# Patient Record
Sex: Female | Born: 1940 | Race: White | Hispanic: No | Marital: Single | Smoking: Former smoker
Health system: Southern US, Community
[De-identification: ages and names within clinical notes are randomized; demographics above are authoritative.]

## PROBLEM LIST (undated history)

## (undated) DIAGNOSIS — I10 Essential (primary) hypertension: Secondary | ICD-10-CM

## (undated) DIAGNOSIS — Z8719 Personal history of other diseases of the digestive system: Secondary | ICD-10-CM

## (undated) DIAGNOSIS — J449 Chronic obstructive pulmonary disease, unspecified: Secondary | ICD-10-CM

## (undated) DIAGNOSIS — I509 Heart failure, unspecified: Secondary | ICD-10-CM

## (undated) DIAGNOSIS — Z8711 Personal history of peptic ulcer disease: Secondary | ICD-10-CM

## (undated) DIAGNOSIS — C801 Malignant (primary) neoplasm, unspecified: Secondary | ICD-10-CM

## (undated) DIAGNOSIS — J189 Pneumonia, unspecified organism: Secondary | ICD-10-CM

## (undated) DIAGNOSIS — I2510 Atherosclerotic heart disease of native coronary artery without angina pectoris: Secondary | ICD-10-CM

## (undated) HISTORY — DX: Personal history of peptic ulcer disease: Z87.11

## (undated) HISTORY — DX: Pneumonia, unspecified organism: J18.9

## (undated) HISTORY — PX: LUNG REMOVAL, PARTIAL: SHX233

## (undated) HISTORY — DX: Personal history of other diseases of the digestive system: Z87.19

## (undated) HISTORY — DX: Heart failure, unspecified: I50.9

---

## 2008-09-12 ENCOUNTER — Emergency Department: Payer: Self-pay | Admitting: Internal Medicine

## 2010-09-08 ENCOUNTER — Emergency Department: Payer: Self-pay | Admitting: Emergency Medicine

## 2014-05-28 ENCOUNTER — Observation Stay: Payer: Self-pay | Admitting: Internal Medicine

## 2014-05-28 LAB — URINALYSIS, COMPLETE
Bacteria: NONE SEEN
Bilirubin,UR: NEGATIVE
GLUCOSE, UR: NEGATIVE mg/dL (ref 0–75)
KETONE: NEGATIVE
LEUKOCYTE ESTERASE: NEGATIVE
NITRITE: NEGATIVE
PH: 6 (ref 4.5–8.0)
Protein: NEGATIVE
RBC,UR: 2 /HPF (ref 0–5)
Specific Gravity: 1.013 (ref 1.003–1.030)
Squamous Epithelial: 9
WBC UR: 4 /HPF (ref 0–5)

## 2014-05-28 LAB — COMPREHENSIVE METABOLIC PANEL
ALBUMIN: 4 g/dL (ref 3.4–5.0)
ALK PHOS: 101 U/L
AST: 14 U/L — AB (ref 15–37)
Anion Gap: 8 (ref 7–16)
BUN: 6 mg/dL — ABNORMAL LOW (ref 7–18)
Bilirubin,Total: 0.8 mg/dL (ref 0.2–1.0)
CALCIUM: 8.5 mg/dL (ref 8.5–10.1)
CHLORIDE: 104 mmol/L (ref 98–107)
CO2: 27 mmol/L (ref 21–32)
Creatinine: 0.82 mg/dL (ref 0.60–1.30)
EGFR (African American): >60
GLUCOSE: 101 mg/dL — AB (ref 65–99)
Osmolality: 275 (ref 275–301)
Potassium: 3.5 mmol/L (ref 3.5–5.1)
SGPT (ALT): 17 U/L
SODIUM: 139 mmol/L (ref 136–145)
Total Protein: 7.8 g/dL (ref 6.4–8.2)

## 2014-05-28 LAB — CBC WITH DIFFERENTIAL/PLATELET
BASOS ABS: 0.2 10*3/uL — AB (ref 0.0–0.1)
Basophil %: 1.5 %
EOS ABS: 0.2 10*3/uL (ref 0.0–0.7)
Eosinophil %: 2.4 %
HCT: 42.5 % (ref 35.0–47.0)
HGB: 14 g/dL (ref 12.0–16.0)
LYMPHS PCT: 19.8 %
Lymphocyte #: 2.1 10*3/uL (ref 1.0–3.6)
MCH: 32.6 pg (ref 26.0–34.0)
MCHC: 33 g/dL (ref 32.0–36.0)
MCV: 99 fL (ref 80–100)
Monocyte #: 0.8 x10 3/mm (ref 0.2–0.9)
Monocyte %: 7.9 %
NEUTROS ABS: 7.2 10*3/uL — AB (ref 1.4–6.5)
Neutrophil %: 68.4 %
Platelet: 273 10*3/uL (ref 150–440)
RBC: 4.3 10*6/uL (ref 3.80–5.20)
RDW: 13.3 % (ref 11.5–14.5)
WBC: 10.5 10*3/uL (ref 3.6–11.0)

## 2014-05-28 LAB — TROPONIN I

## 2014-05-29 LAB — BASIC METABOLIC PANEL
ANION GAP: 6 — AB (ref 7–16)
BUN: 13 mg/dL (ref 7–18)
CHLORIDE: 100 mmol/L (ref 98–107)
Calcium, Total: 8.5 mg/dL (ref 8.5–10.1)
Co2: 28 mmol/L (ref 21–32)
Creatinine: 0.85 mg/dL (ref 0.60–1.30)
EGFR (African American): >60
Glucose: 95 mg/dL (ref 65–99)
Osmolality: 268 (ref 275–301)
POTASSIUM: 4 mmol/L (ref 3.5–5.1)
Sodium: 134 mmol/L — ABNORMAL LOW (ref 136–145)

## 2014-05-29 LAB — CBC WITH DIFFERENTIAL/PLATELET
Basophil #: 0.1 10*3/uL (ref 0.0–0.1)
Basophil %: 1.2 %
EOS ABS: 0.5 10*3/uL (ref 0.0–0.7)
EOS PCT: 5.7 %
HCT: 38.6 % (ref 35.0–47.0)
HGB: 13.1 g/dL (ref 12.0–16.0)
Lymphocyte #: 2.4 10*3/uL (ref 1.0–3.6)
Lymphocyte %: 27 %
MCH: 33.4 pg (ref 26.0–34.0)
MCHC: 34 g/dL (ref 32.0–36.0)
MCV: 99 fL (ref 80–100)
MONO ABS: 0.7 x10 3/mm (ref 0.2–0.9)
Monocyte %: 7.5 %
Neutrophil #: 5.1 10*3/uL (ref 1.4–6.5)
Neutrophil %: 58.6 %
PLATELETS: 219 10*3/uL (ref 150–440)
RBC: 3.92 10*6/uL (ref 3.80–5.20)
RDW: 13.6 % (ref 11.5–14.5)
WBC: 8.7 10*3/uL (ref 3.6–11.0)

## 2014-07-23 DIAGNOSIS — R0602 Shortness of breath: Secondary | ICD-10-CM | POA: Insufficient documentation

## 2014-07-23 DIAGNOSIS — R600 Localized edema: Secondary | ICD-10-CM | POA: Insufficient documentation

## 2014-07-23 DIAGNOSIS — I447 Left bundle-branch block, unspecified: Secondary | ICD-10-CM | POA: Insufficient documentation

## 2014-08-13 DIAGNOSIS — I429 Cardiomyopathy, unspecified: Secondary | ICD-10-CM | POA: Insufficient documentation

## 2015-02-13 NOTE — Discharge Summary (Signed)
PATIENT NAME:  Felicia Sellers, Felicia Sellers MR#:  [id number] DATE OF BIRTH:  [birth year]  DATE OF ADMISSION:  05/28/2014 DATE OF DISCHARGE:  05/30/2014  ADMITTING PHYSICIAN:  Dr. Vianne Sellers.   DISCHARGING PHYSICIAN:  Felicia Sellers, M.D.   PRIMARY CARE PHYSICIAN: The patient has no primary care physician prior to admission, however, at the time of discharge she is being set up with [facility] and an appointment is set up with Felicia Sellers, nurse practitioner.    [place]: None.   DISCHARGE DIAGNOSES:  1.  Fall and left 6th rib fracture.  2.  Hypertension.  3.  Tobacco use disorder.   4.  History of lung cancer status post partial right lobectomy.  DISCHARGE HOME MEDICATIONS:  1.  Norco 5/325 mg 1 to 2 tablets every 6 hours as needed for moderate pain.  2.  Senokot 2 tablets once a day for constipation while taking pain medications.  3.  Tylenol 500 mg q. 6 hours p.r.n. for pain.  4.  Norvasc 10 mg p.o. daily.   DISCHARGE DIET: Low-sodium diet.   DISCHARGE ACTIVITY: As tolerated.   FOLLOWUP INSTRUCTIONS:  1.  PCP followup in 2-3 weeks as set up.  2.  Home health physical therapy, nurse and social worker.  3.  Advised to check blood pressure on each visit per home health nurse, call MD if systolic blood pressure greater than 170 consistently or less than 100 so medication adjustments can be made.   LABORATORY DATA PRIOR TO DISCHARGE: WBC 8.7, hemoglobin 13.1, hematocrit 38.6, platelet count 219,000, sodium 134, potassium 4.0, chloride 100, bicarbonate 28, BUN 13, creatinine 0.85, glucose 95, calcium of 8.5.   IMAGING STUDIES PRIOR TO DISCHARGE:  1.  Left hip x-ray showing no acute osseous abnormality identified, moderate bilateral hip osteoarthrosis noted.   2.  CT of the head without contrast showing no acute intracranial abnormality, chronic small vessel ischemic disease and slight atrophy noted.  3.  Left ankle x-ray showing no acute bony abnormality to  the left ankle.  4.  Chest x-ray showing acute fracture of posterolateral aspect of the left 6th rib.   BRIEF HOSPITAL COURSE: [name] is a 74 year old elderly Caucasian female with no significant past medical history other than remote history of lung cancer, status post partial lobectomy of the right lung in the past, comes in after a fall.  She hit her left chest wall and had severe pain.  1.  Fall and left 6th rib fracture. She was admitted mostly for pain control and worked well with physical therapy. Pain was able to be controlled with oral Tylenol and Norco with which she is being discharged. She tolerated the medication okay. She was constipated so was also started on laxatives along with her pain medications.  2.  Hypertension. No known history of blood pressure issues, however, her blood pressure persistently was very, very high in the hospital, most likely secondary to pain. She was started on low-dose Norvasc and the dose was increased up to 10 mg at the time of discharge. She is being discharged on 10 mg of Norvasc but home health will be set up so they can follow up on her blood pressure and call MD for medication adjustments. Primary Care Physician has been set up prior to discharge.   The patient's course has been otherwise uneventful in the hospital.   DISCHARGE CONDITION: Stable.   DISCHARGE DISPOSITION: Home with home health.   Time spent on discharge: 45 minutes.  ____________________________ Felicia Lighter, MD rk:lt D: 05/31/2014 11:29:48 ET T: 05/31/2014 11:40:35 ET JOB#: [id number]  cc: Felicia Lighter, MD, <Dictator> Felicia Sellers. Felicia Severin, FNP Felicia Lighter MD ELECTRONICALLY SIGNED 06/09/2014 14:15

## 2015-02-13 NOTE — H&P (Signed)
PATIENT NAME:  Felicia, Sellers MR#:  [id number] DATE OF BIRTH:  Mar 07, 1941  DATE OF ADMISSION:  05/28/2014  PRIMARY DOCTOR: None.   EMERGENCY ROOM PHYSICIAN: Dr. Corky Downs.   CHIEF COMPLAINT: Fall and left-sided pleuritic chest pain.   HISTORY OF PRESENT ILLNESS: A 74 year old female patient who lives by herself comes because of the fall and suffered left hip pain and also left-sided chest wall pain. The patient states that she felt like she got up too fast and then fell. The patient denies any loss of consciousness, denies any dizziness. The patient noted to have left-sided leg pain and also left-sided chest pain. The patient complains of chest wall pain on the left side and is noted to have a left rib fracture. She also says that she has pain in the left hip and unable to ambulate. Her labs are within normal limits except fracture of the left 6th rib. She will be admitted for observation status for pain management and possible physical therapy evaluation.   PAST MEDICAL HISTORY: No hypertension or diabetes. She has a history of lung cancer status post surgery of  the right lung.   ALLERGIES: No known allergies.   SOCIAL HISTORY: Smokes half pack per day. No alcohol. No drugs. The patient lives by herself and she says that her nephew used to live with her but he moved out recently with his girlfriend. The patient says that she does not use a walker or a cane and able to ambulate without any assistance and take care of her daily needs. She mentioned that she had fallen once, but she does not think she needs any help at home. This time she says she cannot go home and have this much pain, so she would rather stay overnight and have more pain control.   PAST SURGICAL HISTORY: Significant for lung cancer surgery and history of appendectomy.   FAMILY HISTORY: No hypertension or diabetes. Says that all of her family is deceased and she does not have children.    MEDICATIONS: None.   REVIEW OF  SYSTEMS: CONSTITUTIONAL: No fever. No fatigue.  EYES: She has glasses.  ENT: No tinnitus. No epistaxis. No difficulty swallowing.  CARDIOVASCULAR: The patient has no chest pain. No palpitations. No syncope.  PULMONARY: The patient denies any trouble breathing. Denies any history of COPD.   GASTROINTESTINAL: Denies nausea or vomiting. No abdominal pain.  GENITOURINARY: No dysuria.  ENDOCRINE: No polyuria. No polydipsia.  MUSCULOSKELETAL: Complains of pleuritic chest pain on the left side and left hip pain.  NEUROLOGIC: Denies any strokes or TIAs.  PSYCHIATRIC: No anxiety or insomnia.   PHYSICAL EXAMINATION: VITAL SIGNS: Temperature 97.6, heart rate is 74, blood pressure is 201/98 initially and then repeat 190/90, saturation 100% on room air.  GENERAL: Alert, awake, oriented, elderly female in slight distress because of the pain in the chest wall and also hip pain.  HEAD: Atraumatic and normocephalic.  EYES: PERRLA. EOM intact.  ENT: No tympanic membrane congestion. No turbinate hypertrophy. No oropharyngeal erythema.  NECK: Supple. No JVD. No carotid bruit.  CARDIOVASCULAR: S1, S2 regular. No murmurs. The patient's PMI is not displaced. Peripheral pulses are intact in carotid and dorsalis pedis.  LUNGS: Clear to auscultation. No wheeze. No rales. Not using accessory muscle respiration.  GASTROINTESTINAL: Abdomen is soft, nontender, nondistended. Bowel sounds present. The patient has no hernias.  GENITOURINARY: Deferred.  MUSCULOSKELETAL: Complains of pain with minimal movement of the left hip and left knee area and also has chest wall pain  on the left side, but the rest of the extremity movements are within normal limits. The patient has no cyanosis, no clubbing.  LYMPHATICS: No lymphadenopathy in cervical or axillary region.  NEUROLOGIC: Alert, awake, and oriented. Cranial nerves II through XII intact. Power 5/5 in upper and lower extremities. Sensory intact. DTRs 2+ bilaterally.   SKIN:  Warm and dry.  PSYCHIATRIC: Mood and affect are within normal limits.   LABORATORY DATA: CT head shows no acute abnormality, chronic small vessel ischemic changes. Chest x-ray shows acute fracture of the posterolateral aspect of the left 6th rib. , hemoglobin 14, hematocrit 42.5, platelets 273,000. Electrolytes: Sodium 139, potassium 3.5, chloride 104, bicarbonate 27, BUN 6, creatinine 0.82, glucose 101. LFTs within normal limits. Troponin less than 0.02.  4. CT head negative for any acute changes. She has atrophy.   ASSESSMENT AND PLAN:  1.  The patient is a 74 year old female with fall, suffered acute rib fractures. Admit to observation status for pain control and pulmonary toilet and get physical therapy.  2.  Hip pain on the left side. Get x-ray of the left hip to evaluate for any fracture, and physical therapy to follow the patient. Continue pain medications.   3.  Malignant hypertension, on hydralazine and also pain medicines and see how she does.   4. Tobacco abuse, counseled against smoking. The patient does not want to quit and consult for 3 minutes.  5.  Gastrointestinal prophylaxis and deep vein thrombosis prophylaxis.   TIME SPENT: About 55 minutes on this admission.    ____________________________ Epifanio Lesches, MD sk:at D: 05/28/2014 11:37:58 ET T: 05/28/2014 12:31:08 ET JOB#: [id number]  cc: Epifanio Lesches, MD, <Dictator> Epifanio Lesches MD ELECTRONICALLY SIGNED 06/30/2014 13:10

## 2015-03-10 ENCOUNTER — Other Ambulatory Visit: Payer: Self-pay | Admitting: Family Medicine

## 2015-03-10 DIAGNOSIS — Z13820 Encounter for screening for osteoporosis: Secondary | ICD-10-CM

## 2015-03-17 ENCOUNTER — Other Ambulatory Visit: Payer: Self-pay | Admitting: Family Medicine

## 2015-03-17 DIAGNOSIS — Z13820 Encounter for screening for osteoporosis: Secondary | ICD-10-CM

## 2015-03-23 ENCOUNTER — Other Ambulatory Visit: Payer: Self-pay | Admitting: Family Medicine

## 2015-03-23 DIAGNOSIS — Z1231 Encounter for screening mammogram for malignant neoplasm of breast: Secondary | ICD-10-CM

## 2015-04-01 ENCOUNTER — Other Ambulatory Visit: Payer: Self-pay | Admitting: Family Medicine

## 2015-04-01 ENCOUNTER — Ambulatory Visit
Admission: RE | Admit: 2015-04-01 | Discharge: 2015-04-01 | Disposition: A | Discharge status: Home / Self Care | Payer: Medicare Other | Source: Ambulatory Visit | Attending: Family Medicine | Admitting: Family Medicine

## 2015-04-01 DIAGNOSIS — Z13820 Encounter for screening for osteoporosis: Secondary | ICD-10-CM

## 2015-04-01 DIAGNOSIS — Z1231 Encounter for screening mammogram for malignant neoplasm of breast: Secondary | ICD-10-CM | POA: Insufficient documentation

## 2015-04-01 DIAGNOSIS — M810 Age-related osteoporosis without current pathological fracture: Secondary | ICD-10-CM | POA: Diagnosis not present

## 2015-04-01 HISTORY — DX: Malignant (primary) neoplasm, unspecified: C80.1

## 2015-04-02 ENCOUNTER — Other Ambulatory Visit: Payer: Self-pay | Admitting: Family Medicine

## 2015-04-02 DIAGNOSIS — R928 Other abnormal and inconclusive findings on diagnostic imaging of breast: Secondary | ICD-10-CM

## 2015-04-28 ENCOUNTER — Ambulatory Visit
Admission: RE | Admit: 2015-04-28 | Discharge: 2015-04-28 | Disposition: A | Discharge status: Home / Self Care | Payer: Medicare Other | Source: Ambulatory Visit | Attending: Family Medicine | Admitting: Family Medicine

## 2015-04-28 DIAGNOSIS — N63 Unspecified lump in breast: Secondary | ICD-10-CM | POA: Insufficient documentation

## 2015-04-28 DIAGNOSIS — R928 Other abnormal and inconclusive findings on diagnostic imaging of breast: Secondary | ICD-10-CM

## 2015-06-18 DIAGNOSIS — M545 Low back pain: Secondary | ICD-10-CM

## 2015-06-18 DIAGNOSIS — G8929 Other chronic pain: Secondary | ICD-10-CM | POA: Insufficient documentation

## 2015-11-04 DIAGNOSIS — E871 Hypo-osmolality and hyponatremia: Secondary | ICD-10-CM

## 2015-11-04 HISTORY — DX: Hypo-osmolality and hyponatremia: E87.1

## 2016-04-03 ENCOUNTER — Other Ambulatory Visit: Payer: Self-pay | Admitting: Family Medicine

## 2016-04-03 DIAGNOSIS — Z13820 Encounter for screening for osteoporosis: Secondary | ICD-10-CM

## 2016-11-30 ENCOUNTER — Other Ambulatory Visit: Payer: Self-pay | Admitting: Family Medicine

## 2016-11-30 DIAGNOSIS — R928 Other abnormal and inconclusive findings on diagnostic imaging of breast: Secondary | ICD-10-CM

## 2016-12-11 ENCOUNTER — Ambulatory Visit: Admission: RE | Admit: 2016-12-11 | Payer: Medicare Other | Source: Ambulatory Visit

## 2016-12-19 ENCOUNTER — Ambulatory Visit
Admission: RE | Admit: 2016-12-19 | Discharge: 2016-12-19 | Disposition: A | Discharge status: Home / Self Care | Payer: Medicare Other | Source: Ambulatory Visit | Attending: Family Medicine | Admitting: Family Medicine

## 2016-12-19 DIAGNOSIS — Z13820 Encounter for screening for osteoporosis: Secondary | ICD-10-CM | POA: Diagnosis present

## 2016-12-19 DIAGNOSIS — M818 Other osteoporosis without current pathological fracture: Secondary | ICD-10-CM | POA: Diagnosis not present

## 2016-12-21 DIAGNOSIS — M810 Age-related osteoporosis without current pathological fracture: Secondary | ICD-10-CM | POA: Insufficient documentation

## 2017-01-05 ENCOUNTER — Ambulatory Visit
Admission: RE | Admit: 2017-01-05 | Discharge: 2017-01-05 | Disposition: A | Discharge status: Home / Self Care | Payer: Medicare Other | Source: Ambulatory Visit | Attending: Family Medicine | Admitting: Family Medicine

## 2017-01-05 DIAGNOSIS — R928 Other abnormal and inconclusive findings on diagnostic imaging of breast: Secondary | ICD-10-CM

## 2017-01-05 DIAGNOSIS — N6312 Unspecified lump in the right breast, upper inner quadrant: Secondary | ICD-10-CM | POA: Diagnosis not present

## 2017-01-05 DIAGNOSIS — N6324 Unspecified lump in the left breast, lower inner quadrant: Secondary | ICD-10-CM | POA: Insufficient documentation

## 2017-06-04 ENCOUNTER — Other Ambulatory Visit: Payer: Self-pay | Admitting: Family Medicine

## 2017-06-04 DIAGNOSIS — R928 Other abnormal and inconclusive findings on diagnostic imaging of breast: Secondary | ICD-10-CM

## 2017-06-05 ENCOUNTER — Other Ambulatory Visit: Payer: Self-pay | Admitting: Family Medicine

## 2017-06-05 DIAGNOSIS — R928 Other abnormal and inconclusive findings on diagnostic imaging of breast: Secondary | ICD-10-CM

## 2017-07-10 ENCOUNTER — Ambulatory Visit
Admission: RE | Admit: 2017-07-10 | Discharge: 2017-07-10 | Disposition: A | Discharge status: Home / Self Care | Payer: Medicare Other | Source: Ambulatory Visit | Attending: Family Medicine | Admitting: Family Medicine

## 2017-07-10 DIAGNOSIS — R928 Other abnormal and inconclusive findings on diagnostic imaging of breast: Secondary | ICD-10-CM | POA: Diagnosis not present

## 2017-12-06 ENCOUNTER — Other Ambulatory Visit: Payer: Self-pay | Admitting: Family Medicine

## 2017-12-06 DIAGNOSIS — R928 Other abnormal and inconclusive findings on diagnostic imaging of breast: Secondary | ICD-10-CM

## 2018-01-07 ENCOUNTER — Ambulatory Visit
Admission: RE | Admit: 2018-01-07 | Discharge: 2018-01-07 | Disposition: A | Discharge status: Home / Self Care | Payer: Medicare Other | Source: Ambulatory Visit | Attending: Family Medicine | Admitting: Family Medicine

## 2018-01-07 DIAGNOSIS — R928 Other abnormal and inconclusive findings on diagnostic imaging of breast: Secondary | ICD-10-CM

## 2018-06-25 ENCOUNTER — Other Ambulatory Visit: Payer: Self-pay | Admitting: Family Medicine

## 2018-06-25 DIAGNOSIS — R1013 Epigastric pain: Secondary | ICD-10-CM

## 2018-06-25 DIAGNOSIS — R1010 Upper abdominal pain, unspecified: Secondary | ICD-10-CM

## 2018-06-25 DIAGNOSIS — R1011 Right upper quadrant pain: Secondary | ICD-10-CM

## 2018-07-03 ENCOUNTER — Ambulatory Visit: Payer: Medicare Other

## 2018-07-08 ENCOUNTER — Ambulatory Visit
Admission: RE | Admit: 2018-07-08 | Discharge: 2018-07-08 | Disposition: A | Discharge status: Home / Self Care | Payer: Medicare Other | Source: Ambulatory Visit | Attending: Family Medicine | Admitting: Family Medicine

## 2018-07-08 DIAGNOSIS — R1011 Right upper quadrant pain: Secondary | ICD-10-CM

## 2018-07-08 DIAGNOSIS — I714 Abdominal aortic aneurysm, without rupture: Secondary | ICD-10-CM | POA: Diagnosis not present

## 2018-07-08 DIAGNOSIS — R1013 Epigastric pain: Secondary | ICD-10-CM | POA: Diagnosis present

## 2018-07-08 DIAGNOSIS — R1010 Upper abdominal pain, unspecified: Secondary | ICD-10-CM

## 2018-07-17 ENCOUNTER — Other Ambulatory Visit: Payer: Self-pay

## 2018-07-17 ENCOUNTER — Other Ambulatory Visit: Payer: Self-pay | Admitting: Family Medicine

## 2018-07-17 DIAGNOSIS — I714 Abdominal aortic aneurysm, without rupture, unspecified: Secondary | ICD-10-CM

## 2018-07-24 ENCOUNTER — Other Ambulatory Visit
Admission: RE | Admit: 2018-07-24 | Discharge: 2018-07-24 | Disposition: A | Discharge status: Home / Self Care | Payer: Medicare Other | Source: Ambulatory Visit | Attending: Family Medicine | Admitting: Family Medicine

## 2018-07-24 ENCOUNTER — Ambulatory Visit
Admission: RE | Admit: 2018-07-24 | Discharge: 2018-07-24 | Disposition: A | Discharge status: Home / Self Care | Payer: Medicare Other | Source: Ambulatory Visit | Attending: Family Medicine | Admitting: Family Medicine

## 2018-07-24 DIAGNOSIS — K5790 Diverticulosis of intestine, part unspecified, without perforation or abscess without bleeding: Secondary | ICD-10-CM | POA: Diagnosis not present

## 2018-07-24 DIAGNOSIS — I714 Abdominal aortic aneurysm, without rupture, unspecified: Secondary | ICD-10-CM

## 2018-07-24 DIAGNOSIS — N83291 Other ovarian cyst, right side: Secondary | ICD-10-CM | POA: Diagnosis not present

## 2018-07-24 DIAGNOSIS — I708 Atherosclerosis of other arteries: Secondary | ICD-10-CM | POA: Insufficient documentation

## 2018-07-24 DIAGNOSIS — N83292 Other ovarian cyst, left side: Secondary | ICD-10-CM | POA: Diagnosis not present

## 2018-07-24 HISTORY — DX: Essential (primary) hypertension: I10

## 2018-07-24 LAB — BASIC METABOLIC PANEL
Anion gap: 7 (ref 5–15)
BUN: 15 mg/dL (ref 8–23)
CALCIUM: 9 mg/dL (ref 8.9–10.3)
CO2: 24 mmol/L (ref 22–32)
CREATININE: 0.98 mg/dL (ref 0.44–1.00)
Chloride: 101 mmol/L (ref 98–111)
GFR calc non Af Amer: 53 mL/min — ABNORMAL LOW (ref >60)
Glucose, Bld: 88 mg/dL (ref 70–99)
Potassium: 4 mmol/L (ref 3.5–5.1)
SODIUM: 132 mmol/L — AB (ref 135–145)

## 2018-07-24 MED ORDER — IOPAMIDOL (ISOVUE-370) INJECTION 76%
100.0000 mL | Freq: Once | INTRAVENOUS | Status: AC | PRN
  Administered 2018-07-24: 100 mL via INTRAVENOUS

## 2018-08-06 ENCOUNTER — Encounter (INDEPENDENT_AMBULATORY_CARE_PROVIDER_SITE_OTHER): Payer: Medicare Other | Admitting: Vascular Surgery

## 2018-08-06 NOTE — Progress Notes (Signed)
Gynecologic Oncology Consult Visit   Referring Provider: Zeb Comfort, MD [address] [place], [place] [address] [phone]  Chief Concern: Bilateral ovarian masses  Subjective:  Felicia Sellers is a 77 y.o. female who is seen in consultation from Dr. Pricilla Riffle for bilateral ovarian masses.  US Abdomen 07/08/2018 IMPRESSION: 1. Tiny stone versus tumefactive sludge within the gallbladder. Negative for acute cholecystitis or biliary dilatation 2. 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 Recommend CTA to more thoroughly evaluate the aorta.  CT scan 07/24/2018 Bilateral heterogeneous partially cystic and septated ovarian masses. Left ovarian complex mass measures 10.6 x 6.1 cm. Right ovarian mass measures 4.5 x 2.8 cm. No ascites or evidence of peritoneal disease by CT.   Atherosclerotic disease involving celiac, SMA, renals, IMA, and iliac vessels.   Diverticulosis without acute inflammatory process  Today she complains of weight gain and abdominal pressure. She continues to smoke and reports history of lung cancer but has not seen pulmonology in 'long time; can't even remember his name'. She is scheduled to see vascular, Dr. Lucky Cowboy, for thoracic aneurysm and other findings on ct scan. She previously drank alcohol daily but quit ~ 6 months ago. She has chronic shortness of breath and leg pain with walking. She has chronic cough. She has history of kyphosis but reports chronic back and joint pain.   Problem List: Patient Active Problem List   Diagnosis Date Noted  . Kyphosis 08/07/2018  . Hypertension 08/07/2018  . Tobacco use 08/07/2018  . Age-related osteoporosis without current pathological fracture 12/21/2016  . Chronic hyponatremia 11/04/2015  . Chronic low back pain 06/18/2015  . Cardiomyopathy ([place]) 08/13/2014  . Left bundle branch block (LBBB) 07/23/2014  . Pedal edema 07/23/2014  . SOB (shortness of breath) on exertion  07/23/2014    Past Medical History: Past Medical History:  Diagnosis Date  . Cancer ([place])    right lung removed - lung cancer  . History of stomach ulcers   . Hypertension     Past Surgical History: Past Surgical History:  Procedure Laterality Date  . LUNG REMOVAL, PARTIAL Left     Past Gynecologic History:  G1P4 Distant history of birth control pill use Denies history of STIs or abnormal pap smears  OB History:  OB History  No data available    Family History: Family History  Problem Relation Age of Onset  . Breast cancer Neg Hx     Social History: Social History   Socioeconomic History  . Marital status: Single    Spouse name: Not on file  . Number of children: Not on file  . Years of education: Not on file  . Highest education level: Not on file  Occupational History  . Not on file  Social Needs  . Financial resource strain: Not on file  . Food insecurity:    Worry: Not on file    Inability: Not on file  . Transportation needs:    Medical: Not on file    Non-medical: Not on file  Tobacco Use  . Smoking status: Current Every Day Smoker    Packs/day: 1.00    Years: 66.00    Pack years: 66.00  . Smokeless tobacco: Never Used  Substance and Sexual Activity  . Alcohol use: Not Currently    Comment: use to drink alot ( vodka) 6 months ago  . Drug use: Never  . Sexual activity: Not Currently  Lifestyle  . Physical activity:  Days per week: Not on file    Minutes per session: Not on file  . Stress: Not on file  Relationships  . Social connections:    Talks on phone: Not on file    Gets together: Not on file    Attends religious service: Not on file    Active member of club or organization: Not on file    Attends meetings of clubs or organizations: Not on file    Relationship status: Not on file  . Intimate partner violence:    Fear of current or ex partner: Not on file    Emotionally abused: Not on file    Physically abused: Not on file     Forced sexual activity: Not on file  Other Topics Concern  . Not on file  Social History Narrative  . Not on file    Allergies: No Known Allergies  Current Medications: Current Outpatient Medications  Medication Sig Dispense Refill  . Albuterol Sulfate 108 (90 Base) MCG/ACT AEPB Inhale 2 puffs into the lungs every 6 (six) hours as needed.    Marland Kitchen alendronate (FOSAMAX) 70 MG tablet Take 70 mg by mouth once a week. Take with a full glass of water on an empty stomach.    Marland Kitchen amLODipine (NORVASC) 10 MG tablet Take 10 mg by mouth daily.    Marland Kitchen atorvastatin (LIPITOR) 40 MG tablet Take 40 mg by mouth daily.    . calcium carbonate (OS-CAL - DOSED IN MG OF ELEMENTAL CALCIUM) 1250 (500 Ca) MG tablet Take 1 tablet by mouth.    . Cholecalciferol 1000 units capsule Take 1,000 Units by mouth daily.    Marland Kitchen docusate sodium (COLACE) 100 MG capsule Take 100 mg by mouth daily.    Marland Kitchen gabapentin (NEURONTIN) 300 MG capsule Take 300 mg by mouth 3 (three) times daily.    Marland Kitchen lisinopril (PRINIVIL,ZESTRIL) 5 MG tablet Take 5 mg by mouth daily.    . metoprolol succinate (TOPROL-XL) 25 MG 24 hr tablet Take 25 mg by mouth daily.    . pantoprazole (PROTONIX) 40 MG tablet Take 40 mg by mouth daily.     No current facility-administered medications for this visit.     Review of Systems General: negative for fevers, chills, fatigue, changes in sleep, or appetite Skin: negative for changes in color, texture, moles or lesions Eyes: negative for changes in vision, pain, diplopia HEENT: negative for change in hearing, pain, discharge, tinnitus, vertigo, voice changes, sore throat, neck masses Breasts: negative for breast lumps Pulmonary: negative for dyspnea, orthopnea. Hx of partial lung removal. Chronic cough & COPD Cardiac: negative for palpitations, syncope, pain, discomfort, pressure.  Gastrointestinal: negative for dysphagia, nausea, vomiting, jaundice, pain, constipation, diarrhea, hematemesis,  hematochezia Genitourinary/Sexual: negative for dysuria, discharge, hesitancy, nocturia, retention, stones, infections, STD's, incontinence Ob/Gyn: negative for irregular bleeding, pain Musculoskeletal: negative for pain, stiffness, swelling. Chronic limited ROM in neck and upper back.  Hematology: negative for easy bruising, bleeding Neurologic/Psych: negative for headaches, seizures, paralysis, weakness, tremor, change in gait, change in sensation, mood swings, depression, anxiety, change in memory   Objective:  Physical Examination:  BP 115/70   Pulse 75   Temp (!) 96.7 F (35.9 C) (Tympanic)   Resp 18   Ht 5\' 3"  (1.6 m)   Wt 136 lb 8 oz (61.9 kg)   BMI 24.18 kg/m    ECOG Performance Status: 2 - Symptomatic, <50% confined to bed  GENERAL: chronically ill appearing, no acute distress. Unaccompanied.  HEENT:  Sclerae  anicteric.  Oropharynx clear and moist. No ulcerations or evidence of oropharyngeal candidiasis. Neck is supple.  NODES:  No cervical, supraclavicular, or axillary lymphadenopathy palpated.  LUNGS:  Bilateral rhonchi, worse lung bases HEART:  Regular rate and rhythm. No murmur appreciated. ABDOMEN:  Soft, nontender.  Positive, normoactive bowel sounds. No organomegaly palpated. MSK:  No focal spinal tenderness to palpation. Full range of motion bilaterally in the upper extremities. Kyphosis.  EXTREMITIES:  No peripheral edema.   SKIN:  Clear with no obvious rashes or skin changes. No nail dyscrasia. NEURO:  Nonfocal. Well oriented.  Appropriate affect.  Pelvic: exam chaperoned by nurse;  Vulva: normal appearing vulva with no masses, tenderness or lesions; Vagina: normal vagina; Adnexa: right adnexal fullness and mass present left side, size firm and 5-6 cm along the base of the mass, unable to palpate the entire lesion; Uterus: uterus is normal size, shape, consistency and nontender; Cervix: no lesions; Rectal: confirmatory  Lab Review Labs on site today: CA-125 and  HE4 - pending   Radiologic Imaging: As per HPI    Assessment:  Felicia Sellers is a 78 y.o. female diagnosed with symptomatic bilateral adnexal masses concerning for malignancy (Partially cystic and septated ovarian masses. Left ovarian complex mass measures 10.6 x 6.1 cm. Right ovarian mass measures 4.5 x 2.8 cm). No ascites which is reassuring. She has significant co-morbid conditions and is not a good surgical candidate.   Medical co-morbidities complicating care: HTN, Tobacco use, Cardiomyopathy ([place]), s/p lung resection, diffuse atherosclerosis, and abdominal aortic aneurysm up to 4.6 cm.  Plan:   Problem List Items Addressed This Visit    None    Visit Diagnoses    Ovarian mass    -  Primary   Relevant Orders   CA 125   Human Epididymis Prot 4,Serial   CT GUIDED NEEDLE PLACEMENT      We discussed options for management and given that she is a poor surgical candidate plan for tumor marker assessment and CT guided biopsy. If malignant plan for chemotherapy. If not malignant will need to discuss surgical intervention and address her co-morbid conditions. She will follow up with her other care providers regarding her other health problems.   Suggested return to clinic in  1-2 weeks after we have results.   The patient's diagnosis, an outline of the further diagnostic and laboratory studies which will be required, the recommendation, and alternatives were discussed.  All questions were answered to the patient's satisfaction.  I personally had a face to face interaction and evaluated the patient jointly with the NP, Ms. Beckey Rutter.  I have reviewed her history and available records and have performed the key portions of the physical exam including lymph node survey, abdominal exam, pelvic exam with my findings confirming those documented above by the APP.  I have discussed the case with the APP and the patient.  I agree with the above documentation, assessment and plan which was fully  formulated by me.  Counseling was completed by me.   I personally saw the patient and performed a substantive portion of this encounter in conjunction with the listed APP as documented above. A total of 80 minutes were spent with the patient/family today; 50% was spent in education, counseling and coordination of care for symptomatic bilateral adnexal masses concerning for malignancy.   Kasean Denherder Gaetana Michaelis, MD      CC:  Zeb Comfort, MD [address] [place], [place] [address] [phone]

## 2018-08-07 ENCOUNTER — Inpatient Hospital Stay: Payer: Medicare Other | Attending: Obstetrics and Gynecology | Admitting: Obstetrics and Gynecology

## 2018-08-07 ENCOUNTER — Inpatient Hospital Stay: Payer: Medicare Other

## 2018-08-07 VITALS — BP 115/70 | HR 75 | Temp 96.7°F | Resp 18 | Ht 63.0 in | Wt 136.5 lb

## 2018-08-07 DIAGNOSIS — Z1273 Encounter for screening for malignant neoplasm of ovary: Secondary | ICD-10-CM | POA: Diagnosis not present

## 2018-08-07 DIAGNOSIS — D3912 Neoplasm of uncertain behavior of left ovary: Secondary | ICD-10-CM | POA: Diagnosis present

## 2018-08-07 DIAGNOSIS — F17210 Nicotine dependence, cigarettes, uncomplicated: Secondary | ICD-10-CM | POA: Insufficient documentation

## 2018-08-07 DIAGNOSIS — Z85118 Personal history of other malignant neoplasm of bronchus and lung: Secondary | ICD-10-CM | POA: Diagnosis not present

## 2018-08-07 DIAGNOSIS — D3911 Neoplasm of uncertain behavior of right ovary: Secondary | ICD-10-CM | POA: Insufficient documentation

## 2018-08-07 DIAGNOSIS — R971 Elevated cancer antigen 125 [CA 125]: Secondary | ICD-10-CM | POA: Insufficient documentation

## 2018-08-07 DIAGNOSIS — Z720 Tobacco use: Secondary | ICD-10-CM | POA: Insufficient documentation

## 2018-08-07 DIAGNOSIS — N838 Other noninflammatory disorders of ovary, fallopian tube and broad ligament: Secondary | ICD-10-CM

## 2018-08-07 DIAGNOSIS — I10 Essential (primary) hypertension: Secondary | ICD-10-CM | POA: Insufficient documentation

## 2018-08-07 DIAGNOSIS — C801 Malignant (primary) neoplasm, unspecified: Secondary | ICD-10-CM | POA: Insufficient documentation

## 2018-08-07 DIAGNOSIS — M40209 Unspecified kyphosis, site unspecified: Secondary | ICD-10-CM | POA: Insufficient documentation

## 2018-08-07 NOTE — Progress Notes (Signed)
Pt here as new patient. She has some abdominal pain and feels like she has gained wt esp. In her stomach, her wt. Has not changed in years and she is not eating differently but has gained wt. She was always around 119 lbs.

## 2018-08-07 NOTE — Progress Notes (Signed)
Invasive checklist sent to special scheduling. Denies blood thinners.

## 2018-08-08 LAB — CA 125: CANCER ANTIGEN (CA) 125: 100 U/mL — AB (ref 0.0–38.1)

## 2018-08-09 ENCOUNTER — Telehealth: Payer: Self-pay

## 2018-08-09 NOTE — Telephone Encounter (Signed)
Called and spoke with Felicia Sellers regarding request for CT guided biopsy. He recommends this case be presented to the MDT. They do not frequently biopsy ovaries. There is the risk of getting negative tissue and risk of bleeding. Discussed plan of care as documented and discussed during her Gyn Onc consult. He will discuss her case further with his IR team and I have placed her on the next MDT, 10/24. I have called and updated Felicia Sellers. Oncology Nurse Navigator Documentation  Navigator Location: [facility] (08/09/18 0800)   )Navigator Encounter Type: Telephone (08/09/18 0800) Telephone: Lahoma Crocker Call;Diagnostic Results;Patient Update;Clinic/MDC Follow-up (08/09/18 0800)                                                  Time Spent with Patient: 30 (08/09/18 0800)

## 2018-08-12 ENCOUNTER — Other Ambulatory Visit: Payer: Self-pay

## 2018-08-15 DIAGNOSIS — N838 Other noninflammatory disorders of ovary, fallopian tube and broad ligament: Secondary | ICD-10-CM

## 2018-08-15 NOTE — Progress Notes (Signed)
Felicia Sellers was further discussed today with IR at the MDT conference. They would like a PET if possible to better differentiate best area to biopsy. There is concern for spillage and sampling error. PET will assist with this. I have spoken with Dr. [name] and we will arrange for PET scan. Felicia Sellers has transportation issues and will need advance notice to arrange transportation. We will arrange PET for one day next week. I will also find out if IR will use any type of sedation for her biopsy as she does not have any transportation outside of using public transport. Oncology Nurse Navigator Documentation  Navigator Location: [facility] (08/15/18 1300)   )Navigator Encounter Type: Telephone;Other (08/15/18 1300) Telephone: Outgoing Call;Patient Update (08/15/18 1300)                                                  Time Spent with Patient: 30 (08/15/18 1300)

## 2018-08-16 ENCOUNTER — Encounter (INDEPENDENT_AMBULATORY_CARE_PROVIDER_SITE_OTHER): Payer: Self-pay | Admitting: Vascular Surgery

## 2018-08-16 ENCOUNTER — Ambulatory Visit (INDEPENDENT_AMBULATORY_CARE_PROVIDER_SITE_OTHER): Payer: Medicare Other | Admitting: Vascular Surgery

## 2018-08-16 ENCOUNTER — Telehealth: Payer: Self-pay

## 2018-08-16 VITALS — BP 128/71 | HR 66 | Resp 16 | Ht 63.0 in | Wt 136.0 lb

## 2018-08-16 DIAGNOSIS — I712 Thoracic aortic aneurysm, without rupture, unspecified: Secondary | ICD-10-CM

## 2018-08-16 DIAGNOSIS — F17210 Nicotine dependence, cigarettes, uncomplicated: Secondary | ICD-10-CM | POA: Diagnosis not present

## 2018-08-16 DIAGNOSIS — I10 Essential (primary) hypertension: Secondary | ICD-10-CM | POA: Diagnosis not present

## 2018-08-16 DIAGNOSIS — Z720 Tobacco use: Secondary | ICD-10-CM

## 2018-08-16 NOTE — Assessment & Plan Note (Signed)
I have independently reviewed the CT scan.  From a non-vascular standpoint, there are ovarian cystic structures that are worrisome for malignancy and apparently there were also some bladder issues.  From a vascular standpoint which is why she was referred here, the uppermost portion of the CT scan of the abdomen shows a 5 cm thoracic aortic aneurysm with significant amount of mural thrombus.  At the level of the visceral vessels, the aorta tapers down to a reasonably normal caliber of about 2.9 cm in diameter.  The upper extent of this thoracic aortic aneurysm was not well seen.  The abdominal aorta was not aneurysmal but it did have atherosclerotic changes.  She also had iliac and femoral atherosclerotic changes. We do not know the full extent of her thoracic aortic aneurysm, but the visualized portion would be a moderate aneurysm that would not need emergent repair.  I have ordered a CT scan of the chest and this will be performed and she will return to discuss the results and further treatment options.  She also has an ongoing evaluation for potential malignancy in her pelvis, and this can continue.  I discussed the importance of blood pressure control and smoking cessation.  We discussed the natural history and pathophysiology of aortic aneurysmal disease.  I will see her back following her scan.

## 2018-08-16 NOTE — Telephone Encounter (Signed)
Called and notified [name] of date/time/instructions for PET. Read back performed. Oncology Nurse Navigator Documentation  Navigator Location: [facility] (08/16/18 0900)   )Navigator Encounter Type: Telephone (08/16/18 0900) Telephone: Lahoma Crocker Call;Appt Confirmation/Clarification (08/16/18 0900)                                                  Time Spent with Patient: 15 (08/16/18 0900)

## 2018-08-16 NOTE — Progress Notes (Signed)
Patient ID: Geniva Lohnes, female   DOB: February 16, 1941, 77 y.o.   MRN: [id number]  Chief Complaint  Patient presents with  . New Patient (Initial Visit)    ref [facility] for AAA    HPI Tamirra Sienkiewicz is a 77 y.o. female.  I am asked to see the patient by Dr. Pricilla Riffle for evaluation of aortic aneurysm.  The patient reports having this CT scan for lower abdominal pain.  There were multiple findings on the CT scan and I have independently reviewed the CT scan.  From a non-vascular standpoint, there are ovarian cystic structures that are worrisome for malignancy and apparently there were also some bladder issues.  From a vascular standpoint which is why she was referred here, the uppermost portion of the CT scan of the abdomen shows a 5 cm thoracic aortic aneurysm with significant amount of mural thrombus.  At the level of the visceral vessels, the aorta tapers down to a reasonably normal caliber of about 2.9 cm in diameter.  The upper extent of this thoracic aortic aneurysm was not well seen.  The abdominal aorta was not aneurysmal but it did have atherosclerotic changes.  She also had iliac and femoral atherosclerotic changes.  She has ongoing tobacco use and knows she needs to quit.  She does not have a family history of aneurysm that she knows of.  She does have a history of hypertension and a lung resection for lung cancer many years ago   Past Medical History:  Diagnosis Date  . Cancer ([place])    right lung removed - lung cancer  . History of stomach ulcers   . Hypertension     Past Surgical History:  Procedure Laterality Date  . LUNG REMOVAL, PARTIAL Left     Family History  Problem Relation Age of Onset  . Breast cancer Neg Hx   No bleeding disorders, clotting disorders, or aneurysms  Social History Social History   Tobacco Use  . Smoking status: Current Every Day Smoker    Packs/day: 1.00    Years: 66.00    Pack years: 66.00  . Smokeless tobacco: Never Used  Substance Use Topics    . Alcohol use: Not Currently    Comment: use to drink alot ( vodka) 6 months ago  . Drug use: Never    No Known Allergies  Current Outpatient Medications  Medication Sig Dispense Refill  . Albuterol Sulfate 108 (90 Base) MCG/ACT AEPB Inhale 2 puffs into the lungs every 6 (six) hours as needed.    Marland Kitchen alendronate (FOSAMAX) 70 MG tablet Take 70 mg by mouth once a week. Take with a full glass of water on an empty stomach.    Marland Kitchen amLODipine (NORVASC) 10 MG tablet Take 10 mg by mouth daily.    Marland Kitchen atorvastatin (LIPITOR) 40 MG tablet Take 40 mg by mouth daily.    . calcium carbonate (OS-CAL - DOSED IN MG OF ELEMENTAL CALCIUM) 1250 (500 Ca) MG tablet Take 1 tablet by mouth.    . Cholecalciferol 1000 units capsule Take 1,000 Units by mouth daily.    Marland Kitchen docusate sodium (COLACE) 100 MG capsule Take 100 mg by mouth daily.    Marland Kitchen gabapentin (NEURONTIN) 300 MG capsule Take 300 mg by mouth 3 (three) times daily.    Marland Kitchen lisinopril (PRINIVIL,ZESTRIL) 5 MG tablet Take 5 mg by mouth daily.    . metoprolol succinate (TOPROL-XL) 25 MG 24 hr tablet Take 25 mg by mouth daily.    Marland Kitchen  pantoprazole (PROTONIX) 40 MG tablet Take 40 mg by mouth daily.     No current facility-administered medications for this visit.       REVIEW OF SYSTEMS (Negative unless checked)  Constitutional: [] Weight loss  [] Fever  [] Chills Cardiac: [] Chest pain   [] Chest pressure   [] Palpitations   [] Shortness of breath when laying flat   [] Shortness of breath at rest   [x] Shortness of breath with exertion. Vascular:  [] Pain in legs with walking   [] Pain in legs at rest   [] Pain in legs when laying flat   [] Claudication   [] Pain in feet when walking  [] Pain in feet at rest  [] Pain in feet when laying flat   [] History of DVT   [] Phlebitis   [] Swelling in legs   [] Varicose veins   [] Non-healing ulcers Pulmonary:   [] Uses home oxygen   [] Productive cough   [] Hemoptysis   [] Wheeze  [] COPD   [] Asthma Neurologic:  [] Dizziness  [] Blackouts   [] Seizures    [] History of stroke   [] History of TIA  [] Aphasia   [] Temporary blindness   [] Dysphagia   [] Weakness or numbness in arms   [] Weakness or numbness in legs Musculoskeletal:  [x] Arthritis   [] Joint swelling   [] Joint pain   [] Low back pain Hematologic:  [] Easy bruising  [] Easy bleeding   [] Hypercoagulable state   [] Anemic  [] Hepatitis Gastrointestinal:  [] Blood in stool   [] Vomiting blood  [] Gastroesophageal reflux/heartburn   [] Abdominal pain Genitourinary:  [] Chronic kidney disease   [] Difficult urination  [] Frequent urination  [] Burning with urination   [] Hematuria Skin:  [] Rashes   [] Ulcers   [] Wounds Psychological:  [] History of anxiety   []  History of major depression.    Physical Exam BP 128/71 (BP Location: Right Arm)   Pulse 66   Resp 16   Ht 5' 3"  (1.6 m)   Wt 136 lb (61.7 kg)   BMI 24.09 kg/m  Gen:  WD/WN, NAD Head: [place]/AT, No temporalis wasting.  Ear/Nose/Throat: Hearing grossly intact, nares w/o erythema or drainage, oropharynx w/o Erythema/Exudate Eyes: Conjunctiva clear, sclera non-icteric  Neck: trachea midline.  No bruit or JVD.  Pulmonary:  Good air movement, few rhonchi bilaterally Cardiac: RRR, no JVD Vascular:  Vessel Right Left  Radial Palpable Palpable                          PT Not Palpable Not Palpable  DP Trace Palpable 1+ Palpable   Gastrointestinal: soft, non-tender/non-distended.  No increased aortic impulse Musculoskeletal: M/S 5/5 throughout.  Extremities without ischemic changes.  No deformity or atrophy. Neurologic: Sensation grossly intact in extremities.  Symmetrical.  Speech is fluent. Motor exam as listed above. Psychiatric: Judgment intact, Mood & affect appropriate for pt's clinical situation. Dermatologic: No rashes or ulcers noted.  No cellulitis or open wounds.    Radiology Ct Angio Abd/pel W/ And/or W/o  Result Date: 07/24/2018 CLINICAL DATA:  Abdominal aortic aneurysm by ultrasound EXAM: CT ANGIOGRAPHY ABDOMEN AND PELVIS WITH  CONTRAST AND WITHOUT CONTRAST TECHNIQUE: Multidetector CT imaging of the abdomen and pelvis was performed using the standard protocol during bolus administration of intravenous contrast. Multiplanar reconstructed images and MIPs were obtained and reviewed to evaluate the vascular anatomy. CONTRAST:  113m ISOVUE-370 IOPAMIDOL (ISOVUE-370) INJECTION 76% COMPARISON:  07/08/2018 ultrasound FINDINGS: VASCULAR Aorta: The distal descending thoracic aorta above the diaphragmatic hiatus is aneurysmally dilated measuring 5 x 4.8 cm, image 7 series 4 with eccentric mural thrombus. Abdominal aorta is  heavily atherosclerotic, tortuous with scattered areas of ectasia. Suprarenal aorta measures 2.8 cm in diameter at the level of the celiac artery. Infrarenal aorta has a maximal diameter of 2.4 cm transverse, image 63 series 4. No acute vascular process including dissection or retroperitoneal hematoma. No evidence of rupture. Celiac: Atherosclerotic origin with ostial narrowing, estimated greater than 50%. Celiac branches are heavily calcified. Left gastric and hepatic vasculature remain patent. 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 SMA: Atherosclerotic origin with minor narrowing but without occlusion. SMA and its branches remains patent. Renals: Atherosclerotic origins without significant stenosis or occlusion. No accessory renal artery. IMA: Occluded off the distal aorta. There is some reconstitution of the peripheral IMA branches via SMA collateral pathways Inflow: Iliac inflow is patent but heavily calcified and tortuous. Common, internal and external iliac arteries demonstrate luminal irregularities without occlusion significant stenosis. Proximal Outflow: Common femoral and proximal profunda femoral artery remain patent. There is chronic occlusion of the proximal SFAs bilaterally. Veins: No significant veno-occlusive process. Review of  the MIP images confirms the above findings. NON-VASCULAR Lower chest: Basilar atelectasis versus scarring. No pericardial pleural effusion. Degenerative changes of the lower thoracic spine. Hepatobiliary: Scattered calcified granulomas. No other focal hepatic abnormality or biliary dilatation. Hepatic and portal veins are patent. Gallbladder biliary system unremarkable. Common bile duct nondilated. Pancreas: Unremarkable. No pancreatic ductal dilatation or surrounding inflammatory changes. Spleen: Splenic granulomata noted. Spleen is normal in size. No focal abnormality. Adrenals/Urinary Tract: Normal adrenal glands. Renal cortical scarring noted. Small subcentimeter cortical cysts. No hydronephrosis, hydroureter, or obstructing ureteral calculus. Urinary bladder unremarkable. Stomach/Bowel: Duodenal diverticulum incidentally noted. Negative for bowel obstruction, significant dilatation, ileus, or free air. Appendix not visualized. Scattered colonic diverticulosis. No acute inflammatory process. No fluid collection or abscess. Lymphatic: No adenopathy. Reproductive: Bilateral heterogeneous partially cystic and septated ovarian masses. Left ovarian complex mass measures 10.6 x 6.1 cm. Right ovarian mass measures 4.5 x 2.8 cm. 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 No free fluid, ascites, or evidence of peritoneal disease. Uterus is atrophic. Other: No abdominal wall hernia or abnormality. No abdominopelvic ascites. Musculoskeletal: Bones are osteopenic. Degenerative changes of the spine with associated scoliosis. 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 This likely accounts for the ultrasound finding. Consider nonemergent complete imaging of the chest with CTA. No significant abdominal aortic aneurysm. Extensive abdominal atherosclerosis without acute vascular process  as detailed above. 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 Recommend nonemergent GYN referral for further evaluation in this postmenopausal female. No ascites or evidence of peritoneal disease by CT. Diverticulosis without acute inflammatory process These results will be called to the ordering clinician or representative by the Radiologist Assistant, and communication documented in the PACS or zVision Dashboard. Electronically Signed   By: Jerilynn Mages.  Shick M.D.   On: 07/24/2018 15:53    Labs Recent Results (from the past 2160 hour(s))  Basic metabolic panel     Status: Abnormal   Collection Time: 07/24/18  9:57 AM  Result Value Ref Range   Sodium 132 (L) 135 - 145 mmol/L   Potassium 4.0 3.5 - 5.1 mmol/L   Chloride 101 98 - 111 mmol/L   CO2 24 22 - 32 mmol/L   Glucose, Bld 88 70 - 99  mg/dL   BUN 15 8 - 23 mg/dL   Creatinine, Ser 0.98 0.44 - 1.00 mg/dL   Calcium 9.0 8.9 - 10.3 mg/dL   GFR calc non Af Amer 53 (L) >60 mL/min   GFR calc Af Amer >60 >60 mL/min    Comment: (NOTE) The eGFR has been calculated using the CKD EPI equation. This calculation has not been validated in all clinical situations. eGFR's persistently <60 mL/min signify possible Chronic Kidney Disease.    Anion gap 7 5 - 15    Comment: Performed at [facility], [address], [place], [place] [address]  [place] 125     Status: Abnormal   Collection Time: 08/07/18  2:45 PM  Result Value Ref Range   Cancer Antigen (CA) 125 100.0 (H) 0.0 - 38.1 U/mL    Comment: (NOTE) Roche [employer] Electrochemiluminescence Immunoassay (ECLIA) Values obtained with different assay methods or kits cannot be used interchangeably.  Results cannot be interpreted as absolute evidence of the presence or absence of malignant disease. Performed At: [facility] [place], [place] [id number] Rush Farmer MD BJ:[id number]     Assessment/Plan:  Tobacco use We had a discussion for approximately 3 minutes regarding the absolute need for smoking cessation due to the deleterious nature of tobacco on the vascular system. We discussed the tobacco use would diminish patency of any intervention, and likely significantly worsen progressio of disease. We discussed multiple agents for quitting including replacement therapy or medications to reduce cravings such as Chantix. The patient voices their understanding of the importance of smoking cessation.  Hypertension blood pressure control important in reducing the progression of atherosclerotic disease and aneurysmal degeneration. On appropriate oral medications.   Thoracic aortic aneurysm without rupture ([place]) I have independently reviewed the CT scan.  From a non-vascular standpoint, there are ovarian cystic structures that are worrisome for malignancy and apparently there were also some bladder issues.  From a vascular standpoint which is why she was referred here, the uppermost portion of the CT scan of the abdomen shows a 5 cm thoracic aortic aneurysm with significant amount of mural thrombus.  At the level of the visceral vessels, the aorta tapers down to a reasonably normal caliber of about 2.9 cm in diameter.  The upper extent of this thoracic aortic aneurysm was not well seen.  The abdominal aorta was not aneurysmal but it did have atherosclerotic changes.  She also had iliac and femoral atherosclerotic changes. We do not know the full extent of her thoracic aortic aneurysm, but the visualized portion would be a moderate aneurysm that would not need emergent repair.  I have ordered a CT scan of the chest and this will be performed and she will return to discuss the results and further treatment options.  She also has an ongoing evaluation for potential malignancy in her pelvis, and this can continue.  I  discussed the importance of blood pressure control and smoking cessation.  We discussed the natural history and pathophysiology of aortic aneurysmal disease.  I will see her back following her scan.      Leotis Pain 08/16/2018, 2:03 PM   This note was created with Dragon medical transcription system.  Any errors from dictation are unintentional.

## 2018-08-16 NOTE — Assessment & Plan Note (Signed)
blood pressure control important in reducing the progression of atherosclerotic disease and aneurysmal degeneration. On appropriate oral medications.  

## 2018-08-16 NOTE — Patient Instructions (Signed)
Thoracic Aortic Aneurysm An aneurysm is a bulge in an artery. It happens when blood pushes up against a weakened or damaged artery wall. A thoracic aortic aneurysm is an aneurysm that occurs in the first part of the aorta, between the heart and the diaphragm. The aorta is the main artery of the body. It supplies blood from the heart to the rest of the body. Some aneurysms may not cause symptoms or problems. However, the major concern with a thoracic aortic aneurysm is that it can enlarge and burst (rupture), or blood can flow between the layers of the wall of the aorta through a tear (aorticdissection). Both of these conditions can cause bleeding inside the body and can be life-threatening if they are not diagnosed and treated right away. What are the causes? The exact cause of this condition is not known. What increases the risk? The following factors may make you more likely to develop this condition:  Being age 65 or older.  Having a hardening of the arteries caused by the buildup of fat and other substances in the lining of a blood vessel (arteriosclerosis).  Having inflammation of the walls of an artery (arteritis).  Having a genetic disease that weakens the body's connective tissue, such as Marfan syndrome.  Having an injury or trauma to the aorta.  Having an infection that is caused by bacteria, such as syphilis or staphylococcus, in the wall of the aorta (infectious aortitis).  Having high blood pressure (hypertension).  Being female.  Being white (Caucasian).  Having high cholesterol.  Having a family history of aneurysms.  Using tobacco.  Having chronic obstructive pulmonary disease (COPD).  What are the signs or symptoms? Symptoms of this condition vary depending on the size and rate of growth of the aneurysm. Most grow slowly and do not cause any symptoms. When symptoms do occur, they may include:  Pain in the chest, back, sides, or abdomen. The pain may vary in  intensity. A sudden onset of severe pain may indicate that the aneurysm has ruptured.  Hoarseness.  Cough.  Shortness of breath.  Swallowing problems.  Swelling in the face, arms, or legs.  Fever.  Unexplained weight loss.  How is this diagnosed? This condition may be diagnosed with:  An ultrasound.  X-rays.  A CT scan.  An MRI.  Tests to check the arteries for damage or blockages (angiogram).  Most unruptured thoracic aortic aneurysms cause no symptoms, so they are often found during exams for other conditions. How is this treated? Treatment for this condition depends on:  The size of the aneurysm.  How fast the aneurysm is growing.  Your age.  Risk factors for rupture.  Aneurysms that are smaller than 2.2 inches (5.5 cm) may be managed by using medicines to control blood pressure, manage pain, or fight infection. You may need regular monitoring to see if the aneurysm is getting bigger. Your health care provider may recommend that you have an ultrasound every year or every 6 months. How often you need to have an ultrasound depends on the size of the aneurysm, how fast it is growing, and whether you have a family history of aneurysms. Surgical repair may be needed if your aneurysm is larger than 2.2 inches or if it is growing quickly. Follow these instructions at home: Eating and drinking  Eat a healthy diet. Your health care provider may recommend that you: ? Lower your salt (sodium) intake. In some people, too much salt can raise blood pressure and increase   the risk of thoracic aortic aneurysm. ? Avoid foods that are high in saturated fat and cholesterol, such as red meat and dairy. ? Eat a diet that is low in sugar. ? Increase your fiber intake by including whole grains, vegetables, and fruits in your diet. Eating these foods may help to lower blood pressure.  Limit or avoid alcohol as recommended by your health care provider. Lifestyle  Follow instructions  from your health care provider about healthy lifestyle habits. Your health care provider may recommend that you: ? Do not use any products that contain nicotine or tobacco, such as cigarettes and e-cigarettes. If you need help quitting, ask your health care provider. ? Keep your blood pressure within normal limits. The target limit for most people is below 120/80. Check your blood pressure regularly. If it is high, ask your health care provider about ways that you can control it. ? Keep your blood sugar (glucose) level and cholesterol levels within normal limits. Target limits for most people are:  Blood glucose level: Less than 100 mg/dL.  Total cholesterol level: Less than 200 mg/dL. ? Maintain a healthy weight. Activity  Stay physically active and exercise regularly. Talk with your health care provider about how often you should exercise and ask which types of exercise are safe for you.  Avoid heavy lifting and activities that take a lot of effort (are strenuous). Ask your health care provider what activities are safe for you. General instructions  Keep all follow-up visits as told by your health care provider. This is important. ? Talk with your health care provider about regular screenings to see if the aneurysm is getting bigger.  Take over-the-counter and prescription medicines only as told by your health care provider. Contact a health care provider if:  You have discomfort in your upper back, neck, or abdomen.  You have trouble swallowing.  You have a cough or hoarseness.  You have a family history of aneurysms.  You have unexplained weight loss. Get help right away if:  You have sudden, severe pain in your upper back and abdomen. This pain may move into your chest and arms.  You have shortness of breath.  You have a fever. This information is not intended to replace advice given to you by your health care provider. Make sure you discuss any questions you have with your  health care provider. Document Released: 10/09/2005 Document Revised: 07/21/2016 Document Reviewed: 07/21/2016 Elsevier Interactive Patient Education  2018 [employer]vier Inc.  

## 2018-08-16 NOTE — Assessment & Plan Note (Signed)

## 2018-08-19 ENCOUNTER — Telehealth: Payer: Self-pay

## 2018-08-19 NOTE — Telephone Encounter (Signed)
Called and spoke with Ms. Kouns. She can arrange for transportation on 11/8 or 11/15 in the am hour for her biopsy. Invasive checklist faxed to scheduling. Oncology Nurse Navigator Documentation  Navigator Location: [facility] (08/19/18 1600)   )Navigator Encounter Type: Telephone (08/19/18 1600) Telephone: Lahoma Crocker Call;Appt Confirmation/Clarification (08/19/18 1600)                                                  Time Spent with Patient: 15 (08/19/18 1600)

## 2018-08-20 ENCOUNTER — Telehealth: Payer: Self-pay

## 2018-08-20 DIAGNOSIS — N838 Other noninflammatory disorders of ovary, fallopian tube and broad ligament: Secondary | ICD-10-CM

## 2018-08-20 NOTE — Telephone Encounter (Signed)
error 

## 2018-08-20 NOTE — Telephone Encounter (Signed)
Spoke with Felicia Sellers. She will come to cancer center and have HE4 drawn prior to her PET scan. Message sent to scheduling for lab encounter 10/31. Oncology Nurse Navigator Documentation  Navigator Location: [facility] (08/20/18 1200)   )Navigator Encounter Type: Telephone (08/20/18 1200) Telephone: Outgoing Call;Patient Update (08/20/18 1200)                                                  Time Spent with Patient: 15 (08/20/18 1200)

## 2018-08-22 ENCOUNTER — Ambulatory Visit
Admission: RE | Admit: 2018-08-22 | Discharge: 2018-08-22 | Disposition: A | Discharge status: Home / Self Care | Payer: Medicare Other | Source: Ambulatory Visit | Attending: Nurse Practitioner | Admitting: Nurse Practitioner

## 2018-08-22 ENCOUNTER — Other Ambulatory Visit: Payer: Self-pay

## 2018-08-22 ENCOUNTER — Inpatient Hospital Stay: Payer: Medicare Other

## 2018-08-22 DIAGNOSIS — D3912 Neoplasm of uncertain behavior of left ovary: Secondary | ICD-10-CM | POA: Diagnosis not present

## 2018-08-22 DIAGNOSIS — N838 Other noninflammatory disorders of ovary, fallopian tube and broad ligament: Secondary | ICD-10-CM

## 2018-08-22 DIAGNOSIS — R590 Localized enlarged lymph nodes: Secondary | ICD-10-CM | POA: Diagnosis not present

## 2018-08-22 LAB — GLUCOSE, CAPILLARY: Glucose-Capillary: 96 mg/dL (ref 70–99)

## 2018-08-22 MED ORDER — FLUDEOXYGLUCOSE F - 18 (FDG) INJECTION
7.0000 | Freq: Once | INTRAVENOUS | Status: AC | PRN
  Administered 2018-08-22: 7.22 via INTRAVENOUS

## 2018-08-23 ENCOUNTER — Telehealth: Payer: Self-pay

## 2018-08-23 NOTE — Telephone Encounter (Signed)
Dr. [name] has reviewed PET results. She still recommends biopsy. Appears most important area to biopsy is the right ovarian lesion. I have sent updated request to radiology/scheduling. Oncology Nurse Navigator Documentation  Navigator Location: [facility] (08/23/18 0900)   )Navigator Encounter Type: Letter/Fax/Email (08/23/18 0900)                                                    Time Spent with Patient: 15 (08/23/18 0900)

## 2018-08-24 LAB — HUMAN EPIDIDYMIS PROT 4,SERIAL: HE4: 228 pmol/L — ABNORMAL HIGH (ref 0.0–96.9)

## 2018-08-27 ENCOUNTER — Telehealth: Payer: Self-pay

## 2018-08-27 NOTE — Telephone Encounter (Signed)
Dr. Kathlene Cote has  looked at the PET scan.  Unfortunately, the right adnexal mass is quite high risk for [place] to biopsy with internal iliac artery branches and part of the rectosigmoid colon behind it. No anterior window to it. The left adnexal mass would be much easier and safe for [place] to sample percutaneously, but is not metabolic at all by PET. I do not feel comfortable with [place] attempting a right adnexal biopsy percutaneously.   Discussed with Dr. [name] and inquired if a transvaginal biopsy could be performed.   Per Dr. Kathlene Cote: Both located fairly lateral in pelvis and well above uterus. Plus, our options with TV US are limited to needle aspiration. We don't have a core device that works with TV needle guide.  We will bring her back to clinic to discuss laparoscopic biopsy. I have called and reviewed PET results as well as plan of care. Appointment made for 11/20 at 1430. Read back performed.  Oncology Nurse Navigator Documentation  Navigator Location: [facility] (08/27/18 1000)   )Navigator Encounter Type: Telephone;Diagnostic Results (08/27/18 1000) Telephone: Diagnostic Results (08/27/18 1000)                                                  Time Spent with Patient: 30 (08/27/18 1000)

## 2018-09-03 ENCOUNTER — Ambulatory Visit: Payer: Medicare Other

## 2018-09-11 ENCOUNTER — Inpatient Hospital Stay: Payer: Medicare Other | Attending: Obstetrics and Gynecology | Admitting: Obstetrics and Gynecology

## 2018-09-11 VITALS — BP 121/67 | HR 59 | Temp 97.2°F | Resp 20 | Wt 135.2 lb

## 2018-09-11 DIAGNOSIS — R971 Elevated cancer antigen 125 [CA 125]: Secondary | ICD-10-CM

## 2018-09-11 DIAGNOSIS — D271 Benign neoplasm of left ovary: Secondary | ICD-10-CM | POA: Diagnosis not present

## 2018-09-11 DIAGNOSIS — D270 Benign neoplasm of right ovary: Secondary | ICD-10-CM

## 2018-09-11 DIAGNOSIS — F17210 Nicotine dependence, cigarettes, uncomplicated: Secondary | ICD-10-CM | POA: Insufficient documentation

## 2018-09-11 DIAGNOSIS — N838 Other noninflammatory disorders of ovary, fallopian tube and broad ligament: Secondary | ICD-10-CM

## 2018-09-11 NOTE — Progress Notes (Signed)
Gynecologic Oncology Interval Visit   Referring Provider: Zeb Comfort, MD [address] [place], [place] [address] [phone]  Chief Concern: Bilateral ovarian masses  Subjective:  Felicia Sellers is a 77 y.o. female, initially seen in consultation from Dr. Dema Severin for bilateral ovarian masses, who returns to clinic today for follow-up.   HE4 08/22/18 228  CA 125 08/07/18 100  ROMA 72%   Radiology was unable to biopsy the lesion either by abdominal or vaginal approach and recommended PET scan.   08/22/18- PET 1.  Complex cystic left adnexal lesion is photopenic with multi septal septations and varying density contents.  Correlation with multi modality imaging and tumor markers is recommended for management. 2.  Right ovarian lesion measures 4.4 x 2.8 cm, solid-appearing with low metabolic activity with maximum SUV of 2.6.  Considered to be abnormally large for ovary in this age group and low-grade activity could represent low-grade tumor.  Multimodality imaging such as ultrasound and tumor markers recommended. 3.  No evidence of metastatic spread in the abdomen. 4.  Several borderline enlarged mediastinal lymph nodes are present and have a low-grade accentuated metabolic activity mildly above blood pool.  Not characteristic for metastatic ovarian cancer but they merit surveillance with suggested follow-up of chest CT in 3 months. 5.  Faint nodularity in both upper lobes with largest nodule measuring 5 mm.  Not hypermetabolic but the low sensitive PET/threshold and recommend follow-up with CT. 6.  Other findings of potential clinical significance: Aortic arteriosclerosis, emphysema, coronary arteriosclerosis, descending thoracic aortic aneurysm, mild cardiomegaly, old granulomatous disease, sigmoid colon diverticulosis, lumbar spondylosis, degenerative disc disease.    Today she reports feeling at baseline.  She strained her back couple of days ago but feels it is improving. She was  last seen by cardiology, Dr. Saralyn Pilar, on 9/19, for history of nonischemic cardiomyopathy.  2D echo on 01/01/2018 revealed LVEF of 50% with moderate mitral and tricuspid regurgitation. She has intermittent peripheral edema with right worse than left.  She has chronic exertional dyspnea due to underlying COPD and ongoing tobacco abuse.  She continues to smoke approximately half a pack per day and says while she is trying to cut back she is not interested in quitting. CT angios chest scheduled for 09/17/2018 to complete evaluation of aortic aneurysm.  She was seen by Dr. Lucky Cowboy, vascular surgery on 08/16/2018.     Gynecologic History:  Felicia Sellers is a pleasant female, initially seen in consultation from Dr. Dema Severin for bilateral ovarian masses, who returns to clinic today for follow-up.   US Abdomen 07/08/2018 1. Tiny stone versus tumefactive sludge within the gallbladder. Negative for acute cholecystitis or biliary dilatation 2. 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 Recommend CTA to more thoroughly evaluate the aorta.  CT scan 07/24/2018 1. Bilateral heterogeneous partially cystic and septated ovarian masses. Left ovarian complex mass measures 10.6 x 6.1 cm. Right ovarian mass measures 4.5 x 2.8 cm. No ascites or evidence of peritoneal disease by CT.  2. Atherosclerotic disease involving celiac, SMA, renals, IMA, and iliac vessels.  3. Diverticulosis without acute inflammatory process 4.  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   She was symptomatic with weight gain and abdominal pressure.  She has chronic shortness of breath and leg pain with walking.  Chronic cough.  History of kyphosis with chronic back and joint pain.  She reports distant history of lung  cancer but has not seen pulmonology in "long time, cannot remember his name".  CT finding of thoracic aneurysm  prompted referral to Dr. Rodman Comp of alcohol abuse but quit drinking approximately 01/2018.   Problem List: Patient Active Problem List   Diagnosis Date Noted  . Thoracic aortic aneurysm without rupture ([place]) 08/16/2018  . Kyphosis 08/07/2018  . Hypertension 08/07/2018  . Tobacco use 08/07/2018  . Age-related osteoporosis without current pathological fracture 12/21/2016  . Chronic hyponatremia 11/04/2015  . Chronic low back pain 06/18/2015  . Cardiomyopathy ([place]) 08/13/2014  . Left bundle branch block (LBBB) 07/23/2014  . Pedal edema 07/23/2014  . SOB (shortness of breath) on exertion 07/23/2014    Past Medical History: Past Medical History:  Diagnosis Date  . Cancer ([place])    right lung removed - lung cancer  . History of stomach ulcers   . Hypertension     Past Surgical History: Past Surgical History:  Procedure Laterality Date  . LUNG REMOVAL, PARTIAL Left     Past Gynecologic History:  G1P4 Distant history of birth control pill use Denies history of STIs or abnormal pap smears  OB History:  OB History  No data available    Family History: Family History  Problem Relation Age of Onset  . Breast cancer Neg Hx     Social History: Social History   Socioeconomic History  . Marital status: Single    Spouse name: Not on file  . Number of children: Not on file  . Years of education: Not on file  . Highest education level: Not on file  Occupational History  . Not on file  Social Needs  . Financial resource strain: Not on file  . Food insecurity:    Worry: Not on file    Inability: Not on file  . Transportation needs:    Medical: Not on file    Non-medical: Not on file  Tobacco Use  . Smoking status: Current Every Day Smoker    Packs/day: 1.00    Years: 66.00    Pack years: 66.00  . Smokeless tobacco: Never Used  Substance and Sexual Activity  . Alcohol use: Not Currently    Comment: use to drink alot ( vodka) 6 months ago  . Drug use: Never  .  Sexual activity: Not Currently  Lifestyle  . Physical activity:    Days per week: Not on file    Minutes per session: Not on file  . Stress: Not on file  Relationships  . Social connections:    Talks on phone: Not on file    Gets together: Not on file    Attends religious service: Not on file    Active member of club or organization: Not on file    Attends meetings of clubs or organizations: Not on file    Relationship status: Not on file  . Intimate partner violence:    Fear of current or ex partner: Not on file    Emotionally abused: Not on file    Physically abused: Not on file    Forced sexual activity: Not on file  Other Topics Concern  . Not on file  Social History Narrative  . Not on file    Allergies: No Known Allergies  Current Medications: Current Outpatient Medications  Medication Sig Dispense Refill  . Albuterol Sulfate 108 (90 Base) MCG/ACT AEPB Inhale 2 puffs into the lungs every 6 (six) hours as needed.    Marland Kitchen alendronate (FOSAMAX) 70 MG tablet Take  70 mg by mouth once a week. Take with a full glass of water on an empty stomach.    Marland Kitchen amLODipine (NORVASC) 10 MG tablet Take 10 mg by mouth daily.    Marland Kitchen atorvastatin (LIPITOR) 40 MG tablet Take 40 mg by mouth daily.    . calcium carbonate (OS-CAL - DOSED IN MG OF ELEMENTAL CALCIUM) 1250 (500 Ca) MG tablet Take 1 tablet by mouth.    . Cholecalciferol 1000 units capsule Take 1,000 Units by mouth daily.    Marland Kitchen docusate sodium (COLACE) 100 MG capsule Take 100 mg by mouth daily.    Marland Kitchen gabapentin (NEURONTIN) 300 MG capsule Take 300 mg by mouth 3 (three) times daily.    Marland Kitchen lisinopril (PRINIVIL,ZESTRIL) 5 MG tablet Take 5 mg by mouth daily.    . metoprolol succinate (TOPROL-XL) 25 MG 24 hr tablet Take 25 mg by mouth daily.    . pantoprazole (PROTONIX) 40 MG tablet Take 40 mg by mouth daily.     No current facility-administered medications for this visit.     Review of Systems General:  no complaints Skin: no  complaints Eyes: no complaints HEENT: no complaints Breasts: no complaints Pulmonary: Chronic dyspnea with exertion: Smoker Cardiac: Per HPI Gastrointestinal: Improved per HPI Genitourinary/Sexual: no complaints Ob/Gyn: no complaints Musculoskeletal: Back pain per HPI Hematology: no complaints Neurologic/Psych: no complaints   Objective:  Physical Examination:  BP 121/67 (BP Location: Left Arm, Patient Position: Sitting)   Pulse (!) 59   Temp (!) 97.2 F (36.2 C) (Tympanic)   Resp 20   Wt 135 lb 3 oz (61.3 kg)   SpO2 97%   BMI 23.95 kg/m    ECOG Performance Status: 2 - Symptomatic, <50% confined to bed  GENERAL: chronically ill appearing, no acute distress. Unaccompanied.  HEENT:  Sclerae anicteric. Poor dentition LUNGS:  Bilateral rhonchi, worse lung bases HEART:  Regular rate and rhythm. No murmur appreciated. ABDOMEN:  Soft, nontender.   MSK:  No focal spinal tenderness to palpation. Kyphosis.  EXTREMITIES:  No peripheral edema.   SKIN:  Clear with no obvious rashes or skin changes. NEURO:  Nonfocal. Well oriented.  Appropriate affect.  Pelvic: exam deferred today  Lab Review No labs on site today  Radiologic Imaging: As per HPI    Assessment:  Felicia Sellers is a 77 y.o. female diagnosed with symptomatic bilateral adnexal masses concerning for malignancy (Partially cystic and septated ovarian masses. Left ovarian complex mass measures 10.6 x 6.1 cm. Right ovarian mass measures 4.5 x 2.8 cm). No ascites which is reassuring. Elevated CA125, HE4 and ROMA score. CA125 my be elevated due to CHF. She has significant co-morbid conditions and poor surgical candidate.   Medical co-morbidities complicating care: HTN, Tobacco use, Cardiomyopathy ([place]), s/p lung resection, diffuse atherosclerosis, and abdominal aortic aneurysm up to 4.6 cm.  Plan:   Problem List Items Addressed This Visit    None    Visit Diagnoses    Ovarian mass    -  Primary   Relevant Orders   US  PELVIC COMPLETE WITH TRANSVAGINAL   Human Epididymis Prot 4,Serial   CA 125      We discussed options for management options including observation versus surgery. We reviewed risks of surgery.  These include infection, anesthesia, bleeding, transfusion, wound separation, vaginal cuff dehiscence, medical issues (blood clots, stroke, heart attack, fluid in the lungs, pneumonia, abnormal heart rhythm, death), possible exploratory surgery with larger incision, allergic reaction, injury to adjacent organs (bowel, bladder, blood  vessels, nerves, ureters, uterus). She is at higher risk for medical complications given her comorbid conditions and she is aware that she is a high-risk surgical candidate. Given lung resection and smoking she is also at risk for inability to extubate.   After discussion she has opted for observation.   Suggested return to clinic in  January for repeat US, CA125, and HE4.   The patient's diagnosis, an outline of the further diagnostic and laboratory studies which will be required, the recommendation, and alternatives were discussed.  All questions were answered to the patient's satisfaction.  I personally had a face to face interaction and evaluated the patient jointly with the NP, Ms. Beckey Rutter.  I have reviewed her history and available records and have reviewed the key portions of the physical exam  documented above by the APP.  I have discussed the case with the APP and the patient.  I agree with the above documentation, assessment and plan which was fully formulated by me.  Counseling was completed by me.   IA total of 25 minutes were spent with the patient/family today; >50% was spent in education, counseling and coordination of care for symptomatic bilateral adnexal masses concerning for malignancy.    Gaetana Michaelis, MD      CC:  Zeb Comfort, MD [address] [place], [place] [address] [phone]

## 2018-09-11 NOTE — Progress Notes (Signed)
Pt in for follow up reports some lower back pain at times.  Denies any signs of vaginal bleeding or discharge.

## 2018-09-16 ENCOUNTER — Other Ambulatory Visit (INDEPENDENT_AMBULATORY_CARE_PROVIDER_SITE_OTHER): Payer: Self-pay | Admitting: Nurse Practitioner

## 2018-09-16 DIAGNOSIS — I712 Thoracic aortic aneurysm, without rupture, unspecified: Secondary | ICD-10-CM

## 2018-09-17 ENCOUNTER — Other Ambulatory Visit
Admission: RE | Admit: 2018-09-17 | Discharge: 2018-09-17 | Disposition: A | Discharge status: Home / Self Care | Payer: Medicare Other | Source: Ambulatory Visit | Attending: Vascular Surgery | Admitting: Vascular Surgery

## 2018-09-17 ENCOUNTER — Ambulatory Visit
Admission: RE | Admit: 2018-09-17 | Discharge: 2018-09-17 | Disposition: A | Discharge status: Home / Self Care | Payer: Medicare Other | Source: Ambulatory Visit | Attending: Vascular Surgery | Admitting: Vascular Surgery

## 2018-09-17 ENCOUNTER — Telehealth (INDEPENDENT_AMBULATORY_CARE_PROVIDER_SITE_OTHER): Payer: Self-pay

## 2018-09-17 DIAGNOSIS — I712 Thoracic aortic aneurysm, without rupture, unspecified: Secondary | ICD-10-CM

## 2018-09-17 LAB — CREATININE, SERUM
Creatinine, Ser: 0.86 mg/dL (ref 0.44–1.00)
GFR calc non Af Amer: >60 mL/min (ref >60)

## 2018-09-17 MED ORDER — IOPAMIDOL (ISOVUE-370) INJECTION 76%
100.0000 mL | Freq: Once | INTRAVENOUS | Status: AC | PRN
  Administered 2018-09-17: 75 mL via INTRAVENOUS

## 2018-09-17 NOTE — Telephone Encounter (Signed)
Levada Dy from [facility] CT radiology called and stated that the wrong test was entered for this patient. She states that the patient needs to have a CT angio chest with/ or without in order for the techs to search the right thing.  Please change the order in the system, so that it's "no PE", per Levada Dy.  [phone]

## 2018-09-17 NOTE — Addendum Note (Signed)
Addended by: Algernon Huxley on: 09/17/2018 11:17 AM   Modules accepted: Orders

## 2018-11-06 ENCOUNTER — Ambulatory Visit
Admission: RE | Admit: 2018-11-06 | Discharge: 2018-11-06 | Disposition: A | Discharge status: Home / Self Care | Payer: Medicare Other | Source: Ambulatory Visit | Attending: Nurse Practitioner | Admitting: Nurse Practitioner

## 2018-11-06 ENCOUNTER — Inpatient Hospital Stay: Payer: Medicare Other | Attending: Obstetrics and Gynecology

## 2018-11-06 DIAGNOSIS — R971 Elevated cancer antigen 125 [CA 125]: Secondary | ICD-10-CM | POA: Insufficient documentation

## 2018-11-06 DIAGNOSIS — R978 Other abnormal tumor markers: Secondary | ICD-10-CM | POA: Insufficient documentation

## 2018-11-06 DIAGNOSIS — N838 Other noninflammatory disorders of ovary, fallopian tube and broad ligament: Secondary | ICD-10-CM | POA: Insufficient documentation

## 2018-11-07 ENCOUNTER — Telehealth: Payer: Self-pay | Admitting: Nurse Practitioner

## 2018-11-07 LAB — CA 125: CANCER ANTIGEN (CA) 125: 78.5 U/mL — AB (ref 0.0–38.1)

## 2018-11-07 NOTE — Telephone Encounter (Signed)
Called to review results of bloodwork. No answer. Message left with patient's next appointment date and time.

## 2018-11-08 LAB — HUMAN EPIDIDYMIS PROT 4,SERIAL: HE4: 200 pmol/L — ABNORMAL HIGH (ref 0.0–96.9)

## 2018-11-20 ENCOUNTER — Inpatient Hospital Stay: Payer: Medicare Other

## 2018-11-20 NOTE — Progress Notes (Deleted)
Gynecologic Oncology Interval Visit   Referring Provider: Zeb Comfort, MD [address] [place], [place] [address] [phone]  Chief Concern: Bilateral ovarian masses  Subjective:  Felicia Sellers is a 78 y.o. female, initially seen in consultation from Dr. Pricilla Riffle for bilateral ovarian masses, who returns to clinic today for follow-up. At her last visit we discussed options for management options including observation versus surgery and she opted for observation.    **She continues to smoke approximately half a pack per day and says while she is trying to cut back she is not interested in quitting. CT angios chest scheduled for 09/17/2018 to complete evaluation of aortic aneurysm.  She was seen by Dr. Lucky Cowboy, vascular surgery on 08/16/2018.   11/06/2018  CA125 = 78.5 HE4=200  ROMA 65%  Pelvic Ultrasound:  Large LEFT adnexal mass 11.2 x 5.7 x 6.5 cm, containing 2 distinct regions, with diffuse homogeneous internal hypoechogenicity throughout a large region and contiguous hypoechoic minimally complicated cystic component. No internal blood flow is seen within this lesion on color Doppler imaging. Single small echogenic focus question calcification at wall.  RIGHT adnexal mass seen on the prior PET-CT and CT exams could not be demonstrated on either transabdominal or endovaginal imaging, likely obscured by bowel.  Gynecologic History:  Felicia Sellers is a pleasant female, initially seen in consultation from Dr. Dema Severin for bilateral ovarian masses, who returns to clinic today for follow-up.   US Abdomen 07/08/2018 1. Tiny stone versus tumefactive sludge within the gallbladder. Negative for acute cholecystitis or biliary dilatation 2. 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 Recommend CTA to more thoroughly evaluate the aorta.  CT scan 07/24/2018 1. Bilateral heterogeneous partially cystic and septated ovarian masses. Left ovarian complex  mass measures 10.6 x 6.1 cm. Right ovarian mass measures 4.5 x 2.8 cm. No ascites or evidence of peritoneal disease by CT.  2. Atherosclerotic disease involving celiac, SMA, renals, IMA, and iliac vessels.  3. Diverticulosis without acute inflammatory process 4.  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   She was symptomatic with weight gain and abdominal pressure.  She has chronic shortness of breath and leg pain with walking.  Chronic cough.  History of kyphosis with chronic back and joint pain.  She reports distant history of lung cancer but has not seen pulmonology in "long time, cannot remember his name".  CT finding of thoracic aneurysm prompted referral to Dr. Rodman Comp of alcohol abuse but quit drinking approximately 01/2018.   08/22/18- PET 1.  Complex cystic left adnexal lesion is photopenic with multi septal septations and varying density contents.  Correlation with multi modality imaging and tumor markers is recommended for management. 2.  Right ovarian lesion measures 4.4 x 2.8 cm, solid-appearing with low metabolic activity with maximum SUV of 2.6.  Considered to be abnormally large for ovary in this age group and low-grade activity could represent low-grade tumor.  Multimodality imaging such as ultrasound and tumor markers recommended. 3.  No evidence of metastatic spread in the abdomen. 4.  Several borderline enlarged mediastinal lymph nodes are present and have a low-grade accentuated metabolic activity mildly above blood pool.  Not characteristic for metastatic ovarian cancer but they merit surveillance with suggested follow-up of chest CT in 3 months. 5.  Faint nodularity in both upper lobes with largest nodule measuring 5 mm.  Not hypermetabolic but the low sensitive PET/threshold and recommend  follow-up with CT. 6.  Other findings of potential clinical significance: Aortic arteriosclerosis,  emphysema, coronary arteriosclerosis, descending thoracic aortic aneurysm, mild cardiomegaly, old granulomatous disease, sigmoid colon diverticulosis, lumbar spondylosis, degenerative disc disease.  Tumor markers:  CA 125 on 08/07/18 100 HE4 on 08/22/18 228 ROMA 72%   Radiology was unable to biopsy the lesion either by abdominal or vaginal approach and recommended PET scan.   After discussion of surgery vs conservative observation she opted for surveillance.   Other medical issues:  She was seen by cardiology, Dr. Saralyn Pilar, on 9/19, for history of nonischemic cardiomyopathy.  2D echo on 01/01/2018 revealed LVEF of 50% with moderate mitral and tricuspid regurgitation. She has intermittent peripheral edema with right worse than left.  She has chronic exertional dyspnea due to underlying COPD and ongoing tobacco abuse.    Problem List: Patient Active Problem List   Diagnosis Date Noted  . Thoracic aortic aneurysm without rupture ([place]) 08/16/2018  . Kyphosis 08/07/2018  . Hypertension 08/07/2018  . Tobacco use 08/07/2018  . Age-related osteoporosis without current pathological fracture 12/21/2016  . Chronic hyponatremia 11/04/2015  . Chronic low back pain 06/18/2015  . Cardiomyopathy ([place]) 08/13/2014  . Left bundle branch block (LBBB) 07/23/2014  . Pedal edema 07/23/2014  . SOB (shortness of breath) on exertion 07/23/2014    Past Medical History: Past Medical History:  Diagnosis Date  . Cancer ([place])    right lung removed - lung cancer 1/3 lung removed   . History of stomach ulcers   . Hypertension     Past Surgical History: Past Surgical History:  Procedure Laterality Date  . LUNG REMOVAL, PARTIAL Left     Past Gynecologic History:  G1P4 Distant history of birth control pill use Denies history of STIs or abnormal pap smears  OB History:  OB History  No obstetric history on file.    Family History: Family History  Problem Relation Age of Onset  . Breast cancer  Neg Hx     Social History: Social History   Socioeconomic History  . Marital status: Single    Spouse name: Not on file  . Number of children: Not on file  . Years of education: Not on file  . Highest education level: Not on file  Occupational History  . Not on file  Social Needs  . Financial resource strain: Not on file  . Food insecurity:    Worry: Not on file    Inability: Not on file  . Transportation needs:    Medical: Not on file    Non-medical: Not on file  Tobacco Use  . Smoking status: Current Every Day Smoker    Packs/day: 1.00    Years: 66.00    Pack years: 66.00  . Smokeless tobacco: Never Used  Substance and Sexual Activity  . Alcohol use: Not Currently    Comment: use to drink alot ( vodka) 6 months ago  . Drug use: Never  . Sexual activity: Not Currently  Lifestyle  . Physical activity:    Days per week: Not on file    Minutes per session: Not on file  . Stress: Not on file  Relationships  . Social connections:    Talks on phone: Not on file    Gets together: Not on file    Attends religious service: Not on file    Active member of club or organization: Not on file    Attends meetings of clubs or organizations: Not on file  Relationship status: Not on file  . Intimate partner violence:    Fear of current or ex partner: Not on file    Emotionally abused: Not on file    Physically abused: Not on file    Forced sexual activity: Not on file  Other Topics Concern  . Not on file  Social History Narrative  . Not on file    Allergies: No Known Allergies  Current Medications: Current Outpatient Medications  Medication Sig Dispense Refill  . Albuterol Sulfate 108 (90 Base) MCG/ACT AEPB Inhale 2 puffs into the lungs every 6 (six) hours as needed.    Marland Kitchen alendronate (FOSAMAX) 70 MG tablet Take 70 mg by mouth once a week. Take with a full glass of water on an empty stomach.    Marland Kitchen amLODipine (NORVASC) 10 MG tablet Take 10 mg by mouth daily.    Marland Kitchen  atorvastatin (LIPITOR) 40 MG tablet Take 40 mg by mouth daily.    . calcium carbonate (OS-CAL - DOSED IN MG OF ELEMENTAL CALCIUM) 1250 (500 Ca) MG tablet Take 1 tablet by mouth.    . Cholecalciferol 1000 units capsule Take 1,000 Units by mouth daily.    Marland Kitchen docusate sodium (COLACE) 100 MG capsule Take 100 mg by mouth daily.    Marland Kitchen gabapentin (NEURONTIN) 300 MG capsule Take 300 mg by mouth 3 (three) times daily.    Marland Kitchen lisinopril (PRINIVIL,ZESTRIL) 5 MG tablet Take 5 mg by mouth daily.    . metoprolol succinate (TOPROL-XL) 25 MG 24 hr tablet Take 25 mg by mouth daily.    . pantoprazole (PROTONIX) 40 MG tablet Take 40 mg by mouth daily.     No current facility-administered medications for this visit.     Review of Systems General:  no complaints Skin: no complaints Eyes: no complaints HEENT: no complaints Breasts: no complaints Pulmonary: Chronic dyspnea with exertion: Smoker Cardiac: Per HPI Gastrointestinal: Improved per HPI Genitourinary/Sexual: no complaints Ob/Gyn: no complaints Musculoskeletal: Back pain per HPI Hematology: no complaints Neurologic/Psych: no complaints   Objective:  Physical Examination:  There were no vitals taken for this visit.   ECOG Performance Status: 2 - Symptomatic, <50% confined to bed  GENERAL: chronically ill appearing, no acute distress. Unaccompanied.  HEENT:  Sclerae anicteric. Poor dentition LUNGS:  Bilateral rhonchi, worse lung bases HEART:  Regular rate and rhythm. No murmur appreciated. ABDOMEN:  Soft, nontender.   MSK:  No focal spinal tenderness to palpation. Kyphosis.  EXTREMITIES:  No peripheral edema.   SKIN:  Clear with no obvious rashes or skin changes. NEURO:  Nonfocal. Well oriented.  Appropriate affect.  Pelvic: exam deferred today  Lab Review As per HPI  Radiologic Imaging: As per HPI     Assessment:  Felicia Sellers is a 78 y.o. female diagnosed with symptomatic bilateral adnexal masses concerning for malignancy (Partially  cystic and septated ovarian masses. Left ovarian complex mass measures 10.6 x 6.1 cm. Right ovarian mass measures 4.5 x 2.8 cm). No ascites which is reassuring. Elevated CA125, HE4 and ROMA score. CA125 my be elevated due to CHF. She has significant co-morbid conditions and poor surgical candidate.   Medical co-morbidities complicating care: HTN, Tobacco use, Cardiomyopathy ([place]), s/p lung resection, diffuse atherosclerosis, and abdominal aortic aneurysm up to 4.6 cm.  Plan:   Problem List Items Addressed This Visit    None      **We discussed options for management options including observation versus surgery. We reviewed risks of surgery.     After  discussion she has opted for observation.   Suggested return to clinic in  January for repeat US, CA125, and HE4.   The patient's diagnosis, an outline of the further diagnostic and laboratory studies which will be required, the recommendation, and alternatives were discussed.  All questions were answered to the patient's satisfaction.  I personally had a face to face interaction and evaluated the patient jointly with the NP, Ms. Beckey Rutter.  I have reviewed her history and available records and have reviewed the key portions of the physical exam  documented above by the APP.  I have discussed the case with the APP and the patient.  I agree with the above documentation, assessment and plan which was fully formulated by me.  Counseling was completed by me.   IA total of 25 minutes were spent with the patient/family today; >50% was spent in education, counseling and coordination of care for symptomatic bilateral adnexal masses concerning for malignancy.   [place] Gaetana Michaelis, MD      CC:  Zeb Comfort, MD [address] [place], [place] [address] [phone]

## 2018-11-21 ENCOUNTER — Telehealth: Payer: Self-pay

## 2018-11-21 NOTE — Telephone Encounter (Signed)
Felicia Sellers returned call. Dr. [name] has reviewed U/S and tumor markers. Felicia Sellers was unable to make her appointment 11/20/18 due to transportation issue. We reviewed U/S and tumor markers. She will follow up with Dr. [name] 12/18/18 at 1300. Read back performed.   IMPRESSION: Unremarkable uterus.  No normal appearing ovaries are visualized.  Previously seen RIGHT adnexal mass is not localized, likely obscured by bowel gas.  Large LEFT adnexal nonspecific complex cystic lesion 11.2 x 5.7 x 6.5 cm, a large portion of which demonstrates homogeneous hypoechogenicity throughout suggesting an endometrioma though there is an adjacent component of hypoechoic fluid; no internal blood flow within this LEFT adnexal mass by color Doppler imaging.  Characterization of these adnexal lesions by MR imaging with and without contrast recommended.  Results for [name], [name] (MRN [id number]) as of 11/21/2018 12:00  Ref. Range 11/06/2018 13:27  Cancer Antigen (CA) 125 Latest Ref Range: 0.0 - 38.1 U/mL 78.5 (H)  HE4 Latest Ref Range: 0.0 - 96.9 pmol/L 200.0 (H)

## 2018-11-21 NOTE — Telephone Encounter (Signed)
Voicemail left with Ms. Saban to review U/S/labs and to reschedule her appointment from 1/29 to see Dr. [name].

## 2018-12-18 ENCOUNTER — Inpatient Hospital Stay: Payer: Medicare Other | Attending: Obstetrics and Gynecology | Admitting: Obstetrics and Gynecology

## 2018-12-18 ENCOUNTER — Other Ambulatory Visit: Payer: Self-pay

## 2018-12-18 VITALS — BP 136/68 | HR 65 | Temp 95.6°F | Resp 18 | Wt 134.6 lb

## 2018-12-18 DIAGNOSIS — I509 Heart failure, unspecified: Secondary | ICD-10-CM | POA: Diagnosis not present

## 2018-12-18 DIAGNOSIS — R971 Elevated cancer antigen 125 [CA 125]: Secondary | ICD-10-CM | POA: Diagnosis not present

## 2018-12-18 DIAGNOSIS — I110 Hypertensive heart disease with heart failure: Secondary | ICD-10-CM | POA: Diagnosis not present

## 2018-12-18 DIAGNOSIS — N838 Other noninflammatory disorders of ovary, fallopian tube and broad ligament: Secondary | ICD-10-CM

## 2018-12-18 DIAGNOSIS — D3911 Neoplasm of uncertain behavior of right ovary: Secondary | ICD-10-CM | POA: Diagnosis present

## 2018-12-18 DIAGNOSIS — D3912 Neoplasm of uncertain behavior of left ovary: Secondary | ICD-10-CM | POA: Diagnosis not present

## 2018-12-18 DIAGNOSIS — F17210 Nicotine dependence, cigarettes, uncomplicated: Secondary | ICD-10-CM | POA: Insufficient documentation

## 2018-12-18 NOTE — Progress Notes (Signed)
Gynecologic Oncology Interval Visit   Referring Provider: Zeb Comfort, MD [address] [place], [place] [address] [phone]  Chief Concern: Bilateral ovarian masses  Subjective:  Felicia Sellers is a 78 y.o. female, initially seen in consultation from Dr. Pricilla Riffle for bilateral ovarian masses, who returns to clinic today for follow-up. At her last visit we discussed options for management options including observation versus surgery and she opted for observation.  She continues to smoke. She is asymptomatic from the mass and she does not have any concerning GI or constitutional symptoms.  CT angios chest scheduled for 09/17/2018 to complete evaluation of aortic aneurysm.  She was seen by Dr. Lucky Cowboy, vascular surgery on 08/16/2018. He felt that the visualized portion would be a moderate aneurysm that would not need emergent repair. 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 Radiology recommended semi-annual imaging followup by CTA or MRA.   11/06/2018  CA125 = 78.5 (decreased compared to 100 on 08/07/2018) HE4=200 (decreased compared to 100 on 08/22/2018)  ROMA 65%  Pelvic Ultrasound 11/06/2018. Large LEFT adnexal mass 11.2 x 5.7 x 6.5 cm, containing 2 distinct regions, with diffuse homogeneous internal hypoechogenicity throughout a large region and contiguous hypoechoic minimally complicated cystic component. No internal blood flow is seen within this lesion on color Doppler imaging. Single small echogenic focus question calcification at wall.  RIGHT adnexal mass seen on the prior PET-CT and CT exams could not be demonstrated on either transabdominal or endovaginal imaging, likely obscured by bowel.  IMPRESSION: Large LEFT adnexal nonspecific complex cystic lesion 11.2 x 5.7 x 6.5 cm, a large portion of which demonstrates homogeneous hypoechogenicity throughout suggesting an endometrioma though there is an adjacent  component of hypoechoic fluid; no internal blood flow within this LEFT adnexal mass by color Doppler imaging.    Gynecologic History:  Felicia Sellers is a pleasant female, initially seen in consultation from Dr. Dema Severin for bilateral ovarian masses, who returns to clinic today for follow-up.   US Abdomen 07/08/2018 1. Tiny stone versus tumefactive sludge within the gallbladder. Negative for acute cholecystitis or biliary dilatation 2. 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 Recommend CTA to more thoroughly evaluate the aorta.  CT scan 07/24/2018 1. Bilateral heterogeneous partially cystic and septated ovarian masses. Left ovarian complex mass measures 10.6 x 6.1 cm. Right ovarian mass measures 4.5 x 2.8 cm. No ascites or evidence of peritoneal disease by CT.  2. Atherosclerotic disease involving celiac, SMA, renals, IMA, and iliac vessels.  3. Diverticulosis without acute inflammatory process 4.  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   She was symptomatic with weight gain and abdominal pressure.  She has chronic shortness of breath and leg pain with walking.  Chronic cough.  History of kyphosis with chronic back and joint pain.  She reports distant history of lung cancer but has not seen pulmonology in "long time, cannot remember his name".  CT finding of thoracic aneurysm prompted referral to Dr. Rodman Comp of alcohol abuse but quit drinking approximately 01/2018.   08/22/18- PET 1.  Complex cystic left adnexal lesion is photopenic with multi septal septations and varying density contents.  Correlation with multi modality imaging and tumor markers is recommended for management. 2.  Right ovarian lesion measures 4.4 x 2.8 cm, solid-appearing with low metabolic activity with maximum SUV of 2.6.  Considered to be  abnormally large for ovary in this age group and  low-grade activity could represent low-grade tumor.  Multimodality imaging such as ultrasound and tumor markers recommended. 3.  No evidence of metastatic spread in the abdomen. 4.  Several borderline enlarged mediastinal lymph nodes are present and have a low-grade accentuated metabolic activity mildly above blood pool.  Not characteristic for metastatic ovarian cancer but they merit surveillance with suggested follow-up of chest CT in 3 months. 5.  Faint nodularity in both upper lobes with largest nodule measuring 5 mm.  Not hypermetabolic but the low sensitive PET/threshold and recommend follow-up with CT. 6.  Other findings of potential clinical significance: Aortic arteriosclerosis, emphysema, coronary arteriosclerosis, descending thoracic aortic aneurysm, mild cardiomegaly, old granulomatous disease, sigmoid colon diverticulosis, lumbar spondylosis, degenerative disc disease.  Tumor markers:  CA 125 on 08/07/18 100 HE4 on 08/22/18 228 ROMA 72%   Radiology was unable to biopsy the lesion either by abdominal or vaginal approach and recommended PET scan.   After discussion of surgery vs conservative observation she opted for surveillance.   Other medical issues:  She was seen by cardiology, Dr. Saralyn Pilar, on 9/19, for history of nonischemic cardiomyopathy.  2D echo on 01/01/2018 revealed LVEF of 50% with moderate mitral and tricuspid regurgitation. She has intermittent peripheral edema with right worse than left.  She has chronic exertional dyspnea due to underlying COPD and ongoing tobacco abuse.    Problem List: Patient Active Problem List   Diagnosis Date Noted  . Thoracic aortic aneurysm without rupture ([place]) 08/16/2018  . Kyphosis 08/07/2018  . Hypertension 08/07/2018  . Tobacco use 08/07/2018  . Age-related osteoporosis without current pathological fracture 12/21/2016  . Chronic hyponatremia 11/04/2015  . Chronic low back pain 06/18/2015  . Cardiomyopathy ([place]) 08/13/2014  .  Left bundle branch block (LBBB) 07/23/2014  . Pedal edema 07/23/2014  . SOB (shortness of breath) on exertion 07/23/2014    Past Medical History: Past Medical History:  Diagnosis Date  . 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 History of stomach ulcers   . Hypertension     Past Surgical History: Past Surgical History:  Procedure Laterality Date  . LUNG REMOVAL, PARTIAL Left     Past Gynecologic History:  G1P4 Distant history of birth control pill use Denies history of STIs or abnormal pap smears  OB History:  OB History  No obstetric history on file.    Family History: Family History  Problem Relation Age of Onset  . Breast cancer Neg Hx     Social History: Social History   Socioeconomic History  . Marital status: Single    Spouse name: Not on file  . Number of children: Not on file  . Years of education: Not on file  . Highest education level: Not on file  Occupational History  . Not on file  Social Needs  . Financial resource strain: Not on file  . Food insecurity:    Worry: Not on file    Inability: Not on file  . Transportation needs:    Medical: Not on file    Non-medical: Not on file  Tobacco Use  . Smoking status: Current Every Day Smoker    Packs/day: 1.00    Years: 66.00    Pack years: 66.00  . Smokeless tobacco: Never Used  Substance and Sexual Activity  . Alcohol use: Not Currently    Comment: use to drink alot ( vodka) 6 months ago  .  Drug use: Never  . Sexual activity: Not Currently  Lifestyle  . Physical activity:    Days per week: Not on file    Minutes per session: Not on file  . Stress: Not on file  Relationships  . Social connections:    Talks on phone: Not on file    Gets together: Not on file    Attends religious service: Not on file    Active member of club or organization: Not on file    Attends meetings of clubs or organizations: Not on file    Relationship status: Not on file  .  Intimate partner violence:    Fear of current or ex partner: Not on file    Emotionally abused: Not on file    Physically abused: Not on file    Forced sexual activity: Not on file  Other Topics Concern  . Not on file  Social History Narrative  . Not on file    Allergies: No Known Allergies  Current Medications: Current Outpatient Medications  Medication Sig Dispense Refill  . alendronate (FOSAMAX) 70 MG tablet Take 70 mg by mouth once a week. Take with a full glass of water on an empty stomach.    Marland Kitchen amLODipine (NORVASC) 10 MG tablet Take 10 mg by mouth daily.    Marland Kitchen atorvastatin (LIPITOR) 40 MG tablet Take 40 mg by mouth daily.    . calcium carbonate (OS-CAL - DOSED IN MG OF ELEMENTAL CALCIUM) 1250 (500 Ca) MG tablet Take 1 tablet by mouth.    . Cholecalciferol 1000 units capsule Take 1,000 Units by mouth daily.    Marland Kitchen docusate sodium (COLACE) 100 MG capsule Take 100 mg by mouth daily.    Marland Kitchen gabapentin (NEURONTIN) 300 MG capsule Take 300 mg by mouth 3 (three) times daily.    Marland Kitchen lisinopril (PRINIVIL,ZESTRIL) 5 MG tablet Take 5 mg by mouth daily.    . meloxicam (MOBIC) 7.5 MG tablet meloxicam 7.5 mg tablet  TK 1 T PO  D PRN    . metoprolol succinate (TOPROL-XL) 25 MG 24 hr tablet Take 25 mg by mouth daily.    . pantoprazole (PROTONIX) 40 MG tablet Take 40 mg by mouth daily.    . Albuterol Sulfate 108 (90 Base) MCG/ACT AEPB Inhale 2 puffs into the lungs every 6 (six) hours as needed.     No current facility-administered medications for this visit.     Review of Systems General:  no complaints Skin: no complaints Eyes: no complaints HEENT: no complaints Breasts: no complaints Pulmonary: Chronic dyspnea with exertion: Smoker. No complaints today Cardiac: Per HPI Gastrointestinal: negative or bloating, nausea, diarrhea or constipation.  Genitourinary/Sexual: no complaints Ob/Gyn: no complaints Musculoskeletal: Back pain per HPI Hematology: no complaints Neurologic/Psych: no  complaints   Objective:  Physical Examination:  BP 136/68 (BP Location: Left Arm, Patient Position: Sitting)   Pulse 65   Temp (!) 95.6 F (35.3 C) (Tympanic)   Resp 18   Wt 134 lb 9.6 oz (61.1 kg)   BMI 23.84 kg/m    ECOG Performance Status: 2 - Symptomatic, <50% confined to bed  GENERAL: chronically ill appearing, no acute distress. Unaccompanied.  HEENT:  Sclerae anicteric.  ABDOMEN:  Soft, nontender.  No ascites or hepatomegaly. No masses.  MSK:  No focal spinal tenderness to palpation. Kyphosis.  SKIN:  Clear with no obvious rashes or skin changes. NEURO:  Nonfocal. Well oriented.  Appropriate affect.   Pelvic: exam chaperoned by nurse;  Vulva: normal  appearing vulva with no masses, tenderness or lesions; Vagina: normal vagina; Cervix; No gross lesions. Uterus: WNLSS. No enlarged. BME: bilateral adnexa mases. On the right adnexal fullness and firm area adjacent to the uterus. On the left an at least 10 mass present left side, size firm and 5-6 cm along the base of the mass, unable to palpate the entire lesion. The left findings are unchanged from initial consult visit. RV: confirmatory.    Lab Review As per HPI  Radiologic Imaging: As per HPI     Assessment:  Felicia Sellers is a 78 y.o. female diagnosed with symptomatic bilateral adnexal masses concerning for malignancy (Partially cystic and septated ovarian masses. Left ovarian complex mass measures 10.6 x 6.1 cm. Right ovarian mass measures 4.5 x 2.8 cm). No ascites which is reassuring. Elevated CA125, HE4 and ROMA score. CA125 my be elevated due to CHF. Repeat tumor markers decreased and on imaging cystic masses are overall stable on the left and unable to visualize on the right. She remains asymptomatic.   She has significant co-morbid conditions and poor surgical candidate.   Medical co-morbidities complicating care: HTN, Tobacco use, Cardiomyopathy ([place]), s/p lung resection, diffuse atherosclerosis, and abdominal aortic  aneurysm up to 4.6 cm.  Plan:   Problem List Items Addressed This Visit    None    Visit Diagnoses    Ovarian mass    -  Primary   Relevant Orders   CA 125   Human Epididymis Prot 4,Serial      We discussed options for management options. If she did not have cardiac issues and lung resection as well as diffuse atherosclerosis we would typically offer surgery. But given her medical issues, decreased tumor markers, overall stability of radiologic findings, and not significant symptomatology I have recommended continuing with observation. She agrees with this plan.   Suggested return to clinic in 6 months for repeat US, CA125, and HE4.   A total of 20 minutes were spent with the patient; >50% was spent in education, counseling and coordination of care for symptomatic bilateral adnexal masses concerning for malignancy.   Shontia Gillooly Gaetana Michaelis, MD      CC:  Zeb Comfort, MD [address] [place], [place] [address] [phone]

## 2018-12-18 NOTE — Patient Instructions (Signed)
11/06/2018  CA125 = 78.5 (decreased compared to 100 on 08/07/2018) HE4=200 (decreased compared to 100 on 08/22/2018)  ROMA 65%

## 2018-12-18 NOTE — Progress Notes (Signed)
Here for follow up. " im doing alright " per pt.

## 2019-01-15 IMAGING — US US BREAST*L* LIMITED INC AXILLA
1 series · 13 of 20 positions shown · non-contrast
Comparison: Previous exam(s).

CLINICAL DATA: Delayed follow-up for bilateral probably benign
masses.

EXAM:
2D DIGITAL DIAGNOSTIC BILATERAL MAMMOGRAM WITH CAD AND ADJUNCT TOMO
BILATERAL BREAST ULTRASOUND

[Series 1: us breast*left* limited inc axilla · 0.03mm/px · 13 of 20 slices shown]
[im 1/20]
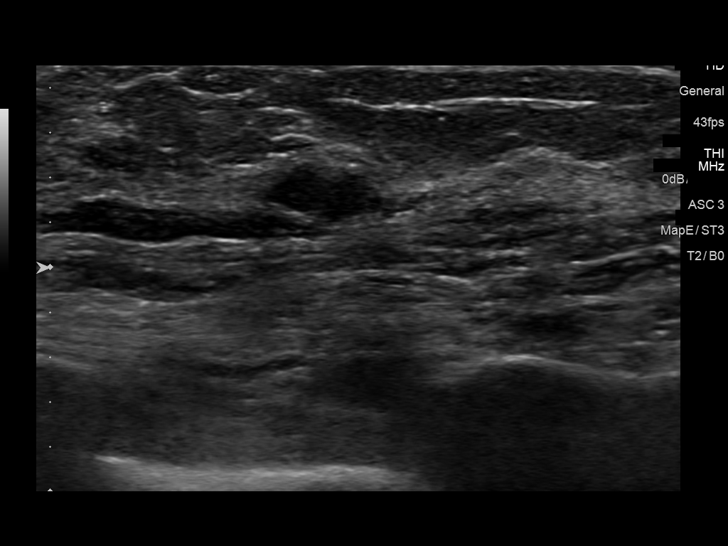
[im 3/20]
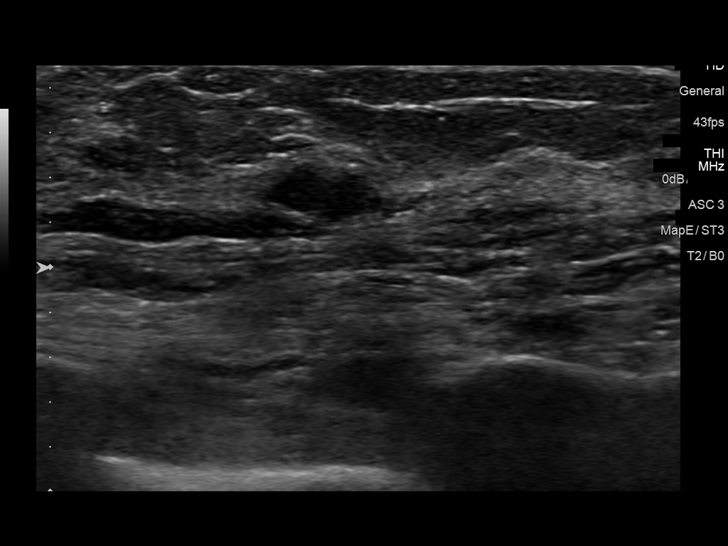
[im 4/20]
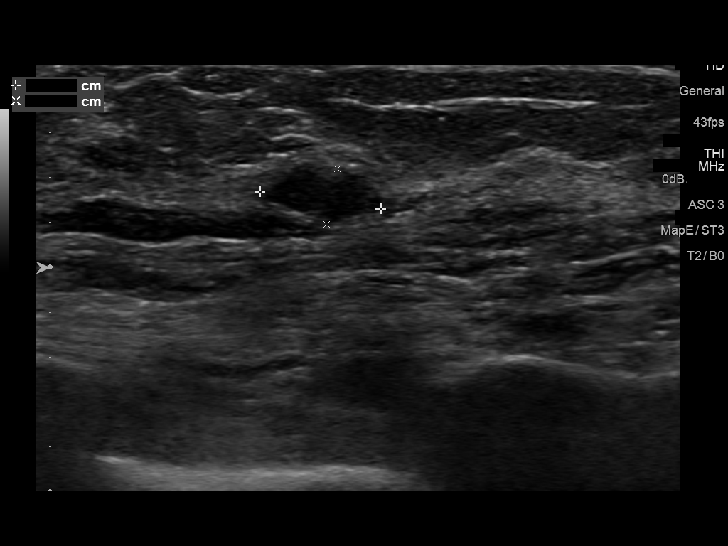
[im 6/20]
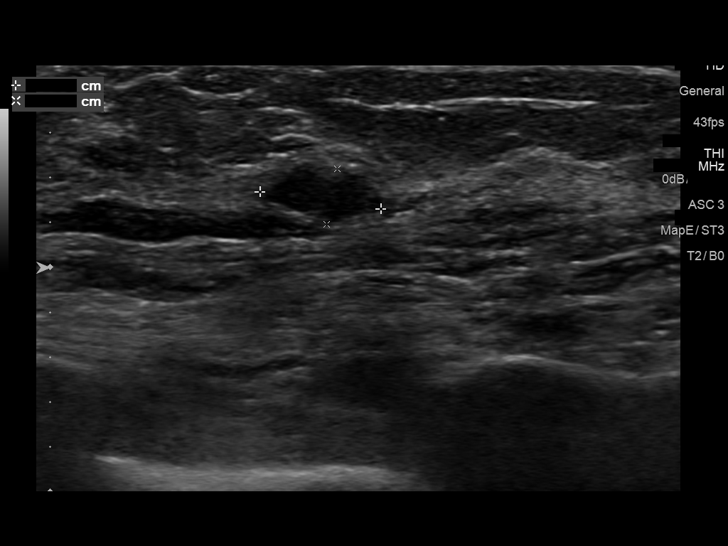
[im 7/20]
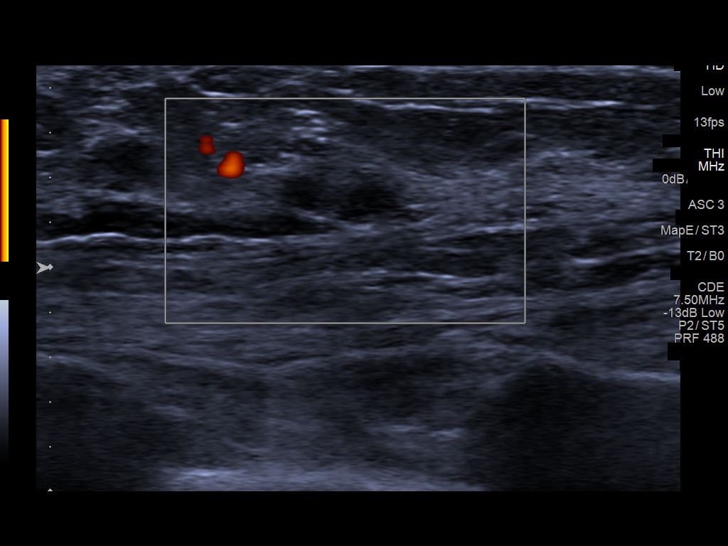
[im 9/20]
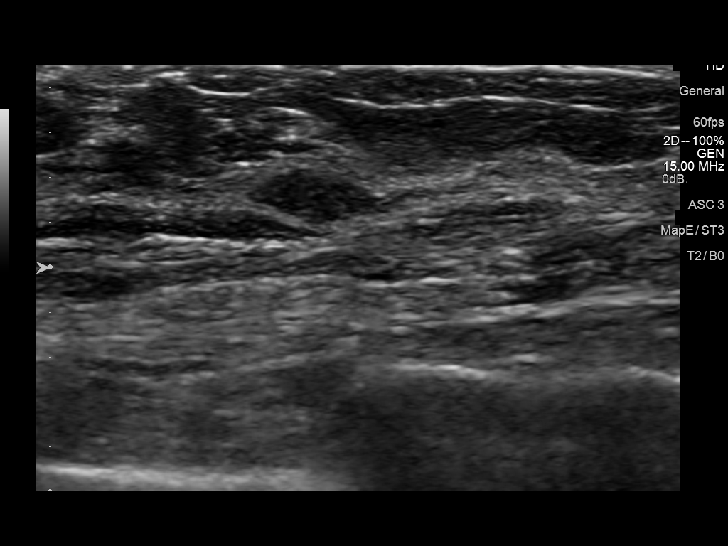
[im 11/20]
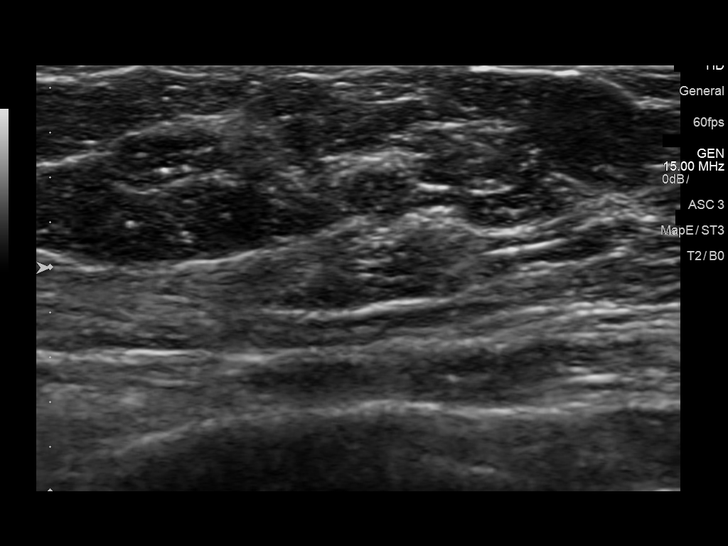
[im 12/20]
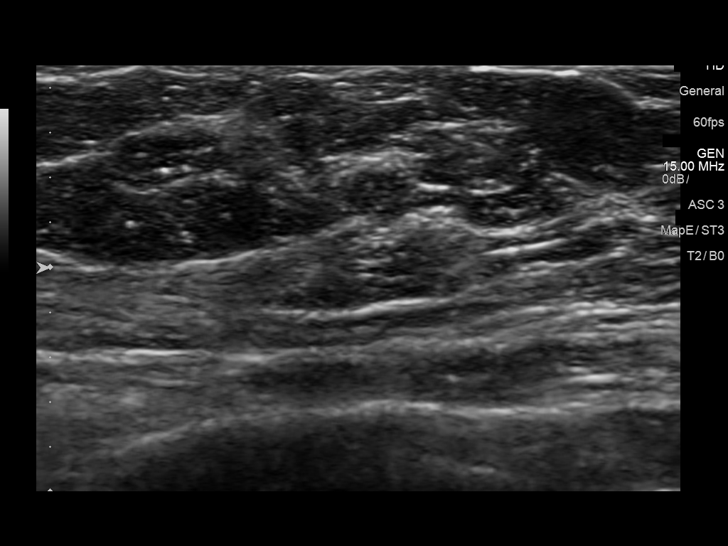
[im 14/20]
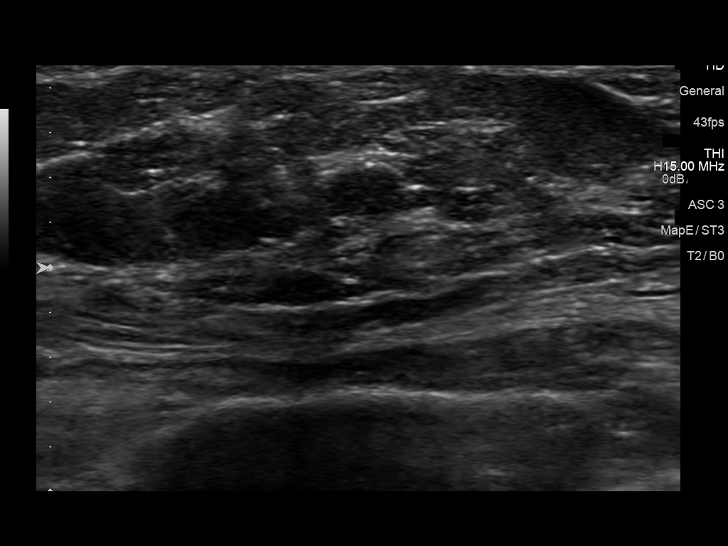
[im 15/20]
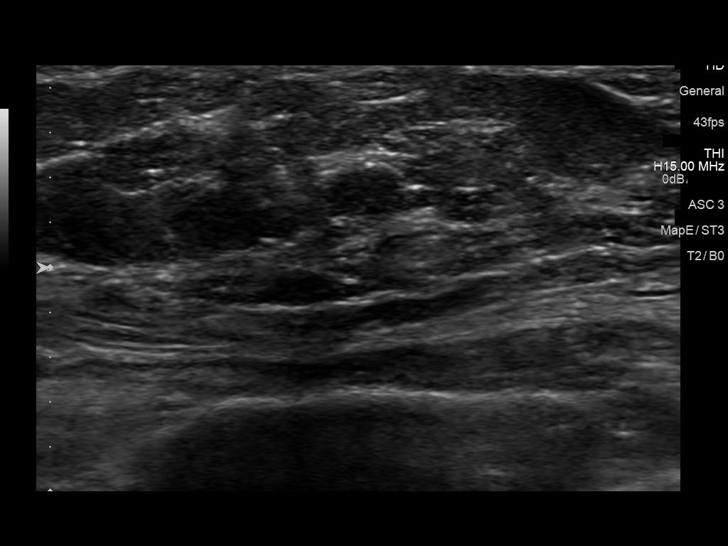
[im 17/20]
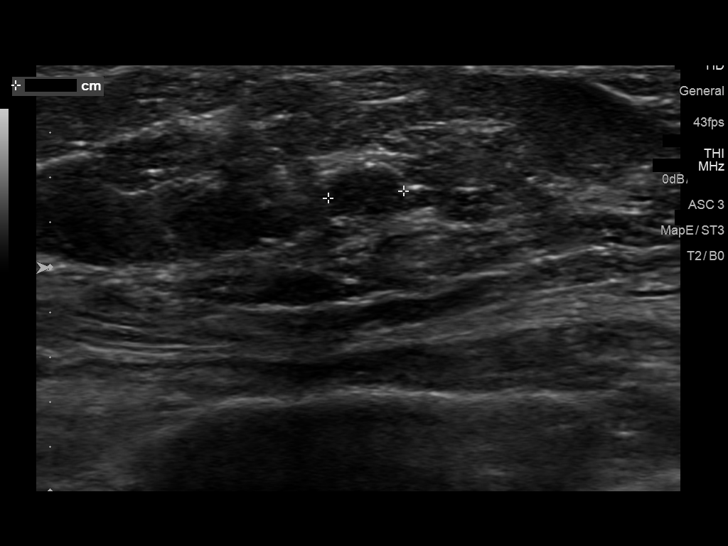
[im 18/20]
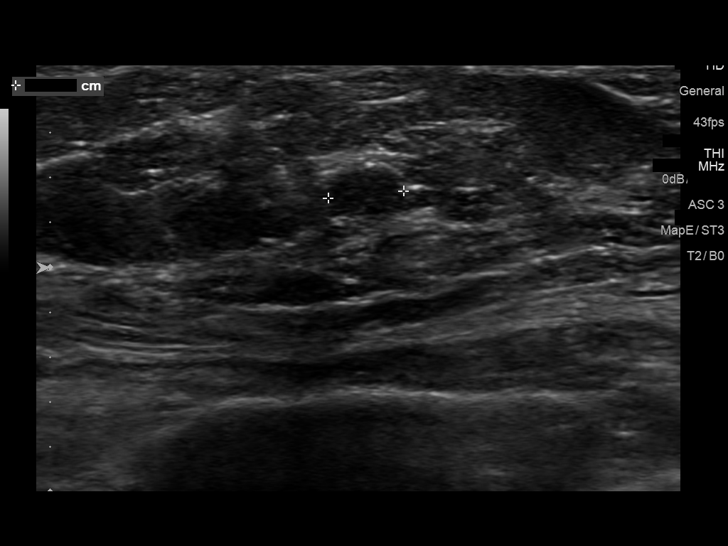
[im 20/20]
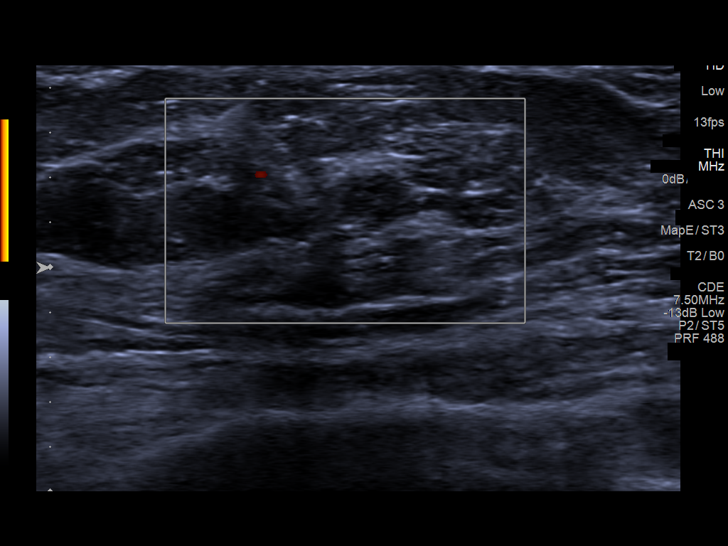

[13 of 20 positions shown; findings below may reference images not displayed]

ACR Breast Density Category c: The breast tissue is heterogeneously
dense, which may obscure small masses.
FINDINGS: The small masses in the lateral right and central to slightly
lateral left breast appear mammographically stable. No suspicious
calcifications, masses or areas of distortion are seen in the
bilateral breasts.

Mammographic images were processed with CAD.

Ultrasound targeted to the right breast at 9 o'clock, 2 cm from the
nipple demonstrates a superficial hypoechoic oval mass measuring 5 x
2 x 3 mm, previously 5 x 2 x 4 mm.

Adjacent to this mass at 9 o'clock, 4 cm from the nipple is an oval
hypoechoic mass measuring 6 x 2 x 3 mm. This mass may be a lymph
node, and I feel likely corresponds with the mass identified on the
initial baseline screening mammogram.

Ultrasound of the left breast at 4 o'clock, 2 cm from the nipple
demonstrates a stable hypoechoic oval mass measuring 5 x 3 x 3 mm,
previously measuring 4 x 2 x 3 mm.
IMPRESSION: 1. The probably benign masses in the right breast at 9 o'clock, 2 cm
from the nipple and in the left breast at 4 o'clock, 2 cm from the
nipple have been stable since Wednesday March, 2015. There is a mass identified on the ultrasound at 9 o'clock, 4 cm
from the nipple which could represent a small lymph node, and is
probably benign.

RECOMMENDATION:
1. Six-month follow-up ultrasound of the right breast is recommended
to document stability of the probably benign mass in the right
breast at 9 o'clock, 4 cm from the nipple.

2. If the above evaluation is stable, it should be followed by a
six-month bilateral diagnostic mammogram and bilateral ultrasound to
document 2 years of stability of the right breast mass at 9 o'clock,
2 cm from the nipple and the left breast mass at 4 o'clock, 2 cm
from the nipple. At that point, this would be 1 year of stability of
the right breast mass at 9 o'clock, 4 cm from the nipple.

I have discussed the findings and recommendations with the patient.
Results were also provided in writing at the conclusion of the
visit. If applicable, a reminder letter will be sent to the patient
regarding the next appointment.

BI-RADS CATEGORY  3: Probably benign.

## 2019-06-18 ENCOUNTER — Ambulatory Visit: Payer: Medicare Other

## 2019-06-18 ENCOUNTER — Other Ambulatory Visit: Payer: Medicare Other

## 2019-07-16 ENCOUNTER — Inpatient Hospital Stay (HOSPITAL_BASED_OUTPATIENT_CLINIC_OR_DEPARTMENT_OTHER): Payer: Medicare Other | Admitting: Obstetrics and Gynecology

## 2019-07-16 ENCOUNTER — Other Ambulatory Visit: Payer: Self-pay

## 2019-07-16 ENCOUNTER — Inpatient Hospital Stay: Payer: Medicare Other | Attending: Obstetrics and Gynecology

## 2019-07-16 VITALS — BP 138/60 | HR 58 | Temp 96.0°F | Resp 20 | Wt 126.9 lb

## 2019-07-16 DIAGNOSIS — N83201 Unspecified ovarian cyst, right side: Secondary | ICD-10-CM | POA: Diagnosis present

## 2019-07-16 DIAGNOSIS — R05 Cough: Secondary | ICD-10-CM | POA: Insufficient documentation

## 2019-07-16 DIAGNOSIS — R531 Weakness: Secondary | ICD-10-CM | POA: Diagnosis not present

## 2019-07-16 DIAGNOSIS — J449 Chronic obstructive pulmonary disease, unspecified: Secondary | ICD-10-CM | POA: Insufficient documentation

## 2019-07-16 DIAGNOSIS — G8929 Other chronic pain: Secondary | ICD-10-CM | POA: Diagnosis not present

## 2019-07-16 DIAGNOSIS — E871 Hypo-osmolality and hyponatremia: Secondary | ICD-10-CM | POA: Insufficient documentation

## 2019-07-16 DIAGNOSIS — I110 Hypertensive heart disease with heart failure: Secondary | ICD-10-CM | POA: Insufficient documentation

## 2019-07-16 DIAGNOSIS — M549 Dorsalgia, unspecified: Secondary | ICD-10-CM | POA: Diagnosis not present

## 2019-07-16 DIAGNOSIS — R971 Elevated cancer antigen 125 [CA 125]: Secondary | ICD-10-CM | POA: Insufficient documentation

## 2019-07-16 DIAGNOSIS — F1011 Alcohol abuse, in remission: Secondary | ICD-10-CM | POA: Insufficient documentation

## 2019-07-16 DIAGNOSIS — Z85118 Personal history of other malignant neoplasm of bronchus and lung: Secondary | ICD-10-CM | POA: Diagnosis not present

## 2019-07-16 DIAGNOSIS — F17210 Nicotine dependence, cigarettes, uncomplicated: Secondary | ICD-10-CM | POA: Insufficient documentation

## 2019-07-16 DIAGNOSIS — M810 Age-related osteoporosis without current pathological fracture: Secondary | ICD-10-CM | POA: Diagnosis not present

## 2019-07-16 DIAGNOSIS — I429 Cardiomyopathy, unspecified: Secondary | ICD-10-CM | POA: Diagnosis not present

## 2019-07-16 DIAGNOSIS — N838 Other noninflammatory disorders of ovary, fallopian tube and broad ligament: Secondary | ICD-10-CM

## 2019-07-16 DIAGNOSIS — N83202 Unspecified ovarian cyst, left side: Secondary | ICD-10-CM

## 2019-07-16 DIAGNOSIS — M40209 Unspecified kyphosis, site unspecified: Secondary | ICD-10-CM | POA: Diagnosis not present

## 2019-07-16 DIAGNOSIS — I447 Left bundle-branch block, unspecified: Secondary | ICD-10-CM | POA: Insufficient documentation

## 2019-07-16 DIAGNOSIS — R197 Diarrhea, unspecified: Secondary | ICD-10-CM | POA: Insufficient documentation

## 2019-07-16 NOTE — Progress Notes (Signed)
Pre-visit assessment notes:  Felicia Sellers c/o of recent bouts of diarrhea. She states that she she has stopped taking Protonix because it seemed to precipitate the diarrhea. She has also stopped her colace and added imodium. Otherwise, she has no concerns for today's visit.

## 2019-07-16 NOTE — Progress Notes (Signed)
Gynecologic Oncology Interval Visit   Referring Provider: Zeb Comfort, MD [address] [place],  [address] [phone]  Chief Concern: Bilateral ovarian masses  Subjective:  Felicia Sellers is a 78 y.o. female, initially seen in consultation from Dr. Pricilla Riffle for bilateral ovarian masses, who returns to clinic today for follow-up. At her last visit we discussed options for management options including observation versus surgery and she opted for observation.  She had her blood drawn this morning but did not have the ultrasound. She complains today of weakness, shortness of breath/cough, diarrhea due to her bladder medication, and chronic back pain. She is asymptomatic from the mass.   Gynecologic History:  Felicia Sellers is a pleasant female, initially seen in consultation from Dr. Dema Severin for bilateral ovarian masses, who returns to clinic today for follow-up.   US Abdomen 07/08/2018 1. Tiny stone versus tumefactive sludge within the gallbladder. Negative for acute cholecystitis or biliary dilatation 2. 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 Recommend CTA to more thoroughly evaluate the aorta.  CT scan 07/24/2018 1. Bilateral heterogeneous partially cystic and septated ovarian masses. Left ovarian complex mass measures 10.6 x 6.1 cm. Right ovarian mass measures 4.5 x 2.8 cm. No ascites or evidence of peritoneal disease by CT.  2. Atherosclerotic disease involving celiac, SMA, renals, IMA, and iliac vessels.  3. Diverticulosis without acute inflammatory process 4.  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   She was symptomatic with weight gain and abdominal pressure.  She has chronic shortness of breath and leg pain with walking.  Chronic cough.  History of kyphosis with chronic back and joint pain.  She reports distant history of lung cancer but has not  seen pulmonology in "long time, cannot remember his name".  CT finding of thoracic aneurysm prompted referral to Dr. Rodman Comp of alcohol abuse but quit drinking approximately 01/2018.   08/22/18- PET 1.  Complex cystic left adnexal lesion is photopenic with multi septal septations and varying density contents.  Correlation with multi modality imaging and tumor markers is recommended for management. 2.  Right ovarian lesion measures 4.4 x 2.8 cm, solid-appearing with low metabolic activity with maximum SUV of 2.6.  Considered to be abnormally large for ovary in this age group and low-grade activity could represent low-grade tumor.  Multimodality imaging such as ultrasound and tumor markers recommended. 3.  No evidence of metastatic spread in the abdomen. 4.  Several borderline enlarged mediastinal lymph nodes are present and have a low-grade accentuated metabolic activity mildly above blood pool.  Not characteristic for metastatic ovarian cancer but they merit surveillance with suggested follow-up of chest CT in 3 months. 5.  Faint nodularity in both upper lobes with largest nodule measuring 5 mm.  Not hypermetabolic but the low sensitive PET/threshold and recommend follow-up with CT. 6.  Other findings of potential clinical significance: Aortic arteriosclerosis, emphysema, coronary arteriosclerosis, descending thoracic aortic aneurysm, mild cardiomegaly, old granulomatous disease, sigmoid colon diverticulosis, lumbar spondylosis, degenerative disc disease.  Tumor markers:  CA 125 on 08/07/18 100 HE4 on 08/22/18 228 ROMA 72%   Radiology was unable to biopsy the lesion either by abdominal or vaginal approach and recommended PET scan.   After discussion of surgery vs conservative observation she opted for surveillance.  11/06/2018  CA125 = 78.5 (decreased compared to 100 on 08/07/2018) HE4=200 (decreased compared to 100 on 08/22/2018)  ROMA 65%  Pelvic  Ultrasound 11/06/2018. Large LEFT adnexal  mass 11.2 x 5.7 x 6.5 cm, containing 2 distinct regions, with diffuse homogeneous internal hypoechogenicity throughout a large region and contiguous hypoechoic minimally complicated cystic component. No internal blood flow is seen within this lesion on color Doppler imaging. Single small echogenic focus question calcification at wall.  RIGHT adnexal mass seen on the prior PET-CT and CT exams could not be demonstrated on either transabdominal or endovaginal imaging, likely obscured by bowel.  IMPRESSION: Large LEFT adnexal nonspecific complex cystic lesion 11.2 x 5.7 x 6.5 cm, a large portion of which demonstrates homogeneous hypoechogenicity throughout suggesting an endometrioma though there is an adjacent component of hypoechoic fluid; no internal blood flow within this LEFT adnexal mass by color Doppler imaging.    Other medical issues:  She was seen by cardiology, Dr. Saralyn Pilar, on 9/19, for history of nonischemic cardiomyopathy.  2D echo on 01/01/2018 revealed LVEF of 50% with moderate mitral and tricuspid regurgitation. She has intermittent peripheral edema with right worse than left.  She has chronic exertional dyspnea due to underlying COPD and ongoing tobacco abuse.    CT angios chest scheduled for 09/17/2018 to complete evaluation of aortic aneurysm.  She was seen by Dr. Lucky Cowboy, vascular surgery on 08/16/2018. He felt that the visualized portion would be a moderate aneurysm that would not need emergent repair. 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 Radiology recommended semi-annual imaging followup by CTA or MRA.   Problem List: Patient Active Problem List   Diagnosis Date Noted  . Thoracic aortic aneurysm without rupture ([place]) 08/16/2018  . Kyphosis 08/07/2018  . Hypertension 08/07/2018  . Tobacco use 08/07/2018  . Age-related osteoporosis without current pathological fracture 12/21/2016  . Chronic hyponatremia  11/04/2015  . Chronic low back pain 06/18/2015  . Cardiomyopathy ([place]) 08/13/2014  . Left bundle branch block (LBBB) 07/23/2014  . Pedal edema 07/23/2014  . SOB (shortness of breath) on exertion 07/23/2014    Past Medical History: Past Medical History:  Diagnosis Date  . 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 History of stomach ulcers   . Hypertension     Past Surgical History: Past Surgical History:  Procedure Laterality Date  . LUNG REMOVAL, PARTIAL Left     Past Gynecologic History:  G1P4 Distant history of birth control pill use Denies history of STIs or abnormal pap smears  OB History:  OB History  No obstetric history on file.    Family History: Family History  Problem Relation Age of Onset  . Breast cancer Neg Hx     Social History: Social History   Socioeconomic History  . Marital status: Single    Spouse name: Not on file  . Number of children: Not on file  . Years of education: Not on file  . Highest education level: Not on file  Occupational History  . Not on file  Social Needs  . Financial resource strain: Not on file  . Food insecurity    Worry: Not on file    Inability: Not on file  . Transportation needs    Medical: Not on file    Non-medical: Not on file  Tobacco Use  . Smoking status: Current Every Day Smoker    Packs/day: 1.00    Years: 66.00    Pack years: 66.00  . Smokeless tobacco: Never Used  Substance and Sexual Activity  . Alcohol  use: Not Currently    Comment: use to drink alot ( vodka) 6 months ago  . Drug use: Never  . Sexual activity: Not Currently  Lifestyle  . Physical activity    Days per week: Not on file    Minutes per session: Not on file  . Stress: Not on file  Relationships  . Social Herbalist on phone: Not on file    Gets together: Not on file    Attends religious service: Not on file    Active member of club or organization: Not on file    Attends meetings of  clubs or organizations: Not on file    Relationship status: Not on file  . Intimate partner violence    Fear of current or ex partner: Not on file    Emotionally abused: Not on file    Physically abused: Not on file    Forced sexual activity: Not on file  Other Topics Concern  . Not on file  Social History Narrative  . Not on file    Allergies: No Known Allergies  Review of Systems General:  weakness Skin: no complaints Eyes: no complaints HEENT: no complaints Breasts: no complaints Pulmonary: Chronic dyspnea with exertion: Smoker. Cough Cardiac: Per HPI Gastrointestinal: diarrhea due to her bladder medication; negative or bloating, nausea, or constipation.  Genitourinary/Sexual: no complaints Ob/Gyn: no complaints Musculoskeletal: Back pain per HPI Hematology: no complaints Neurologic/Psych: no complaints   Objective:  Physical Examination:  BP 138/60 (BP Location: Left Arm, Patient Position: Sitting)   Pulse (!) 58   Temp (!) 96 F (35.6 C)   Resp 20   Wt 126 lb 14.4 oz (57.6 kg)   BMI 22.48 kg/m    ECOG Performance Status: 2 - Symptomatic, <50% confined to bed  GENERAL: Patient is a well appearing female in no acute distress HEENT:  PERRL, atraumatic and normocephalic; anicteric NODES:  No inguinal lymphadenopathy palpated.  ABDOMEN:  Soft, nontender.  Positive, normoactive bowel sounds. No ascites or mass or hepatosplenomegaly EXTREMITIES:  No peripheral edema.   MSK: Kyphosis.  SKIN:  Clear with no obvious rashes or skin changes. No nail dyscrasia.  Pelvic: exam chaperoned by nurse EGBUS: normal appearing vulva with no masses, tenderness or lesions Cervix: no lesions, nontender, mobile Vagina: no lesions, no discharge or bleeding Uterus: normal size, nontender, mobile Adnexa: right adnexal fullness and firm area adjacent to the uterus. On the left, unable to determine size but very nodularity and firm. Uable to palpate the entire lesion. Rectovaginal:  confirmatory - the mass seems above the cul-de-sac   Lab Review As per HPI  Radiologic Imaging: As per HPI     Assessment:  Felicia Sellers is a 78 y.o. female diagnosed with symptomatic bilateral adnexal masses concerning for malignancy (Partially cystic and septated ovarian masses. Left ovarian complex mass measures 10.6 x 6.1 cm. Right ovarian mass measures 4.5 x 2.8 cm). No ascites which is reassuring. Elevated CA125, HE4 and ROMA score. CA125 my be elevated due to CHF. Repeat tumor markers decreased and on imaging cystic masses are overall stable on the left and unable to visualize on the right. She remains asymptomatic. Exam concerning for increased disease.  She has significant co-morbid conditions and poor surgical candidate.   Medical co-morbidities complicating care: HTN, Tobacco use, Cardiomyopathy ([place]), s/p lung resection, diffuse atherosclerosis, and abdominal aortic aneurysm up to 4.6 cm.  Plan:   Problem List Items Addressed This Visit    None  Visit Diagnoses    Ovarian mass    -  Primary   Relevant Orders   US PELVIC COMPLETE WITH TRANSVAGINAL      Follow up tumor markers, and repeat radiologic images. We willl follow up results. If stable then continue observation and suggested return to clinic in 6 months for repeat US, CA125, and HE4. If worsening mass then consider radiologic guided biopsy.   A total of 20 minutes were spent with the patient; >50% was spent in education, counseling and coordination of care for symptomatic bilateral adnexal masses concerning for malignancy.   [place] Gaetana Michaelis, MD      CC:  Zeb Comfort, MD [address] [place], [place] [address] [phone]

## 2019-07-17 LAB — CA 125: Cancer Antigen (CA) 125: 74.1 U/mL — ABNORMAL HIGH (ref 0.0–38.1)

## 2019-07-18 LAB — HUMAN EPIDIDYMIS PROT 4,SERIAL: HE4: 200 pmol/L — ABNORMAL HIGH (ref 0.0–96.9)

## 2019-07-21 ENCOUNTER — Other Ambulatory Visit: Payer: Self-pay

## 2019-07-21 ENCOUNTER — Ambulatory Visit
Admission: RE | Admit: 2019-07-21 | Discharge: 2019-07-21 | Disposition: A | Discharge status: Home / Self Care | Payer: Medicare Other | Source: Ambulatory Visit | Attending: Obstetrics and Gynecology | Admitting: Obstetrics and Gynecology

## 2019-07-21 DIAGNOSIS — N838 Other noninflammatory disorders of ovary, fallopian tube and broad ligament: Secondary | ICD-10-CM | POA: Diagnosis not present

## 2019-07-22 ENCOUNTER — Telehealth: Payer: Self-pay

## 2019-07-22 NOTE — Telephone Encounter (Signed)
Results of U/S, CA125, and HE4 sent to Dr. [name] for review.

## 2019-07-24 ENCOUNTER — Telehealth: Payer: Self-pay

## 2019-07-24 NOTE — Telephone Encounter (Signed)
Voicemail left with Ms. Felicia Sellers with U/S and lab results. We will arrange a one year follow up per Dr. [name]. Asked [name] to return call to discuss the above.

## 2019-08-01 ENCOUNTER — Telehealth: Payer: Self-pay

## 2019-08-01 DIAGNOSIS — N838 Other noninflammatory disorders of ovary, fallopian tube and broad ligament: Secondary | ICD-10-CM

## 2019-08-01 NOTE — Telephone Encounter (Signed)
Called and discussed results of U/S and tumor markers. Per Dr. [name] she can return in one year for repeat U/S and exam. If she prefers, we can also have her return in 6 months. Ms. Felicia Sellers prefers one year. Instructed that we can always see her sooner if she starts having any issues. Orders placed for U/S and scheduling notified.

## 2019-09-30 ENCOUNTER — Telehealth: Payer: Self-pay

## 2019-09-30 NOTE — Telephone Encounter (Signed)
Appointments for [place] and gyn onc follow up, September 2021, mailed to Ms. Ruddick.

## 2020-06-26 ENCOUNTER — Inpatient Hospital Stay: Payer: Medicare Other

## 2020-06-26 ENCOUNTER — Other Ambulatory Visit: Payer: Self-pay

## 2020-06-26 ENCOUNTER — Emergency Department: Payer: Medicare Other

## 2020-06-26 ENCOUNTER — Inpatient Hospital Stay
Admission: EM | Admit: 2020-06-26 | Discharge: 2020-06-28 | DRG: 871 | Disposition: A | Discharge status: Home Health | Payer: Medicare Other | Attending: Family Medicine | Admitting: Family Medicine

## 2020-06-26 DIAGNOSIS — E871 Hypo-osmolality and hyponatremia: Secondary | ICD-10-CM | POA: Diagnosis present

## 2020-06-26 DIAGNOSIS — J441 Chronic obstructive pulmonary disease with (acute) exacerbation: Secondary | ICD-10-CM | POA: Diagnosis present

## 2020-06-26 DIAGNOSIS — Z7983 Long term (current) use of bisphosphonates: Secondary | ICD-10-CM

## 2020-06-26 DIAGNOSIS — G629 Polyneuropathy, unspecified: Secondary | ICD-10-CM | POA: Diagnosis present

## 2020-06-26 DIAGNOSIS — I447 Left bundle-branch block, unspecified: Secondary | ICD-10-CM | POA: Diagnosis present

## 2020-06-26 DIAGNOSIS — Z85118 Personal history of other malignant neoplasm of bronchus and lung: Secondary | ICD-10-CM

## 2020-06-26 DIAGNOSIS — I509 Heart failure, unspecified: Secondary | ICD-10-CM

## 2020-06-26 DIAGNOSIS — I5032 Chronic diastolic (congestive) heart failure: Secondary | ICD-10-CM | POA: Diagnosis present

## 2020-06-26 DIAGNOSIS — I429 Cardiomyopathy, unspecified: Secondary | ICD-10-CM | POA: Diagnosis present

## 2020-06-26 DIAGNOSIS — K219 Gastro-esophageal reflux disease without esophagitis: Secondary | ICD-10-CM | POA: Diagnosis present

## 2020-06-26 DIAGNOSIS — I10 Essential (primary) hypertension: Secondary | ICD-10-CM | POA: Diagnosis present

## 2020-06-26 DIAGNOSIS — D72829 Elevated white blood cell count, unspecified: Secondary | ICD-10-CM

## 2020-06-26 DIAGNOSIS — Z79899 Other long term (current) drug therapy: Secondary | ICD-10-CM | POA: Diagnosis not present

## 2020-06-26 DIAGNOSIS — A419 Sepsis, unspecified organism: Secondary | ICD-10-CM | POA: Diagnosis not present

## 2020-06-26 DIAGNOSIS — J9601 Acute respiratory failure with hypoxia: Secondary | ICD-10-CM | POA: Diagnosis present

## 2020-06-26 DIAGNOSIS — Z902 Acquired absence of lung [part of]: Secondary | ICD-10-CM | POA: Diagnosis not present

## 2020-06-26 DIAGNOSIS — I2510 Atherosclerotic heart disease of native coronary artery without angina pectoris: Secondary | ICD-10-CM | POA: Diagnosis present

## 2020-06-26 DIAGNOSIS — Z23 Encounter for immunization: Secondary | ICD-10-CM

## 2020-06-26 DIAGNOSIS — Z20822 Contact with and (suspected) exposure to covid-19: Secondary | ICD-10-CM | POA: Diagnosis present

## 2020-06-26 DIAGNOSIS — Z8711 Personal history of peptic ulcer disease: Secondary | ICD-10-CM | POA: Diagnosis not present

## 2020-06-26 DIAGNOSIS — J440 Chronic obstructive pulmonary disease with acute lower respiratory infection: Secondary | ICD-10-CM | POA: Diagnosis present

## 2020-06-26 DIAGNOSIS — I110 Hypertensive heart disease with heart failure: Secondary | ICD-10-CM | POA: Diagnosis present

## 2020-06-26 DIAGNOSIS — Z791 Long term (current) use of non-steroidal anti-inflammatories (NSAID): Secondary | ICD-10-CM

## 2020-06-26 DIAGNOSIS — R0902 Hypoxemia: Secondary | ICD-10-CM

## 2020-06-26 DIAGNOSIS — F1010 Alcohol abuse, uncomplicated: Secondary | ICD-10-CM | POA: Diagnosis present

## 2020-06-26 DIAGNOSIS — Z720 Tobacco use: Secondary | ICD-10-CM | POA: Diagnosis present

## 2020-06-26 DIAGNOSIS — F17210 Nicotine dependence, cigarettes, uncomplicated: Secondary | ICD-10-CM | POA: Diagnosis present

## 2020-06-26 DIAGNOSIS — J189 Pneumonia, unspecified organism: Secondary | ICD-10-CM | POA: Diagnosis present

## 2020-06-26 HISTORY — DX: Atherosclerotic heart disease of native coronary artery without angina pectoris: I25.10

## 2020-06-26 HISTORY — DX: Chronic obstructive pulmonary disease, unspecified: J44.9

## 2020-06-26 LAB — LIPASE, BLOOD: Lipase: 20 U/L (ref 11–51)

## 2020-06-26 LAB — CBC
HCT: 38.7 % (ref 36.0–46.0)
Hemoglobin: 13.4 g/dL (ref 12.0–15.0)
MCH: 32.8 pg (ref 26.0–34.0)
MCHC: 34.6 g/dL (ref 30.0–36.0)
MCV: 94.6 fL (ref 80.0–100.0)
Platelets: 202 10*3/uL (ref 150–400)
RBC: 4.09 MIL/uL (ref 3.87–5.11)
RDW: 13.7 % (ref 11.5–15.5)
WBC: 17.6 10*3/uL — ABNORMAL HIGH (ref 4.0–10.5)
nRBC: 0 % (ref 0.0–0.2)

## 2020-06-26 LAB — COMPREHENSIVE METABOLIC PANEL
ALT: 9 U/L (ref 0–44)
AST: 17 U/L (ref 15–41)
Albumin: 3.8 g/dL (ref 3.5–5.0)
Alkaline Phosphatase: 72 U/L (ref 38–126)
Anion gap: 9 (ref 5–15)
BUN: 10 mg/dL (ref 8–23)
CO2: 26 mmol/L (ref 22–32)
Calcium: 9.3 mg/dL (ref 8.9–10.3)
Chloride: 97 mmol/L — ABNORMAL LOW (ref 98–111)
Creatinine, Ser: 0.95 mg/dL (ref 0.44–1.00)
GFR calc Af Amer: >60 mL/min (ref >60)
GFR calc non Af Amer: 56 mL/min — ABNORMAL LOW (ref >60)
Glucose, Bld: 97 mg/dL (ref 70–99)
Potassium: 3.9 mmol/L (ref 3.5–5.1)
Sodium: 132 mmol/L — ABNORMAL LOW (ref 135–145)
Total Bilirubin: 1.3 mg/dL — ABNORMAL HIGH (ref 0.3–1.2)
Total Protein: 6.9 g/dL (ref 6.5–8.1)

## 2020-06-26 LAB — TROPONIN I (HIGH SENSITIVITY)
Troponin I (High Sensitivity): 11 ng/L (ref <18)
Troponin I (High Sensitivity): 11 ng/L (ref <18)

## 2020-06-26 LAB — SARS CORONAVIRUS 2 BY RT PCR (HOSPITAL ORDER, PERFORMED IN ~~LOC~~ HOSPITAL LAB): SARS Coronavirus 2: NEGATIVE

## 2020-06-26 LAB — BRAIN NATRIURETIC PEPTIDE: B Natriuretic Peptide: 349.6 pg/mL — ABNORMAL HIGH (ref 0.0–100.0)

## 2020-06-26 MED ORDER — IOHEXOL 350 MG/ML SOLN
75.0000 mL | Freq: Once | INTRAVENOUS | Status: AC | PRN
  Administered 2020-06-26: 75 mL via INTRAVENOUS

## 2020-06-26 MED ORDER — FUROSEMIDE 10 MG/ML IJ SOLN
40.0000 mg | Freq: Once | INTRAMUSCULAR | Status: AC
  Administered 2020-06-26: 40 mg via INTRAVENOUS
  Filled 2020-06-26: qty 4

## 2020-06-26 NOTE — ED Provider Notes (Signed)
[facility] Emergency Department Provider Note    None    (approximate)  I have reviewed the triage vital signs and the nursing notes.   HISTORY  Chief Complaint Shortness of Breath    HPI Felicia Sellers is a 78 y.o. female close past medical history presents to the ER for worsening shortness of breath congestion and fatigue.  Patient does not wear home oxygen was found to be hypoxic on room air.  States that she wanted to wait it out through the weekend but her family encouraged her to come to the ER due to significant dyspnea particularly with any exertion.    Past Medical History:  Diagnosis Date  . Cancer ([place])    right lung removed - lung cancer 1/3 lung removed   . History of stomach ulcers   . Hypertension    Family History  Problem Relation Age of Onset  . Breast cancer Neg Hx    Past Surgical History:  Procedure Laterality Date  . LUNG REMOVAL, PARTIAL Left    Patient Active Problem List   Diagnosis Date Noted  . Thoracic aortic aneurysm without rupture ([place]) 08/16/2018  . Kyphosis 08/07/2018  . Hypertension 08/07/2018  . Tobacco use 08/07/2018  . Age-related osteoporosis without current pathological fracture 12/21/2016  . Chronic hyponatremia 11/04/2015  . Chronic low back pain 06/18/2015  . Cardiomyopathy ([place]) 08/13/2014  . Left bundle branch block (LBBB) 07/23/2014  . Pedal edema 07/23/2014  . SOB (shortness of breath) on exertion 07/23/2014      Prior to Admission medications   Medication Sig Start Date End Date Taking? Authorizing Provider  Albuterol Sulfate 108 (90 Base) MCG/ACT AEPB Inhale 2 puffs into the lungs every 6 (six) hours as needed.    [provider]  alendronate (FOSAMAX) 70 MG tablet Take 70 mg by mouth once a week. Take with a full glass of water on an empty stomach.    [provider]  amLODipine (NORVASC) 10 MG tablet Take 10 mg by mouth daily.    [provider]  atorvastatin  (LIPITOR) 40 MG tablet Take 40 mg by mouth daily.    [provider]  calcium carbonate (OS-CAL - DOSED IN MG OF ELEMENTAL CALCIUM) 1250 (500 Ca) MG tablet Take 1 tablet by mouth.    [provider]  Cholecalciferol 1000 units capsule Take 1,000 Units by mouth daily.    [provider]  docusate sodium (COLACE) 100 MG capsule Take 100 mg by mouth daily.    [provider]  gabapentin (NEURONTIN) 300 MG capsule Take 300 mg by mouth 3 (three) times daily.    [provider]  lisinopril (PRINIVIL,ZESTRIL) 5 MG tablet Take 5 mg by mouth daily.    [provider]  loperamide (IMODIUM) 2 MG capsule Take 4 mg by mouth as needed for diarrhea or loose stools.    [provider]  meloxicam (MOBIC) 7.5 MG tablet meloxicam 7.5 mg tablet  TK 1 T PO  D PRN    [provider]  metoprolol succinate (TOPROL-XL) 25 MG 24 hr tablet Take 25 mg by mouth daily.    [provider]  pantoprazole (PROTONIX) 40 MG tablet Take 40 mg by mouth daily.    [provider]    Allergies Patient has no known allergies.    Social History Social History   Tobacco Use  . Smoking status: Current Every Day Smoker    Packs/day: 1.00  Years: 66.00    Pack years: 66.00  . Smokeless tobacco: Never Used  Vaping Use  . Vaping Use: Never used  Substance Use Topics  . Alcohol use: Not Currently    Comment: use to drink alot ( vodka) 6 months ago  . Drug use: Never    Review of Systems Patient denies headaches, rhinorrhea, blurry vision, numbness, shortness of breath, chest pain, edema, cough, abdominal pain, nausea, vomiting, diarrhea, dysuria, fevers, rashes or hallucinations unless otherwise stated above in HPI. ____________________________________________   PHYSICAL EXAM:  VITAL SIGNS: Vitals:   06/26/20 2105 06/26/20 2130  BP: 100/67 (!) 105/58  Pulse: 64 60  Resp: (!) 21 (!) 23  Temp: 98.6 F (37 C)   SpO2: (!) 89% 99%     Constitutional: Alert and oriented.  Eyes: Conjunctivae are normal.  Head: Atraumatic. Nose: No congestion/rhinnorhea. Mouth/Throat: Mucous membranes are moist.   Neck: No stridor. Painless ROM.  Cardiovascular: Normal rate, regular rhythm. Grossly normal heart sounds.  Good peripheral circulation. Respiratory:mild tachypnea with inspiratory crackles throughout Gastrointestinal: Soft and nontender. No distention. No abdominal bruits. No CVA tenderness. Genitourinary:  Musculoskeletal: No lower extremity tenderness nor edema.  No joint effusions. Neurologic:  Normal speech and language. No gross focal neurologic deficits are appreciated. No facial droop Skin:  Skin is warm, dry and intact. No rash noted. Psychiatric: Mood and affect are normal. Speech and behavior are normal.  ____________________________________________   LABS (all labs ordered are listed, but only abnormal results are displayed)  Results for orders placed or performed during the hospital encounter of 06/26/20 (from the past 24 hour(s))  CBC     Status: Abnormal   Collection Time: 06/26/20  8:58 PM  Result Value Ref Range   WBC 17.6 (H) 4.0 - 10.5 K/uL   RBC 4.09 3.87 - 5.11 MIL/uL   Hemoglobin 13.4 12.0 - 15.0 g/dL   HCT 38.7 36 - 46 %   MCV 94.6 80.0 - 100.0 fL   MCH 32.8 26.0 - 34.0 pg   MCHC 34.6 30.0 - 36.0 g/dL   RDW 13.7 11.5 - 15.5 %   Platelets 202 150 - 400 K/uL   nRBC 0.0 0.0 - 0.2 %  Troponin I (High Sensitivity)     Status: None   Collection Time: 06/26/20  8:58 PM  Result Value Ref Range   Troponin I (High Sensitivity) 11 <18 ng/L  Comprehensive metabolic panel     Status: Abnormal   Collection Time: 06/26/20  8:58 PM  Result Value Ref Range   Sodium 132 (L) 135 - 145 mmol/L   Potassium 3.9 3.5 - 5.1 mmol/L   Chloride 97 (L) 98 - 111 mmol/L   CO2 26 22 - 32 mmol/L   Glucose, Bld 97 70 - 99 mg/dL   BUN 10 8 - 23 mg/dL   Creatinine, Ser 0.95 0.44 - 1.00 mg/dL   Calcium 9.3 8.9 - 10.3  mg/dL   Total Protein 6.9 6.5 - 8.1 g/dL   Albumin 3.8 3.5 - 5.0 g/dL   AST 17 15 - 41 U/L   ALT 9 0 - 44 U/L   Alkaline Phosphatase 72 38 - 126 U/L   Total Bilirubin 1.3 (H) 0.3 - 1.2 mg/dL   GFR calc non Af Amer 56 (L) >60 mL/min   GFR calc Af Amer >60 >60 mL/min   Anion gap 9 5 - 15  Lipase, blood     Status: None   Collection Time: 06/26/20  8:58  PM  Result Value Ref Range   Lipase 20 11 - 51 U/L  SARS Coronavirus 2 by RT PCR (hospital order, performed in [facility] hospital lab) Nasopharyngeal Nasopharyngeal Swab     Status: None   Collection Time: 06/26/20  9:37 PM   Specimen: Nasopharyngeal Swab  Result Value Ref Range   SARS Coronavirus 2 NEGATIVE NEGATIVE   ____________________________________________  EKG My review and personal interpretation at Time: 21:04     Indication: sob  Rate: 60  Rhythm: sinus Axis: normal Other: lbbb, no stemi, abnormal ekg ____________________________________________  RADIOLOGY  I personally reviewed all radiographic images ordered to evaluate for the above acute complaints and reviewed radiology reports and findings.  These findings were personally discussed with the patient.  Please see medical record for radiology report.  ____________________________________________   PROCEDURES  Procedure(s) performed:  .Critical Care Performed by: Merlyn Lot, MD Authorized by: Merlyn Lot, MD   Critical care provider statement:    Critical care time (minutes):  33   Critical care time was exclusive of:  Separately billable procedures and treating other patients   Critical care was necessary to treat or prevent imminent or life-threatening deterioration of the following conditions:  Respiratory failure   Critical care was time spent personally by me on the following activities:  Development of treatment plan with patient or surrogate, discussions with consultants, evaluation of patient's response to treatment, examination of  patient, obtaining history from patient or surrogate, ordering and performing treatments and interventions, ordering and review of laboratory studies, ordering and review of radiographic studies, pulse oximetry, re-evaluation of patient's condition and review of old charts      Critical Care performed: yes ____________________________________________   INITIAL IMPRESSION / [place] / ED COURSE  Pertinent labs & imaging results that were available during my care of the patient were reviewed by me and considered in my medical decision making (see chart for details).   DDX: covid 10, Asthma, copd, CHF, pna, ptx, malignancy, Pe, anemia   Felicia Sellers is a 79 y.o. who presents to the ED with symptoms as described above.  Patient is found to be hypoxic with acute respiratory failure improved with supplemental oxygen.  Her exam respiratory features are consistent with congestive heart failure.  Covid is negative.  Does have leukocytosis.  Have a lower suspicion for pneumonia based on imaging and work-up.  I will give dose of Lasix.  Will discuss with hospitalist for admission.     The patient was evaluated in Emergency Department today for the symptoms described in the history of present illness. He/she was evaluated in the context of the global COVID-19 pandemic, which necessitated consideration that the patient might be at risk for infection with the SARS-CoV-2 virus that causes COVID-19. Institutional protocols and algorithms that pertain to the evaluation of patients at risk for COVID-19 are in a state of rapid change based on information released by regulatory bodies including the CDC and federal and state organizations. These policies and algorithms were followed during the patient's care in the ED.  As part of my medical decision making, I reviewed the following data within the [facility] notes reviewed and incorporated, Labs reviewed, notes from prior ED  visits and [place] Controlled Substance Database   ____________________________________________   FINAL CLINICAL IMPRESSION(S) / ED DIAGNOSES  Final diagnoses:  Acute respiratory failure with hypoxia ([place])      NEW MEDICATIONS STARTED DURING THIS VISIT:  New Prescriptions   No  medications on file     Note:  This document was prepared using Dragon voice recognition software and may include unintentional dictation errors.    Merlyn Lot, MD 06/26/20 2300

## 2020-06-26 NOTE — ED Notes (Signed)
Admitting provider at bedside.

## 2020-06-26 NOTE — ED Notes (Signed)
Patient transported to CT 

## 2020-06-26 NOTE — H&P (Signed)
History and Physical    Felicia Sellers GHW:[id number] DOB: 01/22/1941 DOA: 06/26/2020  PCP: Ricardo Jericho, NP   Patient coming from: Home  Chief Complaint: SOB and generalized weakness and fatigue  HPI: Felicia Sellers is a 79 y.o. female with medical history significant for COPD, hypertension, CAD, history of lung cancer with portion of right lung removed surgically.  Felicia Sellers reports that this afternoon she generalized chills and felt tired and weak.  She developed shortness of breath which was worsened by exertion.  She states that when she woke up this morning she felt fine but later in the afternoon started to feel rundown and had increased fatigue and shortness of breath with walking across her room.  She took a nap and when she woke up from the nap she had chills and states that she has felt cold.  Shortness of breath worsened and her family encouraged her to come to the hospital for evaluation.  She does not use oxygen at home.  She reports that she did use her albuterol inhaler but it did not relieve her shortness of breath.  When she arrived to the emergency room she was found to be hypoxic and was placed on oxygen by nasal cannula.  She reports she has not had any chest pain, palpitations, abdominal pain, nausea, vomiting, diarrhea, urinary frequency or dysuria, syncope.  She reports that she had the Covid vaccine in May and June.  She has had no known sick contacts and has not been exposed anyone with known Covid infection. She continues to smoke 1/2 to 1 pack of cigarettes a day.  She denies alcohol or illicit drug use.  ED Course:   Felicia Sellers was found to be hypoxic when she arrived was placed on oxygen by nasal cannula which resolved the hypoxia.  She was found to have an elevated white blood cell count of 17,000.  Chest x-ray did not reveal any infiltrate or consolidation.  Urinalysis been ordered and is pending.  At this time there is no source of the elevated white blood cell  count.  Patient is otherwise hemodynamically stable.  CT angiography has been ordered to make sure there is no pulmonary embolism causing the hypoxia and to make sure there is no underlying pneumonia that was not visible on chest x-ray.  Review of Systems:  General: Reports generalized weakness.  Has had chills.  Denies fever, weight loss, night sweats.  Denies dizziness.  Denies change in appetite HENT: Denies head trauma, headache, denies change in hearing, tinnitus.  Denies nasal congestion or bleeding.  Denies sore throat, sores in mouth.  Denies difficulty swallowing Eyes: Denies blurry vision, pain in eye, drainage.  Denies discoloration of eyes. Neck: Denies pain.  Denies swelling.  Denies pain with movement. Cardiovascular: Denies chest pain, palpitations.  Denies edema.  Denies orthopnea Respiratory: Reports shortness of breath with chronic dry cough.  Denies wheezing.  Denies sputum production Gastrointestinal: Denies abdominal pain, swelling.  Denies nausea, vomiting, diarrhea.  Denies melena.  Denies hematemesis. Musculoskeletal: Denies limitation of movement.  Denies deformity or swelling.  Denies pain.  Denies arthralgias or myalgias. Genitourinary: Denies pelvic pain.  Denies urinary frequency or hesitancy.  Denies dysuria.  Skin: Denies rash.  Denies petechiae, purpura, ecchymosis. Neurological: Denies headache.  Denies syncope.  Denies seizure activity.  Denies paresthesia.  Denies slurred speech, drooping face.  Denies visual change. Psychiatric: Denies depression, anxiety.  Denies suicidal thoughts or ideation.  Denies hallucinations.  Past Medical History:  Diagnosis  Date  . Cancer [facility])    right lung removed - lung cancer 1/3 lung removed   . COPD (chronic obstructive pulmonary disease) ([place])   . Coronary artery disease   . History of stomach ulcers   . Hypertension     Past Surgical History:  Procedure Laterality Date  . LUNG REMOVAL, PARTIAL Left     Social  History  reports that she has been smoking. She has a 66.00 pack-year smoking history. She has never used smokeless tobacco. She reports previous alcohol use. She reports that she does not use drugs.  No Known Allergies  Family History  Problem Relation Age of Onset  . Breast cancer Neg Hx      Prior to Admission medications   Medication Sig Start Date End Date Taking? Authorizing Provider  Albuterol Sulfate 108 (90 Base) MCG/ACT AEPB Inhale 2 puffs into the lungs every 6 (six) hours as needed.    [provider]  alendronate (FOSAMAX) 70 MG tablet Take 70 mg by mouth once a week. Take with a full glass of water on an empty stomach.    [provider]  amLODipine (NORVASC) 10 MG tablet Take 10 mg by mouth daily.    [provider]  atorvastatin (LIPITOR) 40 MG tablet Take 40 mg by mouth daily.    [provider]  calcium carbonate (OS-CAL - DOSED IN MG OF ELEMENTAL CALCIUM) 1250 (500 Ca) MG tablet Take 1 tablet by mouth.    [provider]  Cholecalciferol 1000 units capsule Take 1,000 Units by mouth daily.    [provider]  docusate sodium (COLACE) 100 MG capsule Take 100 mg by mouth daily.    [provider]  gabapentin (NEURONTIN) 300 MG capsule Take 300 mg by mouth 3 (three) times daily.    [provider]  lansoprazole (PREVACID) 30 MG capsule Take 30 mg by mouth daily. 05/22/20   [provider]  lisinopril (PRINIVIL,ZESTRIL) 5 MG tablet Take 5 mg by mouth daily.    [provider]  loperamide (IMODIUM) 2 MG capsule Take 4 mg by mouth as needed for diarrhea or loose stools.    [provider]  meloxicam (MOBIC) 7.5 MG tablet meloxicam 7.5 mg tablet  TK 1 T PO  D PRN    [provider]  metoprolol succinate (TOPROL-XL) 25 MG 24 hr tablet Take 25 mg by mouth daily.    [provider]  pantoprazole (PROTONIX) 40 MG tablet Take 40 mg by mouth daily.    [provider]    Physical Exam: Vitals:   06/26/20 2130 06/26/20 2200 06/26/20 2230 06/26/20 2300  BP: (!) 105/58 105/62  (!) 135/57  Pulse: 60 66 (!) 59 66  Resp: (!) 23 (!) 22 (!) 25 (!) 22  Temp:      SpO2: 99% 99% 100% 98%  Weight:      Height:        Constitutional: NAD, calm, comfortable Vitals:   06/26/20 2130 06/26/20 2200 06/26/20 2230 06/26/20 2300  BP: (!) 105/58 105/62  (!) 135/57  Pulse: 60 66 (!) 59 66  Resp: (!) 23 (!) 22 (!) 25 (!) 22  Temp:      SpO2: 99% 99% 100% 98%  Weight:      Height:       General: WDWN, thin elderly female. Alert and oriented x3.  Eyes: EOMI, PERRL, lids and conjunctivae normal.  Sclera nonicteric HENT:  [place]/AT, external ears normal.  Nares patent without epistasis.  Mucous membranes are moist. Posterior pharynx clear of any exudate or lesions  Neck: Soft, normal range of motion, supple, no masses, no thyromegaly.  Trachea midline Respiratory: Breath sounds diminished but equal.  Diffuse scattered rales.  Mild expiratory wheezing.  No rhonchi.  no crackles. Normal respiratory effort. No accessory muscle use.  Cardiovascular: Regular rate and rhythm, no murmurs / rubs / gallops. No extremity edema. 1+ pedal pulses. No carotid bruits.  Abdomen: Soft, no tenderness, nondistended, no rebound or guarding.  No masses palpated. No hepatosplenomegaly. Bowel sounds normoactive Musculoskeletal: FROM.  Has clubbing of digits.  No cyanosis. No joint deformity upper and lower extremities. Normal muscle tone.  Skin: Warm, dry, intact no rashes, lesions, ulcers. No induration Neurologic: CN 2-12 grossly intact.  Normal speech.  Sensation intact, patella DTR +1 bilaterally. Strength 4/5 in all extremities.   Psychiatric: Normal judgment and insight.  Normal mood.    Labs on Admission: I have personally reviewed following labs and imaging studies  CBC: Recent Labs  Lab 06/26/20 2058  WBC 17.6*  HGB 13.4  HCT 38.7  MCV 94.6  PLT 202    Basic  Metabolic Panel: Recent Labs  Lab 06/26/20 2058  NA 132*  K 3.9  CL 97*  CO2 26  GLUCOSE 97  BUN 10  CREATININE 0.95  CALCIUM 9.3    GFR: Estimated Creatinine Clearance: 38.4 mL/min (by C-G formula based on SCr of 0.95 mg/dL).  Liver Function Tests: Recent Labs  Lab 06/26/20 2058  AST 17  ALT 9  ALKPHOS 72  BILITOT 1.3*  PROT 6.9  ALBUMIN 3.8    Urine analysis:    Component Value Date/Time   COLORURINE Yellow 05/28/2014 0855   APPEARANCEUR Clear 05/28/2014 0855   LABSPEC 1.013 05/28/2014 0855   PHURINE 6.0 05/28/2014 0855   GLUCOSEU Negative 05/28/2014 0855   HGBUR 1+ 05/28/2014 0855   BILIRUBINUR Negative 05/28/2014 0855   KETONESUR Negative 05/28/2014 0855   PROTEINUR Negative 05/28/2014 0855   NITRITE Negative 05/28/2014 0855   LEUKOCYTESUR Negative 05/28/2014 0855    Radiological Exams on Admission: DG Chest Portable 1 View  Result Date: 06/26/2020 CLINICAL DATA:  Shortness of breath EXAM: PORTABLE CHEST 1 VIEW COMPARISON:  None. FINDINGS: The heart size and mediastinal contours are mildly enlarged. Aortic knob calcifications are seen. There is prominence of the central pulmonary vasculature. No large airspace consolidation is seen. No acute osseous abnormality. IMPRESSION: Cardiomegaly and pulmonary vascular congestion. Electronically Signed   By: Prudencio Pair M.D.   On: 06/26/2020 21:41    EKG: Independently reviewed.  EKG shows normal sinus rhythm with left bundle branch block.  QTc 451.  No acute ST elevation or depression.  Assessment/Plan Principal Problem:   Acute CHF (congestive heart failure) ([place]) Felicia Sellers will be admitted to cardiac unit.  Check serial troponin levels make sure ischemia not etiology of acute CHF. Obtain echocardiogram the morning to evaluate wall motion, valvular function and EF. Diurese with Lasix 40 mg twice daily.  Monitor I&O's.  Monitor daily weight. Continue be with metoprolol, lisinopril, aspirin.  Continue statin  with Lipitor.  Active Problems:   Cardiomyopathy [facility]) Patient with chronic cardiomyopathy.  Echocardiogram ordered for morning    Hypertension Continue current therapy with metoprolol, lisinopril and amlodipine.  Monitor blood pressure.     Hypoxia Supplement oxygen as needed to keep O2 sat 92-96%. Albuterol as needed. Incentive spirometer every 2 hours while awake. CT angiography has been ordered  to further evaluate for possible PE versus pneumonia not visualized on chest x-ray.  If PE is identified will change to therapeutic anticoagulation.  If pneumonia identified will start antibiotic therapy     Chronic hyponatremia Patient with chronic mild hyponatremia.  Monitor electrolytes and recheck electrolytes renal function in morning    Tobacco use Nicotine patch provided    Leukocytosis Patient with leukocytosis.  Chest x-ray did not reveal acute infiltrate or consolidation.     DVT prophylaxis: Padua score elevated.  Lovenox for DVT prophylaxis Code Status:   Full code Family Communication:  Diagnosis and plan discussed with patient.  Patient verbalized understanding agrees with plan.  No family at bedside.  Further recommendations to follow as clinically indicated Disposition Plan:   Patient is from:  Home  Anticipated DC to:  Home  Anticipated DC date:  Anticipate greater than 2 midnight stay in the hospital to treat acute medical condition  Anticipated DC barriers: No barriers to discharge identified at this time   Admission status:  Inpatient  Severity of Illness: The appropriate patient status for this patient is INPATIENT. Inpatient status is judged to be reasonable and necessary in order to provide the required intensity of service to ensure the patient's safety. The patient's presenting symptoms, physical exam findings, and initial radiographic and laboratory data in the context of their chronic comorbidities is felt to place them at high risk for further clinical  deterioration. Furthermore, it is not anticipated that the patient will be medically stable for discharge from the hospital within 2 midnights of admission. The following factors support the patient status of inpatient.    * I certify that at the point of admission it is my clinical judgment that the patient will require inpatient hospital care spanning beyond 2 midnights from the point of admission due to high intensity of service, high risk for further deterioration and high frequency of surveillance required.Felicia Aline Anes Rigel MD [facility]  How to contact the [facility] Attending or Consulting provider [place] or covering provider during after hours [place], for this patient?   1. Check the care team in [facility] and look for a) attending/consulting [facility] provider listed and b) the [facility] team listed 2. Log into [email or web address] and use [place]'s universal password to access. If you do not have the password, please contact the hospital operator. 3. Locate the [facility] [place] provider you are looking for under [facility] and page to a number that you can be directly reached. 4. If you still have difficulty reaching the provider, please page the [facility] (Director on Call) for the Hospitalists listed on amion for assistance.  06/26/2020, 11:33 PM

## 2020-06-26 NOTE — ED Triage Notes (Signed)
Patient coming [facility] for SOB, diarrhea, and emesis. Patient has prior hx of diarrhea for several months; given medication which resolved the issue - medication recently discontinued.

## 2020-06-26 NOTE — ED Notes (Signed)
ED Provider at bedside. 

## 2020-06-27 DIAGNOSIS — J189 Pneumonia, unspecified organism: Secondary | ICD-10-CM | POA: Diagnosis present

## 2020-06-27 LAB — URINALYSIS, COMPLETE (UACMP) WITH MICROSCOPIC
Bacteria, UA: NONE SEEN
Bilirubin Urine: NEGATIVE
Glucose, UA: NEGATIVE mg/dL
Hgb urine dipstick: NEGATIVE
Ketones, ur: NEGATIVE mg/dL
Leukocytes,Ua: NEGATIVE
Nitrite: NEGATIVE
Protein, ur: NEGATIVE mg/dL
Specific Gravity, Urine: 1.009 (ref 1.005–1.030)
Squamous Epithelial / HPF: NONE SEEN (ref 0–5)
pH: 6 (ref 5.0–8.0)

## 2020-06-27 LAB — BASIC METABOLIC PANEL
Anion gap: 10 (ref 5–15)
BUN: 11 mg/dL (ref 8–23)
CO2: 26 mmol/L (ref 22–32)
Calcium: 9.2 mg/dL (ref 8.9–10.3)
Chloride: 95 mmol/L — ABNORMAL LOW (ref 98–111)
Creatinine, Ser: 1.1 mg/dL — ABNORMAL HIGH (ref 0.44–1.00)
GFR calc Af Amer: 55 mL/min — ABNORMAL LOW (ref >60)
GFR calc non Af Amer: 47 mL/min — ABNORMAL LOW (ref >60)
Glucose, Bld: 95 mg/dL (ref 70–99)
Potassium: 4 mmol/L (ref 3.5–5.1)
Sodium: 131 mmol/L — ABNORMAL LOW (ref 135–145)

## 2020-06-27 LAB — CBC
HCT: 37.3 % (ref 36.0–46.0)
Hemoglobin: 13.4 g/dL (ref 12.0–15.0)
MCH: 33.2 pg (ref 26.0–34.0)
MCHC: 35.9 g/dL (ref 30.0–36.0)
MCV: 92.3 fL (ref 80.0–100.0)
Platelets: 191 10*3/uL (ref 150–400)
RBC: 4.04 MIL/uL (ref 3.87–5.11)
RDW: 13.8 % (ref 11.5–15.5)
WBC: 18.4 10*3/uL — ABNORMAL HIGH (ref 4.0–10.5)
nRBC: 0 % (ref 0.0–0.2)

## 2020-06-27 LAB — TROPONIN I (HIGH SENSITIVITY)
Troponin I (High Sensitivity): 11 ng/L (ref <18)
Troponin I (High Sensitivity): 13 ng/L (ref <18)

## 2020-06-27 LAB — PROCALCITONIN: Procalcitonin: 0.34 ng/mL

## 2020-06-27 MED ORDER — AMLODIPINE BESYLATE 10 MG PO TABS
10.0000 mg | ORAL_TABLET | Freq: Every day | ORAL | Status: DC
  Administered 2020-06-28: 10 mg via ORAL
  Filled 2020-06-27: qty 1

## 2020-06-27 MED ORDER — ONDANSETRON HCL 4 MG/2ML IJ SOLN: 4.0000 mg | Freq: Four times a day (QID) | INTRAMUSCULAR | Status: DC | PRN

## 2020-06-27 MED ORDER — CALCIUM CARBONATE 1250 (500 CA) MG PO TABS
1.0000 | ORAL_TABLET | Freq: Every day | ORAL | Status: DC
  Administered 2020-06-27: 500 mg via ORAL
  Filled 2020-06-27 (×3): qty 1

## 2020-06-27 MED ORDER — ACETAMINOPHEN 325 MG PO TABS: 650.0000 mg | ORAL_TABLET | ORAL | Status: DC | PRN

## 2020-06-27 MED ORDER — THIAMINE HCL 100 MG/ML IJ SOLN: 100.0000 mg | Freq: Every day | INTRAMUSCULAR | Status: DC

## 2020-06-27 MED ORDER — SODIUM CHLORIDE 0.9 % IV SOLN
250.0000 mL | INTRAVENOUS | Status: DC | PRN
  Administered 2020-06-28: 250 mL via INTRAVENOUS

## 2020-06-27 MED ORDER — LISINOPRIL 5 MG PO TABS: 5.0000 mg | ORAL_TABLET | Freq: Every day | ORAL | Status: DC

## 2020-06-27 MED ORDER — LORAZEPAM 2 MG/ML IJ SOLN: 1.0000 mg | INTRAMUSCULAR | Status: DC | PRN

## 2020-06-27 MED ORDER — PNEUMOCOCCAL VAC POLYVALENT 25 MCG/0.5ML IJ INJ
0.5000 mL | INJECTION | INTRAMUSCULAR | Status: AC
  Administered 2020-06-28: 0.5 mL via INTRAMUSCULAR
  Filled 2020-06-27: qty 0.5

## 2020-06-27 MED ORDER — SODIUM CHLORIDE 0.9% FLUSH: 3.0000 mL | INTRAVENOUS | Status: DC | PRN

## 2020-06-27 MED ORDER — ASPIRIN EC 81 MG PO TBEC
81.0000 mg | DELAYED_RELEASE_TABLET | Freq: Every day | ORAL | Status: DC
  Administered 2020-06-27 – 2020-06-28 (×2): 81 mg via ORAL
  Filled 2020-06-27 (×3): qty 1

## 2020-06-27 MED ORDER — FUROSEMIDE 10 MG/ML IJ SOLN: 40.0000 mg | Freq: Two times a day (BID) | INTRAMUSCULAR | Status: DC

## 2020-06-27 MED ORDER — ADULT MULTIVITAMIN W/MINERALS CH
1.0000 | ORAL_TABLET | Freq: Every day | ORAL | Status: DC
  Administered 2020-06-28: 1 via ORAL
  Filled 2020-06-27: qty 1

## 2020-06-27 MED ORDER — SODIUM CHLORIDE 0.9% FLUSH
3.0000 mL | Freq: Two times a day (BID) | INTRAVENOUS | Status: DC
  Administered 2020-06-27 (×2): 3 mL via INTRAVENOUS

## 2020-06-27 MED ORDER — SODIUM CHLORIDE 0.9 % IV SOLN
500.0000 mg | INTRAVENOUS | Status: DC
  Administered 2020-06-27 – 2020-06-28 (×2): 500 mg via INTRAVENOUS
  Filled 2020-06-27 (×3): qty 500

## 2020-06-27 MED ORDER — THIAMINE HCL 100 MG PO TABS
100.0000 mg | ORAL_TABLET | Freq: Every day | ORAL | Status: DC
  Administered 2020-06-28: 100 mg via ORAL
  Filled 2020-06-27: qty 1

## 2020-06-27 MED ORDER — ADULT MULTIVITAMIN W/MINERALS CH: 1.0000 | ORAL_TABLET | Freq: Every day | ORAL | Status: DC

## 2020-06-27 MED ORDER — FOLIC ACID 1 MG PO TABS
1.0000 mg | ORAL_TABLET | Freq: Every day | ORAL | Status: DC
  Administered 2020-06-28: 1 mg via ORAL
  Filled 2020-06-27: qty 1

## 2020-06-27 MED ORDER — ATORVASTATIN CALCIUM 20 MG PO TABS
40.0000 mg | ORAL_TABLET | Freq: Every day | ORAL | Status: DC
  Administered 2020-06-27 – 2020-06-28 (×2): 40 mg via ORAL
  Filled 2020-06-27 (×2): qty 2

## 2020-06-27 MED ORDER — GABAPENTIN 300 MG PO CAPS
300.0000 mg | ORAL_CAPSULE | Freq: Three times a day (TID) | ORAL | Status: DC
  Administered 2020-06-27 – 2020-06-28 (×3): 300 mg via ORAL
  Filled 2020-06-27 (×3): qty 1

## 2020-06-27 MED ORDER — FOLIC ACID 1 MG PO TABS: 1.0000 mg | ORAL_TABLET | Freq: Every day | ORAL | Status: DC

## 2020-06-27 MED ORDER — SODIUM CHLORIDE 0.9 % IV SOLN
1.0000 g | INTRAVENOUS | Status: DC
  Administered 2020-06-27 – 2020-06-28 (×2): 1 g via INTRAVENOUS
  Filled 2020-06-27: qty 10
  Filled 2020-06-27: qty 1
  Filled 2020-06-27: qty 10

## 2020-06-27 MED ORDER — THIAMINE HCL 100 MG PO TABS: 100.0000 mg | ORAL_TABLET | Freq: Every day | ORAL | Status: DC

## 2020-06-27 MED ORDER — ENOXAPARIN SODIUM 40 MG/0.4ML ~~LOC~~ SOLN
40.0000 mg | SUBCUTANEOUS | Status: DC
  Administered 2020-06-27: 40 mg via SUBCUTANEOUS
  Filled 2020-06-27: qty 0.4

## 2020-06-27 MED ORDER — NICOTINE 14 MG/24HR TD PT24
14.0000 mg | MEDICATED_PATCH | Freq: Every day | TRANSDERMAL | Status: DC
  Administered 2020-06-27 – 2020-06-28 (×2): 14 mg via TRANSDERMAL
  Filled 2020-06-27 (×2): qty 1

## 2020-06-27 MED ORDER — METOPROLOL SUCCINATE ER 25 MG PO TB24
25.0000 mg | ORAL_TABLET | Freq: Every day | ORAL | Status: DC
  Filled 2020-06-27: qty 1

## 2020-06-27 MED ORDER — LORAZEPAM 1 MG PO TABS: 1.0000 mg | ORAL_TABLET | ORAL | Status: DC | PRN

## 2020-06-27 MED ORDER — ALBUTEROL SULFATE (2.5 MG/3ML) 0.083% IN NEBU: 3.0000 mL | INHALATION_SOLUTION | RESPIRATORY_TRACT | Status: DC | PRN

## 2020-06-27 NOTE — ED Notes (Signed)
Patient called out believing she had episode of urinary and fecal incontinence. RN and NT to bedside. Patient checked, patient clean and dry. No urine or stool output at this time.

## 2020-06-27 NOTE — ED Notes (Signed)
Placed pt back on 2LNC d/t SPO2 89%. Pt resting, NAD. SPO2 recovered to 97% on 2LNC once placed.

## 2020-06-27 NOTE — ED Notes (Signed)
Reheated pt's lunch per pt request.

## 2020-06-27 NOTE — ED Notes (Signed)
Dr. Danford at bedside at this time.  °

## 2020-06-27 NOTE — Progress Notes (Signed)
Physical Therapy Evaluation Patient Details Name: Felicia Sellers MRN: [id number] DOB: 09/02/1941 Today's Date: 06/27/2020   History of Present Illness  Per MD note:Felicia Sellers is a 79 y.o. female with medical history significant for COPD, hypertension, CAD, history of lung cancer with portion of right lung removed surgically.  Felicia Sellers reports that this afternoon she generalized chills and felt tired and weak.  She developed shortness of breath which was worsened by exertion.  She states that when she woke up this morning she felt fine but later in the afternoon started to feel rundown and had increased fatigue and shortness of breath with walking across her room.  She took a nap and when she woke up from the nap she had chills and states that she has felt cold.  Shortness of breath worsened and her family encouraged her to come to the hospital for evaluation.  She does not use oxygen at home.   Clinical Impression  Patient agrees to PT evaluation. She lives alone and was ambulating without AD prior to hospitalization. She is not sure if she still has her RW. She is independent with bed mobility and MI for sit to stand transfers with RW. She has normal sitting balance and fair standing balance. She is able to stand without AD but needs RW for ambulation. She was able to take 4 steps fwd and 4 steps bwd, limited due to purewick and IV lines. She has 3/5 strength BLE hip flex and knee extension. She will continue to benefit from skilled PT to improve mobility and strength.     Follow Up Recommendations Home health PT    Equipment Recommendations  Rolling walker with 5" wheels    Recommendations for Other Services       Precautions / Restrictions Precautions Precautions: None Restrictions Weight Bearing Restrictions: No      Mobility  Bed Mobility Overal bed mobility: Modified Independent                Transfers Overall transfer level: Modified independent Equipment used: Rolling  walker (2 wheeled)             General transfer comment: VC for safety  Ambulation/Gait Ambulation/Gait assistance: Modified independent (Device/Increase time) Gait Distance (Feet):  (4 feet fwd/bwd due to many IV/purewick etc.) Assistive device: Rolling walker (2 wheeled) Gait Pattern/deviations: Step-to pattern        Stairs            Wheelchair Mobility    Modified Rankin (Stroke Patients Only)       Balance Overall balance assessment: Needs assistance Sitting-balance support: No upper extremity supported;Feet unsupported Sitting balance-Leahy Scale: Normal     Standing balance support: No upper extremity supported Standing balance-Leahy Scale: Fair Standing balance comment:  (needs BUE support on RW for safety)                             Pertinent Vitals/Pain Pain Assessment: No/denies pain    Home Living Family/patient expects to be discharged to:: Unsure                      Prior Function Level of Independence: Independent with assistive device(s)               Hand Dominance   Dominant Hand: Right    Extremity/Trunk Assessment   Upper Extremity Assessment Upper Extremity Assessment: Overall WFL for tasks assessed    Lower  Extremity Assessment Lower Extremity Assessment: Overall WFL for tasks assessed       Communication   Communication: No difficulties  Cognition Arousal/Alertness: Awake/alert Behavior During Therapy: WFL for tasks assessed/performed Overall Cognitive Status: Within Functional Limits for tasks assessed                                        General Comments      Exercises     Assessment/Plan    PT Assessment Patient needs continued PT services  PT Problem List Decreased strength;Decreased balance;Cardiopulmonary status limiting activity       PT Treatment Interventions Gait training;Therapeutic activities;Therapeutic exercise;Balance training    PT Goals  (Current goals can be found in the Care Plan section)  Acute Rehab PT Goals Patient Stated Goal: to walk PT Goal Formulation: Patient unable to participate in goal setting Time For Goal Achievement: 07/11/20 Potential to Achieve Goals: Good    Frequency Min 2X/week   Barriers to discharge Decreased caregiver support      Co-evaluation               AM-PAC PT "6 Clicks" Mobility  Outcome Measure Help needed turning from your back to your side while in a flat bed without using bedrails?: None Help needed moving from lying on your back to sitting on the side of a flat bed without using bedrails?: None Help needed moving to and from a bed to a chair (including a wheelchair)?: None Help needed standing up from a chair using your arms (e.g., wheelchair or bedside chair)?: A Little Help needed to walk in hospital room?: A Little Help needed climbing 3-5 steps with a railing? : A Lot 6 Click Score: 20    End of Session Equipment Utilized During Treatment: Gait belt;Oxygen Activity Tolerance: Patient tolerated treatment well Patient left: in bed Nurse Communication: Mobility status PT Visit Diagnosis: Unsteadiness on feet (R26.81);Difficulty in walking, not elsewhere classified (R26.2)    Time: 0900-0920 PT Time Calculation (min) (ACUTE ONLY): 20 min   Charges:   PT Evaluation $PT Eval Low Complexity: 1 Low PT Treatments $Therapeutic Activity: 8-22 mins          Arelia Sneddon S, PT DPT 06/27/2020, 10:22 AM

## 2020-06-27 NOTE — Care Management (Signed)
[facility] Care Manager received consult for care management.

## 2020-06-27 NOTE — ED Notes (Signed)
Pt is resting in bed at this time. NAD.

## 2020-06-27 NOTE — ED Notes (Signed)
MD at bedside. 

## 2020-06-27 NOTE — ED Notes (Signed)
Cleaned pt up from bowel movement. Changed pad, diaper, and purewick. Pt oxygen turned off prior to changing and pt remained SPO2 96-98% on RA with NAD.

## 2020-06-27 NOTE — Progress Notes (Signed)
[facility] PROGRESS NOTE    Felicia Sellers  MWU:[id number] DOB: January 11, 1941 DOA: 06/26/2020 PCP: Ricardo Jericho, NP      Brief Narrative:  Felicia Sellers is a 79 y.o. F with COPD, HTN, CAD, and lung CA s/p lopectomy who presented with chills and weakness as well as DOE for 1-2 days.  In the ER, she was tachypneic to 25 bpm and SpO2 89%.   CTA showed no PE but left base opacity.  Started on antibiotics and admitted to hospitalist service.          Assessment & Plan:  Sepsis and acute hypoxic respiratory failure due to left lower lobe pneumonia. Presented with tachypnea to 25, leukocytosis, SPO2 less than 90%, in the setting of pneumonia. -Continue ceftriaxone and azithromycin -Flutter valve and incentive spirometry  COPD No wheezing to suggest bronchospasm  Hypertension Coronary artery disease -Continue amlodipine -Continue aspirin, metoprolol, statin -Hold lisinopril  Chronic diastolic CHF Appears euvolemic, acute CHF ruled out. -Hold Lasix for now  History of lung cancer Tobacco use -Smoking cessation recommended, modalities discussed -Continue nicotine patch  Polyneuropathy -Continue gabapentin  GERD -Hold PPI  Alcohol use disorder Drinks 3 beers per day.  Prior history of a pint daily. -Thiamine and folate -CIWA with on demand lorazepam    Disposition: Status is: Inpatient  Remains inpatient appropriate because:Inpatient level of care appropriate due to severity of illness   Dispo: The patient is from: Home              Anticipated d/c is to: Home              Anticipated d/c date is: 1 day              Patient currently is not medically stable to d/c.    Patient was admitted with a COPD flare pneumonia.  She does not use oxygen at rest, but at present is severely dyspneic with exertion and still requires supplemental oxygen to keep her oxygen level greater than 90%.          MDM: The below labs and imaging  reports were reviewed and summarized above.  Medication management as above.  This is a severe illness with threat to life or bodily function and severe exacerbation of her chronic illness.  DVT prophylaxis: enoxaparin (LOVENOX) injection 40 mg Start: 06/27/20 1000  Code Status: Full code Family Communication: Son by phone    Consultants:     Procedures:   Echocardiogram pending  Antimicrobials:   Ceftriaxone and azithromycin 9/4   Culture data:              Subjective: Patient still very dyspneic, coughing only.  She is short of breath, weak with exertion.  Objective: Vitals:   06/27/20 1200 06/27/20 1343 06/27/20 1400 06/27/20 1430  BP: (!) 115/55 (!) 106/57 104/85 (!) 96/47  Pulse: 63 63 65 68  Resp: (!) 24 (!) 23 (!) 30 (!) 24  Temp:      SpO2: 100% 100% 99% 93%  Weight:      Height:        Intake/Output Summary (Last 24 hours) at 06/27/2020 1724 Last data filed at 06/27/2020 1559 Gross per 24 hour  Intake --  Output 275 ml  Net -275 ml   Filed Weights   06/26/20 2106  Weight: 57.6 kg    Examination: General appearance:  adult female, alert and in no acute distress.   HEENT: Anicteric, conjunctiva pink, lids and lashes  normal. No nasal deformity, discharge, epistaxis.  Lips moist.   Skin: Warm and dry.  No jaundice.  No suspicious rashes or lesions. Cardiac: RRR, nl S1-S2, no murmurs appreciated.  Capillary refill is brisk.  JVP normal.  No LE edema.  Radial pulses 2+ and symmetric. Respiratory: Tachypneic with minimal exertion, wheezing throughout, rales at the bases. Abdomen: Abdomen soft.  No TTP or guarding. No ascites, distension, hepatosplenomegaly.   MSK: No deformities or effusions. Neuro: Awake and alert.  EOMI, moves all extremities. Speech fluent.    Psych: Sensorium intact and responding to questions, attention normal. Affect normal.  Judgment and insight appear normal.    Data Reviewed: I have personally reviewed following labs and  imaging studies:  CBC: Recent Labs  Lab 06/26/20 2058 06/27/20 0032  WBC 17.6* 18.4*  HGB 13.4 13.4  HCT 38.7 37.3  MCV 94.6 92.3  PLT 202 676   Basic Metabolic Panel: Recent Labs  Lab 06/26/20 2058 06/27/20 0052  NA 132* 131*  K 3.9 4.0  CL 97* 95*  CO2 26 26  GLUCOSE 97 95  BUN 10 11  CREATININE 0.95 1.10*  CALCIUM 9.3 9.2   GFR: Estimated Creatinine Clearance: 33.2 mL/min (A) (by C-G formula based on SCr of 1.1 mg/dL (H)). Liver Function Tests: Recent Labs  Lab 06/26/20 2058  AST 17  ALT 9  ALKPHOS 72  BILITOT 1.3*  PROT 6.9  ALBUMIN 3.8   Recent Labs  Lab 06/26/20 2058  LIPASE 20   No results for input(s): AMMONIA in the last 168 hours. Coagulation Profile: No results for input(s): INR, PROTIME in the last 168 hours. Cardiac Enzymes: No results for input(s): CKTOTAL, CKMB, CKMBINDEX, TROPONINI in the last 168 hours. BNP (last 3 results) No results for input(s): PROBNP in the last 8760 hours. HbA1C: No results for input(s): HGBA1C in the last 72 hours. CBG: No results for input(s): GLUCAP in the last 168 hours. Lipid Profile: No results for input(s): CHOL, HDL, LDLCALC, TRIG, CHOLHDL, LDLDIRECT in the last 72 hours. Thyroid Function Tests: No results for input(s): TSH, T4TOTAL, FREET4, T3FREE, THYROIDAB in the last 72 hours. Anemia Panel: No results for input(s): VITAMINB12, FOLATE, FERRITIN, TIBC, IRON, RETICCTPCT in the last 72 hours. Urine analysis:    Component Value Date/Time   COLORURINE STRAW (A) 06/26/2020 2109   APPEARANCEUR CLEAR (A) 06/26/2020 2109   APPEARANCEUR Clear 05/28/2014 0855   LABSPEC 1.009 06/26/2020 2109   LABSPEC 1.013 05/28/2014 0855   PHURINE 6.0 06/26/2020 2109   GLUCOSEU NEGATIVE 06/26/2020 2109   GLUCOSEU Negative 05/28/2014 0855   HGBUR NEGATIVE 06/26/2020 2109   BILIRUBINUR NEGATIVE 06/26/2020 2109   BILIRUBINUR Negative 05/28/2014 0855   KETONESUR NEGATIVE 06/26/2020 2109   PROTEINUR NEGATIVE 06/26/2020  2109   NITRITE NEGATIVE 06/26/2020 2109   LEUKOCYTESUR NEGATIVE 06/26/2020 2109   LEUKOCYTESUR Negative 05/28/2014 0855   Sepsis Labs: @LABRCNTIP (procalcitonin:4,lacticacidven:4)  ) Recent Results (from the past 240 hour(s))  SARS Coronavirus 2 by RT PCR (hospital order, performed in [place] hospital lab) Nasopharyngeal Nasopharyngeal Swab     Status: None   Collection Time: 06/26/20  9:37 PM   Specimen: Nasopharyngeal Swab  Result Value Ref Range Status   SARS Coronavirus 2 NEGATIVE NEGATIVE Final    Comment: (NOTE) SARS-CoV-2 target nucleic acids are NOT DETECTED.  The SARS-CoV-2 RNA is generally detectable in upper and lower respiratory specimens during the acute phase of infection. The lowest concentration of SARS-CoV-2 viral copies this assay can detect is 250  copies / mL. A negative result does not preclude SARS-CoV-2 infection and should not be used as the sole basis for treatment or other patient management decisions.  A negative result may occur with improper specimen collection / handling, submission of specimen other than nasopharyngeal swab, presence of viral mutation(s) within the areas targeted by this assay, and inadequate number of viral copies (<250 copies / mL). A negative result must be combined with clinical observations, patient history, and epidemiological information.  Fact Sheet for Patients:   StrictlyIdeas.no  Fact Sheet for Healthcare Providers: BankingDealers.co.za  This test is not yet approved or  cleared by the [place] FDA and has been authorized for detection and/or diagnosis of SARS-CoV-2 by FDA under an Emergency Use Authorization (EUA).  This EUA will remain in effect (meaning this test can be used) for the duration of the COVID-19 declaration under Section 564(b)(1) of the Act, 21 U.S.C. section 360bbb-3(b)(1), unless the authorization is terminated or revoked sooner.  Performed at  [facility], [address] [place], [place] [address]          Radiology Studies: CT Angio Chest PE W and/or Wo Contrast  Result Date: 06/27/2020 CLINICAL DATA:  Hypoxia. EXAM: CT ANGIOGRAPHY CHEST WITH CONTRAST TECHNIQUE: Multidetector CT imaging of the chest was performed using the standard protocol during bolus administration of intravenous contrast. Multiplanar CT image reconstructions and MIPs were obtained to evaluate the vascular anatomy. CONTRAST:  38mL OMNIPAQUE IOHEXOL 350 MG/ML SOLN COMPARISON:  09/17/2018 FINDINGS: Cardiovascular: Cardiomegaly. Diffuse coronary artery calcifications. No filling defects in the pulmonary arteries to suggest pulmonary emboli. Heavily calcified aorta. 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 This previously measured 4.8 cm. Mediastinum/Nodes: No mediastinal, hilar, or axillary adenopathy. Lungs/Pleura: Emphysema. Airspace disease within the right lower lobe concerning for pneumonia. No effusions. Upper Abdomen: Imaging into the upper abdomen demonstrates no acute findings. Musculoskeletal: Chest wall soft tissues are unremarkable. No acute bony abnormality. Review of the MIP images confirms the above findings. IMPRESSION: No evidence of pulmonary embolus. 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 Patchy airspace disease within the right lower lobe concerning for pneumonia. Recommend follow-up after treatment to ensure resolution. Cardiomegaly, coronary artery disease. Aortic Atherosclerosis (ICD10-I70.0) and Emphysema (ICD10-J43.9). Electronically Signed   By: Rolm Baptise M.D.   On: 06/27/2020 00:11   DG Chest Portable 1 View  Result Date: 06/26/2020 CLINICAL DATA:  Shortness of breath EXAM: PORTABLE CHEST 1 VIEW COMPARISON:  None. FINDINGS: The heart size and mediastinal contours are mildly enlarged. Aortic knob calcifications are seen. There is prominence of the central pulmonary vasculature. No  large airspace consolidation is seen. No acute osseous abnormality. IMPRESSION: Cardiomegaly and pulmonary vascular congestion. Electronically Signed   By: Prudencio Pair M.D.   On: 06/26/2020 21:41        Scheduled Meds: . amLODipine  10 mg Oral Daily  . aspirin EC  81 mg Oral Daily  . atorvastatin  40 mg Oral Daily  . calcium carbonate  1 tablet Oral Q breakfast  . enoxaparin (LOVENOX) injection  40 mg Subcutaneous Q24H  . gabapentin  300 mg Oral TID  . metoprolol succinate  25 mg Oral Daily  . nicotine  14 mg Transdermal Daily  . sodium chloride flush  3 mL Intravenous Q12H   Continuous Infusions: . sodium chloride    . azithromycin Stopped (06/27/20 0310)  . cefTRIAXone (ROCEPHIN)  IV Stopped (06/27/20 0121)     LOS:  1 day    Time spent: 25 minutes    Edwin Dada, MD [facility] 06/27/2020, 5:24 PM     Please page though [place] or Epic secure chat:  For [employer], Adult nurse

## 2020-06-27 NOTE — ED Notes (Signed)
Pt provided with meal tray at this time 

## 2020-06-28 ENCOUNTER — Inpatient Hospital Stay
Admit: 2020-06-28 | Discharge: 2020-06-28 | Disposition: A | Discharge status: Home / Self Care | Payer: Medicare Other | Attending: Family Medicine | Admitting: Family Medicine

## 2020-06-28 LAB — BASIC METABOLIC PANEL
Anion gap: 9 (ref 5–15)
BUN: 14 mg/dL (ref 8–23)
CO2: 26 mmol/L (ref 22–32)
Calcium: 8.2 mg/dL — ABNORMAL LOW (ref 8.9–10.3)
Chloride: 94 mmol/L — ABNORMAL LOW (ref 98–111)
Creatinine, Ser: 0.72 mg/dL (ref 0.44–1.00)
GFR calc Af Amer: >60 mL/min (ref >60)
GFR calc non Af Amer: >60 mL/min (ref >60)
Glucose, Bld: 103 mg/dL — ABNORMAL HIGH (ref 70–99)
Potassium: 3.1 mmol/L — ABNORMAL LOW (ref 3.5–5.1)
Sodium: 129 mmol/L — ABNORMAL LOW (ref 135–145)

## 2020-06-28 LAB — PROCALCITONIN: Procalcitonin: 0.29 ng/mL

## 2020-06-28 MED ORDER — PREDNISONE 20 MG PO TABS: 40.0000 mg | ORAL_TABLET | Freq: Every day | ORAL | 0 refills | Status: DC

## 2020-06-28 MED ORDER — CEFDINIR 300 MG PO CAPS: 300.0000 mg | ORAL_CAPSULE | Freq: Two times a day (BID) | ORAL | 0 refills | Status: DC

## 2020-06-28 MED ORDER — ALBUTEROL SULFATE 108 (90 BASE) MCG/ACT IN AEPB: 2.0000 | INHALATION_SPRAY | Freq: Four times a day (QID) | RESPIRATORY_TRACT | 11 refills | Status: AC | PRN

## 2020-06-28 MED ORDER — POTASSIUM CHLORIDE CRYS ER 20 MEQ PO TBCR
40.0000 meq | EXTENDED_RELEASE_TABLET | Freq: Once | ORAL | Status: AC
  Administered 2020-06-28: 40 meq via ORAL
  Filled 2020-06-28: qty 2

## 2020-06-28 MED ORDER — AZITHROMYCIN 250 MG PO TABS: 250.0000 mg | ORAL_TABLET | Freq: Every day | ORAL | 0 refills | Status: AC

## 2020-06-28 NOTE — Progress Notes (Signed)
SATURATION QUALIFICATIONS: (This note is used to comply with regulatory documentation for home oxygen)  Patient Saturations on Room Air at Rest = 93  Patient Saturations on Room Air while Ambulating = 88 %  Patient Saturations on 2L Liters of oxygen while Ambulating = 92 %  Please briefly explain why patient needs home oxygen:

## 2020-06-28 NOTE — Plan of Care (Signed)
Discharge order received. Patient mental status is at baseline. Vital signs stable . No signs of acute distress. Discharge instructions given. Patient verbalized understanding. No other issues noted at this time. Pt discharged with portable O2. Nephew aware and instructed on Oxygen care.

## 2020-06-28 NOTE — Discharge Summary (Signed)
Physician Discharge Summary  Felicia Sellers CHY:[id number] DOB: Mar 23, 1941 DOA: 06/26/2020  PCP: Ricardo Jericho, NP  Admit date: 06/26/2020 Discharge date: 06/28/2020  Admitted From: Home  Disposition:  Home with [facility]   Recommendations for Outpatient Follow-up:  1. Follow up with PCP Eulogio Bear in 1-2 weeks 2. Felicia Sellers: Please wean from O2, confirm smoking cessation, refer to Pulm if not established and if patient and you agree     Home Health: PT and OT due to ongoing SOB with exertion  Equipment/Devices: Home O2  Discharge Condition: Fair  CODE STATUS: FULL Diet recommendation: Regular  Brief/Interim Summary: Felicia Sellers is a 79 y.o. F with COPD, HTN, CAD, and lung CA s/p lopectomy who presented with chills and weakness as well as DOE for 1-2 days.  In the ER, she was tachypneic to 25 bpm and SpO2 89%.   CTA showed no PE but left base opacity.  Started on antibiotics and admitted to hospitalist service.     PRINCIPAL HOSPITAL DIAGNOSIS: Sepsis and acute hypoxic respiratory failure due to left lower lobe pneumonia    Discharge Diagnoses:   Sepsis and acute hypoxic respiratory failure due to left lower lobe pneumonia. Presented with tachypnea to 25, leukocytosis, SPO2 less than 90%, in the setting of pneumonia.  Started on empiric antibiotics, fever curve normalized < 100 F, heart rate < 100bpm, RR < 24, mentating well, symptoms improving.   Stable for discharge.    Discharged to complete 7 days total antibiotics.   COPD Severe wheezing, patient started on steroids, discharged with 5 days prednisone burst.  Patient desaturated to 88% on room air with ambulation, discharged with O2.  Perhaps will be able to wean with PCP, may need lifelong O2 due to advanced COPD.    Hypertension Coronary artery disease  Chronic diastolic CHF Acute CHF ruled out.  History of lung cancer Tobacco use Smoking cessation recommended, modalities discussed  Alcohol  use disorder Drinks 3 beers per day.  Prior history of a pint daily.            Discharge Instructions  Discharge Instructions    Diet - low sodium heart healthy   Complete by: As directed    Discharge instructions   Complete by: As directed    From Dr. Loleta Books: You were admitted for COPD flare and pneumonia.   For the pneumonia: Take the rest of the course of antibiotics, cefdinir and azithromycin Take azithromycin 250 mg once daily in the morning for 3 more days Take cefdinir 300 mg twice daily starting tonight Monday   For the COPD flare: Finish the course of steroids, take prednisone 40 mg (2 tabs) once daily in the morning for 3 more days Use your normal home inhalers  For the next 7 days, take albuterol inhaler 3 times per day, then after a week, you can reduce to as needed use  Use the oxygen all the time until you see your primary care doctor. The purpose of the  oxygen is to keep your oxygen level at 88% or above Use a pulse oximeter to measure your oxygen level.  If the home health agency doesn't bring you one of these, you can find them at any drug store.   Go see your primary care doctor in 1 week   Increase activity slowly   Complete by: As directed      Allergies as of 06/28/2020   No Known Allergies     Medication List    STOP  taking these medications   lansoprazole 30 MG capsule Commonly known as: PREVACID   pantoprazole 40 MG tablet Commonly known as: PROTONIX     TAKE these medications   Albuterol Sulfate 108 (90 Base) MCG/ACT Aepb Commonly known as: PROAIR RESPICLICK Inhale 2 puffs into the lungs every 6 (six) hours as needed.   alendronate 70 MG tablet Commonly known as: FOSAMAX Take 70 mg by mouth once a week. Take with a full glass of water on an empty stomach.   amLODipine 10 MG tablet Commonly known as: NORVASC Take 10 mg by mouth daily.   atorvastatin 40 MG tablet Commonly known as: LIPITOR Take 40 mg by mouth daily.    azithromycin 250 MG tablet Commonly known as: Zithromax Z-Pak Take 1 tablet (250 mg total) by mouth daily for 3 days. Take 2 tablets (500 mg) on  Day 1,  followed by 1 tablet (250 mg) once daily on Days 2 through 5.   calcium carbonate 1250 (500 Ca) MG tablet Commonly known as: OS-CAL - dosed in mg of elemental calcium Take 1 tablet by mouth 2 (two) times daily with a meal.   cefdinir 300 MG capsule Commonly known as: OMNICEF Take 1 capsule (300 mg total) by mouth 2 (two) times daily.   Cholecalciferol 25 MCG (1000 UT) capsule Take 1,000 Units by mouth daily.   docusate sodium 100 MG capsule Commonly known as: COLACE Take 100 mg by mouth daily.   gabapentin 300 MG capsule Commonly known as: NEURONTIN Take 300 mg by mouth 3 (three) times daily.   lisinopril 10 MG tablet Commonly known as: ZESTRIL Take 10 mg by mouth daily.   loperamide 2 MG capsule Commonly known as: IMODIUM Take 4 mg by mouth as needed for diarrhea or loose stools.   meloxicam 7.5 MG tablet Commonly known as: MOBIC meloxicam 7.5 mg tablet  TK 1 T PO  D PRN   metoprolol succinate 25 MG 24 hr tablet Commonly known as: TOPROL-XL Take 25 mg by mouth daily.   predniSONE 20 MG tablet Commonly known as: DELTASONE Take 2 tablets (40 mg total) by mouth daily with breakfast.   RABEprazole 20 MG tablet Commonly known as: ACIPHEX Take 20 mg by mouth daily.            Durable Medical Equipment  (From admission, onward)         Start     Ordered   06/28/20 3149  DME Oxygen  (Discharge Planning)  Once       Question Answer Comment  Length of Need Lifetime   Mode or (Route) Nasal cannula   Liters per Minute 2   Frequency Continuous (stationary and portable oxygen unit needed)   Oxygen conserving device Yes   Oxygen delivery system Gas      06/28/20 0936          Follow-up Information    [facility] Follow up on 07/09/2020.   Specialty:  Cardiology Why: at 9:30am. Enter through the [place] entrance Contact information: [address] [place] [phone]       Ricardo Jericho, NP. Schedule an appointment as soon as possible for a visit in 1 week(s).   Specialty: Family Medicine Contact information: [place] [address] [phone]              No Known Allergies   Procedures/Studies: CT Angio Chest PE W and/or Wo  Contrast  Result Date: 06/27/2020 CLINICAL DATA:  Hypoxia. EXAM: CT ANGIOGRAPHY CHEST WITH CONTRAST TECHNIQUE: Multidetector CT imaging of the chest was performed using the standard protocol during bolus administration of intravenous contrast. Multiplanar CT image reconstructions and MIPs were obtained to evaluate the vascular anatomy. CONTRAST:  67mL OMNIPAQUE IOHEXOL 350 MG/ML SOLN COMPARISON:  09/17/2018 FINDINGS: Cardiovascular: Cardiomegaly. Diffuse coronary artery calcifications. No filling defects in the pulmonary arteries to suggest pulmonary emboli. Heavily calcified aorta. 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 This previously measured 4.8 cm. Mediastinum/Nodes: No mediastinal, hilar, or axillary adenopathy. Lungs/Pleura: Emphysema. Airspace disease within the right lower lobe concerning for pneumonia. No effusions. Upper Abdomen: Imaging into the upper abdomen demonstrates no acute findings. Musculoskeletal: Chest wall soft tissues are unremarkable. No acute bony abnormality. Review of the MIP images confirms the above findings. IMPRESSION: No evidence of pulmonary embolus. 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 Patchy airspace disease within the right lower lobe concerning for pneumonia. Recommend follow-up after treatment to ensure resolution. Cardiomegaly, coronary artery disease. Aortic Atherosclerosis (ICD10-I70.0) and Emphysema (ICD10-J43.9). Electronically Signed   By:  Rolm Baptise M.D.   On: 06/27/2020 00:11   DG Chest Portable 1 View  Result Date: 06/26/2020 CLINICAL DATA:  Shortness of breath EXAM: PORTABLE CHEST 1 VIEW COMPARISON:  None. FINDINGS: The heart size and mediastinal contours are mildly enlarged. Aortic knob calcifications are seen. There is prominence of the central pulmonary vasculature. No large airspace consolidation is seen. No acute osseous abnormality. IMPRESSION: Cardiomegaly and pulmonary vascular congestion. Electronically Signed   By: Prudencio Pair M.D.   On: 06/26/2020 21:41       Subjective: Feeling better.  Appetite good, slightly dyspneic with exertion, but close to baseline.  No fever, confusion, chest pain, vomiting, diarrhea.  Discharge Exam: Vitals:   06/27/20 2140 06/28/20 0422  BP: 122/61 112/61  Pulse: 77 79  Resp: 20 16  Temp: 98.1 F (36.7 C) 99.3 F (37.4 C)  SpO2: 99% 96%   Vitals:   06/27/20 1430 06/27/20 2140 06/28/20 0422 06/28/20 0640  BP: (!) 96/47 122/61 112/61   Pulse: 68 77 79   Resp: (!) 24 20 16    Temp:  98.1 F (36.7 C) 99.3 F (37.4 C)   TempSrc:  Oral Oral   SpO2: 93% 99% 96%   Weight:  46.3 kg  49.7 kg  Height:  5\' 3"  (1.6 m)      General: Pt is alert, awake, not in acute distress Cardiovascular: RRR, nl S1-S2, no murmurs appreciated.   No LE edema.   Respiratory: Normal respiratory rate and rhythm.  Wheezing bilaterally. Abdominal: Abdomen soft and non-tender.  No distension or HSM.   Neuro/Psych: Strength symmetric in upper and lower extremities.  Judgment and insight appear normal.   The results of significant diagnostics from this hospitalization (including imaging, microbiology, ancillary and laboratory) are listed below for reference.     Microbiology: Recent Results (from the past 240 hour(s))  SARS Coronavirus 2 by RT PCR (hospital order, performed in [facility] hospital lab) Nasopharyngeal Nasopharyngeal Swab     Status: None   Collection Time: 06/26/20  9:37 PM    Specimen: Nasopharyngeal Swab  Result Value Ref Range Status   SARS Coronavirus 2 NEGATIVE NEGATIVE Final    Comment: (NOTE) SARS-CoV-2 target nucleic acids are NOT DETECTED.  The SARS-CoV-2 RNA is generally detectable in upper and lower respiratory specimens during the acute phase of infection. The  lowest concentration of SARS-CoV-2 viral copies this assay can detect is 250 copies / mL. A negative result does not preclude SARS-CoV-2 infection and should not be used as the sole basis for treatment or other patient management decisions.  A negative result may occur with improper specimen collection / handling, submission of specimen other than nasopharyngeal swab, presence of viral mutation(s) within the areas targeted by this assay, and inadequate number of viral copies (<250 copies / mL). A negative result must be combined with clinical observations, patient history, and epidemiological information.  Fact Sheet for Patients:   StrictlyIdeas.no  Fact Sheet for Healthcare Providers: BankingDealers.co.za  This test is not yet approved or  cleared by the [place] FDA and has been authorized for detection and/or diagnosis of SARS-CoV-2 by FDA under an Emergency Use Authorization (EUA).  This EUA will remain in effect (meaning this test can be used) for the duration of the COVID-19 declaration under Section 564(b)(1) of the Act, 21 U.S.C. section 360bbb-3(b)(1), unless the authorization is terminated or revoked sooner.  Performed at [facility], [address] [place], [place] [address]      Labs: BNP (last 3 results) Recent Labs    06/26/20 2058  BNP 062.3*   Basic Metabolic Panel: Recent Labs  Lab 06/26/20 2058 06/27/20 0052 06/28/20 0456  NA 132* 131* 129*  K 3.9 4.0 3.1*  CL 97* 95* 94*  CO2 26 26 26   GLUCOSE 97 95 103*  BUN 10 11 14   CREATININE 0.95 1.10* 0.72  CALCIUM 9.3 9.2 8.2*   Liver  Function Tests: Recent Labs  Lab 06/26/20 2058  AST 17  ALT 9  ALKPHOS 72  BILITOT 1.3*  PROT 6.9  ALBUMIN 3.8   Recent Labs  Lab 06/26/20 2058  LIPASE 20   No results for input(s): AMMONIA in the last 168 hours. CBC: Recent Labs  Lab 06/26/20 2058 06/27/20 0032  WBC 17.6* 18.4*  HGB 13.4 13.4  HCT 38.7 37.3  MCV 94.6 92.3  PLT 202 191   Cardiac Enzymes: No results for input(s): CKTOTAL, CKMB, CKMBINDEX, TROPONINI in the last 168 hours. BNP: Invalid input(s): POCBNP CBG: No results for input(s): GLUCAP in the last 168 hours. D-Dimer No results for input(s): DDIMER in the last 72 hours. Hgb A1c No results for input(s): HGBA1C in the last 72 hours. Lipid Profile No results for input(s): CHOL, HDL, LDLCALC, TRIG, CHOLHDL, LDLDIRECT in the last 72 hours. Thyroid function studies No results for input(s): TSH, T4TOTAL, T3FREE, THYROIDAB in the last 72 hours.  Invalid input(s): FREET3 Anemia work up No results for input(s): VITAMINB12, FOLATE, FERRITIN, TIBC, IRON, RETICCTPCT in the last 72 hours. Urinalysis    Component Value Date/Time   COLORURINE STRAW (A) 06/26/2020 2109   APPEARANCEUR CLEAR (A) 06/26/2020 2109   APPEARANCEUR Clear 05/28/2014 0855   LABSPEC 1.009 06/26/2020 2109   LABSPEC 1.013 05/28/2014 0855   PHURINE 6.0 06/26/2020 2109   GLUCOSEU NEGATIVE 06/26/2020 2109   GLUCOSEU Negative 05/28/2014 0855   HGBUR NEGATIVE 06/26/2020 2109   BILIRUBINUR NEGATIVE 06/26/2020 2109   BILIRUBINUR Negative 05/28/2014 0855   KETONESUR NEGATIVE 06/26/2020 2109   PROTEINUR NEGATIVE 06/26/2020 2109   NITRITE NEGATIVE 06/26/2020 2109   LEUKOCYTESUR NEGATIVE 06/26/2020 2109   LEUKOCYTESUR Negative 05/28/2014 0855   Sepsis Labs Invalid input(s): PROCALCITONIN,  WBC,  LACTICIDVEN Microbiology Recent Results (from the past 240 hour(s))  SARS Coronavirus 2 by RT PCR (hospital order, performed in [facility] hospital lab) Nasopharyngeal Nasopharyngeal  Swab      Status: None   Collection Time: 06/26/20  9:37 PM   Specimen: Nasopharyngeal Swab  Result Value Ref Range Status   SARS Coronavirus 2 NEGATIVE NEGATIVE Final    Comment: (NOTE) SARS-CoV-2 target nucleic acids are NOT DETECTED.  The SARS-CoV-2 RNA is generally detectable in upper and lower respiratory specimens during the acute phase of infection. The lowest concentration of SARS-CoV-2 viral copies this assay can detect is 250 copies / mL. A negative result does not preclude SARS-CoV-2 infection and should not be used as the sole basis for treatment or other patient management decisions.  A negative result may occur with improper specimen collection / handling, submission of specimen other than nasopharyngeal swab, presence of viral mutation(s) within the areas targeted by this assay, and inadequate number of viral copies (<250 copies / mL). A negative result must be combined with clinical observations, patient history, and epidemiological information.  Fact Sheet for Patients:   StrictlyIdeas.no  Fact Sheet for Healthcare Providers: BankingDealers.co.za  This test is not yet approved or  cleared by the [place] FDA and has been authorized for detection and/or diagnosis of SARS-CoV-2 by FDA under an Emergency Use Authorization (EUA).  This EUA will remain in effect (meaning this test can be used) for the duration of the COVID-19 declaration under Section 564(b)(1) of the Act, 21 U.S.C. section 360bbb-3(b)(1), unless the authorization is terminated or revoked sooner.  Performed at [facility], [address] [place], [place] [address]      Time coordinating discharge: 35 minutes The [place] controlled substances registry was reviewed for this patient       SIGNED:   Edwin Dada, MD  [facility] 06/28/2020, 10:49 AM

## 2020-06-28 NOTE — TOC Initial Note (Signed)
Transition of Care [facility]) - Initial/Assessment Note    Patient Details  Name: Felicia Sellers MRN: [id number] Date of Birth: 1941-04-07  [facility] of [name]) CM/SW Contact:    Candie Chroman, LCSW Phone Number: 06/28/2020, 9:57 AM  Clinical Narrative: CSW met with patient. No supports at bedside. CSW introduced role and explained that PT recommendations would be discussed. Patient declining home health at this time. She will notify her PCP if she changes her mind. Patient is pretty sure she has a walker at home as well. Patient was not on oxygen prior to admission. Nurse and NT will complete walking saturations test to determine if patient qualifies for home oxygen. No further concerns. CSW encouraged patient to contact CSW as needed. CSW will continue to follow patient for support and facilitate return home today. Patient stated her nephew will take her home.                 Expected Discharge Plan: Home/Self Care Barriers to Discharge: No Barriers Identified   Patient Goals and CMS Choice     Choice offered to / list presented to : NA  Expected Discharge Plan and Services Expected Discharge Plan: Home/Self Care       Living arrangements for the past 2 months: Single Family Home Expected Discharge Date: 06/28/20                                    Prior Living Arrangements/Services Living arrangements for the past 2 months: Single Family Home Lives with:: Relatives (Nephew) Patient language and need for interpreter reviewed:: Yes Do you feel safe going back to the place where you live?: Yes      Need for Family Participation in Patient Care: Yes (Comment) Care giver support system in place?: Yes (comment) Current home services: DME Criminal Activity/Legal Involvement Pertinent to Current Situation/Hospitalization: No - Comment as needed  Activities of Daily Living Home Assistive Devices/Equipment: Dentures (specify type) ADL Screening (condition at time of  admission) Patient's cognitive ability adequate to safely complete daily activities?: Yes Is the patient deaf or have difficulty hearing?: No Does the patient have difficulty seeing, even when wearing glasses/contacts?: No Does the patient have difficulty concentrating, remembering, or making decisions?: No Patient able to express need for assistance with ADLs?: Yes Does the patient have difficulty dressing or bathing?: No Independently performs ADLs?: Yes (appropriate for developmental age) Does the patient have difficulty walking or climbing stairs?: No Weakness of Legs: Both Weakness of Arms/Hands: None  Permission Sought/Granted                  Emotional Assessment Appearance:: Appears stated age Attitude/Demeanor/Rapport: Engaged Affect (typically observed): Appropriate Orientation: : Oriented to Self, Oriented to Place, Oriented to  Time, Oriented to Situation Alcohol / Substance Use: Not Applicable Psych Involvement: No (comment)  Admission diagnosis:  Community acquired pneumonia [J18.9] Acute CHF (congestive heart failure) ([place]) [I50.9] Acute respiratory failure with hypoxia ([place]) [J96.01] Patient Active Problem List   Diagnosis Date Noted  . Community acquired pneumonia 06/27/2020  . Acute CHF (congestive heart failure) ([place]) 06/26/2020  . Leukocytosis 06/26/2020  . Hypoxia 06/26/2020  . Thoracic aortic aneurysm without rupture ([place]) 08/16/2018  . Kyphosis 08/07/2018  . Hypertension 08/07/2018  . Tobacco use 08/07/2018  . Age-related osteoporosis without current pathological fracture 12/21/2016  . Chronic hyponatremia 11/04/2015  . Chronic low back pain 06/18/2015  . Cardiomyopathy ([place])  08/13/2014  . Left bundle branch block (LBBB) 07/23/2014  . Pedal edema 07/23/2014  . SOB (shortness of breath) on exertion 07/23/2014   PCP:  Ricardo Jericho, NP Pharmacy:   [facility] [place], [place] [facility] [address] AT [place] [facility] [place] [address] Phone: [phone] Fax: [phone]     Social Determinants of Health (SDOH) Interventions    Readmission Risk Interventions No flowsheet data found.

## 2020-06-28 NOTE — Progress Notes (Signed)
*  PRELIMINARY RESULTS* Echocardiogram 2D Echocardiogram has been performed.  Felicia Sellers 06/28/2020, 2:30 PM

## 2020-06-28 NOTE — TOC Transition Note (Addendum)
Transition of Care [facility]) - CM/SW Discharge Note   Patient Details  Name: Felicia Sellers MRN: [id number] Date of Birth: 19-Aug-1941  Transition of Care [facility]) CM/SW Contact:  Candie Chroman, LCSW Phone Number: 06/28/2020, 10:10 AM   Clinical Narrative:  CSW called [employer] representative and ordered home oxygen to be delivered to the room before she leaves. CSW consulted for substance abuse resources. Per chart, patient does not use alcohol or drugs. Patient has orders to discharge home today. No further concerns. CSW signing off.   Final next level of care: Home/Self Care Barriers to Discharge: No Barriers Identified   Patient Goals and CMS Choice     Choice offered to / list presented to : NA  Discharge Placement                Patient to be transferred to facility by: Nephew will take her home.   Patient and family notified of of transfer: 06/28/20  Discharge Plan and Services                DME Arranged: Oxygen DME Agency: [employer] Date DME Agency Contacted: 06/28/20   Representative spoke with at DME Agency: Vikki Ports            Social Determinants of Health ([place]) Interventions     Readmission Risk Interventions No flowsheet data found.

## 2020-06-29 LAB — ECHOCARDIOGRAM COMPLETE
AR max vel: 1.65 cm2
AV Area VTI: 2.1 cm2
AV Area mean vel: 1.76 cm2
AV Mean grad: 6 mmHg
AV Peak grad: 11.3 mmHg
Ao pk vel: 1.68 m/s
Area-P 1/2: 3.37 cm2
Height: 63 in
S' Lateral: 1.88 cm
Weight: 1753.1 oz

## 2020-07-07 ENCOUNTER — Telehealth: Payer: Self-pay | Admitting: Family

## 2020-07-07 NOTE — Progress Notes (Signed)
Patient ID: Felicia Sellers, female    DOB: 09/21/1941, 79 y.o.   MRN: [id number]  HPI  Felicia Sellers is a 79 y/o female with a history of lung cancer, CAD, HTN, COPD, recent tobacco use, current alcohol use and chronic heart failure.  Echo report from 06/28/20 reviewed and showed an EF of 70-75% along with moderate LVH, moderately elevated PA pressure, moderate MR and mild/moderate LAE.   Admitted 06/26/20 due to shortness of breath and fatigue. CTA showed left lower lobe pneumonia. Given antibiotics. Also given prednisone due to COPD exacerbation. Could not be weaned off oxygen. Discharged after 2 days.   She presents today for her initial visit with a chief complaint of minimal fatigue upon moderate exertion. She describes this as chronic in nature having been present for several years. She has associated back pain along with this. She denies any difficulty sleeping, dizziness, abdominal distention, palpitations, pedal edema, chest pain, shortness of breath, cough or change in appetite.  Does not have any scales at home.   Past Medical History:  Diagnosis Date  . Cancer ([place])    right lung removed - lung cancer 1/3 lung removed   . CHF (congestive heart failure) ([place])   . COPD (chronic obstructive pulmonary disease) ([place])   . Coronary artery disease   . History of stomach ulcers   . Hypertension    Past Surgical History:  Procedure Laterality Date  . LUNG REMOVAL, PARTIAL Left    Family History  Problem Relation Age of Onset  . Breast cancer Neg Hx    Social History   Tobacco Use  . Smoking status: Current Every Day Smoker    Packs/day: 1.00    Years: 66.00    Pack years: 66.00  . Smokeless tobacco: Never Used  Substance Use Topics  . Alcohol use: Not Currently    Comment: use to drink alot ( vodka) 6 months ago   No Known Allergies Prior to Admission medications   Medication Sig Start Date End Date Taking? Authorizing Provider  Albuterol Sulfate (PROAIR RESPICLICK) 272 (90  Base) MCG/ACT AEPB Inhale 2 puffs into the lungs every 6 (six) hours as needed. 06/28/20  Yes Danford, Suann Larry, MD  alendronate (FOSAMAX) 70 MG tablet Take 70 mg by mouth once a week. Take with a full glass of water on an empty stomach.   Yes [provider]  amLODipine (NORVASC) 10 MG tablet Take 10 mg by mouth daily.   Yes [provider]  atorvastatin (LIPITOR) 40 MG tablet Take 40 mg by mouth daily.   Yes [provider]  calcium carbonate (OS-CAL - DOSED IN MG OF ELEMENTAL CALCIUM) 1250 (500 Ca) MG tablet Take 1 tablet by mouth 2 (two) times daily with a meal.    Yes [provider]  Cholecalciferol 1000 units capsule Take 1,000 Units by mouth daily.   Yes [provider]  docusate sodium (COLACE) 100 MG capsule Take 100 mg by mouth daily.   Yes [provider]  gabapentin (NEURONTIN) 300 MG capsule Take 300 mg by mouth 3 (three) times daily.   Yes [provider]  lisinopril (ZESTRIL) 10 MG tablet Take 10 mg by mouth daily.    Yes [provider]  loperamide (IMODIUM) 2 MG capsule Take 4 mg by mouth as needed for diarrhea or loose stools.   Yes [provider]  meloxicam (MOBIC) 7.5 MG tablet meloxicam 7.5 mg tablet  TK 1 T PO  D PRN  Yes [provider]  metoprolol succinate (TOPROL-XL) 25 MG 24 hr tablet Take 25 mg by mouth daily.   Yes [provider]  RABEprazole (ACIPHEX) 20 MG tablet Take 20 mg by mouth daily. 09/16/19 09/15/20 Yes [provider]    Review of Systems  Constitutional: Positive for fatigue (minimal). Negative for appetite change.  HENT: Negative for congestion, postnasal drip and sore throat.   Eyes: Negative.   Respiratory: Negative for cough and shortness of breath.   Cardiovascular: Negative for chest pain, palpitations and leg swelling.  Gastrointestinal: Negative for abdominal distention and abdominal pain.  Endocrine: Negative.   Genitourinary:  Negative.   Musculoskeletal: Positive for back pain. Negative for neck pain.  Skin: Negative.   Allergic/Immunologic: Negative.   Neurological: Negative for dizziness and light-headedness.  Hematological: Negative for adenopathy. Does not bruise/bleed easily.  Psychiatric/Behavioral: Negative for dysphoric mood and sleep disturbance (sleeping on 2 pillows). The patient is not nervous/anxious.    Vitals:   07/09/20 1021  BP: 138/68  Pulse: (!) 50  Resp: 18  SpO2: 100%  Weight: 104 lb (47.2 kg)  Height: 5\' 3"  (1.6 m)   Wt Readings from Last 3 Encounters:  07/09/20 104 lb (47.2 kg)  06/28/20 109 lb 9.1 oz (49.7 kg)  07/16/19 126 lb 14.4 oz (57.6 kg)   Lab Results  Component Value Date   CREATININE 0.72 06/28/2020   CREATININE 1.10 (H) 06/27/2020   CREATININE 0.95 06/26/2020    Physical Exam Vitals and nursing note reviewed.  Constitutional:      Appearance: Normal appearance.  HENT:     Head: Normocephalic and atraumatic.  Cardiovascular:     Rate and Rhythm: Regular rhythm. Bradycardia present.  Pulmonary:     Effort: Pulmonary effort is normal. No respiratory distress.     Breath sounds: No wheezing or rales.  Abdominal:     Palpations: Abdomen is soft.     Tenderness: There is no abdominal tenderness.  Musculoskeletal:        General: No tenderness.     Cervical back: Normal range of motion and neck supple.     Right lower leg: No edema.     Left lower leg: No edema.  Skin:    General: Skin is warm and dry.  Neurological:     General: No focal deficit present.     Mental Status: She is alert and oriented to person, place, and time.  Psychiatric:        Mood and Affect: Mood normal.        Behavior: Behavior normal.        Thought Content: Thought content normal.    Assessment & Plan:  1: Chronic heart failure with preserved ejection fraction along with structural changes (LVH)- - NYHA class II - euvolemic today - scales given; instructed to weigh daily  and call for an overnight weight gain of >2 pounds or a weekly weight gain of >5 pounds - says that she enjoys eating salt and that her nephew cooks for her sometimes and overly salts things; reviewed the importance of not adding any salt to anything and trying to follow a low sodium diet - consider changing her lisinopril to entresto but she's not sure exactly of what she's taking - BNP 06/26/20 was 349.6 - reports receiving both covid vaccines  2: HTN- - BP looks good today & she hasn't taken her medications yet today - saw PCP Vevelyn Royals) 03/09/20 - BMP 06/28/20 reviewed and showed sodium  129, potassium 3.1, creatinine 0.72 and GFR >60  3: COPD/ tobacco use- - wearing oxygen at 2L around the clock - quit smoking 06/26/20  4: Alcohol use- - 1-2 beers daily; has been drinking "all my life"; previously drank vodka daily a few years ago   Patient did not bring her medications nor a list. Each medication was verbally reviewed with the patient and she was encouraged to bring the bottles to every visit to confirm accuracy of list.  Return in 2 months or sooner for any questions/problems before then.

## 2020-07-09 ENCOUNTER — Other Ambulatory Visit: Payer: Self-pay

## 2020-07-09 ENCOUNTER — Encounter: Payer: Self-pay | Admitting: Family

## 2020-07-09 ENCOUNTER — Ambulatory Visit: Payer: Medicare Other | Attending: Family | Admitting: Family

## 2020-07-09 VITALS — BP 138/68 | HR 50 | Resp 18 | Ht 63.0 in | Wt 104.0 lb

## 2020-07-09 DIAGNOSIS — I110 Hypertensive heart disease with heart failure: Secondary | ICD-10-CM | POA: Diagnosis present

## 2020-07-09 DIAGNOSIS — I10 Essential (primary) hypertension: Secondary | ICD-10-CM

## 2020-07-09 DIAGNOSIS — I5032 Chronic diastolic (congestive) heart failure: Secondary | ICD-10-CM

## 2020-07-09 DIAGNOSIS — I2510 Atherosclerotic heart disease of native coronary artery without angina pectoris: Secondary | ICD-10-CM | POA: Diagnosis not present

## 2020-07-09 DIAGNOSIS — I509 Heart failure, unspecified: Secondary | ICD-10-CM | POA: Insufficient documentation

## 2020-07-09 DIAGNOSIS — Z7289 Other problems related to lifestyle: Secondary | ICD-10-CM | POA: Diagnosis not present

## 2020-07-09 DIAGNOSIS — Z85118 Personal history of other malignant neoplasm of bronchus and lung: Secondary | ICD-10-CM | POA: Diagnosis not present

## 2020-07-09 DIAGNOSIS — J449 Chronic obstructive pulmonary disease, unspecified: Secondary | ICD-10-CM | POA: Diagnosis not present

## 2020-07-09 DIAGNOSIS — Z789 Other specified health status: Secondary | ICD-10-CM

## 2020-07-09 DIAGNOSIS — Z79899 Other long term (current) drug therapy: Secondary | ICD-10-CM | POA: Insufficient documentation

## 2020-07-09 DIAGNOSIS — F1090 Alcohol use, unspecified, uncomplicated: Secondary | ICD-10-CM

## 2020-07-09 DIAGNOSIS — Z791 Long term (current) use of non-steroidal anti-inflammatories (NSAID): Secondary | ICD-10-CM | POA: Insufficient documentation

## 2020-07-09 DIAGNOSIS — Z87891 Personal history of nicotine dependence: Secondary | ICD-10-CM | POA: Insufficient documentation

## 2020-07-09 NOTE — Patient Instructions (Signed)
Begin weighing daily and call for an overnight weight gain of > 2 pounds or a weekly weight gain of >5 pounds. 

## 2020-07-19 DIAGNOSIS — Z85118 Personal history of other malignant neoplasm of bronchus and lung: Secondary | ICD-10-CM

## 2020-07-19 DIAGNOSIS — J449 Chronic obstructive pulmonary disease, unspecified: Secondary | ICD-10-CM | POA: Diagnosis present

## 2020-07-19 DIAGNOSIS — I2510 Atherosclerotic heart disease of native coronary artery without angina pectoris: Secondary | ICD-10-CM | POA: Diagnosis present

## 2020-07-20 ENCOUNTER — Ambulatory Visit
Admission: RE | Admit: 2020-07-20 | Discharge: 2020-07-20 | Disposition: A | Discharge status: Home / Self Care | Payer: Medicare Other | Source: Ambulatory Visit | Attending: Obstetrics and Gynecology | Admitting: Obstetrics and Gynecology

## 2020-07-20 DIAGNOSIS — N838 Other noninflammatory disorders of ovary, fallopian tube and broad ligament: Secondary | ICD-10-CM | POA: Diagnosis not present

## 2020-07-21 ENCOUNTER — Encounter: Payer: Self-pay | Admitting: Obstetrics and Gynecology

## 2020-07-21 ENCOUNTER — Other Ambulatory Visit: Payer: Self-pay

## 2020-07-21 ENCOUNTER — Inpatient Hospital Stay: Payer: Medicare Other | Attending: Obstetrics and Gynecology | Admitting: Obstetrics and Gynecology

## 2020-07-21 VITALS — BP 119/64 | HR 47 | Temp 97.5°F | Resp 16 | Ht 63.0 in | Wt 104.4 lb

## 2020-07-21 DIAGNOSIS — G8929 Other chronic pain: Secondary | ICD-10-CM | POA: Diagnosis not present

## 2020-07-21 DIAGNOSIS — N838 Other noninflammatory disorders of ovary, fallopian tube and broad ligament: Secondary | ICD-10-CM

## 2020-07-21 DIAGNOSIS — I509 Heart failure, unspecified: Secondary | ICD-10-CM | POA: Insufficient documentation

## 2020-07-21 DIAGNOSIS — I429 Cardiomyopathy, unspecified: Secondary | ICD-10-CM | POA: Insufficient documentation

## 2020-07-21 DIAGNOSIS — I447 Left bundle-branch block, unspecified: Secondary | ICD-10-CM | POA: Insufficient documentation

## 2020-07-21 DIAGNOSIS — I712 Thoracic aortic aneurysm, without rupture: Secondary | ICD-10-CM | POA: Insufficient documentation

## 2020-07-21 DIAGNOSIS — F17210 Nicotine dependence, cigarettes, uncomplicated: Secondary | ICD-10-CM | POA: Diagnosis not present

## 2020-07-21 DIAGNOSIS — E871 Hypo-osmolality and hyponatremia: Secondary | ICD-10-CM | POA: Diagnosis not present

## 2020-07-21 DIAGNOSIS — I110 Hypertensive heart disease with heart failure: Secondary | ICD-10-CM | POA: Insufficient documentation

## 2020-07-21 DIAGNOSIS — J449 Chronic obstructive pulmonary disease, unspecified: Secondary | ICD-10-CM | POA: Diagnosis not present

## 2020-07-21 DIAGNOSIS — I2510 Atherosclerotic heart disease of native coronary artery without angina pectoris: Secondary | ICD-10-CM | POA: Diagnosis not present

## 2020-07-21 DIAGNOSIS — M40209 Unspecified kyphosis, site unspecified: Secondary | ICD-10-CM | POA: Insufficient documentation

## 2020-07-21 DIAGNOSIS — N839 Noninflammatory disorder of ovary, fallopian tube and broad ligament, unspecified: Secondary | ICD-10-CM

## 2020-07-21 DIAGNOSIS — R971 Elevated cancer antigen 125 [CA 125]: Secondary | ICD-10-CM | POA: Insufficient documentation

## 2020-07-21 DIAGNOSIS — M545 Low back pain: Secondary | ICD-10-CM | POA: Insufficient documentation

## 2020-07-21 NOTE — Progress Notes (Signed)
Pt states her concern is her diarrhea , she has some days 2-3 days in a row and other times it can be 2-3 weeks for diarrhea. She has lost wt since [facility] with [facility]. She went home with oxygen for 2 weeks then stopped. Gave her ensure samples for her to try to see if that can help with wt. No gyn concerns and she is here to get u/s results. She has sopped drinking and smoking since she got out of hospital.

## 2020-07-21 NOTE — Progress Notes (Signed)
Gynecologic Oncology Interval Visit   Referring Provider: Zeb Comfort, MD [address] [place], [place] [address] [phone]  Chief Concern: Bilateral ovarian masses being managed expectantly  Subjective:  Felicia Sellers is a 79 y.o. female, initially seen in consultation from Dr. Pricilla Riffle for bilateral ovarian masses, who returns to clinic today for follow-up.  CA 125  07/16/19 74.1 11/06/18 78.5 08/07/18 100  HE4 07/16/19 200 11/06/18 200 08/22/18 228  07/20/20- US Pelvic Complete Unremarkable uterus and endometrial complex. Complicated bilateral cystic adnexal masses within the pelvis measuring up to 11.1 cm left (previously 10.4 cm) and 4.5 cm right (previously 3.3 cm) suspicious for bilateral ovarian neoplasms. Slightly increased in size.   She returns to clinic for continued observation. No pain or new complaints.   Gynecologic History:  Felicia Sellers is a pleasant female, initially seen in consultation from Dr. Dema Severin for bilateral ovarian masses, who returns to clinic today for follow-up.   US Abdomen 07/08/2018 1. Tiny stone versus tumefactive sludge within the gallbladder. Negative for acute cholecystitis or biliary dilatation 2. 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 Recommend CTA to more thoroughly evaluate the aorta.  CT scan 07/24/2018 1. Bilateral heterogeneous partially cystic and septated ovarian masses. Left ovarian complex mass measures 10.6 x 6.1 cm. Right ovarian mass measures 4.5 x 2.8 cm. No ascites or evidence of peritoneal disease by CT.  2. Atherosclerotic disease involving celiac, SMA, renals, IMA, and iliac vessels.  3. Diverticulosis without acute inflammatory process 4.  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   She was symptomatic with weight gain and abdominal pressure.  She has chronic shortness of breath and leg pain  with walking.  Chronic cough.  History of kyphosis with chronic back and joint pain.  She reports distant history of lung cancer but has not seen pulmonology in "long time, cannot remember his name".  CT finding of thoracic aneurysm prompted referral to Dr. Rodman Comp of alcohol abuse but quit drinking approximately 01/2018.   08/22/18- PET 1.  Complex cystic left adnexal lesion is photopenic with multi septal septations and varying density contents.  Correlation with multi modality imaging and tumor markers is recommended for management. 2.  Right ovarian lesion measures 4.4 x 2.8 cm, solid-appearing with low metabolic activity with maximum SUV of 2.6.  Considered to be abnormally large for ovary in this age group and low-grade activity could represent low-grade tumor.  Multimodality imaging such as ultrasound and tumor markers recommended. 3.  No evidence of metastatic spread in the abdomen. 4.  Several borderline enlarged mediastinal lymph nodes are present and have a low-grade accentuated metabolic activity mildly above blood pool.  Not characteristic for metastatic ovarian cancer but they merit surveillance with suggested follow-up of chest CT in 3 months. 5.  Faint nodularity in both upper lobes with largest nodule measuring 5 mm.  Not hypermetabolic but the low sensitive PET/threshold and recommend follow-up with CT. 6.  Other findings of potential clinical significance: Aortic arteriosclerosis, emphysema, coronary arteriosclerosis, descending thoracic aortic aneurysm, mild cardiomegaly, old granulomatous disease, sigmoid colon diverticulosis, lumbar spondylosis, degenerative disc disease.  Tumor markers:  CA 125 on 08/07/18 100 HE4 on 08/22/18 228 ROMA 72%   Radiology was unable to biopsy the lesion either by abdominal or vaginal approach and recommended PET scan.   After discussion of surgery vs conservative observation she opted for surveillance.  11/06/2018  CA125 = 78.5 (decreased  compared to 100 on 08/07/2018) HE4=200 (decreased compared to 100 on 08/22/2018)  ROMA 65%  Pelvic Ultrasound 11/06/2018. Large LEFT adnexal mass 11.2 x 5.7 x 6.5 cm, containing 2 distinct regions, with diffuse homogeneous internal hypoechogenicity throughout a large region and contiguous hypoechoic minimally complicated cystic component. No internal blood flow is seen within this lesion on color Doppler imaging. Single small echogenic focus question calcification at wall.  RIGHT adnexal mass seen on the prior PET-CT and CT exams could not be demonstrated on either transabdominal or endovaginal imaging, likely obscured by bowel.  IMPRESSION: Large LEFT adnexal nonspecific complex cystic lesion 11.2 x 5.7 x 6.5 cm, a large portion of which demonstrates homogeneous hypoechogenicity throughout suggesting an endometrioma though there is an adjacent component of hypoechoic fluid; no internal blood flow within this LEFT adnexal mass by color Doppler imaging.    Other medical issues:  She was seen by cardiology, Dr. Saralyn Pilar, on 9/19, for history of nonischemic cardiomyopathy.  2D echo on 01/01/2018 revealed LVEF of 50% with moderate mitral and tricuspid regurgitation. She has intermittent peripheral edema with right worse than left.  She has chronic exertional dyspnea due to underlying COPD and ongoing tobacco abuse.    CT angios chest scheduled for 09/17/2018 to complete evaluation of aortic aneurysm.  She was seen by Dr. Lucky Cowboy, vascular surgery on 08/16/2018. He felt that the visualized portion would be a moderate aneurysm that would not need emergent repair. 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 Radiology recommended semi-annual imaging followup by CTA or MRA.   Problem List: Patient Active Problem List   Diagnosis Date Noted  . Community acquired pneumonia 06/27/2020  . Acute CHF (congestive heart failure) ([place]) 06/26/2020  .  Leukocytosis 06/26/2020  . Hypoxia 06/26/2020  . Thoracic aortic aneurysm without rupture ([place]) 08/16/2018  . Kyphosis 08/07/2018  . Hypertension 08/07/2018  . Tobacco use 08/07/2018  . Age-related osteoporosis without current pathological fracture 12/21/2016  . Chronic hyponatremia 11/04/2015  . Chronic low back pain 06/18/2015  . Cardiomyopathy ([place]) 08/13/2014  . Left bundle branch block (LBBB) 07/23/2014  . Pedal edema 07/23/2014  . SOB (shortness of breath) on exertion 07/23/2014    Past Medical History: Past Medical History:  Diagnosis Date  . Cancer ([place])    right lung removed - lung cancer 1/3 lung removed   . CHF (congestive heart failure) ([place])   . COPD (chronic obstructive pulmonary disease) ([place])   . Coronary artery disease   . History of stomach ulcers   . Hypertension     Past Surgical History: Past Surgical History:  Procedure Laterality Date  . LUNG REMOVAL, PARTIAL Left     Past Gynecologic History:  G1P4 Distant history of birth control pill use Denies history of STIs or abnormal pap smears  OB History:  OB History  No obstetric history on file.    Family History: Family History  Problem Relation Age of Onset  . Breast cancer Neg Hx     Social History: Social History   Socioeconomic History  . Marital status: Single    Spouse name: Not on file  . Number of children: Not on file  . Years of education: Not on file  . Highest education level: Not on file  Occupational History  . Not on file  Tobacco Use  . Smoking status: Current Every Day Smoker    Packs/day: 1.00    Years: 66.00  Pack years: 66.00  . Smokeless tobacco: Never Used  Vaping Use  . Vaping Use: Never used  Substance and Sexual Activity  . Alcohol use: Not Currently    Comment: use to drink alot ( vodka) 6 months ago  . Drug use: Never  . Sexual activity: Not Currently  Other Topics Concern  . Not on file  Social History Narrative  . Not on file   Social  Determinants of Health   Financial Resource Strain:   . Difficulty of Paying Living Expenses: Not on file  Food Insecurity:   . Worried About Charity fundraiser in the Last Year: Not on file  . Ran Out of Food in the Last Year: Not on file  Transportation Needs:   . Lack of Transportation (Medical): Not on file  . Lack of Transportation (Non-Medical): Not on file  Physical Activity:   . Days of Exercise per Week: Not on file  . Minutes of Exercise per Session: Not on file  Stress:   . Feeling of Stress : Not on file  Social Connections:   . Frequency of Communication with Friends and Family: Not on file  . Frequency of Social Gatherings with Friends and Family: Not on file  . Attends Religious Services: Not on file  . Active Member of Clubs or Organizations: Not on file  . Attends Archivist Meetings: Not on file  . Marital Status: Not on file  Intimate Partner Violence:   . Fear of Current or Ex-Partner: Not on file  . Emotionally Abused: Not on file  . Physically Abused: Not on file  . Sexually Abused: Not on file    Allergies: No Known Allergies  Review of Systems General:  no complaints Skin: no complaints Eyes: no complaints HEENT: no complaints Breasts: no complaints Pulmonary: no complaints Cardiac: no complaints Gastrointestinal: no complaints Genitourinary/Sexual: no complaints Ob/Gyn: no complaints Musculoskeletal: no complaints Hematology: no complaints Neurologic/Psych: no complaints   Objective:  Physical Examination:  There were no vitals taken for this visit.   ECOG Performance Status: 2 - Symptomatic, <50% confined to bed  GENERAL: elderly, thin built.  HEENT:  PERRL, neck supple with midline trachea.  NODES:  No cervical, supraclavicular, axillary, or inguinal lymphadenopathy palpated.  LUNGS:  Clear to auscultation bilaterally.  No wheezes or rhonchi. HEART: Bradycardia.  ABDOMEN:  Soft, nontender.  Positive, normoactive bowel  sounds.  EXTREMITIES:  No peripheral edema.   SKIN:  Clear with no obvious rashes or skin changes.  NEURO:  Nonfocal. Well oriented.  Appropriate affect.  Pelvic: exam chaperoned by nurse EGBUS: normal appearing vulva with no masses, tenderness or lesions Cervix: no lesions, nontender, mobile Vagina: no lesions, no discharge or bleeding Uterus: normal size, nontender, mobile Adnexa: right adnexal fullness and firm area adjacent to the uterus. On the left, unable to determine size but very nodularity and firm. Uable to palpate the entire lesion. Rectovaginal: confirmatory - the mass seems above the cul-de-sac   Lab Review As per HPI  Radiologic Imaging: As per HPI     Assessment:  Felicia Sellers is a 79 y.o. female diagnosed with asymptomatic bilateral adnexal masses 9/19 concerning for malignancy (Partially cystic and septated ovarian masses. Left ovarian complex mass measures 10.6 x 6.1 cm. Right ovarian mass measures 4.5 x 2.8 cm). No ascites which is reassuring. Elevated CA125, HE4 and ROMA score. CA125 my be elevated due to CHF. Repeat tumor markers decreased and on imaging cystic masses are overall stable  with slight increase on recent US. She remains asymptomatic. She has significant co-morbid conditions and poor surgical candidate.   Medical co-morbidities complicating care: HTN, Tobacco use, Cardiomyopathy ([place]), s/p lung resection, diffuse atherosclerosis, and abdominal aortic aneurysm up to 4.6 cm.  Plan:   Problem List Items Addressed This Visit    None    Visit Diagnoses    Ovarian mass    -  Primary      Suggested return to clinic in 12 months for repeat US, CA125, and HE4. If worsening mass then consider radiologic guided biopsy.   Beckey Rutter, DNP, AGNP-C [place] at [facility] [phone] (clinic)  I personally interviewed and examined the patient. Agreed with the above/below plan of care. I have directly contributed to assessment and plan of care of  this patient and educated and discussed with patient and family.  Mellody Drown, MD  CC:  Zeb Comfort, MD [address] [place], [place] [address] [phone]

## 2020-09-08 ENCOUNTER — Encounter: Payer: Self-pay | Admitting: Family

## 2020-09-08 ENCOUNTER — Ambulatory Visit: Payer: Medicare Other | Attending: Family | Admitting: Family

## 2020-09-08 ENCOUNTER — Other Ambulatory Visit: Payer: Self-pay

## 2020-09-08 VITALS — BP 132/63 | HR 58 | Resp 16 | Ht 63.0 in | Wt 106.0 lb

## 2020-09-08 DIAGNOSIS — Z85118 Personal history of other malignant neoplasm of bronchus and lung: Secondary | ICD-10-CM | POA: Insufficient documentation

## 2020-09-08 DIAGNOSIS — I110 Hypertensive heart disease with heart failure: Secondary | ICD-10-CM | POA: Insufficient documentation

## 2020-09-08 DIAGNOSIS — I5032 Chronic diastolic (congestive) heart failure: Secondary | ICD-10-CM | POA: Diagnosis not present

## 2020-09-08 DIAGNOSIS — Z791 Long term (current) use of non-steroidal anti-inflammatories (NSAID): Secondary | ICD-10-CM | POA: Insufficient documentation

## 2020-09-08 DIAGNOSIS — Z79899 Other long term (current) drug therapy: Secondary | ICD-10-CM | POA: Insufficient documentation

## 2020-09-08 DIAGNOSIS — G479 Sleep disorder, unspecified: Secondary | ICD-10-CM | POA: Diagnosis not present

## 2020-09-08 DIAGNOSIS — Z886 Allergy status to analgesic agent status: Secondary | ICD-10-CM | POA: Insufficient documentation

## 2020-09-08 DIAGNOSIS — I2510 Atherosclerotic heart disease of native coronary artery without angina pectoris: Secondary | ICD-10-CM | POA: Insufficient documentation

## 2020-09-08 DIAGNOSIS — J449 Chronic obstructive pulmonary disease, unspecified: Secondary | ICD-10-CM | POA: Diagnosis not present

## 2020-09-08 DIAGNOSIS — Z87891 Personal history of nicotine dependence: Secondary | ICD-10-CM | POA: Insufficient documentation

## 2020-09-08 DIAGNOSIS — F1020 Alcohol dependence, uncomplicated: Secondary | ICD-10-CM | POA: Insufficient documentation

## 2020-09-08 DIAGNOSIS — Z7289 Other problems related to lifestyle: Secondary | ICD-10-CM

## 2020-09-08 DIAGNOSIS — I10 Essential (primary) hypertension: Secondary | ICD-10-CM

## 2020-09-08 DIAGNOSIS — Z789 Other specified health status: Secondary | ICD-10-CM

## 2020-09-08 NOTE — Progress Notes (Signed)
Patient ID: Felicia Sellers, female    DOB: 06/12/1941, 79 y.o.   MRN: [id number]  Congestive Heart Failure Associated symptoms include fatigue (minimal). Pertinent negatives include no abdominal pain, chest pain, palpitations or shortness of breath.    Felicia Sellers is a 79 y/o female with a history of lung cancer, CAD, HTN, COPD, recent tobacco use, current alcohol use and chronic heart failure.  Echo report from 06/28/20 reviewed and showed an EF of 70-75% along with moderate LVH, moderately elevated PA pressure, moderate MR and mild/moderate LAE.   Admitted 06/26/20 due to shortness of breath and fatigue. CTA showed left lower lobe pneumonia. Given antibiotics. Also given prednisone due to COPD exacerbation. Could not be weaned off oxygen. Discharged after 2 days.   She presents today for her follow up visit with a chief complaint of sleep disturbance. Patient reports she has had no increase in her shortness of breath and that she is still wearing 2L [place] daily. Patient states she has stopped drinking alcohol since September and has not been smoking. Patient reports since she stopped drinking alcohol she is having trouble sleeping and often gets up multiple times throughout the night. Patient denies increased SOB, chest pain, dizziness, headaches, N/V/D.   Past Medical History:  Diagnosis Date  . Cancer ([place])    right lung removed - lung cancer 1/3 lung removed   . CHF (congestive heart failure) ([place])   . COPD (chronic obstructive pulmonary disease) ([place])   . Coronary artery disease   . History of stomach ulcers   . Hypertension   . Pneumonia    Past Surgical History:  Procedure Laterality Date  . LUNG REMOVAL, PARTIAL Left    Family History  Problem Relation Age of Onset  . Breast cancer Neg Hx    Social History   Tobacco Use  . Smoking status: Former Smoker    Packs/day: 1.00    Years: 66.00    Pack years: 66.00    Quit date: 06/26/2020    Years since quitting: 0.2  . Smokeless  tobacco: Never Used  Substance Use Topics  . Alcohol use: Not Currently    Comment: use to drink alot ( vodka) 6 months ago   Allergies  Allergen Reactions  . Aspirin Other (See Comments)    High risk of bleeding   Prior to Admission medications   Medication Sig Start Date End Date Taking? Authorizing Provider  Albuterol Sulfate (PROAIR RESPICLICK) 631 (90 Base) MCG/ACT AEPB Inhale 2 puffs into the lungs every 6 (six) hours as needed. 06/28/20  Yes Danford, Suann Larry, MD  alendronate (FOSAMAX) 70 MG tablet Take 70 mg by mouth once a week. Take with a full glass of water on an empty stomach.   Yes [provider]  amLODipine (NORVASC) 10 MG tablet Take 10 mg by mouth daily.   Yes [provider]  atorvastatin (LIPITOR) 40 MG tablet Take 40 mg by mouth daily.   Yes [provider]  calcium carbonate (OS-CAL - DOSED IN MG OF ELEMENTAL CALCIUM) 1250 (500 Ca) MG tablet Take 1 tablet by mouth 2 (two) times daily with a meal.    Yes [provider]  Cholecalciferol 1000 units capsule Take 1,000 Units by mouth daily.   Yes [provider]  docusate sodium (COLACE) 100 MG capsule Take 100 mg by mouth daily.   Yes [provider]  gabapentin (NEURONTIN) 300 MG capsule Take 300 mg by mouth 3 (three) times daily.  Yes [provider]  lisinopril (ZESTRIL) 10 MG tablet Take 10 mg by mouth daily.    Yes [provider]  loperamide (IMODIUM) 2 MG capsule Take 4 mg by mouth as needed for diarrhea or loose stools.   Yes [provider]  meloxicam (MOBIC) 7.5 MG tablet meloxicam 7.5 mg tablet  TK 1 T PO  D PRN   Yes [provider]  metoprolol succinate (TOPROL-XL) 25 MG 24 hr tablet Take 25 mg by mouth daily.   Yes [provider]  RABEprazole (ACIPHEX) 20 MG tablet Take 20 mg by mouth daily. 09/16/19 09/15/20 Yes [provider]    Review of Systems  Constitutional: Positive for fatigue  (minimal). Negative for appetite change.  HENT: Negative for congestion, postnasal drip and sore throat.   Eyes: Negative.   Respiratory: Negative for cough and shortness of breath.   Cardiovascular: Negative for chest pain, palpitations and leg swelling.  Gastrointestinal: Negative for abdominal distention and abdominal pain.  Endocrine: Negative.   Genitourinary: Negative.   Musculoskeletal: Positive for back pain. Negative for neck pain.  Skin: Negative.   Allergic/Immunologic: Negative.   Neurological: Negative for dizziness and light-headedness.  Hematological: Negative for adenopathy. Does not bruise/bleed easily.  Psychiatric/Behavioral: Negative for dysphoric mood and sleep disturbance (sleeping on 2 pillows). The patient is not nervous/anxious.    Vitals:   09/08/20 1342  BP: 132/63  Pulse: (!) 58  Resp: 16  SpO2: 97%  Weight: 106 lb (48.1 kg)  Height: 5\' 3"  (1.6 m)   Wt Readings from Last 3 Encounters:  09/08/20 106 lb (48.1 kg)  07/21/20 104 lb 6.4 oz (47.4 kg)  07/09/20 104 lb (47.2 kg)   Lab Results  Component Value Date   CREATININE 0.72 06/28/2020   CREATININE 1.10 (H) 06/27/2020   CREATININE 0.95 06/26/2020    Physical Exam Vitals and nursing note reviewed.  Constitutional:      Appearance: Normal appearance.  HENT:     Head: Normocephalic and atraumatic.  Cardiovascular:     Rate and Rhythm: Regular rhythm. Bradycardia present.  Pulmonary:     Effort: Pulmonary effort is normal. No respiratory distress.     Breath sounds: No wheezing or rales.  Abdominal:     Palpations: Abdomen is soft.     Tenderness: There is no abdominal tenderness.  Musculoskeletal:        General: No tenderness.     Cervical back: Normal range of motion and neck supple.     Right lower leg: No edema.     Left lower leg: No edema.  Skin:    General: Skin is warm and dry.  Neurological:     General: No focal deficit present.     Mental Status: She is alert and oriented  to person, place, and time.  Psychiatric:        Mood and Affect: Mood normal.        Behavior: Behavior normal.        Thought Content: Thought content normal.    Assessment & Plan:  1: Chronic heart failure with preserved ejection fraction along with structural changes (LVH)- - NYHA class II - euvolemic today - Patient reports she is trying to remember to weigh daily - Says she has cut back on her salt but still adds salt to some things. -Reviewed dash diet with patient and she says she is going her best -Patient reports her nephew often cooks and he is currently living with  her.  - BNP 06/26/20 was 349.6 - reports receiving both covid vaccines  2: HTN- - BP looks good today - saw PCP Vevelyn Royals) 07/19/20 - BMP 06/28/20 reviewed and showed sodium 129, potassium 3.1, creatinine 0.72 and GFR >60  3: COPD/ tobacco use- - wearing oxygen at 2L around the clock - quit smoking 06/26/20  4: Alcohol use- - Patient reports she has not had a beer since September -Reports that since she stopped drinking alcohol, she is having trouble sleeping and is having trouble staying asleep.   Patient did not bring her medications nor a list. Each medication was verbally reviewed with the patient and she was encouraged to bring the bottles to every visit to confirm accuracy of list.  Return here as needed.

## 2021-04-22 IMAGING — US US PELVIS COMPLETE WITH TRANSVAGINAL
1 series · 13 of 25 positions shown · non-contrast
Comparison: 07/21/2019

CLINICAL DATA: Thickened endometrial complex, follow-up, ovarian
mass, postmenopausal



[Series 1: us pelvis complete with transvaginal · 0.21mm/px · 105 acquisitions, 13 frames shown]
[im 1/105]
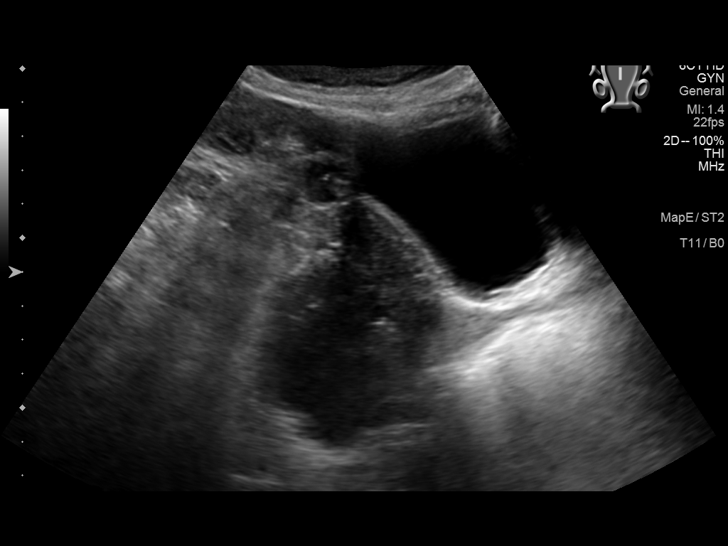
[im 9/105]
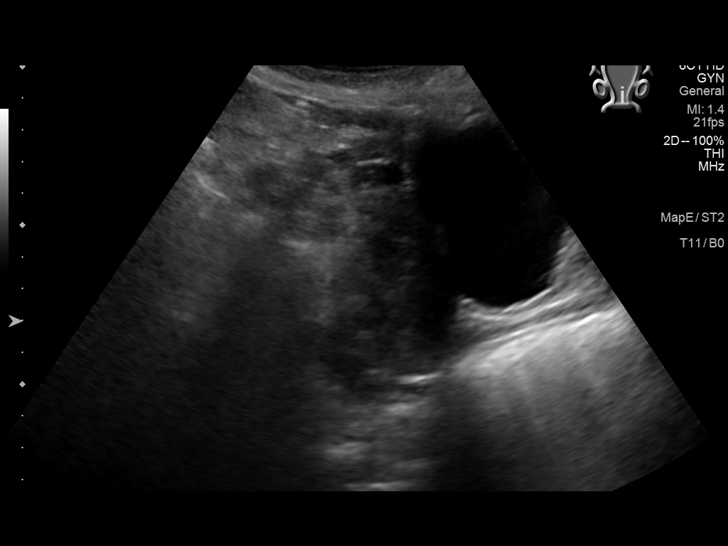
[im 18/105]
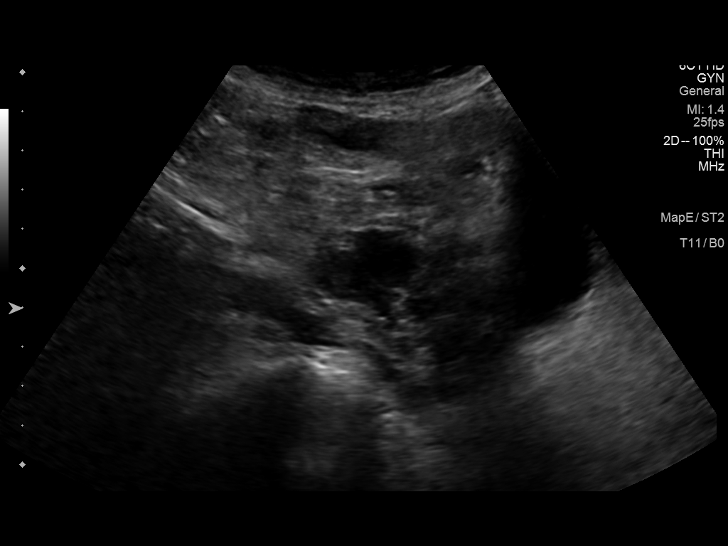
[im 27/105]
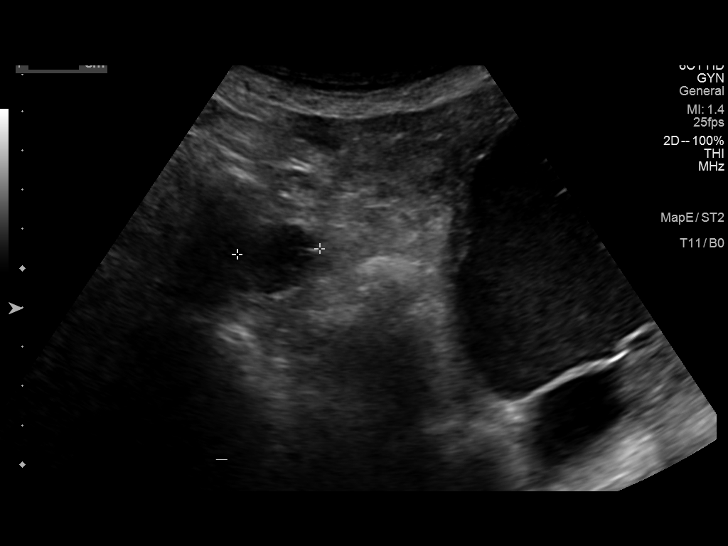
[im 35/105]
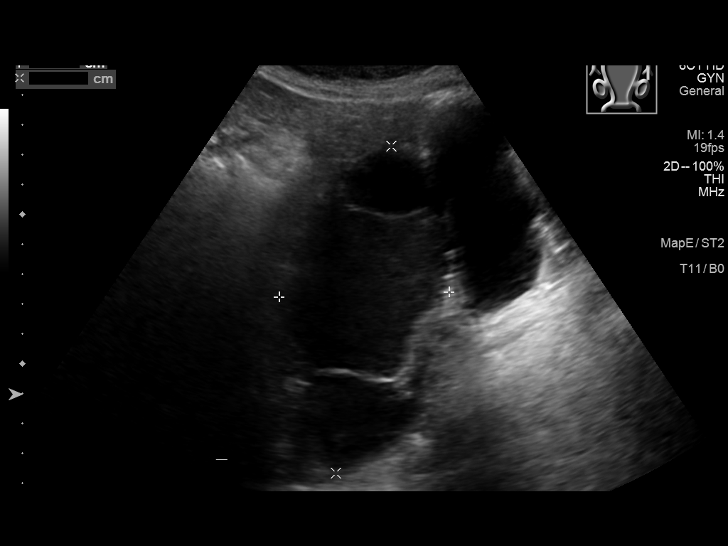
[im 44/105]
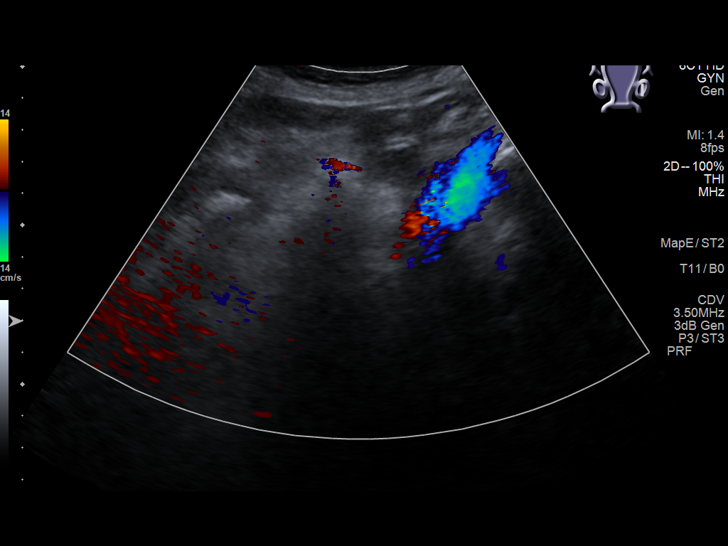
[im 53/105]
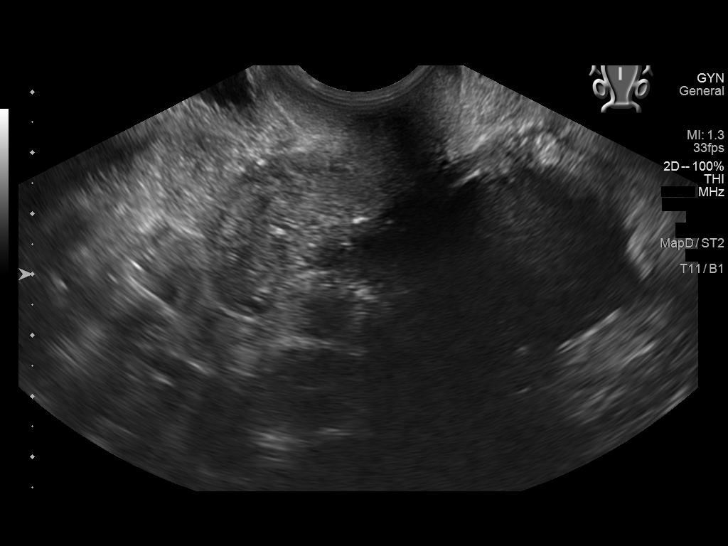
[im 61/105]
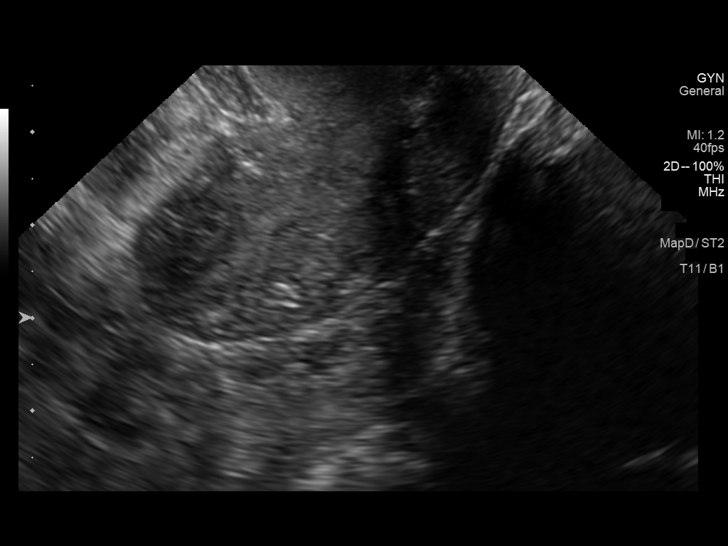
[im 70/105]
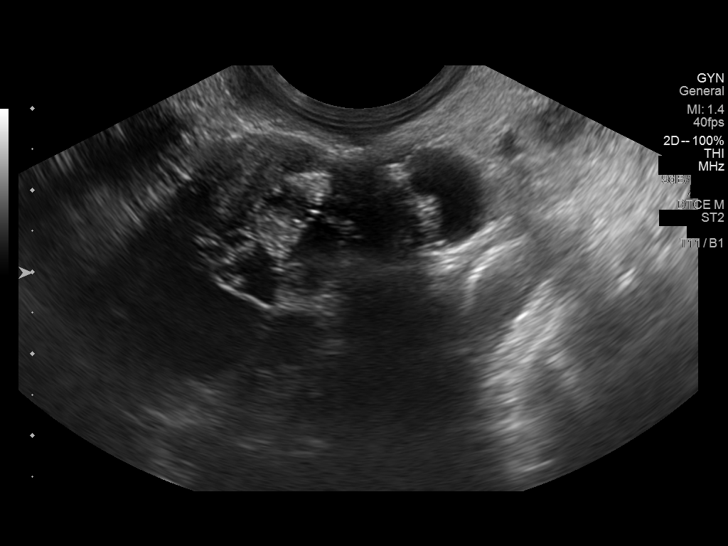
[im 79/105]
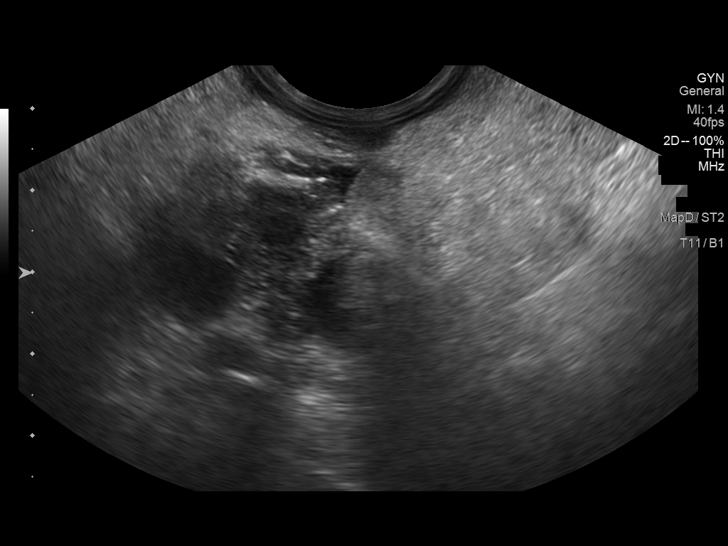
[im 87/105]
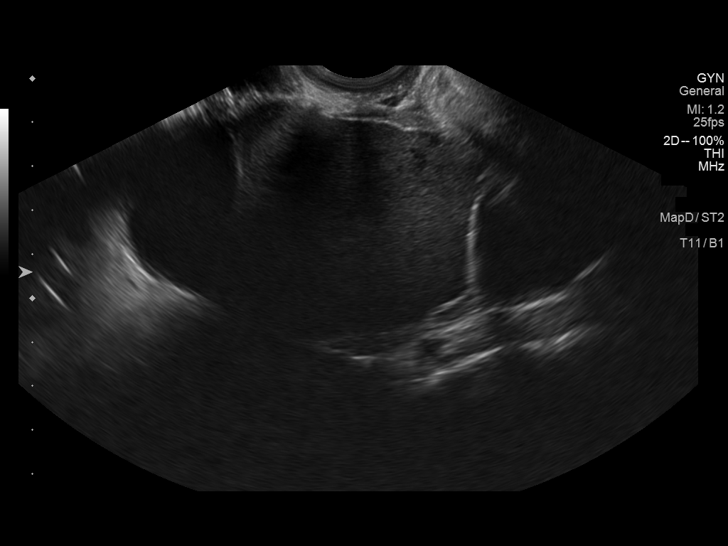
[im 96/105]
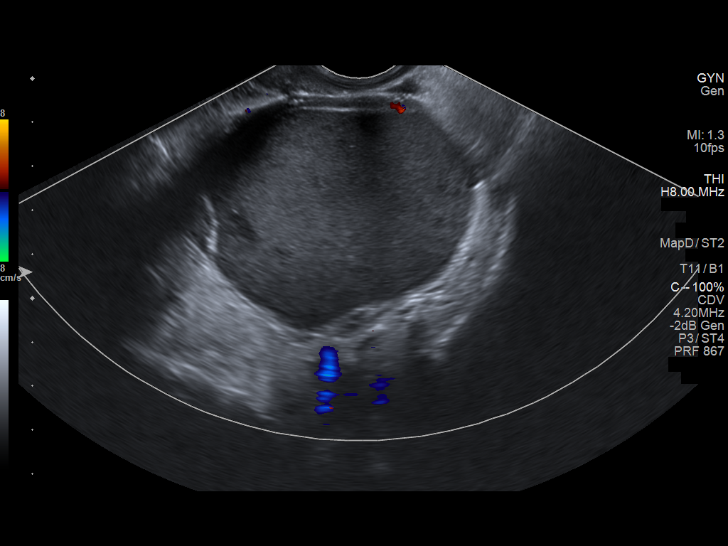
[im 105/105]
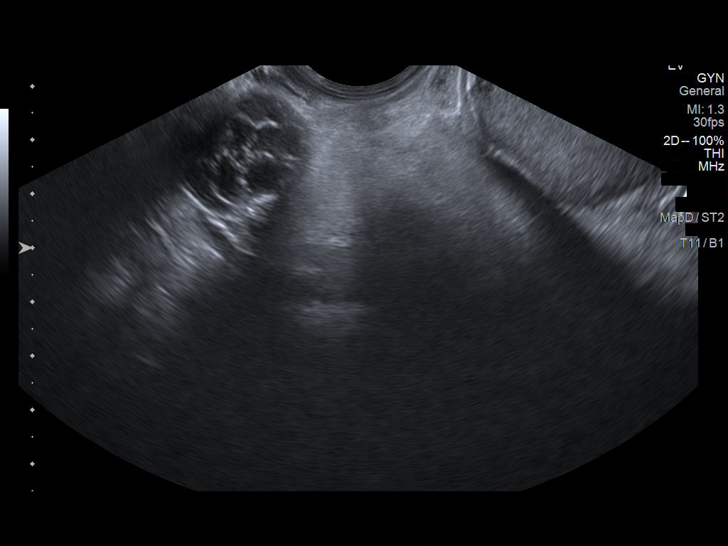

[13 of 25 positions shown; findings below may reference images not displayed]

FINDINGS: Uterus

Measurements: 5.6 x 2.5 x 3.9 cm = volume: 28 mL. Anteverted. Normal
morphology without mass

Endometrium

Thickness: 4 mm.  No endometrial fluid or focal abnormality

Right ovary

No normal appearing RIGHT ovary is visualized, see below

Left ovary

No normal appearing LEFT ovary is visualized, see below

Other findings

No free pelvic fluid. Complex cystic mass identified in LEFT adnexa
11.1 x 5.7 x 5.8 cm, multi-cystic appearance with several
septations, largest loculation containing diffuse low level internal
echogenicity. On the RIGHT, a multiloculated complex cystic RIGHT
adnexal mass is seen 4.5 x 2.9 x 3.0 cm, containing heterogeneous
echogenicity and multiple small complicated irregular loculation
which contain variable internal echogenicity. Findings are
suspicious for BILATERAL cystic ovarian neoplasms.
IMPRESSION: Unremarkable uterus and endometrial complex.

Complicated BILATERAL cystic adnexal masses within the pelvis
measuring up to 11.1 cm LEFT and 4.5 cm RIGHT, suspicious for
BILATERAL ovarian neoplasms.

These have slightly increased in sizes since the previous exam when
they measured 10.4 cm and 3.3 cm in greatest dimensions.

These results will be called to the ordering clinician or
representative by the Radiologist Assistant, and communication
documented in the PACS or [REDACTED].

## 2021-07-08 ENCOUNTER — Encounter: Payer: Self-pay | Admitting: *Deleted

## 2021-07-08 ENCOUNTER — Inpatient Hospital Stay
Admission: EM | Admit: 2021-07-08 | Discharge: 2021-07-13 | DRG: 193 | Disposition: A | Discharge status: Skilled Nursing Facility | Payer: Medicare Other | Attending: Internal Medicine | Admitting: Internal Medicine

## 2021-07-08 ENCOUNTER — Other Ambulatory Visit: Payer: Self-pay

## 2021-07-08 ENCOUNTER — Emergency Department: Payer: Medicare Other

## 2021-07-08 DIAGNOSIS — Z85118 Personal history of other malignant neoplasm of bronchus and lung: Secondary | ICD-10-CM

## 2021-07-08 DIAGNOSIS — A419 Sepsis, unspecified organism: Secondary | ICD-10-CM

## 2021-07-08 DIAGNOSIS — I10 Essential (primary) hypertension: Secondary | ICD-10-CM | POA: Diagnosis present

## 2021-07-08 DIAGNOSIS — I428 Other cardiomyopathies: Secondary | ICD-10-CM | POA: Diagnosis present

## 2021-07-08 DIAGNOSIS — E876 Hypokalemia: Secondary | ICD-10-CM | POA: Diagnosis present

## 2021-07-08 DIAGNOSIS — Z902 Acquired absence of lung [part of]: Secondary | ICD-10-CM

## 2021-07-08 DIAGNOSIS — N183 Chronic kidney disease, stage 3 unspecified: Secondary | ICD-10-CM | POA: Diagnosis present

## 2021-07-08 DIAGNOSIS — J9601 Acute respiratory failure with hypoxia: Secondary | ICD-10-CM | POA: Diagnosis present

## 2021-07-08 DIAGNOSIS — F0390 Unspecified dementia without behavioral disturbance: Secondary | ICD-10-CM | POA: Diagnosis present

## 2021-07-08 DIAGNOSIS — G9341 Metabolic encephalopathy: Secondary | ICD-10-CM | POA: Diagnosis present

## 2021-07-08 DIAGNOSIS — I130 Hypertensive heart and chronic kidney disease with heart failure and stage 1 through stage 4 chronic kidney disease, or unspecified chronic kidney disease: Secondary | ICD-10-CM | POA: Diagnosis present

## 2021-07-08 DIAGNOSIS — E785 Hyperlipidemia, unspecified: Secondary | ICD-10-CM | POA: Diagnosis present

## 2021-07-08 DIAGNOSIS — I4892 Unspecified atrial flutter: Secondary | ICD-10-CM | POA: Diagnosis present

## 2021-07-08 DIAGNOSIS — C7801 Secondary malignant neoplasm of right lung: Secondary | ICD-10-CM | POA: Diagnosis present

## 2021-07-08 DIAGNOSIS — I2510 Atherosclerotic heart disease of native coronary artery without angina pectoris: Secondary | ICD-10-CM | POA: Diagnosis present

## 2021-07-08 DIAGNOSIS — J439 Emphysema, unspecified: Secondary | ICD-10-CM | POA: Diagnosis present

## 2021-07-08 DIAGNOSIS — J189 Pneumonia, unspecified organism: Secondary | ICD-10-CM | POA: Diagnosis not present

## 2021-07-08 DIAGNOSIS — J9621 Acute and chronic respiratory failure with hypoxia: Secondary | ICD-10-CM

## 2021-07-08 DIAGNOSIS — I480 Paroxysmal atrial fibrillation: Secondary | ICD-10-CM | POA: Diagnosis present

## 2021-07-08 DIAGNOSIS — F17210 Nicotine dependence, cigarettes, uncomplicated: Secondary | ICD-10-CM | POA: Diagnosis present

## 2021-07-08 DIAGNOSIS — Z79899 Other long term (current) drug therapy: Secondary | ICD-10-CM

## 2021-07-08 DIAGNOSIS — Z7983 Long term (current) use of bisphosphonates: Secondary | ICD-10-CM

## 2021-07-08 DIAGNOSIS — J449 Chronic obstructive pulmonary disease, unspecified: Secondary | ICD-10-CM | POA: Diagnosis present

## 2021-07-08 DIAGNOSIS — I5032 Chronic diastolic (congestive) heart failure: Secondary | ICD-10-CM | POA: Diagnosis present

## 2021-07-08 DIAGNOSIS — E871 Hypo-osmolality and hyponatremia: Secondary | ICD-10-CM | POA: Diagnosis present

## 2021-07-08 DIAGNOSIS — Z20822 Contact with and (suspected) exposure to covid-19: Secondary | ICD-10-CM | POA: Diagnosis present

## 2021-07-08 LAB — CBC WITH DIFFERENTIAL/PLATELET
Abs Immature Granulocytes: 0.09 10*3/uL — ABNORMAL HIGH (ref 0.00–0.07)
Basophils Absolute: 0.1 10*3/uL (ref 0.0–0.1)
Basophils Relative: 1 %
Eosinophils Absolute: 0 10*3/uL (ref 0.0–0.5)
Eosinophils Relative: 0 %
HCT: 39.5 % (ref 36.0–46.0)
Hemoglobin: 13.4 g/dL (ref 12.0–15.0)
Immature Granulocytes: 1 %
Lymphocytes Relative: 15 %
Lymphs Abs: 2.3 10*3/uL (ref 0.7–4.0)
MCH: 31.4 pg (ref 26.0–34.0)
MCHC: 33.9 g/dL (ref 30.0–36.0)
MCV: 92.5 fL (ref 80.0–100.0)
Monocytes Absolute: 1 10*3/uL (ref 0.1–1.0)
Monocytes Relative: 7 %
Neutro Abs: 12.3 10*3/uL — ABNORMAL HIGH (ref 1.7–7.7)
Neutrophils Relative %: 76 %
Platelets: 270 10*3/uL (ref 150–400)
RBC: 4.27 MIL/uL (ref 3.87–5.11)
RDW: 13.2 % (ref 11.5–15.5)
WBC: 15.9 10*3/uL — ABNORMAL HIGH (ref 4.0–10.5)
nRBC: 0 % (ref 0.0–0.2)

## 2021-07-08 LAB — BRAIN NATRIURETIC PEPTIDE: B Natriuretic Peptide: 288.7 pg/mL — ABNORMAL HIGH (ref 0.0–100.0)

## 2021-07-08 LAB — APTT: aPTT: 36 seconds (ref 24–36)

## 2021-07-08 LAB — RESP PANEL BY RT-PCR (FLU A&B, COVID) ARPGX2
Influenza A by PCR: NEGATIVE
Influenza B by PCR: NEGATIVE
SARS Coronavirus 2 by RT PCR: NEGATIVE

## 2021-07-08 LAB — PROTIME-INR
INR: 1.1 (ref 0.8–1.2)
Prothrombin Time: 14 seconds (ref 11.4–15.2)

## 2021-07-08 LAB — TROPONIN I (HIGH SENSITIVITY): Troponin I (High Sensitivity): 14 ng/L (ref <18)

## 2021-07-08 LAB — LACTIC ACID, PLASMA: Lactic Acid, Venous: 1.4 mmol/L (ref 0.5–1.9)

## 2021-07-08 MED ORDER — METHYLPREDNISOLONE SODIUM SUCC 125 MG IJ SOLR
125.0000 mg | Freq: Once | INTRAMUSCULAR | Status: AC
  Administered 2021-07-08: 125 mg via INTRAVENOUS
  Filled 2021-07-08: qty 2

## 2021-07-08 MED ORDER — LACTATED RINGERS IV SOLN: INTRAVENOUS | Status: DC

## 2021-07-08 MED ORDER — SODIUM CHLORIDE 0.9 % IV SOLN
2.0000 g | INTRAVENOUS | Status: DC
  Administered 2021-07-08 – 2021-07-10 (×3): 2 g via INTRAVENOUS
  Filled 2021-07-08 (×4): qty 20

## 2021-07-08 MED ORDER — SODIUM CHLORIDE 0.9 % IV SOLN
500.0000 mg | INTRAVENOUS | Status: DC
  Administered 2021-07-08: 500 mg via INTRAVENOUS
  Filled 2021-07-08: qty 500

## 2021-07-08 MED ORDER — LACTATED RINGERS IV BOLUS (SEPSIS)
1000.0000 mL | Freq: Once | INTRAVENOUS | Status: AC
  Administered 2021-07-08: 1000 mL via INTRAVENOUS

## 2021-07-08 MED ORDER — IPRATROPIUM-ALBUTEROL 0.5-2.5 (3) MG/3ML IN SOLN
3.0000 mL | Freq: Once | RESPIRATORY_TRACT | Status: AC
  Administered 2021-07-08: 3 mL via RESPIRATORY_TRACT
  Filled 2021-07-08: qty 3

## 2021-07-08 NOTE — ED Notes (Signed)
Patient was 100% on 6L of O2 via [place]. Patient was put on room air per Dr. Tamala Julian. Patient is now 94% on room air.

## 2021-07-08 NOTE — ED Notes (Signed)
Report given to Jenn RN

## 2021-07-08 NOTE — Sepsis Progress Note (Signed)
Monitoring for code sepsis protocol. 

## 2021-07-08 NOTE — ED Triage Notes (Signed)
Per EMS report, patient c/o feeling short of breath today. Patient was 87% on room and rose to 92 % after Duoneb treatment and on 6L O2.

## 2021-07-08 NOTE — ED Provider Notes (Signed)
[facility] Emergency Department Provider Note ____________________________________________   Event Date/Time   First MD Initiated Contact with Patient 07/08/21 2153     (approximate)  I have reviewed the triage vital signs and the nursing notes.  HISTORY  Chief Complaint Shortness of Breath   HPI Felicia Sellers is a 80 y.o. femalewho presents to the ED for evaluation of SOB.   Chart review indicates hx HTN, HLD, COPD, CHF, CKD3.  Lung cancer s/p lobectomy, right upper lobe?  Continues to smoke.  Does not wear oxygen at home.  Patient presents to the ED for evaluation of 24 hours of myalgias, shortness of breath, cough and generalized weakness.  She reports diffuse myalgias without any focal site of pain.  Denies chest pain.  Reports back spasming and requesting to take one of her home muscle relaxers.  Denies any falls, injury, syncopal episodes.  EMS was called to her residence, called by her nephew, and she was found to be hypoxic at 87% on room air.  Provided a DuoNeb and transferred to the ED on nasal cannula.   Past Medical History:  Diagnosis Date   Cancer [facility])    right lung removed - lung cancer 1/3 lung removed    CHF (congestive heart failure) ([facility])    COPD (chronic obstructive pulmonary disease) ([place])    Coronary artery disease    History of stomach ulcers    Hypertension    Pneumonia     Patient Active Problem List   Diagnosis Date Noted   Community acquired pneumonia 06/27/2020   Acute CHF (congestive heart failure) ([place]) 06/26/2020   Leukocytosis 06/26/2020   Hypoxia 06/26/2020   Thoracic aortic aneurysm without rupture ([place]) 08/16/2018   Kyphosis 08/07/2018   Hypertension 08/07/2018   Tobacco use 08/07/2018   Age-related osteoporosis without current pathological fracture 12/21/2016   Chronic hyponatremia 11/04/2015   Chronic low back pain 06/18/2015   Cardiomyopathy ([place]) 08/13/2014   Left bundle branch block (LBBB) 07/23/2014    Pedal edema 07/23/2014   SOB (shortness of breath) on exertion 07/23/2014    Past Surgical History:  Procedure Laterality Date   LUNG REMOVAL, PARTIAL Left     Prior to Admission medications   Medication Sig Start Date End Date Taking? Authorizing Provider  Albuterol Sulfate (PROAIR RESPICLICK) 123XX123 (90 Base) MCG/ACT AEPB Inhale 2 puffs into the lungs every 6 (six) hours as needed. 06/28/20   Danford, Suann Larry, MD  alendronate (FOSAMAX) 70 MG tablet Take 70 mg by mouth once a week. Take with a full glass of water on an empty stomach.    [provider]  amLODipine (NORVASC) 10 MG tablet Take 10 mg by mouth daily.    [provider]  atorvastatin (LIPITOR) 40 MG tablet Take 40 mg by mouth daily.    [provider]  calcium carbonate (OS-CAL - DOSED IN MG OF ELEMENTAL CALCIUM) 1250 (500 Ca) MG tablet Take 1 tablet by mouth 2 (two) times daily with a meal.     [provider]  Cholecalciferol 1000 units capsule Take 1,000 Units by mouth daily.    [provider]  docusate sodium (COLACE) 100 MG capsule Take 100 mg by mouth daily.     [provider]  gabapentin (NEURONTIN) 300 MG capsule Take 300 mg by mouth 3 (three) times daily.    [provider]  lisinopril (ZESTRIL) 10 MG tablet Take 10 mg by mouth daily.     [provider]  loperamide (IMODIUM) 2 MG capsule Take 4 mg by mouth as needed for diarrhea or loose stools.    [provider]  meloxicam (MOBIC) 7.5 MG tablet meloxicam 7.5 mg tablet  TK 1 T PO  D PRN    [provider]  metoprolol succinate (TOPROL-XL) 25 MG 24 hr tablet Take 25 mg by mouth daily.    [provider]  RABEprazole (ACIPHEX) 20 MG tablet Take 20 mg by mouth daily.  09/16/19 09/15/20  [provider]    Allergies Aspirin  Family History  Problem Relation Age of Onset   Breast cancer Neg Hx     Social History Social History   Tobacco Use    Smoking status: Every Day    Packs/day: 1.00    Years: 66.00    Pack years: 66.00    Types: Cigarettes    Last attempt to quit: 06/26/2020    Years since quitting: 1.0   Smokeless tobacco: Never  Vaping Use   Vaping Use: Never used  Substance Use Topics   Alcohol use: Not Currently    Comment: use to drink alot ( vodka) 6 months ago   Drug use: Never    Review of Systems  Constitutional: No fever/chills Eyes: No visual changes. ENT: No sore throat. Cardiovascular: Denies chest pain. Respiratory: Shortness of breath and cough Gastrointestinal: No abdominal pain.  No nausea, no vomiting.  No diarrhea.  No constipation. Genitourinary: Negative for dysuria. Musculoskeletal: Positive for atraumatic myalgias and shoulder pain Skin: Negative for rash. Neurological: Negative for headaches, focal weakness or numbness.  ____________________________________________   PHYSICAL EXAM:  VITAL SIGNS: Vitals:   07/08/21 2157  BP: (!) 114/58  Pulse: 72  Resp: (!) 22  Temp: 98.6 F (37 C)  SpO2: 95%     Constitutional: Alert and oriented.  Thin, pink puffer morphology.  Hard of hearing.  Frequently with a wet-sounding cough. Eyes: Conjunctivae are normal. PERRL. EOMI. Head: Atraumatic. Nose: No congestion/rhinnorhea. Mouth/Throat: Mucous membranes are moist.  Oropharynx non-erythematous. Neck: No stridor. No cervical spine tenderness to palpation. Cardiovascular: Normal rate, regular rhythm. Grossly normal heart sounds.  Good peripheral circulation. Respiratory: Tachypneic to the mid 20s without distress.  Rhonchorous breath sounds throughout the right side, and to a lesser degree the left base.  Mild wheezing throughout, and only slight decrease in air movement throughout. Gastrointestinal: Soft , nondistended, nontender to palpation. No CVA tenderness. Musculoskeletal: No lower extremity tenderness nor edema.  No joint effusions. No signs of acute trauma. Neurologic:  Normal  speech and language. No gross focal neurologic deficits are appreciated.  Skin:  Skin is warm, dry and intact. No rash noted. Psychiatric: Mood and affect are normal. Speech and behavior are normal.  ____________________________________________   LABS (all labs ordered are listed, but only abnormal results are displayed)  Labs Reviewed  CBC WITH DIFFERENTIAL/PLATELET - Abnormal; Notable for the following components:      Result Value   WBC 15.9 (*)    Neutro Abs 12.3 (*)    Abs Immature Granulocytes 0.09 (*)    All other components within normal limits  BRAIN NATRIURETIC PEPTIDE - Abnormal; Notable for the following components:   B Natriuretic Peptide 288.7 (*)    All other components within normal limits  RESP PANEL BY RT-PCR (FLU A&B, COVID) ARPGX2  CULTURE, BLOOD (SINGLE)  URINE CULTURE  URINALYSIS, COMPLETE (UACMP) WITH MICROSCOPIC  LACTIC ACID, PLASMA  LACTIC ACID, PLASMA  PROTIME-INR  APTT  COMPREHENSIVE METABOLIC PANEL  TROPONIN I (HIGH SENSITIVITY)   ____________________________________________  12 Lead EKG  Sinus rhythm with a rate of 72 bpm.  Normal axis.  Wandering baseline and poor quality EKG.  Left bundle.  No clear evidence of STEMI by Sgarbossa criteria. ____________________________________________  RADIOLOGY  ED MD interpretation: Right lung white out.  Surgical clips that appear to be to the right upper lobe.  Trachea midline.  Left lung fields with patchy opacities, primarily to the LUL.  Blebs are noted.  Official radiology report(s): DG Chest Portable 1 View  Result Date: 07/08/2021 CLINICAL DATA:  feeling short of breath today Best films possible due to pt condition. CHf/COPd exacerbation.e val pulm congestion/infiltrate EXAM: PORTABLE CHEST 1 VIEW COMPARISON:  Chest x-ray 06/26/2020 FINDINGS: The heart and mediastinal contours are grossly unchanged. Aortic calcification. Right lung pulmonary staples noted. Almost complete opacification of the right  hemithorax. Increased interstitial markings bilaterally. Likely some component of right pleural effusion. No left pleural effusion. No pneumothorax. No acute osseous abnormality. IMPRESSION: Almost complete opacification of the right hemithorax as well as increased interstitial markings bilaterally. Likely some component of right pleural effusion. Findings suggestive of infection/inflammation. Underlying malignancy not excluded. Recommend CT chest with intravenous contrast for further evaluation. Electronically Signed   By: Iven Finn M.D.   On: 07/08/2021 22:48    ____________________________________________   PROCEDURES and INTERVENTIONS  Procedure(s) performed (including Critical Care):  .1-3 Lead EKG Interpretation Performed by: Vladimir Crofts, MD Authorized by: Vladimir Crofts, MD     Interpretation: normal     ECG rate:  70   ECG rate assessment: normal     Rhythm: sinus rhythm     Ectopy: none     Conduction: normal   .Critical Care Performed by: Vladimir Crofts, MD Authorized by: Vladimir Crofts, MD   Critical care provider statement:    Critical care time (minutes):  45   Critical care was necessary to treat or prevent imminent or life-threatening deterioration of the following conditions:  Sepsis   Critical care was time spent personally by me on the following activities:  Discussions with consultants, evaluation of patient's response to treatment, examination of patient, ordering and performing treatments and interventions, ordering and review of laboratory studies, ordering and review of radiographic studies, pulse oximetry, re-evaluation of patient's condition, obtaining history from patient or surrogate and review of old charts  Medications  lactated ringers infusion (has no administration in time range)  lactated ringers bolus 1,000 mL (has no administration in time range)  cefTRIAXone (ROCEPHIN) 2 g in sodium chloride 0.9 % 100 mL IVPB (has no administration in time range)   azithromycin (ZITHROMAX) 500 mg in sodium chloride 0.9 % 250 mL IVPB (has no administration in time range)  methylPREDNISolone sodium succinate (SOLU-MEDROL) 125 mg/2 mL injection 125 mg (125 mg Intravenous Given 07/08/21 2232)  ipratropium-albuterol (DUONEB) 0.5-2.5 (3) MG/3ML nebulizer solution 3 mL (3 mLs Nebulization Given 07/08/21 2236)    ____________________________________________   MDM / ED COURSE   80 year old woman presents from home with complaints of just 1 day of shortness of breath, found to have evidence of sepsis and complete right lung white out, requiring antibiotics and medical admission.  She was hypoxic with EMS, though not hypoxic on room air with [place].  She is tachypneic though, but hemodynamically stable.  Blood work returning the time of signout, with evidence of leukocytosis.  Slight elevation of BNP.  X-ray with complete opacification of the right hemithorax.  Trachea is midline.  No evidence of  ACS.  Sepsis protocols initiated.  Patient signed out to oncoming provider to follow-up on remainder of blood work with expected medical admission.  Clinical Course as of 07/08/21 2321  Fri Jul 08, 2021  2235 Updated nurse on plan of care after seeing patient's CXR. [DS]    Clinical Course User Index [DS] Vladimir Crofts, MD    ____________________________________________   FINAL CLINICAL IMPRESSION(S) / ED DIAGNOSES  Final diagnoses:  Sepsis with acute hypoxic respiratory failure without septic shock, due to unspecified organism [facility])     ED Discharge Orders     None        Rudi Knippenberg   Note:  This document was prepared using Dragon voice recognition software and may include unintentional dictation errors.    Vladimir Crofts, MD 07/08/21 2322

## 2021-07-09 ENCOUNTER — Inpatient Hospital Stay
Admit: 2021-07-09 | Discharge: 2021-07-09 | Disposition: A | Discharge status: Home / Self Care | Payer: Medicare Other | Attending: Internal Medicine | Admitting: Internal Medicine

## 2021-07-09 ENCOUNTER — Emergency Department: Payer: Medicare Other

## 2021-07-09 DIAGNOSIS — C7801 Secondary malignant neoplasm of right lung: Secondary | ICD-10-CM | POA: Diagnosis present

## 2021-07-09 DIAGNOSIS — I130 Hypertensive heart and chronic kidney disease with heart failure and stage 1 through stage 4 chronic kidney disease, or unspecified chronic kidney disease: Secondary | ICD-10-CM | POA: Diagnosis present

## 2021-07-09 DIAGNOSIS — I480 Paroxysmal atrial fibrillation: Secondary | ICD-10-CM | POA: Diagnosis present

## 2021-07-09 DIAGNOSIS — J439 Emphysema, unspecified: Secondary | ICD-10-CM | POA: Diagnosis present

## 2021-07-09 DIAGNOSIS — E871 Hypo-osmolality and hyponatremia: Secondary | ICD-10-CM | POA: Diagnosis present

## 2021-07-09 DIAGNOSIS — F17210 Nicotine dependence, cigarettes, uncomplicated: Secondary | ICD-10-CM | POA: Diagnosis present

## 2021-07-09 DIAGNOSIS — F0390 Unspecified dementia without behavioral disturbance: Secondary | ICD-10-CM | POA: Diagnosis present

## 2021-07-09 DIAGNOSIS — Z902 Acquired absence of lung [part of]: Secondary | ICD-10-CM | POA: Diagnosis not present

## 2021-07-09 DIAGNOSIS — I4892 Unspecified atrial flutter: Secondary | ICD-10-CM | POA: Diagnosis present

## 2021-07-09 DIAGNOSIS — J189 Pneumonia, unspecified organism: Secondary | ICD-10-CM | POA: Diagnosis present

## 2021-07-09 DIAGNOSIS — J9601 Acute respiratory failure with hypoxia: Secondary | ICD-10-CM | POA: Diagnosis present

## 2021-07-09 DIAGNOSIS — G9341 Metabolic encephalopathy: Secondary | ICD-10-CM | POA: Diagnosis present

## 2021-07-09 DIAGNOSIS — Z79899 Other long term (current) drug therapy: Secondary | ICD-10-CM | POA: Diagnosis not present

## 2021-07-09 DIAGNOSIS — Z20822 Contact with and (suspected) exposure to covid-19: Secondary | ICD-10-CM | POA: Diagnosis present

## 2021-07-09 DIAGNOSIS — I2510 Atherosclerotic heart disease of native coronary artery without angina pectoris: Secondary | ICD-10-CM | POA: Diagnosis present

## 2021-07-09 DIAGNOSIS — I5032 Chronic diastolic (congestive) heart failure: Secondary | ICD-10-CM | POA: Diagnosis present

## 2021-07-09 DIAGNOSIS — E876 Hypokalemia: Secondary | ICD-10-CM | POA: Diagnosis present

## 2021-07-09 DIAGNOSIS — N183 Chronic kidney disease, stage 3 unspecified: Secondary | ICD-10-CM | POA: Diagnosis present

## 2021-07-09 DIAGNOSIS — I428 Other cardiomyopathies: Secondary | ICD-10-CM | POA: Diagnosis present

## 2021-07-09 DIAGNOSIS — J9621 Acute and chronic respiratory failure with hypoxia: Secondary | ICD-10-CM

## 2021-07-09 DIAGNOSIS — Z7983 Long term (current) use of bisphosphonates: Secondary | ICD-10-CM | POA: Diagnosis not present

## 2021-07-09 DIAGNOSIS — Z85118 Personal history of other malignant neoplasm of bronchus and lung: Secondary | ICD-10-CM | POA: Diagnosis not present

## 2021-07-09 DIAGNOSIS — E785 Hyperlipidemia, unspecified: Secondary | ICD-10-CM | POA: Diagnosis present

## 2021-07-09 LAB — URINALYSIS, COMPLETE (UACMP) WITH MICROSCOPIC
Bilirubin Urine: NEGATIVE
Glucose, UA: NEGATIVE mg/dL
Hgb urine dipstick: NEGATIVE
Ketones, ur: NEGATIVE mg/dL
Leukocytes,Ua: NEGATIVE
Nitrite: NEGATIVE
Protein, ur: NEGATIVE mg/dL
Specific Gravity, Urine: 1.01 (ref 1.005–1.030)
pH: 5.5 (ref 5.0–8.0)

## 2021-07-09 LAB — PROCALCITONIN: Procalcitonin: <0.1 ng/mL

## 2021-07-09 LAB — CBC
HCT: 39.2 % (ref 36.0–46.0)
Hemoglobin: 14.1 g/dL (ref 12.0–15.0)
MCH: 33.3 pg (ref 26.0–34.0)
MCHC: 36 g/dL (ref 30.0–36.0)
MCV: 92.7 fL (ref 80.0–100.0)
Platelets: 267 10*3/uL (ref 150–400)
RBC: 4.23 MIL/uL (ref 3.87–5.11)
RDW: 13.2 % (ref 11.5–15.5)
WBC: 13.5 10*3/uL — ABNORMAL HIGH (ref 4.0–10.5)
nRBC: 0 % (ref 0.0–0.2)

## 2021-07-09 LAB — COMPREHENSIVE METABOLIC PANEL
ALT: 10 U/L (ref 0–44)
AST: 14 U/L — ABNORMAL LOW (ref 15–41)
Albumin: 3.7 g/dL (ref 3.5–5.0)
Alkaline Phosphatase: 118 U/L (ref 38–126)
Anion gap: 13 (ref 5–15)
BUN: 12 mg/dL (ref 8–23)
CO2: 24 mmol/L (ref 22–32)
Calcium: 8.6 mg/dL — ABNORMAL LOW (ref 8.9–10.3)
Chloride: 92 mmol/L — ABNORMAL LOW (ref 98–111)
Creatinine, Ser: 0.75 mg/dL (ref 0.44–1.00)
GFR, Estimated: >60 mL/min (ref >60)
Glucose, Bld: 100 mg/dL — ABNORMAL HIGH (ref 70–99)
Potassium: 3.7 mmol/L (ref 3.5–5.1)
Sodium: 129 mmol/L — ABNORMAL LOW (ref 135–145)
Total Bilirubin: 1.3 mg/dL — ABNORMAL HIGH (ref 0.3–1.2)
Total Protein: 7.1 g/dL (ref 6.5–8.1)

## 2021-07-09 LAB — ECHOCARDIOGRAM COMPLETE
AR max vel: 1.93 cm2
AV Peak grad: 7.1 mmHg
Ao pk vel: 1.33 m/s
Area-P 1/2: 6.83 cm2
Height: 61 in
S' Lateral: 2.9 cm
Weight: 1856 oz

## 2021-07-09 LAB — LACTIC ACID, PLASMA: Lactic Acid, Venous: 1.3 mmol/L (ref 0.5–1.9)

## 2021-07-09 LAB — TROPONIN I (HIGH SENSITIVITY): Troponin I (High Sensitivity): 8 ng/L (ref <18)

## 2021-07-09 MED ORDER — ONDANSETRON HCL 4 MG/2ML IJ SOLN: 4.0000 mg | Freq: Four times a day (QID) | INTRAMUSCULAR | Status: DC | PRN

## 2021-07-09 MED ORDER — SODIUM CHLORIDE 0.9 % IV SOLN
500.0000 mg | INTRAVENOUS | Status: AC
  Administered 2021-07-10 (×2): 500 mg via INTRAVENOUS
  Filled 2021-07-09 (×2): qty 500

## 2021-07-09 MED ORDER — ATORVASTATIN CALCIUM 20 MG PO TABS
40.0000 mg | ORAL_TABLET | Freq: Every day | ORAL | Status: DC
  Administered 2021-07-09 – 2021-07-13 (×5): 40 mg via ORAL
  Filled 2021-07-09 (×5): qty 2

## 2021-07-09 MED ORDER — METOPROLOL TARTRATE 25 MG PO TABS
25.0000 mg | ORAL_TABLET | Freq: Two times a day (BID) | ORAL | Status: DC
  Administered 2021-07-10: 25 mg via ORAL
  Filled 2021-07-09: qty 1

## 2021-07-09 MED ORDER — FUROSEMIDE 10 MG/ML IJ SOLN
40.0000 mg | Freq: Once | INTRAMUSCULAR | Status: AC
  Administered 2021-07-09: 40 mg via INTRAVENOUS
  Filled 2021-07-09: qty 4

## 2021-07-09 MED ORDER — METOPROLOL SUCCINATE ER 50 MG PO TB24
25.0000 mg | ORAL_TABLET | Freq: Every day | ORAL | Status: DC
  Administered 2021-07-09: 25 mg via ORAL
  Filled 2021-07-09: qty 1

## 2021-07-09 MED ORDER — SODIUM CHLORIDE 0.9 % IV SOLN: 2.0000 g | INTRAVENOUS | Status: DC

## 2021-07-09 MED ORDER — DOCUSATE SODIUM 100 MG PO CAPS
100.0000 mg | ORAL_CAPSULE | Freq: Every day | ORAL | Status: DC
  Administered 2021-07-09 – 2021-07-11 (×3): 100 mg via ORAL
  Filled 2021-07-09 (×4): qty 1

## 2021-07-09 MED ORDER — ONDANSETRON HCL 4 MG PO TABS: 4.0000 mg | ORAL_TABLET | Freq: Four times a day (QID) | ORAL | Status: DC | PRN

## 2021-07-09 MED ORDER — ACETAMINOPHEN 325 MG PO TABS: 650.0000 mg | ORAL_TABLET | Freq: Four times a day (QID) | ORAL | Status: DC | PRN

## 2021-07-09 MED ORDER — AZITHROMYCIN 500 MG IV SOLR: 500.0000 mg | INTRAVENOUS | Status: DC

## 2021-07-09 MED ORDER — ENOXAPARIN SODIUM 40 MG/0.4ML IJ SOSY
40.0000 mg | PREFILLED_SYRINGE | INTRAMUSCULAR | Status: DC
  Administered 2021-07-09 – 2021-07-13 (×5): 40 mg via SUBCUTANEOUS
  Filled 2021-07-09 (×5): qty 0.4

## 2021-07-09 MED ORDER — ACETAMINOPHEN 650 MG RE SUPP: 650.0000 mg | Freq: Four times a day (QID) | RECTAL | Status: DC | PRN

## 2021-07-09 MED ORDER — IPRATROPIUM-ALBUTEROL 0.5-2.5 (3) MG/3ML IN SOLN: 3.0000 mL | RESPIRATORY_TRACT | Status: DC | PRN

## 2021-07-09 MED ORDER — FUROSEMIDE 20 MG PO TABS
20.0000 mg | ORAL_TABLET | Freq: Every day | ORAL | Status: DC
  Administered 2021-07-09 – 2021-07-11 (×3): 20 mg via ORAL
  Filled 2021-07-09 (×3): qty 1

## 2021-07-09 MED ORDER — FUROSEMIDE 10 MG/ML IJ SOLN
20.0000 mg | Freq: Two times a day (BID) | INTRAMUSCULAR | Status: DC
  Administered 2021-07-09: 20 mg via INTRAVENOUS
  Filled 2021-07-09: qty 4

## 2021-07-09 MED ORDER — PREDNISONE 20 MG PO TABS
40.0000 mg | ORAL_TABLET | Freq: Every day | ORAL | Status: DC
  Administered 2021-07-09 – 2021-07-11 (×3): 40 mg via ORAL
  Filled 2021-07-09 (×3): qty 2

## 2021-07-09 MED ORDER — METOPROLOL TARTRATE 25 MG PO TABS
25.0000 mg | ORAL_TABLET | Freq: Once | ORAL | Status: AC
  Administered 2021-07-09: 25 mg via ORAL
  Filled 2021-07-09: qty 1

## 2021-07-09 NOTE — Progress Notes (Signed)
PROGRESS NOTE    Felicia Sellers  [id number] DOB: 13-Feb-1941 DOA: 07/08/2021 PCP: [facility]  Outpatient Specialists: oncology, cardiology    Brief Narrative:  From admission hpi: Felicia Sellers is a 80 y.o. female with medical history significant for Right lung cancer status post lobectomy, HTN, COPD, CHF, CAD, chronic hyponatremia who presents to the ED with shortness of breath, cough, muscle aches and generalized weakness.  She denied fever or chills.  She was brought in by EMS who recorded O2 sat of 87 on room air and she was placed on O2 via nasal cannula and treated with DuoNeb in route.  History taken from ER provider as patient was lethargic by the time of my assessment and unable to contribute to history   Assessment & Plan:   Principal Problem:   Acute respiratory failure with hypoxia ([place]) Active Problems:   Chronic hyponatremia   Hypertension   Community acquired pneumonia   Coronary artery disease involving native coronary artery of native heart without angina pectoris   COPD, moderate ([place])   Personal history of lung cancer   CAP (community acquired pneumonia)    Acute respiratory failure with hypoxia ([place]) O2 sat was 87% on room air with EMS with no prior history of home O2 use. Suspect related to postobstructive pneumonia with some element of heart failure exacerbation.  - Continue O2 to keep sats over 90 and wean as tolerated - Treat etiology   Postobstructive pneumonia Personal history of right lung cancer s/p lobectomy History copd Patient with cough and shortness of breath and myalgias and leukocytosis of 16,00. CT chest without contrast collapse and consolidation of the right lung with suspicion of obstructing lesion involving the right bronchus intermedius.  This is likely to represent primary lung cancer.  Mediastinal lymphadenopathy likely metastatic, underlying emphysematous and chronic bronchitic changes. - Continue Rocephin and  azithromycin for now, check procalcitonin - add prednisone given hx copd - Mucolytic's - Supplemental O2 - pulmonology consult for consideration of bronchoscopy   Chronic diastolic heart failure Bnp moderately elevated to 200s. Received lasix in ed. May be slightly fluid overloaded - cont metop - add lasix 20 - f/u TTE -Daily weights with intake and output monitoring.     Acute metabolic encephalopathy Dementia Underlying dementia per nephew current mental status not far off from baseline, hypoxia and acute illness likely contributing - treat underlying problem(s) - hold gabapentin   Chronic hyponatremia Likely 2/2 pulmonary process. Sodium 129.  Baseline 129-132 - Continue to monitor    Hypertension Here bp low normal - hold home amlod and lisin   History NICM - Continue atorvastatin and metoprolol - Patient denied chest pain and EKG nonacute.  Troponin 14/8       COPD, moderate ([facility]) - DuoNebs as needed   DVT prophylaxis: lovenox Code Status: full Family Communication: friend (listed as nephew but not a blood relation; she lives with him) updated telephonically 9/17. He says she has no immediate family  Level of care: Med-Surg Status is: Inpatient  Remains inpatient appropriate because:Inpatient level of care appropriate due to severity of illness  Dispo: The patient is from: Home              Anticipated d/c is to: tbd. Pt/ot consulted              Patient currently is not medically stable to d/c.   Difficult to place patient No        Consultants:  pulmonology  Procedures:  none  Antimicrobials:  Azithromycin/ceftriaxone 9/16>    Subjective: This morning complains of continued cough. No chest pain. No n/v/d.  Objective: Vitals:   07/09/21 0510 07/09/21 0530 07/09/21 0600 07/09/21 0630  BP:   129/67   Pulse: 69 68 71 70  Resp: '19 17 17 18  '$ Temp:      TempSrc:      SpO2: 98% 100% 100% 100%  Weight:      Height:       No intake or  output data in the 24 hours ending 07/09/21 0755 Filed Weights   07/08/21 2159  Weight: 52.6 kg    Examination:  General exam: Appears calm and comfortable. Chronically ill appearing Respiratory system: diffuse scattered rhonchi, decreased breath sounds in bases Cardiovascular system: S1 & S2 heard, RRR. No JVD, murmurs, rubs, gallops or clicks. No pedal edema. Gastrointestinal system: Abdomen is nondistended, soft and nontender. No organomegaly or masses felt. Normal bowel sounds heard. Central nervous system: Alert, confused. Moving all 4 extremities Extremities: Symmetric 5 x 5 power. Skin: No rashes, lesions or ulcers Psychiatry: calm, confused    Data Reviewed: I have personally reviewed following labs and imaging studies  CBC: Recent Labs  Lab 07/08/21 2206  WBC 15.9*  NEUTROABS 12.3*  HGB 13.4  HCT 39.5  MCV 92.5  PLT [id number]   Basic Metabolic Panel: Recent Labs  Lab 07/09/21 0017  NA 129*  K 3.7  CL 92*  CO2 24  GLUCOSE 100*  BUN 12  CREATININE 0.75  CALCIUM 8.6*   GFR: Estimated Creatinine Clearance: 40.9 mL/min (by C-G formula based on SCr of 0.75 mg/dL). Liver Function Tests: Recent Labs  Lab 07/09/21 0017  AST 14*  ALT 10  ALKPHOS 118  BILITOT 1.3*  PROT 7.1  ALBUMIN 3.7   No results for input(s): LIPASE, AMYLASE in the last 168 hours. No results for input(s): AMMONIA in the last 168 hours. Coagulation Profile: Recent Labs  Lab 07/08/21 2302  INR 1.1   Cardiac Enzymes: No results for input(s): CKTOTAL, CKMB, CKMBINDEX, TROPONINI in the last 168 hours. BNP (last 3 results) No results for input(s): PROBNP in the last 8760 hours. HbA1C: No results for input(s): HGBA1C in the last 72 hours. CBG: No results for input(s): GLUCAP in the last 168 hours. Lipid Profile: No results for input(s): CHOL, HDL, LDLCALC, TRIG, CHOLHDL, LDLDIRECT in the last 72 hours. Thyroid Function Tests: No results for input(s): TSH, T4TOTAL, FREET4, T3FREE,  THYROIDAB in the last 72 hours. Anemia Panel: No results for input(s): VITAMINB12, FOLATE, FERRITIN, TIBC, IRON, RETICCTPCT in the last 72 hours. Urine analysis:    Component Value Date/Time   COLORURINE STRAW (A) 06/26/2020 2109   APPEARANCEUR CLEAR (A) 06/26/2020 2109   APPEARANCEUR Clear 05/28/2014 0855   LABSPEC 1.009 06/26/2020 2109   LABSPEC 1.013 05/28/2014 0855   PHURINE 6.0 06/26/2020 2109   GLUCOSEU NEGATIVE 06/26/2020 2109   GLUCOSEU Negative 05/28/2014 0855   HGBUR NEGATIVE 06/26/2020 2109   BILIRUBINUR NEGATIVE 06/26/2020 2109   BILIRUBINUR Negative 05/28/2014 0855   KETONESUR NEGATIVE 06/26/2020 2109   PROTEINUR NEGATIVE 06/26/2020 2109   NITRITE NEGATIVE 06/26/2020 2109   LEUKOCYTESUR NEGATIVE 06/26/2020 2109   LEUKOCYTESUR Negative 05/28/2014 0855   Sepsis Labs: '@LABRCNTIP'$ (procalcitonin:4,lacticidven:4)  ) Recent Results (from the past 240 hour(s))  Resp Panel by RT-PCR (Flu A&B, Covid) Nasopharyngeal Swab     Status: None   Collection Time: 07/08/21 10:27 PM   Specimen: Nasopharyngeal Swab; Nasopharyngeal(NP) swabs in vial transport  medium  Result Value Ref Range Status   SARS Coronavirus 2 by RT PCR NEGATIVE NEGATIVE Final    Comment: (NOTE) SARS-CoV-2 target nucleic acids are NOT DETECTED.  The SARS-CoV-2 RNA is generally detectable in upper respiratory specimens during the acute phase of infection. The lowest concentration of SARS-CoV-2 viral copies this assay can detect is 138 copies/mL. A negative result does not preclude SARS-Cov-2 infection and should not be used as the sole basis for treatment or other patient management decisions. A negative result may occur with  improper specimen collection/handling, submission of specimen other than nasopharyngeal swab, presence of viral mutation(s) within the areas targeted by this assay, and inadequate number of viral copies(<138 copies/mL). A negative result must be combined with clinical observations,  patient history, and epidemiological information. The expected result is Negative.  Fact Sheet for Patients:  EntrepreneurPulse.com.au  Fact Sheet for Healthcare Providers:  IncredibleEmployment.be  This test is no t yet approved or cleared by the [place] FDA and  has been authorized for detection and/or diagnosis of SARS-CoV-2 by FDA under an Emergency Use Authorization (EUA). This EUA will remain  in effect (meaning this test can be used) for the duration of the COVID-19 declaration under Section 564(b)(1) of the Act, 21 U.S.C.section 360bbb-3(b)(1), unless the authorization is terminated  or revoked sooner.       Influenza A by PCR NEGATIVE NEGATIVE Final   Influenza B by PCR NEGATIVE NEGATIVE Final    Comment: (NOTE) The Xpert Xpress SARS-CoV-2/FLU/RSV plus assay is intended as an aid in the diagnosis of influenza from Nasopharyngeal swab specimens and should not be used as a sole basis for treatment. Nasal washings and aspirates are unacceptable for Xpert Xpress SARS-CoV-2/FLU/RSV testing.  Fact Sheet for Patients: EntrepreneurPulse.com.au  Fact Sheet for Healthcare Providers: IncredibleEmployment.be  This test is not yet approved or cleared by the [place] FDA and has been authorized for detection and/or diagnosis of SARS-CoV-2 by FDA under an Emergency Use Authorization (EUA). This EUA will remain in effect (meaning this test can be used) for the duration of the COVID-19 declaration under Section 564(b)(1) of the Act, 21 U.S.C. section 360bbb-3(b)(1), unless the authorization is terminated or revoked.  Performed at [facility], [address] [place], [place] [address]   Blood culture (routine single)     Status: None (Preliminary result)   Collection Time: 07/08/21 11:04 PM   Specimen: BLOOD  Result Value Ref Range Status   Specimen Description BLOOD LEFT ANTECUBITAL   Final   Special Requests   Final    BOTTLES DRAWN AEROBIC AND ANAEROBIC Blood Culture adequate volume   Culture   Final    NO GROWTH < 12 HOURS Performed at [facility], [address], [place], [place] [address]    Report Status PENDING  Incomplete         Radiology Studies: CT CHEST WO CONTRAST  Result Date: 07/09/2021 CLINICAL DATA:  Abnormal x-ray with pleural effusion. Shortness of breath today. EXAM: CT CHEST WITHOUT CONTRAST TECHNIQUE: Multidetector CT imaging of the chest was performed following the standard protocol without IV contrast. COMPARISON:  06/26/2020.  Chest radiograph 07/08/2021 FINDINGS: Cardiovascular: Mild cardiac enlargement. No pericardial effusion. Calcification in the mitral valve annulus, coronary arteries, and aorta. 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 Aneurysm of the lower descending thoracic aorta with maximal AP diameter of about 5.5 cm. Appearance is similar to previous study. Mediastinum/Nodes: Thyroid gland is unremarkable. Moderately  prominent mediastinal lymph nodes with pretracheal nodes measuring up to about 1.8 cm short axis dimension. Esophagus is decompressed. Lungs/Pleura: Small right pleural effusion. Significant volume loss in the right lung with heterogeneous consolidation and air bronchograms throughout the right lung but most prominent in the right middle and right lower lungs. Minimal residual aeration in the right upper lung. There is a cut off sign to the right bronchus intermedius which may indicate obstructing mass or endobronchial process. The left lung demonstrates diffuse emphysematous changes with peripheral fibrosis and multiple pulmonary nodules which have developed since the prior study. Overall, changes are likely to represent a right hilar or endobronchial mass causing postobstructive change on the right and metastatic disease throughout both lungs. Upper Abdomen:  Prominent vascular calcifications. No acute process identified in the visualized upper abdomen. Musculoskeletal: Degenerative changes in the spine. No destructive bone lesions. IMPRESSION: 1. Collapse and consolidation of the right lung with suspicion of obstructing lesion involving the right bronchus intermedius. This is likely to represent primary lung cancer. 2. Diffuse pulmonary nodule seen best in the aerated left lung, likely metastatic disease. 3. Mediastinal lymphadenopathy is likely metastatic. 4. Underlying emphysematous and chronic bronchitic changes in the lungs with slight peripheral fibrosis. 5. 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 6. Diffuse aortic atherosclerosis. Electronically Signed   By: Lucienne Capers M.D.   On: 07/09/2021 01:23   DG Chest Portable 1 View  Result Date: 07/08/2021 CLINICAL DATA:  feeling short of breath today Best films possible due to pt condition. CHf/COPd exacerbation.e val pulm congestion/infiltrate EXAM: PORTABLE CHEST 1 VIEW COMPARISON:  Chest x-ray 06/26/2020 FINDINGS: The heart and mediastinal contours are grossly unchanged. Aortic calcification. Right lung pulmonary staples noted. Almost complete opacification of the right hemithorax. Increased interstitial markings bilaterally. Likely some component of right pleural effusion. No left pleural effusion. No pneumothorax. No acute osseous abnormality. IMPRESSION: Almost complete opacification of the right hemithorax as well as increased interstitial markings bilaterally. Likely some component of right pleural effusion. Findings suggestive of infection/inflammation. Underlying malignancy not excluded. Recommend CT chest with intravenous contrast for further evaluation. Electronically Signed   By: Iven Finn M.D.   On: 07/08/2021 22:48        Scheduled Meds:  enoxaparin (LOVENOX) injection  40 mg Subcutaneous Q24H   furosemide  20 mg Intravenous BID   Continuous Infusions:   azithromycin Stopped (07/09/21 0139)   cefTRIAXone (ROCEPHIN)  IV Stopped (07/08/21 2341)   lactated ringers Stopped (07/09/21 0010)     LOS: 0 days    Time spent: 37 min    Desma Maxim, MD [facility]   If 7PM-7AM, please contact night-coverage [email or web address] Password [facility] 07/09/2021, 7:55 AM

## 2021-07-09 NOTE — Progress Notes (Signed)
*  PRELIMINARY RESULTS* Echocardiogram 2D Echocardiogram has been performed.  Felicia Sellers 07/09/2021, 12:46 PM

## 2021-07-09 NOTE — ED Notes (Signed)
Pure wick placed, pt states she needs to urinate, pt encouraged to urinate, suction at 32mhg forpurewick.

## 2021-07-09 NOTE — ED Notes (Signed)
Lab notified of need for venipuncture assist.  

## 2021-07-09 NOTE — ED Notes (Signed)
Pt repositioned in bed to improve oxygenation. Pt arousable to voice, denies pain currently, call bell at left side.

## 2021-07-09 NOTE — Progress Notes (Signed)
CODE SEPSIS - PHARMACY COMMUNICATION  **Broad Spectrum Antibiotics should be administered within 1 hour of Sepsis diagnosis**  Time Code Sepsis Called/Page Received: 9/16 @ 2234  Antibiotics Ordered: Azithromycin, Ceftriaxone   Time of 1st antibiotic administration: Ceftriaxone 2 gm IV X 1 on 9/16 @ 2335   Additional action taken by pharmacy: messaged RN @ ~ 2220 to remind to give abx   If necessary, Name of Provider/Nurse Contacted:  Gildardo Pounds D ,PharmD Clinical Pharmacist  07/09/2021  12:11 AM

## 2021-07-09 NOTE — ED Notes (Signed)
Report from [place], rn.

## 2021-07-09 NOTE — ED Notes (Signed)
RN to bedside to introduce self. Pt sleeping.

## 2021-07-09 NOTE — Consult Note (Signed)
Pulmonary Medicine          Date: 07/09/2021,   MRN# [id number] Felicia Sellers 01/03/1941     AdmissionWeight: 52.6 kg                 CurrentWeight: 52.6 kg      CHIEF COMPLAINT:   Request for bronch and respiratory failure.    HISTORY OF PRESENT ILLNESS   This is an 80 yr old lady came in in respiratory failure ( hypoxia). She is known to have had a diagnosis of lung cancer, status post upper lobectomy in 2004. On w/u she is noted to have on chest ct:   1. Collapse and consolidation of the right lung with suspicion of obstructing lesion involving the right bronchus intermedius. This is likely to represent primary lung cancer. 2. Diffuse pulmonary nodule seen best in the aerated left lung, likely metastatic disease. 3. Mediastinal lymphadenopathy is likely metastatic. 4. Underlying emphysematous and chronic bronchitic changes in the lungs with slight peripheral fibrosis. 5. 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 6. Diffuse aortic atherosclerosis.  IMPRESSION: chest angio earlier No evidence of pulmonary embolus.   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   Patchy airspace disease within the right lower lobe concerning for pneumonia. Recommend follow-up after treatment to ensure resolution.   Cardiomegaly, coronary artery disease.   Aortic Atherosclerosis (ICD10-I70.0) and Emphysema (ICD10-J43.9).    Electronically Signed   By: Rolm Baptise M.D.   On: 06/27/2020 00:11  COMPARISON:  PET scan of August 22, 2018. CT scan of July 24, 2018.   FINDINGS: Cardiovascular: Severe atherosclerosis of thoracic aorta is noted with tortuosity of the proximal descending thoracic aorta. Proximal descending thoracic aorta measures 3.8 cm in diameter. Distal portion of transverse aortic arch measures 3.9 cm. 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 Severe stenosis is noted at the origin of  the left common carotid artery. No pericardial effusion is noted. Coronary artery calcifications are noted.   Mediastinum/Nodes: Small right thyroid nodules are noted. Stable mediastinal adenopathy is noted as described on prior PET scan. Esophagus is unremarkable.   Lungs/Pleura: No pneumothorax or pleural effusion is noted. Mild emphysematous disease is noted in both upper lobes. Mild scarring is seen throughout both lungs. No consolidative process is noted. Stable 4 mm nodule is noted in right lower lobe best seen on image number 28 of series 5.   Upper Abdomen: No acute abnormality.   Musculoskeletal: No chest wall abnormality. No acute or significant osseous findings.   Review of the MIP images confirms the above findings.   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 Recommend semi-annual imaging followup by CTA or MRA and referral to cardiothoracic surgery if not already obtained. This recommendation follows 2010 ACCF/AHA/AATS/ACR/ASA/SCA/SCAI/SIR/STS/SVM Guidelines for the Diagnosis and Management of Patients With Thoracic Aortic Disease. Circulation. 2010; [id number].   Severe stenosis noted at origin of right common carotid artery.   Stable mediastinal adenopathy is noted compared to prior PET scan.   Coronary artery calcifications are noted.   Stable 4 mm nodule is noted in right lower lobe compared to prior PET scan.   Aortic Atherosclerosis (ICD10-I70.0) and Emphysema (ICD10-J43.9).     Electronically Signed   By: Marijo Conception, M.D.   On: 09/17/2018 17:14    IMPRESSION: chest 2013 1. 10 x 12 mm right upper lobe nodule once again noted, with surrounding  parenchymal spiculation. This is  highly suspicious for primary lung  neoplasm.  2. Interval development of 4 x 5 mm nodule within the superior segment of  the right lower lobe.  3. Nonspecific pretracheal lymph nodes, maximally 12 x 18 mm. Malignancy  not excludable. PET scanning may  be useful.  BUNDLE: <BUNDLE_DOC>  Read By: Charmaine Downs M.D.  Transcribed By: [facility]  Electronically Signed By: Charmaine Downs M.D. Specimen Collected: -- Last Resulted: --  Date: 10/04/12    PAST MEDICAL HISTORY   Past Medical History:  Diagnosis Date   Cancer ([place])    right lung removed - lung cancer 1/3 lung removed    CHF (congestive heart failure) (H[facility])    COPD (chronic obstructive pulmonary disease) (H[facility])    Coronary artery disease    History of stomach ulcers    Hypertension    Pneumonia      SURGICAL HISTORY   Past Surgical History:  Procedure Laterality Date   LUNG REMOVAL, PARTIAL Left      FAMILY HISTORY   Family History  Problem Relation Age of Onset   Breast cancer Neg Hx      SOCIAL HISTORY   Social History   Tobacco Use   Smoking status: Every Day    Packs/day: 1.00    Years: 66.00    Pack years: 66.00    Types: Cigarettes    Last attempt to quit: 06/26/2020    Years since quitting: 1.0   Smokeless tobacco: Never  Vaping Use   Vaping Use: Never used  Substance Use Topics   Alcohol use: Not Currently    Comment: use to drink alot ( vodka) 6 months ago   Drug use: Never     MEDICATIONS    Home Medication:  Current Outpatient Rx   Order #: [id number] Class: Normal   Order #: [id number] Class: Historical Med   Order #: [id number] Class: Historical Med   Order #: [id number] Class: Historical Med   Order #: [id number] Class: Historical Med   Order #: [id number] Class: Historical Med   Order #: [id number] Class: Historical Med   Order #: [id number] Class: Historical Med   Order #: [id number] Class: Historical Med   Order #: [id number] Class: Historical Med   Order #: [id number] Class: Historical Med   Order #: [id number] Class: Historical Med   Order #: [id number] Class: Historical Med    Current Medication:  Current Facility-Administered Medications:    acetaminophen (TYLENOL) tablet 650 mg, 650 mg, Oral, Q6H PRN **OR** acetaminophen (TYLENOL) suppository  650 mg, 650 mg, Rectal, Q6H PRN, Athena Masse, MD   atorvastatin (LIPITOR) tablet 40 mg, 40 mg, Oral, Daily, Wouk, Ailene Rud, MD, 40 mg at 07/09/21 1051   azithromycin (ZITHROMAX) 500 mg in sodium chloride 0.9 % 250 mL IVPB, 500 mg, Intravenous, Q24H, Wouk, Ailene Rud, MD   cefTRIAXone (ROCEPHIN) 2 g in sodium chloride 0.9 % 100 mL IVPB, 2 g, Intravenous, Q24H, Vladimir Crofts, MD, Stopped at 07/08/21 2341   docusate sodium (COLACE) capsule 100 mg, 100 mg, Oral, Daily, Wouk, Ailene Rud, MD, 100 mg at 07/09/21 1051   enoxaparin (LOVENOX) injection 40 mg, 40 mg, Subcutaneous, Q24H, Judd Gaudier V, MD, 40 mg at 07/09/21 [id number]   furosemide (LASIX) tablet 20 mg, 20 mg, Oral, Daily, Wouk, Ailene Rud, MD, 20 mg at 07/09/21 1051   ipratropium-albuterol (DUONEB) 0.5-2.5 (3) MG/3ML nebulizer solution 3 mL, 3 mL, Nebulization, Q4H PRN, Athena Masse, MD   metoprolol succinate (TOPROL-XL) 24 hr tablet 25 mg, 25 mg, Oral, Daily, Wouk, Ailene Rud, MD, 25 mg at  07/09/21 1051   ondansetron (ZOFRAN) tablet 4 mg, 4 mg, Oral, Q6H PRN **OR** ondansetron (ZOFRAN) injection 4 mg, 4 mg, Intravenous, Q6H PRN, Athena Masse, MD   predniSONE (DELTASONE) tablet 40 mg, 40 mg, Oral, Q breakfast, Wouk, Ailene Rud, MD, 40 mg at 07/09/21 1051  Current Outpatient Medications:    Albuterol Sulfate (PROAIR RESPICLICK) [id number] (90 Base) MCG/ACT AEPB, Inhale 2 puffs into the lungs every 6 (six) hours as needed., Disp: 1 each, Rfl: 11   alendronate (FOSAMAX) 70 MG tablet, Take 70 mg by mouth once a week. Take with a full glass of water on an empty stomach., Disp: , Rfl:    amLODipine (NORVASC) 10 MG tablet, Take 10 mg by mouth daily., Disp: , Rfl:    atorvastatin (LIPITOR) 40 MG tablet, Take 40 mg by mouth daily., Disp: , Rfl:    calcium carbonate (OS-CAL - DOSED IN MG OF ELEMENTAL CALCIUM) 1250 (500 Ca) MG tablet, Take 1 tablet by mouth 2 (two) times daily with a meal. , Disp: , Rfl:    Cholecalciferol 1000 units capsule,  Take 1,000 Units by mouth daily., Disp: , Rfl:    docusate sodium (COLACE) 100 MG capsule, Take 100 mg by mouth daily. , Disp: , Rfl:    gabapentin (NEURONTIN) 300 MG capsule, Take 300 mg by mouth 3 (three) times daily., Disp: , Rfl:    lisinopril (ZESTRIL) 10 MG tablet, Take 10 mg by mouth daily. , Disp: , Rfl:    loperamide (IMODIUM) 2 MG capsule, Take 4 mg by mouth as needed for diarrhea or loose stools., Disp: , Rfl:    meloxicam (MOBIC) 7.5 MG tablet, meloxicam 7.5 mg tablet  TK 1 T PO  D PRN, Disp: , Rfl:    metoprolol succinate (TOPROL-XL) 25 MG 24 hr tablet, Take 25 mg by mouth daily., Disp: , Rfl:    RABEprazole (ACIPHEX) 20 MG tablet, Take 20 mg by mouth daily. , Disp: , Rfl:     ALLERGIES   Aspirin     REVIEW OF SYSTEMS    Review of Systems:  Gen:  Denies  fever, sweats, chills weigh loss  HEENT: Denies blurred vision, double vision, ear pain, eye pain, hearing loss, nose bleeds, sore throat Cardiac:  No dizziness, chest pain or heaviness, chest tightness,edema Resp:   Denies cough or sputum porduction, shortness of breath,wheezing, hemoptysis,  Gi: Denies swallowing difficulty, stomach pain, nausea or vomiting, diarrhea, constipation, bowel incontinence Gu:  Denies bladder incontinence, burning urine Ext:   Denies Joint pain, stiffness or swelling Skin: Denies  skin rash, easy bruising or bleeding or hives Endoc:  Denies polyuria, polydipsia , polyphagia or weight change Psych:   Denies depression, insomnia or hallucinations   Other:  All other systems negative   VS: BP 105/76   Pulse 77   Temp 98.6 F (37 C) (Oral)   Resp (!) 22   Ht '5\' 1"'$  (1.549 m)   Wt 52.6 kg   SpO2 99%   BMI 21.92 kg/m      PHYSICAL EXAM    GENERAL:NAD, no fevers, chills, no weakness no fatigue HEAD: Normocephalic, atraumatic.  EYES: Pupils equal, round, reactive to light. Extraocular muscles intact. No scleral icterus.  MOUTH: Moist mucosal membrane. Dentition intact. No  abscess noted.  EAR, NOSE, THROAT: Clear without exudates. No external lesions.  NECK: Supple. No thyromegaly. No nodules. No JVD.  PULMONARY: Diffuse coarse rhonchi right sided +wheezes CARDIOVASCULAR: S1 and S2. Regular rate and rhythm. No  murmurs, rubs, or gallops. No edema. Pedal pulses 2+ bilaterally.  GASTROINTESTINAL: Soft, nontender, nondistended. No masses. Positive bowel sounds. No hepatosplenomegaly.  MUSCULOSKELETAL: No swelling, clubbing, or edema. Range of motion full in all extremities.  NEUROLOGIC: Cranial nerves II through XII are intact. No gross focal neurological deficits. Sensation intact. Reflexes intact.  SKIN: No ulceration, lesions, rashes, or cyanosis. Skin warm and dry. Turgor intact.  PSYCHIATRIC: Mood, affect within normal limits. The patient is awake, alert and oriented x 3. Insight, judgment intact.       IMAGING    CT CHEST WO CONTRAST  Result Date: 07/09/2021 CLINICAL DATA:  Abnormal x-ray with pleural effusion. Shortness of breath today. EXAM: CT CHEST WITHOUT CONTRAST TECHNIQUE: Multidetector CT imaging of the chest was performed following the standard protocol without IV contrast. COMPARISON:  06/26/2020.  Chest radiograph 07/08/2021 FINDINGS: Cardiovascular: Mild cardiac enlargement. No pericardial effusion. Calcification in the mitral valve annulus, coronary arteries, and aorta. 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 Aneurysm of the lower descending thoracic aorta with maximal AP diameter of about 5.5 cm. Appearance is similar to previous study. Mediastinum/Nodes: Thyroid gland is unremarkable. Moderately prominent mediastinal lymph nodes with pretracheal nodes measuring up to about 1.8 cm short axis dimension. Esophagus is decompressed. Lungs/Pleura: Small right pleural effusion. Significant volume loss in the right lung with heterogeneous consolidation and air bronchograms throughout the right lung  but most prominent in the right middle and right lower lungs. Minimal residual aeration in the right upper lung. There is a cut off sign to the right bronchus intermedius which may indicate obstructing mass or endobronchial process. The left lung demonstrates diffuse emphysematous changes with peripheral fibrosis and multiple pulmonary nodules which have developed since the prior study. Overall, changes are likely to represent a right hilar or endobronchial mass causing postobstructive change on the right and metastatic disease throughout both lungs. Upper Abdomen: Prominent vascular calcifications. No acute process identified in the visualized upper abdomen. Musculoskeletal: Degenerative changes in the spine. No destructive bone lesions. IMPRESSION: 1. Collapse and consolidation of the right lung with suspicion of obstructing lesion involving the right bronchus intermedius. This is likely to represent primary lung cancer. 2. Diffuse pulmonary nodule seen best in the aerated left lung, likely metastatic disease. 3. Mediastinal lymphadenopathy is likely metastatic. 4. Underlying emphysematous and chronic bronchitic changes in the lungs with slight peripheral fibrosis. 5. 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 6. Diffuse aortic atherosclerosis. Electronically Signed   By: Lucienne Capers M.D.   On: 07/09/2021 01:23   DG Chest Portable 1 View  Result Date: 07/08/2021 CLINICAL DATA:  feeling short of breath today Best films possible due to pt condition. CHf/COPd exacerbation.e val pulm congestion/infiltrate EXAM: PORTABLE CHEST 1 VIEW COMPARISON:  Chest x-ray 06/26/2020 FINDINGS: The heart and mediastinal contours are grossly unchanged. Aortic calcification. Right lung pulmonary staples noted. Almost complete opacification of the right hemithorax. Increased interstitial markings bilaterally. Likely some component of right pleural effusion. No left pleural effusion. No pneumothorax. No  acute osseous abnormality. IMPRESSION: Almost complete opacification of the right hemithorax as well as increased interstitial markings bilaterally. Likely some component of right pleural effusion. Findings suggestive of infection/inflammation. Underlying malignancy not excluded. Recommend CT chest with intravenous contrast for further evaluation. Electronically Signed   By: Iven Finn M.D.   On: 07/08/2021 22:48   ECHOCARDIOGRAM COMPLETE  Result Date: 07/09/2021    ECHOCARDIOGRAM REPORT   Patient Name:   Felicia  Sellers Date of Exam: 07/09/2021 Medical Rec #:  [id number]    Height:       61.0 in Accession #:    [id number]   Weight:       116.0 lb Date of Birth:  29-Oct-1940    BSA:          1.498 m Patient Age:    30 years     BP:           114/58 mmHg Patient Gender: F            HR:           116 bpm. Exam Location:  [facility] Procedure: 2D Echo, Cardiac Doppler and Color Doppler Indications:     CHF-Acute Diastolic [id number]  History:         Patient has prior history of Echocardiogram examinations. CHF,                  CAD; Risk Factors:Hypertension.  Sonographer:     Alyse Low Roar Referring Phys:  [id number] Athena Masse Diagnosing Phys: Isaias Cowman MD IMPRESSIONS  1. Left ventricular ejection fraction, by estimation, is 55 to 60%. The left ventricle has normal function. The left ventricle has no regional wall motion abnormalities. Left ventricular diastolic parameters are indeterminate.  2. Right ventricular systolic function is normal. The right ventricular size is normal.  3. The mitral valve is normal in structure. Mild mitral valve regurgitation. No evidence of mitral stenosis.  4. Tricuspid valve regurgitation is moderate.  5. The aortic valve is normal in structure. Aortic valve regurgitation is not visualized. No aortic stenosis is present.  6. The inferior vena cava is normal in size with greater than 50% respiratory variability, suggesting right atrial pressure of 3 mmHg. FINDINGS  Left Ventricle:  Left ventricular ejection fraction, by estimation, is 55 to 60%. The left ventricle has normal function. The left ventricle has no regional wall motion abnormalities. The left ventricular internal cavity size was normal in size. There is  no left ventricular hypertrophy. Left ventricular diastolic parameters are indeterminate. Right Ventricle: The right ventricular size is normal. No increase in right ventricular wall thickness. Right ventricular systolic function is normal. Left Atrium: Left atrial size was normal in size. Right Atrium: Right atrial size was normal in size. Pericardium: There is no evidence of pericardial effusion. Mitral Valve: The mitral valve is normal in structure. Mild mitral valve regurgitation. No evidence of mitral valve stenosis. Tricuspid Valve: The tricuspid valve is normal in structure. Tricuspid valve regurgitation is moderate . No evidence of tricuspid stenosis. Aortic Valve: The aortic valve is normal in structure. Aortic valve regurgitation is not visualized. No aortic stenosis is present. Aortic valve peak gradient measures 7.1 mmHg. Pulmonic Valve: The pulmonic valve was normal in structure. Pulmonic valve regurgitation is not visualized. No evidence of pulmonic stenosis. Aorta: The aortic root is normal in size and structure. Venous: The inferior vena cava is normal in size with greater than 50% respiratory variability, suggesting right atrial pressure of 3 mmHg. IAS/Shunts: No atrial level shunt detected by color flow Doppler.  LEFT VENTRICLE PLAX 2D LVIDd:         4.10 cm  Diastology LVIDs:         2.90 cm  LV e' medial:    9.03 cm/s LV PW:         1.10 cm  LV E/e' medial:  17.7 LV IVS:        1.20 cm  LV e' lateral:   6.31 cm/s LVOT diam:     1.70 cm  LV E/e' lateral: 25.4 LVOT Area:     2.27 cm  RIGHT VENTRICLE RV Basal diam:  2.40 cm RV Mid diam:    2.10 cm RV S prime:     12.20 cm/s TAPSE (M-mode): 1.5 cm LEFT ATRIUM             Index       RIGHT ATRIUM           Index LA  diam:        3.70 cm 2.47 cm/m  RA Area:     14.30 cm LA Vol (A2C):   41.2 ml 27.50 ml/m RA Volume:   32.30 ml  21.56 ml/m LA Vol (A4C):   31.9 ml 21.29 ml/m LA Biplane Vol: 36.7 ml 24.49 ml/m  AORTIC VALVE                PULMONIC VALVE AV Area (Vmax): 1.93 cm    PV Vmax:        1.22 m/s AV Vmax:        133.00 cm/s PV Peak grad:   6.0 mmHg AV Peak Grad:   7.1 mmHg    RVOT Peak grad: 2 mmHg LVOT Vmax:      113.00 cm/s  AORTA Ao Root diam: 2.70 cm MITRAL VALVE                TRICUSPID VALVE MV Area (PHT): 6.83 cm     TR Peak grad:   35.0 mmHg MV Decel Time: 111 msec     TR Vmax:        296.00 cm/s MV E velocity: 160.00 cm/s                             SHUNTS                             Systemic Diam: 1.70 cm Isaias Cowman MD Electronically signed by Isaias Cowman MD Signature Date/Time: 07/09/2021/1:24:30 PM    Final       ASSESSMENT/PLAN   This is a pleasant 80 yr old lady here in respiratory failure, known copd, acute on chronic diastolic heart failure, echo suggestive of secondary pulmonary hypertension and afib. Cardiology has seen, recommendations noted.   Unfortunately looks like she has a mallgnant obstructive right bronchus intermedius lesion with distal collapse. Bronchoscopy is warranted. Will dis cuss with bronchuscopy team as we tune her up for the procedure as you doing.      Thank you for allowing me to participate in the care of this patient.   Patient/Family are satisfied with care plan and all questions have been answered.  This document was prepared using Dragon voice recognition software and may include unintentional dictation errors.     Wallene Huh, M.D.  Division of [facility]

## 2021-07-09 NOTE — Evaluation (Signed)
Occupational Therapy Evaluation Patient Details Name: Lexius Bex MRN: [id number] DOB: 22-Jan-1941 Today's Date: 07/09/2021   History of Present Illness Pt is an 80 y/o F with PMH: COPD, CHF and h/o lung cancer with 1/3 lobe removed in 2004. Pt presented d/t SOB and cough. Chest CT negative for PE. MD assessment of CT includes: Collapse and consolidation of the right lung with suspicion of  obstructing lesion involving the right bronchus intermedius. This is  likely to represent primary lung cancer.   Clinical Impression   Pt seen for OT evaluation this date in setting of acute hospitalization d/t cough and SOB. Pt reports being INDEP at baseline with fxl mobility and BADLs. States she uses public transportation and states that her nephew gets groceries and cooks. Pt lives in [facility] with nephew, but he works during the day. Pt presents this date with decreased fxl activity tolerance and weakness. Pt requires CGA with ADL transfers and hand held assist at least unilaterally. Pt requires SETUP for seated UB ADLs and MIN A for seated LB ADLs. Of note: notified RN that pt's HR upto 149 with SPS transfers and notified that her O2 dropped to 87% even while on nasal cannula with transfers. Will continue to follow acutely. Anticipate pt could benefit from [facility] f/u to ensure safety with ADLs in the natural environment.      Recommendations for follow up therapy are one component of a multi-disciplinary discharge planning process, led by the attending physician.  Recommendations may be updated based on patient status, additional functional criteria and insurance authorization.   Follow Up Recommendations  Home health OT    Equipment Recommendations  3 in 1 bedside commode;Tub/shower seat;Other (comment) (2ww)    Recommendations for Other Services       Precautions / Restrictions Precautions Precautions: Fall Restrictions Weight Bearing Restrictions: No      Mobility Bed Mobility Overal bed  mobility: Needs Assistance Bed Mobility: Supine to Sit;Sit to Supine     Supine to sit: Min guard;HOB elevated Sit to supine: Supervision   General bed mobility comments: increased time    Transfers Overall transfer level: Needs assistance Equipment used: 1 person hand held assist Transfers: Sit to/from [facility] Sit to Stand: Min guard Stand pivot transfers: Min guard            Balance Overall balance assessment: Mild deficits observed, not formally tested   Sitting balance-Leahy Scale: Good       Standing balance-Leahy Scale: Fair Standing balance comment: benefits from at least unilateral support                           ADL either performed or assessed with clinical judgement   ADL Overall ADL's : Needs assistance/impaired                                       General ADL Comments: SETUP for seated UB ADLs, MIN A for seated LB ADLs, CGA for standing balance with hand held assist     Vision Patient Visual Report: No change from baseline       Perception     Praxis      Pertinent Vitals/Pain Pain Assessment: No/denies pain     Hand Dominance Right   Extremity/Trunk Assessment Upper Extremity Assessment Upper Extremity Assessment: Generalized weakness   Lower Extremity Assessment Lower Extremity Assessment:  Generalized weakness   Cervical / Trunk Assessment Cervical / Trunk Assessment: Kyphotic   Communication Communication Communication: No difficulties   Cognition Arousal/Alertness: Awake/alert Behavior During Therapy: WFL for tasks assessed/performed Overall Cognitive Status: Within Functional Limits for tasks assessed                                 General Comments: HOH   General Comments       Exercises Other Exercises Other Exercises: OT ed re: role of OT in acute setting. Pt with good understanding   Shoulder Instructions      Home Living Family/patient expects to  be discharged to:: Private residence Living Arrangements: Other relatives (nephew) Available Help at Discharge: Family;Available PRN/intermittently (he works during the day) Type of Home: House Home Access: Stairs to enter Technical brewer of Steps: 6 Entrance Stairs-Rails: Right Home Layout: One level               Home Equipment: Environmental consultant - 2 wheels   Additional Comments: doesn't use      Prior Functioning/Environment Level of Independence: Needs assistance  Gait / Transfers Assistance Needed: INDEP with no AD, but endorses furniture walking in the home ADL's / Homemaking Assistance Needed: Pt is able to perform all basic self care. States that her nephew does most of the cooking. She does not drive, he gets groceries. She is able to do her own laundry            OT Problem List: Decreased strength;Decreased activity tolerance;Cardiopulmonary status limiting activity      OT Treatment/Interventions: Self-care/ADL training;Therapeutic exercise;DME and/or AE instruction;Therapeutic activities;Energy conservation    OT Goals(Current goals can be found in the care plan section) Acute Rehab OT Goals Patient Stated Goal: to go home OT Goal Formulation: With patient Time For Goal Achievement: 07/23/21 Potential to Achieve Goals: Good ADL Goals Pt Will Perform Lower Body Dressing: with supervision;with adaptive equipment Pt Will Transfer to Toilet: with supervision;ambulating Pt Will Perform Toileting - Clothing Manipulation and hygiene: with supervision;sit to/from stand  OT Frequency: Min 1X/week   Barriers to D/C:            Co-evaluation              AM-PAC OT "6 Clicks" Daily Activity     Outcome Measure Help from another person eating meals?: None Help from another person taking care of personal grooming?: A Little Help from another person toileting, which includes using toliet, bedpan, or urinal?: A Lot Help from another person bathing (including  washing, rinsing, drying)?: A Little Help from another person to put on and taking off regular upper body clothing?: A Little Help from another person to put on and taking off regular lower body clothing?: A Little 6 Click Score: 18   End of Session Equipment Utilized During Treatment: Gait belt Nurse Communication: Mobility status;Other (comment) (notified RN that pt's HR upto 149 with SPS transfers and notified that her O2 dropped to 87% even while on nasal cannula with transfers)  Activity Tolerance: Patient tolerated treatment well Patient left: with call bell/phone within reach  OT Visit Diagnosis: Unsteadiness on feet (R26.81);Muscle weakness (generalized) (M62.81)                Time: [id number] OT Time Calculation (min): 34 min Charges:  OT General Charges $OT Visit: 1 Visit OT Evaluation $OT Eval Moderate Complexity: 1 Mod OT Treatments $Self Care/Home Management :  8-22 mins  Gerrianne Scale, [place], OTR/L ascom [phone] 07/09/21, 5:11 PM

## 2021-07-09 NOTE — H&P (Addendum)
History and Physical    Felicia Sellers [id number] DOB: 10-Apr-1941 DOA: 07/08/2021  PCP: [facility]   Patient coming from: home  I have personally briefly reviewed patient's old medical records in [place]  Chief Complaint: Shortness of breath and cough  HPI: Felicia Sellers is a 80 y.o. female with medical history significant for Right lung cancer status post lobectomy, HTN, COPD, CHF, CAD, chronic hyponatremia who presents to the ED with shortness of breath, cough, muscle aches and generalized weakness.  She denied fever or chills.  She was brought in by EMS who recorded O2 sat of 87 on room air and she was placed on O2 via nasal cannula and treated with DuoNeb in route.  History taken from ER provider as patient was lethargic by the time of my assessment and unable to contribute to history  ED course: On arrival, afebrile, BP 114/58 with pulse of 72, respirations 22 with O2 sat 95% on O2 at 2 L Blood work with WBC 16,000, lactic acid 1.4.  Troponin 14/8, BNP 288.  Sodium 129.  Labs otherwise unremarkable.  COVID and flu negative  EKG, personally viewed and interpreted NSR at 72 with nonspecific ST-T wave changes  Imaging: Chest x-ray with complete opacification of right hemithorax CT chest without contrast collapse and consolidation of the right lung with suspicion of obstructing lesion involving the right bronchus intermedius.  This is likely to represent primary lung cancer.  Mediastinal lymphadenopathy likely metastatic, underlying emphysematous and chronic bronchitic changes  Patient treated with duo nebs and Solu-Medrol, IV Lasix, Rocephin and azithromycin .    Hospitalist consulted for admission.  Review of Systems: Unable to obtain due to somnolence   Past Medical History:  Diagnosis Date   Cancer ([place])    right lung removed - lung cancer 1/3 lung removed    CHF (congestive heart failure) (HCC)    COPD (chronic obstructive pulmonary disease) (HCC)     Coronary artery disease    History of stomach ulcers    Hypertension    Pneumonia     Past Surgical History:  Procedure Laterality Date   LUNG REMOVAL, PARTIAL Left      reports that she has been smoking cigarettes. She has a 66.00 pack-year smoking history. She has never used smokeless tobacco. She reports that she does not currently use alcohol. She reports that she does not use drugs.  Allergies  Allergen Reactions   Aspirin Other (See Comments)    High risk of bleeding    Family History  Problem Relation Age of Onset   Breast cancer Neg Hx       Prior to Admission medications   Medication Sig Start Date End Date Taking? Authorizing Provider  Albuterol Sulfate (PROAIR RESPICLICK) [id number] (90 Base) MCG/ACT AEPB Inhale 2 puffs into the lungs every 6 (six) hours as needed. 06/28/20   Danford, Suann Larry, MD  alendronate (FOSAMAX) 70 MG tablet Take 70 mg by mouth once a week. Take with a full glass of water on an empty stomach.    [provider]  amLODipine (NORVASC) 10 MG tablet Take 10 mg by mouth daily.    [provider]  atorvastatin (LIPITOR) 40 MG tablet Take 40 mg by mouth daily.    [provider]  calcium carbonate (OS-CAL - DOSED IN MG OF ELEMENTAL CALCIUM) 1250 (500 Ca) MG tablet Take 1 tablet by mouth 2 (two) times daily with a meal.     [provider]  Cholecalciferol 1000 units capsule Take 1,000 Units by mouth daily.    [provider]  docusate sodium (COLACE) 100 MG capsule Take 100 mg by mouth daily.     [provider]  gabapentin (NEURONTIN) 300 MG capsule Take 300 mg by mouth 3 (three) times daily.    [provider]  lisinopril (ZESTRIL) 10 MG tablet Take 10 mg by mouth daily.     [provider]  loperamide (IMODIUM) 2 MG capsule Take 4 mg by mouth as needed for diarrhea or loose stools.    [provider]  meloxicam (MOBIC) 7.5 MG tablet meloxicam 7.5 mg tablet  TK 1 T PO  D  PRN    [provider]  metoprolol succinate (TOPROL-XL) 25 MG 24 hr tablet Take 25 mg by mouth daily.    [provider]  RABEprazole (ACIPHEX) 20 MG tablet Take 20 mg by mouth daily.  09/16/19 09/15/20  [provider]    Physical Exam: Vitals:   07/09/21 0000 07/09/21 0220 07/09/21 0300 07/09/21 0315  BP: 103/87  129/68   Pulse: 79 69 76 70  Resp: (!) 21 19 (!) 26 (!) 22  Temp:      TempSrc:      SpO2: 100% (!) 87% (!) 87% 100%  Weight:      Height:         Vitals:   07/09/21 0000 07/09/21 0220 07/09/21 0300 07/09/21 0315  BP: 103/87  129/68   Pulse: 79 69 76 70  Resp: (!) 21 19 (!) 26 (!) 22  Temp:      TempSrc:      SpO2: 100% (!) 87% (!) 87% 100%  Weight:      Height:          Constitutional: Lethargic, somnolent, arousable but readily falls back asleep.  Tachypneic HEENT:      Head: Normocephalic and atraumatic.         Eyes: PERLA, EOMI, Conjunctivae are normal. Sclera is non-icteric.       Mouth/Throat: Mucous membranes are moist.       Neck: Supple with no signs of meningismus. Cardiovascular: Regular rate and rhythm. No murmurs, gallops, or rubs. 2+ symmetrical distal pulses are present . No JVD. No LE edema Respiratory: Respiratory effort increased, coarse breath sounds bilaterally.  Diminished bilaterally Gastrointestinal: Soft, non tender, and non distended with positive bowel sounds.  Genitourinary: No CVA tenderness. Musculoskeletal: Nontender with normal range of motion in all extremities. No cyanosis, or erythema of extremities. Neurologic:  Face is symmetric. Moving all extremities. No gross focal neurologic deficits . Skin: Skin is warm, dry.  No rash or ulcers Psychiatric: Unable to assess due to increased somnolence   Labs on Admission: I have personally reviewed following labs and imaging studies  CBC: Recent Labs  Lab 07/08/21 2206  WBC 15.9*  NEUTROABS 12.3*  HGB 13.4  HCT 39.5  MCV 92.5  PLT [id number]   Basic  Metabolic Panel: Recent Labs  Lab 07/09/21 0017  NA 129*  K 3.7  CL 92*  CO2 24  GLUCOSE 100*  BUN 12  CREATININE 0.75  CALCIUM 8.6*   GFR: Estimated Creatinine Clearance: 40.9 mL/min (by C-G formula based on SCr of 0.75 mg/dL). Liver Function Tests: Recent Labs  Lab 07/09/21 0017  AST 14*  ALT 10  ALKPHOS 118  BILITOT 1.3*  PROT 7.1  ALBUMIN 3.7   No results for input(s): LIPASE, AMYLASE in the last 168 hours. No  results for input(s): AMMONIA in the last 168 hours. Coagulation Profile: Recent Labs  Lab 07/08/21 2302  INR 1.1   Cardiac Enzymes: No results for input(s): CKTOTAL, CKMB, CKMBINDEX, TROPONINI in the last 168 hours. BNP (last 3 results) No results for input(s): PROBNP in the last 8760 hours. HbA1C: No results for input(s): HGBA1C in the last 72 hours. CBG: No results for input(s): GLUCAP in the last 168 hours. Lipid Profile: No results for input(s): CHOL, HDL, LDLCALC, TRIG, CHOLHDL, LDLDIRECT in the last 72 hours. Thyroid Function Tests: No results for input(s): TSH, T4TOTAL, FREET4, T3FREE, THYROIDAB in the last 72 hours. Anemia Panel: No results for input(s): VITAMINB12, FOLATE, FERRITIN, TIBC, IRON, RETICCTPCT in the last 72 hours. Urine analysis:    Component Value Date/Time   COLORURINE STRAW (A) 06/26/2020 2109   APPEARANCEUR CLEAR (A) 06/26/2020 2109   APPEARANCEUR Clear 05/28/2014 0855   LABSPEC 1.009 06/26/2020 2109   LABSPEC 1.013 05/28/2014 0855   PHURINE 6.0 06/26/2020 2109   GLUCOSEU NEGATIVE 06/26/2020 2109   GLUCOSEU Negative 05/28/2014 0855   HGBUR NEGATIVE 06/26/2020 2109   BILIRUBINUR NEGATIVE 06/26/2020 2109   BILIRUBINUR Negative 05/28/2014 0855   KETONESUR NEGATIVE 06/26/2020 2109   PROTEINUR NEGATIVE 06/26/2020 2109   NITRITE NEGATIVE 06/26/2020 2109   LEUKOCYTESUR NEGATIVE 06/26/2020 2109   LEUKOCYTESUR Negative 05/28/2014 0855    Radiological Exams on Admission: CT CHEST WO CONTRAST  Result Date:  07/09/2021 CLINICAL DATA:  Abnormal x-ray with pleural effusion. Shortness of breath today. EXAM: CT CHEST WITHOUT CONTRAST TECHNIQUE: Multidetector CT imaging of the chest was performed following the standard protocol without IV contrast. COMPARISON:  06/26/2020.  Chest radiograph 07/08/2021 FINDINGS: Cardiovascular: Mild cardiac enlargement. No pericardial effusion. Calcification in the mitral valve annulus, coronary arteries, and aorta. 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 Aneurysm of the lower descending thoracic aorta with maximal AP diameter of about 5.5 cm. Appearance is similar to previous study. Mediastinum/Nodes: Thyroid gland is unremarkable. Moderately prominent mediastinal lymph nodes with pretracheal nodes measuring up to about 1.8 cm short axis dimension. Esophagus is decompressed. Lungs/Pleura: Small right pleural effusion. Significant volume loss in the right lung with heterogeneous consolidation and air bronchograms throughout the right lung but most prominent in the right middle and right lower lungs. Minimal residual aeration in the right upper lung. There is a cut off sign to the right bronchus intermedius which may indicate obstructing mass or endobronchial process. The left lung demonstrates diffuse emphysematous changes with peripheral fibrosis and multiple pulmonary nodules which have developed since the prior study. Overall, changes are likely to represent a right hilar or endobronchial mass causing postobstructive change on the right and metastatic disease throughout both lungs. Upper Abdomen: Prominent vascular calcifications. No acute process identified in the visualized upper abdomen. Musculoskeletal: Degenerative changes in the spine. No destructive bone lesions. IMPRESSION: 1. Collapse and consolidation of the right lung with suspicion of obstructing lesion involving the right bronchus intermedius. This is likely to  represent primary lung cancer. 2. Diffuse pulmonary nodule seen best in the aerated left lung, likely metastatic disease. 3. Mediastinal lymphadenopathy is likely metastatic. 4. Underlying emphysematous and chronic bronchitic changes in the lungs with slight peripheral fibrosis. 5. 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 6. Diffuse aortic atherosclerosis. Electronically Signed   By: Lucienne Capers M.D.   On: 07/09/2021 01:23   DG Chest Portable 1 View  Result Date: 07/08/2021 CLINICAL DATA:  feeling short of breath  today Best films possible due to pt condition. CHf/COPd exacerbation.e val pulm congestion/infiltrate EXAM: PORTABLE CHEST 1 VIEW COMPARISON:  Chest x-ray 06/26/2020 FINDINGS: The heart and mediastinal contours are grossly unchanged. Aortic calcification. Right lung pulmonary staples noted. Almost complete opacification of the right hemithorax. Increased interstitial markings bilaterally. Likely some component of right pleural effusion. No left pleural effusion. No pneumothorax. No acute osseous abnormality. IMPRESSION: Almost complete opacification of the right hemithorax as well as increased interstitial markings bilaterally. Likely some component of right pleural effusion. Findings suggestive of infection/inflammation. Underlying malignancy not excluded. Recommend CT chest with intravenous contrast for further evaluation. Electronically Signed   By: Iven Finn M.D.   On: 07/08/2021 22:48     Assessment/Plan 80 year old female with history of right lung cancer status post lobectomy, HTN, COPD, CHF, CAD, chronic hyponatremia who presents to the ED with shortness of breath, cough, muscle aches and generalized weakness.     Acute respiratory failure with hypoxia (HCC) -O2 sat was 87% on room air with EMS with no prior history of home O2 use - Suspect related to postobstructive pneumonia with some element of heart failure exacerbation..  Lower suspicion for COPD  exacerbation - Continue O2 to keep sats over 90 and wean as tolerated - Treat etiology  Postobstructive pneumonia Personal history of right lung cancer s/p lobectomy - Patient with cough and shortness of breath and myalgias and leukocytosis of 16,000 -CT chest without contrast collapse and consolidation of the right lung with suspicion of obstructing lesion involving the right bronchus intermedius.  This is likely to represent primary lung cancer.  Mediastinal lymphadenopathy likely metastatic, underlying emphysematous and chronic bronchitic changes - Continue Rocephin and azithromycin - Mucolytic's - Supplemental O2 - Consider pulmonology consult for consideration of bronchoscopy  Acute on chronic diastolic heart failure Right pleural effusion - Physical exam with coarse crackles bilaterally, BNP elevated to 288 - Received Lasix in the ED - Continue metoprolol and lisinopril -Daily weights with intake and output monitoring.  Fluid restriction for now - Echocardiogram.  Last echo from September 2021 showed normal diastolic parameters and EF 70 to 75% - Cardiology consult  Altered mental status/acute metabolic encephalopathy - Patient very lethargic, somnolent on physical exam suspect related to acute medical condition - Fall and aspiration precautions - We will keep n.p.o. for now - Neurologic checks and avoid sedating meds    Chronic hyponatremia - Sodium 129.  Baseline 129-132 - Continue to monitor     Hypertension - Continue amlodipine and lisinopril and metoprolol    Coronary artery disease involving native coronary artery of native heart without angina pectoris - Continue atorvastatin and metoprolol - Patient denied chest pain and EKG nonacute.  Troponin 14/8     COPD, moderate (HCC) - DuoNebs as needed     DVT prophylaxis: Lovenox  Code Status: full code  Family Communication:  none  Disposition Plan: Back to previous home environment Consults called:  Cardiology Status:At the time of admission, it appears that the appropriate admission status for this patient is INPATIENT. This is judged to be reasonable and necessary in order to provide the required intensity of service to ensure the patient's safety given the presenting symptoms, physical exam findings, and initial radiographic and laboratory data in the context of their  Comorbid conditions.   Patient requires inpatient status due to high intensity of service, high risk for further deterioration and high frequency of surveillance required.   I certify that at the point of admission it is  my clinical judgment that the patient will require inpatient hospital care spanning beyond [facility] MD [facility]     07/09/2021, 3:20 AM

## 2021-07-09 NOTE — ED Notes (Signed)
Informed RN bed assigned 

## 2021-07-09 NOTE — ED Notes (Signed)
Report to crystal, rn.

## 2021-07-09 NOTE — ED Provider Notes (Signed)
Patient was signed out to me pending labs.  History of COPD, CHF lung cancer status post right upper lobe lobectomy who presents with dyspnea myalgias cough.  Her x-ray shows complete whiteout of the right lung.  Labs notable for a leukocytosis, she is tachypneic and mildly tachycardic meeting SIRS criteria.  She was treated with ceftriaxone as a throw.  Her lactate is normal.  Blood cultures have been sent.  Otherwise labs notable for mild hyponatremia.  I obtained a CT of the chest which shows a postobstructive pneumonia with likely obstructing lesion.  Will admit to the hospitalist service.  She has no oxygen requirement this time.   Rada Hay, MD 07/09/21 [phone]

## 2021-07-09 NOTE — Consult Note (Signed)
[facility]  CARDIOLOGY CONSULT NOTE  Patient ID: Felicia Sellers MRN: [id number] DOB/AGE: 04-25-1941 80 y.o.  Admit date: 07/08/2021 Referring Physician [facility] Primary Physician [facility] Primary Cardiologist Parrish Daddario Reason for Consultation paroxysmal atrial fibrillation/atrial flutter  HPI: 80 year old female referred for evaluation of paroxysmal atrial fibrillation/atrial flutter.  Patient presented to [facility] ED 07/09/2021 with shortness of breath, cough, generalized weakness.  He was noted to be hypoxic with O2 saturation 87% on room air requiring O2 by nasal cannula.  Chest x-ray revealed opacification of right hemothorax.  Chest CT revealed obstructing lesion right bronchus intermedius with mediastinal lymphadenopathy likely metastatic.  The patient has a history of right lung cancer status post lobectomy.  ECG on admission revealed atrial fibrillation at a rate of 118 bpm with underlying left bundle branch block.  The patient denies palpitations.  Symmetry currently reveals sinus rhythm with heart rate 77 bpm.  2D echocardiogram revealed normal left ventricular function, with LVEF 55 to 60%.  Sensitivity troponin were normal 14, 8.  Review of systems complete and found to be negative unless listed above     Past Medical History:  Diagnosis Date   Cancer ([place])    right lung removed - lung cancer 1/3 lung removed    CHF (congestive heart failure) (HCC)    COPD (chronic obstructive pulmonary disease) (HCC)    Coronary artery disease    History of stomach ulcers    Hypertension    Pneumonia     Past Surgical History:  Procedure Laterality Date   LUNG REMOVAL, PARTIAL Left     (Not in a hospital admission)  Social History   Socioeconomic History   Marital status: Single    Spouse name: Not on file   Number of children: Not on file   Years of education: Not on file   Highest education level: Not on file  Occupational History   Not on file  Tobacco Use   Smoking status: Every Day     Packs/day: 1.00    Years: 66.00    Pack years: 66.00    Types: Cigarettes    Last attempt to quit: 06/26/2020    Years since quitting: 1.0   Smokeless tobacco: Never  Vaping Use   Vaping Use: Never used  Substance and Sexual Activity   Alcohol use: Not Currently    Comment: use to drink alot ( vodka) 6 months ago   Drug use: Never   Sexual activity: Not Currently  Other Topics Concern   Not on file  Social History Narrative   Not on file   Social Determinants of Health   Financial Resource Strain: Not on file  Food Insecurity: Not on file  Transportation Needs: Not on file  Physical Activity: Not on file  Stress: Not on file  Social Connections: Not on file  Intimate Partner Violence: Not on file    Family History  Problem Relation Age of Onset   Breast cancer Neg Hx       Review of systems complete and found to be negative unless listed above      PHYSICAL EXAM  General: Well developed, well nourished, in no acute distress HEENT:  Normocephalic and atramatic Neck:  No JVD.  Lungs: Clear bilaterally to auscultation and percussion. Heart: HRRR . Normal S1 and S2 without gallops or murmurs.  Abdomen: Bowel sounds are positive, abdomen soft and non-tender  Msk:  Back normal, normal gait. Normal strength and tone for age. Extremities: No clubbing, cyanosis or edema.  Neuro: Alert and oriented X 3. Psych:  Good affect, responds appropriately  Labs:   Lab Results  Component Value Date   WBC 13.5 (H) 07/09/2021   HGB 14.1 07/09/2021   HCT 39.2 07/09/2021   MCV 92.7 07/09/2021   PLT 267 07/09/2021    Recent Labs  Lab 07/09/21 0017  NA 129*  K 3.7  CL 92*  CO2 24  BUN 12  CREATININE 0.75  CALCIUM 8.6*  PROT 7.1  BILITOT 1.3*  ALKPHOS 118  ALT 10  AST 14*  GLUCOSE 100*   Lab Results  Component Value Date   TROPONINI < 0.02 05/28/2014   No results found for: CHOL No results found for: HDL No results found for: LDLCALC No results found for:  TRIG No results found for: CHOLHDL No results found for: LDLDIRECT    Radiology: CT CHEST WO CONTRAST  Result Date: 07/09/2021 CLINICAL DATA:  Abnormal x-ray with pleural effusion. Shortness of breath today. EXAM: CT CHEST WITHOUT CONTRAST TECHNIQUE: Multidetector CT imaging of the chest was performed following the standard protocol without IV contrast. COMPARISON:  06/26/2020.  Chest radiograph 07/08/2021 FINDINGS: Cardiovascular: Mild cardiac enlargement. No pericardial effusion. Calcification in the mitral valve annulus, coronary arteries, and aorta. 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 Aneurysm of the lower descending thoracic aorta with maximal AP diameter of about 5.5 cm. Appearance is similar to previous study. Mediastinum/Nodes: Thyroid gland is unremarkable. Moderately prominent mediastinal lymph nodes with pretracheal nodes measuring up to about 1.8 cm short axis dimension. Esophagus is decompressed. Lungs/Pleura: Small right pleural effusion. Significant volume loss in the right lung with heterogeneous consolidation and air bronchograms throughout the right lung but most prominent in the right middle and right lower lungs. Minimal residual aeration in the right upper lung. There is a cut off sign to the right bronchus intermedius which may indicate obstructing mass or endobronchial process. The left lung demonstrates diffuse emphysematous changes with peripheral fibrosis and multiple pulmonary nodules which have developed since the prior study. Overall, changes are likely to represent a right hilar or endobronchial mass causing postobstructive change on the right and metastatic disease throughout both lungs. Upper Abdomen: Prominent vascular calcifications. No acute process identified in the visualized upper abdomen. Musculoskeletal: Degenerative changes in the spine. No destructive bone lesions. IMPRESSION: 1. Collapse and consolidation  of the right lung with suspicion of obstructing lesion involving the right bronchus intermedius. This is likely to represent primary lung cancer. 2. Diffuse pulmonary nodule seen best in the aerated left lung, likely metastatic disease. 3. Mediastinal lymphadenopathy is likely metastatic. 4. Underlying emphysematous and chronic bronchitic changes in the lungs with slight peripheral fibrosis. 5. 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 6. Diffuse aortic atherosclerosis. Electronically Signed   By: Lucienne Capers M.D.   On: 07/09/2021 01:23   DG Chest Portable 1 View  Result Date: 07/08/2021 CLINICAL DATA:  feeling short of breath today Best films possible due to pt condition. CHf/COPd exacerbation.e val pulm congestion/infiltrate EXAM: PORTABLE CHEST 1 VIEW COMPARISON:  Chest x-ray 06/26/2020 FINDINGS: The heart and mediastinal contours are grossly unchanged. Aortic calcification. Right lung pulmonary staples noted. Almost complete opacification of the right hemithorax. Increased interstitial markings bilaterally. Likely some component of right pleural effusion. No left pleural effusion. No pneumothorax. No acute osseous abnormality. IMPRESSION: Almost complete opacification of the right hemithorax as well as increased interstitial markings bilaterally. Likely some component of right pleural effusion. Findings suggestive  of infection/inflammation. Underlying malignancy not excluded. Recommend CT chest with intravenous contrast for further evaluation. Electronically Signed   By: Iven Finn M.D.   On: 07/08/2021 22:48   ECHOCARDIOGRAM COMPLETE  Result Date: 07/09/2021    ECHOCARDIOGRAM REPORT   Patient Name:   Felicia Sellers Date of Exam: 07/09/2021 Medical Rec #:  [id number]    Height:       61.0 in Accession #:    [id number]   Weight:       116.0 lb Date of Birth:  [birth year]    BSA:          1.498 m Patient Age:    21 years     BP:           114/58 mmHg Patient Gender: F             HR:           116 bpm. Exam Location:  [facility] Procedure: 2D Echo, Cardiac Doppler and Color Doppler Indications:     CHF-Acute Diastolic [id number]  History:         Patient has prior history of Echocardiogram examinations. CHF,                  CAD; Risk Factors:Hypertension.  Sonographer:     Alyse Low Roar Referring Phys:  [id number] Athena Masse Diagnosing Phys: Isaias Cowman MD IMPRESSIONS  1. Left ventricular ejection fraction, by estimation, is 55 to 60%. The left ventricle has normal function. The left ventricle has no regional wall motion abnormalities. Left ventricular diastolic parameters are indeterminate.  2. Right ventricular systolic function is normal. The right ventricular size is normal.  3. The mitral valve is normal in structure. Mild mitral valve regurgitation. No evidence of mitral stenosis.  4. Tricuspid valve regurgitation is moderate.  5. The aortic valve is normal in structure. Aortic valve regurgitation is not visualized. No aortic stenosis is present.  6. The inferior vena cava is normal in size with greater than 50% respiratory variability, suggesting right atrial pressure of 3 mmHg. FINDINGS  Left Ventricle: Left ventricular ejection fraction, by estimation, is 55 to 60%. The left ventricle has normal function. The left ventricle has no regional wall motion abnormalities. The left ventricular internal cavity size was normal in size. There is  no left ventricular hypertrophy. Left ventricular diastolic parameters are indeterminate. Right Ventricle: The right ventricular size is normal. No increase in right ventricular wall thickness. Right ventricular systolic function is normal. Left Atrium: Left atrial size was normal in size. Right Atrium: Right atrial size was normal in size. Pericardium: There is no evidence of pericardial effusion. Mitral Valve: The mitral valve is normal in structure. Mild mitral valve regurgitation. No evidence of mitral valve stenosis. Tricuspid Valve: The  tricuspid valve is normal in structure. Tricuspid valve regurgitation is moderate . No evidence of tricuspid stenosis. Aortic Valve: The aortic valve is normal in structure. Aortic valve regurgitation is not visualized. No aortic stenosis is present. Aortic valve peak gradient measures 7.1 mmHg. Pulmonic Valve: The pulmonic valve was normal in structure. Pulmonic valve regurgitation is not visualized. No evidence of pulmonic stenosis. Aorta: The aortic root is normal in size and structure. Venous: The inferior vena cava is normal in size with greater than 50% respiratory variability, suggesting right atrial pressure of 3 mmHg. IAS/Shunts: No atrial level shunt detected by color flow Doppler.  LEFT VENTRICLE PLAX 2D LVIDd:         4.10 cm  Diastology  LVIDs:         2.90 cm  LV e' medial:    9.03 cm/s LV PW:         1.10 cm  LV E/e' medial:  17.7 LV IVS:        1.20 cm  LV e' lateral:   6.31 cm/s LVOT diam:     1.70 cm  LV E/e' lateral: 25.4 LVOT Area:     2.27 cm  RIGHT VENTRICLE RV Basal diam:  2.40 cm RV Mid diam:    2.10 cm RV S prime:     12.20 cm/s TAPSE (M-mode): 1.5 cm LEFT ATRIUM             Index       RIGHT ATRIUM           Index LA diam:        3.70 cm 2.47 cm/m  RA Area:     14.30 cm LA Vol (A2C):   41.2 ml 27.50 ml/m RA Volume:   32.30 ml  21.56 ml/m LA Vol (A4C):   31.9 ml 21.29 ml/m LA Biplane Vol: 36.7 ml 24.49 ml/m  AORTIC VALVE                PULMONIC VALVE AV Area (Vmax): 1.93 cm    PV Vmax:        1.22 m/s AV Vmax:        133.00 cm/s PV Peak grad:   6.0 mmHg AV Peak Grad:   7.1 mmHg    RVOT Peak grad: 2 mmHg LVOT Vmax:      113.00 cm/s  AORTA Ao Root diam: 2.70 cm MITRAL VALVE                TRICUSPID VALVE MV Area (PHT): 6.83 cm     TR Peak grad:   35.0 mmHg MV Decel Time: 111 msec     TR Vmax:        296.00 cm/s MV E velocity: 160.00 cm/s                             SHUNTS                             Systemic Diam: 1.70 cm Isaias Cowman MD Electronically signed by Isaias Cowman MD Signature Date/Time: 07/09/2021/1:24:30 PM    Final     EKG: Atrial fibrillation at a rate of 118 bpm  ASSESSMENT AND PLAN:   1.  Paroxysmal atrial fibrillation/atrial flutter, currently in sinus rhythm, on metoprolol succinate 25 mg daily, asymptomatic, likely compensatory for respiratory failure in the setting of the static lung cancer 2.  History of right lung cancer, status post lobectomy, presents with obstructing lesion right bronchus intermedius, with mediastinal lymphadenopathy 3.  Acute on chronic diastolic congestive heart failure, with history of essential hypertension, on metoprolol and lisinopril, received furosemide in ED, 2D echocardiogram reveals normal left ventricular function, with LVEF 55 to 60%  Recommendations  1.  Agree with current therapy 2.  Defer chronic anticoagulation for paroxysmal atrial fibrillation in light of patient's obstructing lesion right bronchus intermedius with mediastinal adenopathy 3.  Continue metoprolol succinate 25 mg daily, may uptitrate as needed for uncontrolled atrial fibrillation with rapid ventricular rate 4.  Continue gentle diuresis with furosemide 20 mg daily 5.  No further cardiac diagnostics at this time  Signed: Sheppard Coil  Auda Finfrock MD,PhD, [facility] 07/09/2021, 1:37 PM

## 2021-07-10 ENCOUNTER — Inpatient Hospital Stay: Payer: Medicare Other

## 2021-07-10 LAB — CBC
HCT: 37.7 % (ref 36.0–46.0)
Hemoglobin: 13.4 g/dL (ref 12.0–15.0)
MCH: 32.7 pg (ref 26.0–34.0)
MCHC: 35.5 g/dL (ref 30.0–36.0)
MCV: 92 fL (ref 80.0–100.0)
Platelets: 299 10*3/uL (ref 150–400)
RBC: 4.1 MIL/uL (ref 3.87–5.11)
RDW: 13.2 % (ref 11.5–15.5)
WBC: 18.5 10*3/uL — ABNORMAL HIGH (ref 4.0–10.5)
nRBC: 0 % (ref 0.0–0.2)

## 2021-07-10 LAB — BASIC METABOLIC PANEL
Anion gap: 9 (ref 5–15)
BUN: 15 mg/dL (ref 8–23)
CO2: 29 mmol/L (ref 22–32)
Calcium: 8.6 mg/dL — ABNORMAL LOW (ref 8.9–10.3)
Chloride: 93 mmol/L — ABNORMAL LOW (ref 98–111)
Creatinine, Ser: 0.62 mg/dL (ref 0.44–1.00)
GFR, Estimated: >60 mL/min (ref >60)
Glucose, Bld: 109 mg/dL — ABNORMAL HIGH (ref 70–99)
Potassium: 3.2 mmol/L — ABNORMAL LOW (ref 3.5–5.1)
Sodium: 131 mmol/L — ABNORMAL LOW (ref 135–145)

## 2021-07-10 LAB — MAGNESIUM: Magnesium: 1.9 mg/dL (ref 1.7–2.4)

## 2021-07-10 LAB — TSH: TSH: 0.673 u[IU]/mL (ref 0.350–4.500)

## 2021-07-10 LAB — PROCALCITONIN: Procalcitonin: <0.1 ng/mL

## 2021-07-10 MED ORDER — METOPROLOL TARTRATE 50 MG PO TABS: 50.0000 mg | ORAL_TABLET | Freq: Two times a day (BID) | ORAL | Status: DC

## 2021-07-10 MED ORDER — FOLIC ACID 1 MG PO TABS
1.0000 mg | ORAL_TABLET | Freq: Every day | ORAL | Status: DC
  Administered 2021-07-10 – 2021-07-13 (×4): 1 mg via ORAL
  Filled 2021-07-10 (×4): qty 1

## 2021-07-10 MED ORDER — LORAZEPAM 1 MG PO TABS: 1.0000 mg | ORAL_TABLET | ORAL | Status: AC | PRN

## 2021-07-10 MED ORDER — ADULT MULTIVITAMIN W/MINERALS CH
1.0000 | ORAL_TABLET | Freq: Every day | ORAL | Status: DC
  Administered 2021-07-10 – 2021-07-13 (×4): 1 via ORAL
  Filled 2021-07-10 (×4): qty 1

## 2021-07-10 MED ORDER — INFLUENZA VAC A&B SA ADJ QUAD 0.5 ML IM PRSY
0.5000 mL | PREFILLED_SYRINGE | INTRAMUSCULAR | Status: DC | PRN
  Filled 2021-07-10: qty 0.5

## 2021-07-10 MED ORDER — THIAMINE HCL 100 MG PO TABS
100.0000 mg | ORAL_TABLET | Freq: Every day | ORAL | Status: DC
  Administered 2021-07-11 – 2021-07-13 (×3): 100 mg via ORAL
  Filled 2021-07-10 (×3): qty 1

## 2021-07-10 MED ORDER — METOPROLOL TARTRATE 25 MG PO TABS
25.0000 mg | ORAL_TABLET | Freq: Once | ORAL | Status: AC
  Administered 2021-07-10: 25 mg via ORAL
  Filled 2021-07-10: qty 1

## 2021-07-10 MED ORDER — POTASSIUM CHLORIDE CRYS ER 20 MEQ PO TBCR
40.0000 meq | EXTENDED_RELEASE_TABLET | Freq: Every day | ORAL | Status: AC
  Administered 2021-07-10 – 2021-07-11 (×2): 40 meq via ORAL
  Filled 2021-07-10 (×2): qty 2

## 2021-07-10 MED ORDER — LORAZEPAM 2 MG/ML IJ SOLN: 1.0000 mg | INTRAMUSCULAR | Status: AC | PRN

## 2021-07-10 MED ORDER — METOPROLOL TARTRATE 50 MG PO TABS
50.0000 mg | ORAL_TABLET | Freq: Two times a day (BID) | ORAL | Status: DC
  Administered 2021-07-11: 50 mg via ORAL
  Filled 2021-07-10: qty 1

## 2021-07-10 MED ORDER — GADOBUTROL 1 MMOL/ML IV SOLN
5.0000 mL | Freq: Once | INTRAVENOUS | Status: AC | PRN
  Administered 2021-07-10: 5 mL via INTRAVENOUS
  Filled 2021-07-10: qty 6

## 2021-07-10 MED ORDER — THIAMINE HCL 100 MG/ML IJ SOLN
100.0000 mg | Freq: Every day | INTRAMUSCULAR | Status: DC
  Administered 2021-07-10: 100 mg via INTRAVENOUS
  Filled 2021-07-10: qty 2

## 2021-07-10 NOTE — Progress Notes (Signed)
Called pharmacy and spoke w/[facility]... notified him that pt's PIV has infiltrated while receiving IV Rocephin and Azithromycin... Rocephin completed w/in 30 min and Azithromycin had run x30 min until pt called out... Corene Cornea sts no further actions needed at this time  Left arm swollen and tender... placed cold compressions and elevated arm... pt resting comfortably. Will monitor pt's arm

## 2021-07-10 NOTE — Progress Notes (Signed)
[facility] Cardiology  SUBJECTIVE: Patient denies chest pain or palpitations   Vitals:   07/10/21 0500 07/10/21 0600 07/10/21 0700 07/10/21 1000  BP: 121/65 119/69 117/80 (!) 122/94  Pulse: (!) 105 86 99 (!) 110  Resp: (!) 21 (!) 24 19 (!) 28  Temp:   97.8 F (36.6 C)   TempSrc:      SpO2: 100% 100% 97% 95%  Weight:      Height:         Intake/Output Summary (Last 24 hours) at 07/10/2021 1145 Last data filed at 07/10/2021 0200 Gross per 24 hour  Intake 250 ml  Output --  Net 250 ml      PHYSICAL EXAM  General: Well developed, well nourished, in no acute distress HEENT:  Normocephalic and atramatic Neck:  No JVD.  Lungs: Clear bilaterally to auscultation and percussion. Heart: HRRR . Normal S1 and S2 without gallops or murmurs.  Abdomen: Bowel sounds are positive, abdomen soft and non-tender  Msk:  Back normal, normal gait. Normal strength and tone for age. Extremities: No clubbing, cyanosis or edema.   Neuro: Alert and oriented X 3. Psych:  Good affect, responds appropriately   LABS: Basic Metabolic Panel: Recent Labs    07/09/21 0017 07/10/21 0729  NA 129* 131*  K 3.7 3.2*  CL 92* 93*  CO2 24 29  GLUCOSE 100* 109*  BUN 12 15  CREATININE 0.75 0.62  CALCIUM 8.6* 8.6*  MG  --  1.9   Liver Function Tests: Recent Labs    07/09/21 0017  AST 14*  ALT 10  ALKPHOS 118  BILITOT 1.3*  PROT 7.1  ALBUMIN 3.7   No results for input(s): LIPASE, AMYLASE in the last 72 hours. CBC: Recent Labs    07/08/21 2206 07/09/21 0725 07/10/21 0729  WBC 15.9* 13.5* 18.5*  NEUTROABS 12.3*  --   --   HGB 13.4 14.1 13.4  HCT 39.5 39.2 37.7  MCV 92.5 92.7 92.0  PLT 270 267 299   Cardiac Enzymes: No results for input(s): CKTOTAL, CKMB, CKMBINDEX, TROPONINI in the last 72 hours. BNP: Invalid input(s): POCBNP D-Dimer: No results for input(s): DDIMER in the last 72 hours. Hemoglobin A1C: No results for input(s): HGBA1C in the last 72 hours. Fasting Lipid Panel: No  results for input(s): CHOL, HDL, LDLCALC, TRIG, CHOLHDL, LDLDIRECT in the last 72 hours. Thyroid Function Tests: Recent Labs    07/10/21 0729  TSH 0.673   Anemia Panel: No results for input(s): VITAMINB12, FOLATE, FERRITIN, TIBC, IRON, RETICCTPCT in the last 72 hours.  CT CHEST WO CONTRAST  Result Date: 07/09/2021 CLINICAL DATA:  Abnormal x-ray with pleural effusion. Shortness of breath today. EXAM: CT CHEST WITHOUT CONTRAST TECHNIQUE: Multidetector CT imaging of the chest was performed following the standard protocol without IV contrast. COMPARISON:  06/26/2020.  Chest radiograph 07/08/2021 FINDINGS: Cardiovascular: Mild cardiac enlargement. No pericardial effusion. Calcification in the mitral valve annulus, coronary arteries, and aorta. 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 Aneurysm of the lower descending thoracic aorta with maximal AP diameter of about 5.5 cm. Appearance is similar to previous study. Mediastinum/Nodes: Thyroid gland is unremarkable. Moderately prominent mediastinal lymph nodes with pretracheal nodes measuring up to about 1.8 cm short axis dimension. Esophagus is decompressed. Lungs/Pleura: Small right pleural effusion. Significant volume loss in the right lung with heterogeneous consolidation and air bronchograms throughout the right lung but most prominent in the right middle and right lower lungs. Minimal residual aeration  in the right upper lung. There is a cut off sign to the right bronchus intermedius which may indicate obstructing mass or endobronchial process. The left lung demonstrates diffuse emphysematous changes with peripheral fibrosis and multiple pulmonary nodules which have developed since the prior study. Overall, changes are likely to represent a right hilar or endobronchial mass causing postobstructive change on the right and metastatic disease throughout both lungs. Upper Abdomen: Prominent vascular  calcifications. No acute process identified in the visualized upper abdomen. Musculoskeletal: Degenerative changes in the spine. No destructive bone lesions. IMPRESSION: 1. Collapse and consolidation of the right lung with suspicion of obstructing lesion involving the right bronchus intermedius. This is likely to represent primary lung cancer. 2. Diffuse pulmonary nodule seen best in the aerated left lung, likely metastatic disease. 3. Mediastinal lymphadenopathy is likely metastatic. 4. Underlying emphysematous and chronic bronchitic changes in the lungs with slight peripheral fibrosis. 5. 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 6. Diffuse aortic atherosclerosis. Electronically Signed   By: Lucienne Capers M.D.   On: 07/09/2021 01:23   MR BRAIN W WO CONTRAST  Result Date: 07/10/2021 CLINICAL DATA:  Hematologic malignancy, staging. EXAM: MRI HEAD WITHOUT AND WITH CONTRAST TECHNIQUE: Multiplanar, multiecho pulse sequences of the brain and surrounding structures were obtained without and with intravenous contrast. CONTRAST:  53m GADAVIST GADOBUTROL 1 MMOL/ML IV SOLN COMPARISON:  Head CT 05/28/2014 FINDINGS: Brain: No enhancement or swelling to suggest metastatic disease. No acute infarction, hemorrhage, hydrocephalus, extra-axial collection or mass lesion. Chronic small vessel ischemia that is extensive in the hemispheric white matter and mild in the pons. Small remote bilateral cerebellar infarcts. Vascular: Normal flow voids. Skull and upper cervical spine: Normal marrow signal. Sinuses/Orbits: Negative. IMPRESSION: 1. No acute finding or evidence of metastatic disease. 2. Extensive chronic small vessel ischemia. Electronically Signed   By: JJorje GuildM.D.   On: 07/10/2021 11:39   DG Chest Portable 1 View  Result Date: 07/08/2021 CLINICAL DATA:  feeling short of breath today Best films possible due to pt condition. CHf/COPd exacerbation.e val pulm congestion/infiltrate EXAM:  PORTABLE CHEST 1 VIEW COMPARISON:  Chest x-ray 06/26/2020 FINDINGS: The heart and mediastinal contours are grossly unchanged. Aortic calcification. Right lung pulmonary staples noted. Almost complete opacification of the right hemithorax. Increased interstitial markings bilaterally. Likely some component of right pleural effusion. No left pleural effusion. No pneumothorax. No acute osseous abnormality. IMPRESSION: Almost complete opacification of the right hemithorax as well as increased interstitial markings bilaterally. Likely some component of right pleural effusion. Findings suggestive of infection/inflammation. Underlying malignancy not excluded. Recommend CT chest with intravenous contrast for further evaluation. Electronically Signed   By: MIven FinnM.D.   On: 07/08/2021 22:48   ECHOCARDIOGRAM COMPLETE  Result Date: 07/09/2021    ECHOCARDIOGRAM REPORT   Patient Name:   JNORVA PARGADate of Exam: 07/09/2021 Medical Rec #:  [id number]   Height:       61.0 in Accession #:    [id number]  Weight:       116.0 lb Date of Birth:  7May 21, 1942   BSA:          1.498 m Patient Age:    89years     BP:           114/58 mmHg Patient Gender: F            HR:           116 bpm. Exam Location:  [facility] Procedure: 2D  Echo, Cardiac Doppler and Color Doppler Indications:     CHF-Acute Diastolic [id number]  History:         Patient has prior history of Echocardiogram examinations. CHF,                  CAD; Risk Factors:Hypertension.  Sonographer:     Alyse Low Roar Referring Phys:  [id number] Athena Masse Diagnosing Phys: Isaias Cowman MD IMPRESSIONS  1. Left ventricular ejection fraction, by estimation, is 55 to 60%. The left ventricle has normal function. The left ventricle has no regional wall motion abnormalities. Left ventricular diastolic parameters are indeterminate.  2. Right ventricular systolic function is normal. The right ventricular size is normal.  3. The mitral valve is normal in structure. Mild mitral valve  regurgitation. No evidence of mitral stenosis.  4. Tricuspid valve regurgitation is moderate.  5. The aortic valve is normal in structure. Aortic valve regurgitation is not visualized. No aortic stenosis is present.  6. The inferior vena cava is normal in size with greater than 50% respiratory variability, suggesting right atrial pressure of 3 mmHg. FINDINGS  Left Ventricle: Left ventricular ejection fraction, by estimation, is 55 to 60%. The left ventricle has normal function. The left ventricle has no regional wall motion abnormalities. The left ventricular internal cavity size was normal in size. There is  no left ventricular hypertrophy. Left ventricular diastolic parameters are indeterminate. Right Ventricle: The right ventricular size is normal. No increase in right ventricular wall thickness. Right ventricular systolic function is normal. Left Atrium: Left atrial size was normal in size. Right Atrium: Right atrial size was normal in size. Pericardium: There is no evidence of pericardial effusion. Mitral Valve: The mitral valve is normal in structure. Mild mitral valve regurgitation. No evidence of mitral valve stenosis. Tricuspid Valve: The tricuspid valve is normal in structure. Tricuspid valve regurgitation is moderate . No evidence of tricuspid stenosis. Aortic Valve: The aortic valve is normal in structure. Aortic valve regurgitation is not visualized. No aortic stenosis is present. Aortic valve peak gradient measures 7.1 mmHg. Pulmonic Valve: The pulmonic valve was normal in structure. Pulmonic valve regurgitation is not visualized. No evidence of pulmonic stenosis. Aorta: The aortic root is normal in size and structure. Venous: The inferior vena cava is normal in size with greater than 50% respiratory variability, suggesting right atrial pressure of 3 mmHg. IAS/Shunts: No atrial level shunt detected by color flow Doppler.  LEFT VENTRICLE PLAX 2D LVIDd:         4.10 cm  Diastology LVIDs:         2.90 cm   LV e' medial:    9.03 cm/s LV PW:         1.10 cm  LV E/e' medial:  17.7 LV IVS:        1.20 cm  LV e' lateral:   6.31 cm/s LVOT diam:     1.70 cm  LV E/e' lateral: 25.4 LVOT Area:     2.27 cm  RIGHT VENTRICLE RV Basal diam:  2.40 cm RV Mid diam:    2.10 cm RV S prime:     12.20 cm/s TAPSE (M-mode): 1.5 cm LEFT ATRIUM             Index       RIGHT ATRIUM           Index LA diam:        3.70 cm 2.47 cm/m  RA Area:     14.30 cm LA  Vol [facility]):   41.2 ml 27.50 ml/m RA Volume:   32.30 ml  21.56 ml/m LA Vol (A4C):   31.9 ml 21.29 ml/m LA Biplane Vol: 36.7 ml 24.49 ml/m  AORTIC VALVE                PULMONIC VALVE AV Area (Vmax): 1.93 cm    PV Vmax:        1.22 m/s AV Vmax:        133.00 cm/s PV Peak grad:   6.0 mmHg AV Peak Grad:   7.1 mmHg    RVOT Peak grad: 2 mmHg LVOT Vmax:      113.00 cm/s  AORTA Ao Root diam: 2.70 cm MITRAL VALVE                TRICUSPID VALVE MV Area (PHT): 6.83 cm     TR Peak grad:   35.0 mmHg MV Decel Time: 111 msec     TR Vmax:        296.00 cm/s MV E velocity: 160.00 cm/s                             SHUNTS                             Systemic Diam: 1.70 cm Isaias Cowman MD Electronically signed by Isaias Cowman MD Signature Date/Time: 07/09/2021/1:24:30 PM    Final      Echo LVEF 55 to 60% x 2 D echocardiogram 07/09/2021  TELEMETRY: Sinus rhythm with LBBB 90 bpm:  ASSESSMENT AND PLAN:  Principal Problem:   Acute respiratory failure with hypoxia (HCC) Active Problems:   Chronic hyponatremia   Hypertension   Community acquired pneumonia   Coronary artery disease involving native coronary artery of native heart without angina pectoris   COPD, moderate (HCC)   Personal history of lung cancer   CAP (community acquired pneumonia)   Obstructive pneumonia    1.  Paroxysmal atrial fibrillation/atrial flutter, currently in sinus rhythm, on metoprolol succinate 25 mg daily, asymptomatic, likely compensatory for respiratory failure in the setting of the static lung  cancer 2.  History of right lung cancer, status post lobectomy, presents with obstructing lesion right bronchus intermedius, with mediastinal lymphadenopathy 3.  Acute on chronic diastolic congestive heart failure, with history of essential hypertension, on metoprolol and lisinopril, received furosemide in ED, 2D echocardiogram reveals normal left ventricular function, with LVEF 55 to 60%   Recommendations   1.  Agree with current therapy 2.  Defer chronic anticoagulation for paroxysmal atrial fibrillation in light of patient's obstructing lesion right bronchus intermedius with mediastinal adenopathy 3.  Continue metoprolol succinate 25 mg daily, may uptitrate as needed for uncontrolled atrial fibrillation with rapid ventricular rate 4.  Continue gentle diuresis with furosemide 20 mg daily 5.  No further cardiac diagnostics at this time   Isaias Cowman, MD, PhD, [facility] 07/10/2021 11:45 AM  We will sign off for now, please call if any questions

## 2021-07-10 NOTE — ED Notes (Signed)
Taken to MRI 

## 2021-07-10 NOTE — Evaluation (Signed)
Physical Therapy Evaluation Patient Details Name: Felicia Sellers MRN: [id number] DOB: 11/06/1940 Today's Date: 07/10/2021  History of Present Illness  Pt is an 80 y/o F with PMH: COPD, CHF and h/o lung cancer with 1/3 lobe removed in 2004. Pt presented d/t SOB and cough. Chest CT negative for PE. MD assessment of CT includes: Collapse and consolidation of the right lung with suspicion of  obstructing lesion involving the right bronchus intermedius. This is  likely to represent primary lung cancer.  Clinical Impression  Pt seen for PT evaluation with pt HOH. Pt is able to transfer supine<>sit with supervision, extra time & HOB elevated. Pt attempts to ambulate around bed but limited by O2 line & transport staff arriving to take her to MRI. Pt stands next to [place] in place but requires BUE support on EOB & cannot maintain balance without UE support. PT evaluation limited at this time, but pt would benefit from attempting gait with RW. Pt c/o nasal cannula not placed properly but it appears in her nostrils, pt's SpO2 drops to 76% after gait/standing & nurse notified but returns to 90% within ~1 minute. Pt left in bed in handoff to transport staff. This PT recommends pt d/c to STR to maximize independence with functional mobility & reduce fall risk prior to return home.  Of note, pt states she lives with her nephew "that would like to see me dead" but states she does not feel unsafe with him, just that he doesn't assist her with tasks.        Recommendations for follow up therapy are one component of a multi-disciplinary discharge planning process, led by the attending physician.  Recommendations may be updated based on patient status, additional functional criteria and insurance authorization.  Follow Up Recommendations SNF    Equipment Recommendations  None recommended by PT (TBD in next venue)    Recommendations for Other Services       Precautions / Restrictions  Precautions Precautions: Fall Restrictions Weight Bearing Restrictions: No      Mobility  Bed Mobility Overal bed mobility: Needs Assistance Bed Mobility: Supine to Sit;Sit to Supine     Supine to sit: Supervision;HOB elevated Sit to supine: Supervision;HOB elevated   General bed mobility comments: increased time    Transfers Overall transfer level: Needs assistance Equipment used: 1 person hand held assist Transfers: Sit to/from Stand Sit to Stand: Min guard            Ambulation/Gait Ambulation/Gait assistance: Min guard Gait Distance (Feet): 6 Feet Assistive device:  (pt elects to hold to EOB) Gait Pattern/deviations: Decreased step length - right;Decreased step length - left;Decreased stride length Gait velocity: decreased      Stairs            Wheelchair Mobility    Modified Rankin (Stroke Patients Only)       Balance Overall balance assessment: Mild deficits observed, not formally tested Sitting-balance support: Feet supported;No upper extremity supported Sitting balance-Leahy Scale: Good     Standing balance support: Bilateral upper extremity supported;No upper extremity supported Standing balance-Leahy Scale: Fair Standing balance comment: poor balance without BUE support                             Pertinent Vitals/Pain Pain Assessment: No/denies pain    Home Living Family/patient expects to be discharged to:: Private residence Living Arrangements: Other relatives (nephew) Available Help at Discharge: Family;Available PRN/intermittently (nephew works during  the day) Type of Home: House Home Access: Stairs to enter Entrance Stairs-Rails: Right Entrance Stairs-Number of Steps: 6 Home Layout: One level Home Equipment: Walker - 2 wheels Additional Comments: doesn't use    Prior Function Level of Independence: Needs assistance   Gait / Transfers Assistance Needed: INDEP with no AD, but endorses furniture walking in the  home  ADL's / Homemaking Assistance Needed: Pt is able to perform all basic self care. States that her nephew does most of the cooking. She does not drive, he gets groceries. She is able to do her own laundry        Hand Dominance   Dominant Hand: Right    Extremity/Trunk Assessment   Upper Extremity Assessment Upper Extremity Assessment: Generalized weakness    Lower Extremity Assessment Lower Extremity Assessment: Generalized weakness    Cervical / Trunk Assessment Cervical / Trunk Assessment: Kyphotic  Communication   Communication: HOH  Cognition Arousal/Alertness: Awake/alert Behavior During Therapy: WFL for tasks assessed/performed Overall Cognitive Status: Within Functional Limits for tasks assessed                                 General Comments: HOH      General Comments      Exercises     Assessment/Plan    PT Assessment Patient needs continued PT services  PT Problem List Decreased strength;Decreased mobility;Decreased safety awareness;Decreased knowledge of precautions;Decreased activity tolerance;Decreased balance;Decreased knowledge of use of DME;Cardiopulmonary status limiting activity       PT Treatment Interventions DME instruction;Gait training;Balance training;Therapeutic exercise;Stair training;Neuromuscular re-education;Modalities;Functional mobility training;Therapeutic activities;Patient/family education    PT Goals (Current goals can be found in the Care Plan section)  Acute Rehab PT Goals Patient Stated Goal: to go home PT Goal Formulation: With patient Time For Goal Achievement: 07/24/21 Potential to Achieve Goals: Fair    Frequency Min 2X/week   Barriers to discharge Decreased caregiver support;Inaccessible home environment      Co-evaluation               AM-PAC PT "6 Clicks" Mobility  Outcome Measure Help needed turning from your back to your side while in a flat bed without using bedrails?: None Help  needed moving from lying on your back to sitting on the side of a flat bed without using bedrails?: A Little Help needed moving to and from a bed to a chair (including a wheelchair)?: A Little Help needed standing up from a chair using your arms (e.g., wheelchair or bedside chair)?: A Little Help needed to walk in hospital room?: A Little Help needed climbing 3-5 steps with a railing? : A Lot 6 Click Score: 18    End of Session Equipment Utilized During Treatment: Oxygen Activity Tolerance:  (limited 2/2 transport arriving to take pt for MRI) Patient left: in bed (in care of transport staff)   PT Visit Diagnosis: Unsteadiness on feet (R26.81);Muscle weakness (generalized) (M62.81);Difficulty in walking, not elsewhere classified (R26.2)    Time: [id number] PT Time Calculation (min) (ACUTE ONLY): 15 min   Charges:   PT Evaluation $PT Eval Moderate Complexity: [place], PT, DPT 07/10/21, 11:18 AM   Waunita Schooner 07/10/2021, 11:15 AM

## 2021-07-10 NOTE — Progress Notes (Addendum)
PROGRESS NOTE    Felicia Sellers  [id number] DOB: July 26, 1941 DOA: 07/08/2021 PCP: [facility]  Outpatient Specialists: oncology, cardiology    Brief Narrative:  From admission hpi: Felicia Sellers is a 80 y.o. female with medical history significant for Right lung cancer status post lobectomy, HTN, COPD, CHF, CAD, chronic hyponatremia who presents to the ED with shortness of breath, cough, muscle aches and generalized weakness.  She denied fever or chills.  She was brought in by EMS who recorded O2 sat of 87 on room air and she was placed on O2 via nasal cannula and treated with DuoNeb in route.  History taken from ER provider as patient was lethargic by the time of my assessment and unable to contribute to history   Assessment & Plan:   Principal Problem:   Acute respiratory failure with hypoxia ([place]) Active Problems:   Chronic hyponatremia   Hypertension   Community acquired pneumonia   Coronary artery disease involving native coronary artery of native heart without angina pectoris   COPD, moderate ([place])   Personal history of lung cancer   CAP (community acquired pneumonia)   Obstructive pneumonia    Acute respiratory failure with hypoxia ([place]) O2 sat was 87% on room air with EMS with no prior history of home O2 use. Suspect related to postobstructive pneumonia with some element of heart failure exacerbation. On 6 L HFNC currently. Per nephew patient has o2 requirement, previously prescribed, she declined it.  - Continue O2 to keep sats over 90 and wean as tolerated - Treat etiology   Postobstructive pneumonia? Personal history of right lung cancer s/p lobectomy History copd Patient with cough and shortness of breath and myalgias and leukocytosis of 16,00. CT chest without contrast collapse and consolidation of the right lung with suspicion of obstructing lesion involving the right bronchus intermedius.  This is likely to represent primary lung cancer.  Mediastinal  lymphadenopathy likely metastatic, underlying emphysematous and chronic bronchitic changes. Procalcitonin low, this is less likely infectious - cont ceftriaxone/azith for now, will discuss abx w/ pulm - add edprednisone given hx copd - Mucolytic's - Supplemental O2 - pulmonology planning bronchoscopy early this coming week - discussed w/ dr. Janese Banks of oncology, will need f/u with her as outpt, outpt PET. Requests mri of brain w/wo contrast which I will order - PT advising SNF, will need to discuss w/ patient   Chronic diastolic heart failure Bnp moderately elevated to 200s. Received lasix in ed. Does not appear to be significantly fluid overload. TTE w/o significant cardiac dysfunction - cont metop - added lasix 20 - cardiology following  Paroxysmal a fib New a fib here, likely 2/2 pulmonary process. Hr improved to wnl with increase in home metop - home metop increased to tartrate 25 bid - hold anticoagulation given malignancy, plan for bronchoscopy - TTE w/o sig abnormality - f/u tsh  Hypokalemia 3.2 - 80 meq oral ordered - f/u mg  Alcohol use History very heavy drinking, pt reports last several years 1-2 drinks per day. No s/s withdrawal - monitor on ciwa   Acute metabolic encephalopathy Dementia Encephalopathy resolved. Presbycusis contributes - hold gabapentin   Chronic hyponatremia Likely 2/2 pulmonary process. Sodium 129.  Baseline 129-132 - Continue to monitor    Hypertension Here bp low normal - hold home amlod and lisin   History NICM - Continue atorvastatin and metoprolol - Patient denied chest pain and EKG nonacute.  Troponin 14/8       COPD, moderate ([facility]) -  DuoNebs as needed   DVT prophylaxis: lovenox Code Status: full Family Communication: friend (listed as nephew but not a blood relation; she lives with him) updated telephonically 9/18. He says she has no immediate family  Level of care: Progressive Cardiac Status is: Inpatient  Remains  inpatient appropriate because:Inpatient level of care appropriate due to severity of illness  Dispo: The patient is from: Home              Anticipated d/c is to: tbd. Pt/ot consulted              Patient currently is not medically stable to d/c.   Difficult to place patient No     Consultants:  pulmonology  Procedures: none  Antimicrobials:  Azithromycin/ceftriaxone 9/16>    Subjective: Breathing stable, has some cough  Objective: Vitals:   07/10/21 0400 07/10/21 0500 07/10/21 0600 07/10/21 0700  BP: 124/63 121/65 119/69 117/80  Pulse: 65 (!) 105 86 99  Resp: 19 (!) 21 (!) 24 19  Temp:    97.8 F (36.6 C)  TempSrc:      SpO2: 100% 100% 100% 97%  Weight:      Height:        Intake/Output Summary (Last 24 hours) at 07/10/2021 0918 Last data filed at 07/10/2021 0200 Gross per 24 hour  Intake 250 ml  Output --  Net 250 ml   Filed Weights   07/08/21 2159  Weight: 52.6 kg    Examination:  General exam: Appears calm and comfortable. Chronically ill appearing Respiratory system: diffuse scattered rhonchi, decreased breath sounds in bases Cardiovascular system: S1 & S2 heard, RRR. No JVD, murmurs, rubs, gallops or clicks. No pedal edema. Gastrointestinal system: Abdomen is nondistended, soft and nontender. No organomegaly or masses felt. Normal bowel sounds heard. Central nervous system: Alert, confused. Moving all 4 extremities Extremities: Symmetric 5 x 5 power. Skin: No rashes, lesions or ulcers Psychiatry: calm, confused    Data Reviewed: I have personally reviewed following labs and imaging studies  CBC: Recent Labs  Lab 07/08/21 2206 07/09/21 0725 07/10/21 0729  WBC 15.9* 13.5* 18.5*  NEUTROABS 12.3*  --   --   HGB 13.4 14.1 13.4  HCT 39.5 39.2 37.7  MCV 92.5 92.7 92.0  PLT 270 267 [id number]   Basic Metabolic Panel: Recent Labs  Lab 07/09/21 0017 07/10/21 0729  NA 129* 131*  K 3.7 3.2*  CL 92* 93*  CO2 24 29  GLUCOSE 100* 109*  BUN 12 15   CREATININE 0.75 0.62  CALCIUM 8.6* 8.6*   GFR: Estimated Creatinine Clearance: 40.9 mL/min (by C-G formula based on SCr of 0.62 mg/dL). Liver Function Tests: Recent Labs  Lab 07/09/21 0017  AST 14*  ALT 10  ALKPHOS 118  BILITOT 1.3*  PROT 7.1  ALBUMIN 3.7   No results for input(s): LIPASE, AMYLASE in the last 168 hours. No results for input(s): AMMONIA in the last 168 hours. Coagulation Profile: Recent Labs  Lab 07/08/21 2302  INR 1.1   Cardiac Enzymes: No results for input(s): CKTOTAL, CKMB, CKMBINDEX, TROPONINI in the last 168 hours. BNP (last 3 results) No results for input(s): PROBNP in the last 8760 hours. HbA1C: No results for input(s): HGBA1C in the last 72 hours. CBG: No results for input(s): GLUCAP in the last 168 hours. Lipid Profile: No results for input(s): CHOL, HDL, LDLCALC, TRIG, CHOLHDL, LDLDIRECT in the last 72 hours. Thyroid Function Tests: No results for input(s): TSH, T4TOTAL, FREET4, T3FREE, THYROIDAB in the  last 72 hours. Anemia Panel: No results for input(s): VITAMINB12, FOLATE, FERRITIN, TIBC, IRON, RETICCTPCT in the last 72 hours. Urine analysis:    Component Value Date/Time   COLORURINE YELLOW 07/09/2021 [place] 07/09/2021 0857   APPEARANCEUR Clear 05/28/2014 0855   LABSPEC 1.010 07/09/2021 0857   LABSPEC 1.013 05/28/2014 0855   PHURINE 5.5 07/09/2021 [place] 07/09/2021 0857   GLUCOSEU Negative 05/28/2014 0855   HGBUR NEGATIVE 07/09/2021 0857   BILIRUBINUR NEGATIVE 07/09/2021 0857   BILIRUBINUR Negative 05/28/2014 0855   KETONESUR NEGATIVE 07/09/2021 0857   PROTEINUR NEGATIVE 07/09/2021 0857   NITRITE NEGATIVE 07/09/2021 0857   LEUKOCYTESUR NEGATIVE 07/09/2021 0857   LEUKOCYTESUR Negative 05/28/2014 0855   Sepsis Labs: '@LABRCNTIP'$ (procalcitonin:4,lacticidven:4)  ) Recent Results (from the past 240 hour(s))  Resp Panel by RT-PCR (Flu A&B, Covid) Nasopharyngeal Swab     Status: None    Collection Time: 07/08/21 10:27 PM   Specimen: Nasopharyngeal Swab; Nasopharyngeal(NP) swabs in vial transport medium  Result Value Ref Range Status   SARS Coronavirus 2 by RT PCR NEGATIVE NEGATIVE Final    Comment: (NOTE) SARS-CoV-2 target nucleic acids are NOT DETECTED.  The SARS-CoV-2 RNA is generally detectable in upper respiratory specimens during the acute phase of infection. The lowest concentration of SARS-CoV-2 viral copies this assay can detect is 138 copies/mL. A negative result does not preclude SARS-Cov-2 infection and should not be used as the sole basis for treatment or other patient management decisions. A negative result may occur with  improper specimen collection/handling, submission of specimen other than nasopharyngeal swab, presence of viral mutation(s) within the areas targeted by this assay, and inadequate number of viral copies(<138 copies/mL). A negative result must be combined with clinical observations, patient history, and epidemiological information. The expected result is Negative.  Fact Sheet for Patients:  EntrepreneurPulse.com.au  Fact Sheet for Healthcare Providers:  IncredibleEmployment.be  This test is no t yet approved or cleared by the [place] FDA and  has been authorized for detection and/or diagnosis of SARS-CoV-2 by FDA under an Emergency Use Authorization (EUA). This EUA will remain  in effect (meaning this test can be used) for the duration of the COVID-19 declaration under Section 564(b)(1) of the Act, 21 U.S.C.section 360bbb-3(b)(1), unless the authorization is terminated  or revoked sooner.       Influenza A by PCR NEGATIVE NEGATIVE Final   Influenza B by PCR NEGATIVE NEGATIVE Final    Comment: (NOTE) The Xpert Xpress SARS-CoV-2/FLU/RSV plus assay is intended as an aid in the diagnosis of influenza from Nasopharyngeal swab specimens and should not be used as a sole basis for treatment.  Nasal washings and aspirates are unacceptable for Xpert Xpress SARS-CoV-2/FLU/RSV testing.  Fact Sheet for Patients: EntrepreneurPulse.com.au  Fact Sheet for Healthcare Providers: IncredibleEmployment.be  This test is not yet approved or cleared by the [place] FDA and has been authorized for detection and/or diagnosis of SARS-CoV-2 by FDA under an Emergency Use Authorization (EUA). This EUA will remain in effect (meaning this test can be used) for the duration of the COVID-19 declaration under Section 564(b)(1) of the Act, 21 U.S.C. section 360bbb-3(b)(1), unless the authorization is terminated or revoked.  Performed at [facility], [address] [place], [place] [address]   Blood culture (routine single)     Status: None (Preliminary result)   Collection Time: 07/08/21 11:04 PM   Specimen: BLOOD  Result Value Ref Range Status   Specimen Description BLOOD LEFT ANTECUBITAL  Final   Special Requests   Final    BOTTLES DRAWN AEROBIC AND ANAEROBIC Blood Culture adequate volume   Culture   Final    NO GROWTH 2 DAYS Performed at [facility], [address], [place], [place] [address]    Report Status PENDING  Incomplete         Radiology Studies: CT CHEST WO CONTRAST  Result Date: 07/09/2021 CLINICAL DATA:  Abnormal x-ray with pleural effusion. Shortness of breath today. EXAM: CT CHEST WITHOUT CONTRAST TECHNIQUE: Multidetector CT imaging of the chest was performed following the standard protocol without IV contrast. COMPARISON:  06/26/2020.  Chest radiograph 07/08/2021 FINDINGS: Cardiovascular: Mild cardiac enlargement. No pericardial effusion. Calcification in the mitral valve annulus, coronary arteries, and aorta. 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 Aneurysm of the lower descending thoracic aorta with maximal AP diameter of about 5.5 cm. Appearance is  similar to previous study. Mediastinum/Nodes: Thyroid gland is unremarkable. Moderately prominent mediastinal lymph nodes with pretracheal nodes measuring up to about 1.8 cm short axis dimension. Esophagus is decompressed. Lungs/Pleura: Small right pleural effusion. Significant volume loss in the right lung with heterogeneous consolidation and air bronchograms throughout the right lung but most prominent in the right middle and right lower lungs. Minimal residual aeration in the right upper lung. There is a cut off sign to the right bronchus intermedius which may indicate obstructing mass or endobronchial process. The left lung demonstrates diffuse emphysematous changes with peripheral fibrosis and multiple pulmonary nodules which have developed since the prior study. Overall, changes are likely to represent a right hilar or endobronchial mass causing postobstructive change on the right and metastatic disease throughout both lungs. Upper Abdomen: Prominent vascular calcifications. No acute process identified in the visualized upper abdomen. Musculoskeletal: Degenerative changes in the spine. No destructive bone lesions. IMPRESSION: 1. Collapse and consolidation of the right lung with suspicion of obstructing lesion involving the right bronchus intermedius. This is likely to represent primary lung cancer. 2. Diffuse pulmonary nodule seen best in the aerated left lung, likely metastatic disease. 3. Mediastinal lymphadenopathy is likely metastatic. 4. Underlying emphysematous and chronic bronchitic changes in the lungs with slight peripheral fibrosis. 5. 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 6. Diffuse aortic atherosclerosis. Electronically Signed   By: Lucienne Capers M.D.   On: 07/09/2021 01:23   DG Chest Portable 1 View  Result Date: 07/08/2021 CLINICAL DATA:  feeling short of breath today Best films possible due to pt condition. CHf/COPd exacerbation.e val pulm congestion/infiltrate  EXAM: PORTABLE CHEST 1 VIEW COMPARISON:  Chest x-ray 06/26/2020 FINDINGS: The heart and mediastinal contours are grossly unchanged. Aortic calcification. Right lung pulmonary staples noted. Almost complete opacification of the right hemithorax. Increased interstitial markings bilaterally. Likely some component of right pleural effusion. No left pleural effusion. No pneumothorax. No acute osseous abnormality. IMPRESSION: Almost complete opacification of the right hemithorax as well as increased interstitial markings bilaterally. Likely some component of right pleural effusion. Findings suggestive of infection/inflammation. Underlying malignancy not excluded. Recommend CT chest with intravenous contrast for further evaluation. Electronically Signed   By: Iven Finn M.D.   On: 07/08/2021 22:48   ECHOCARDIOGRAM COMPLETE  Result Date: 07/09/2021    ECHOCARDIOGRAM REPORT   Patient Name:   VENISHA GILLION Date of Exam: 07/09/2021 Medical Rec #:  [id number]    Height:       61.0 in Accession #:    [id number]   Weight:  116.0 lb Date of Birth:  1941/03/13    BSA:          1.498 m Patient Age:    36 years     BP:           114/58 mmHg Patient Gender: F            HR:           116 bpm. Exam Location:  [facility] Procedure: 2D Echo, Cardiac Doppler and Color Doppler Indications:     CHF-Acute Diastolic [id number]  History:         Patient has prior history of Echocardiogram examinations. CHF,                  CAD; Risk Factors:Hypertension.  Sonographer:     Alyse Low Roar Referring Phys:  [id number] Athena Masse Diagnosing Phys: Isaias Cowman MD IMPRESSIONS  1. Left ventricular ejection fraction, by estimation, is 55 to 60%. The left ventricle has normal function. The left ventricle has no regional wall motion abnormalities. Left ventricular diastolic parameters are indeterminate.  2. Right ventricular systolic function is normal. The right ventricular size is normal.  3. The mitral valve is normal in structure. Mild mitral  valve regurgitation. No evidence of mitral stenosis.  4. Tricuspid valve regurgitation is moderate.  5. The aortic valve is normal in structure. Aortic valve regurgitation is not visualized. No aortic stenosis is present.  6. The inferior vena cava is normal in size with greater than 50% respiratory variability, suggesting right atrial pressure of 3 mmHg. FINDINGS  Left Ventricle: Left ventricular ejection fraction, by estimation, is 55 to 60%. The left ventricle has normal function. The left ventricle has no regional wall motion abnormalities. The left ventricular internal cavity size was normal in size. There is  no left ventricular hypertrophy. Left ventricular diastolic parameters are indeterminate. Right Ventricle: The right ventricular size is normal. No increase in right ventricular wall thickness. Right ventricular systolic function is normal. Left Atrium: Left atrial size was normal in size. Right Atrium: Right atrial size was normal in size. Pericardium: There is no evidence of pericardial effusion. Mitral Valve: The mitral valve is normal in structure. Mild mitral valve regurgitation. No evidence of mitral valve stenosis. Tricuspid Valve: The tricuspid valve is normal in structure. Tricuspid valve regurgitation is moderate . No evidence of tricuspid stenosis. Aortic Valve: The aortic valve is normal in structure. Aortic valve regurgitation is not visualized. No aortic stenosis is present. Aortic valve peak gradient measures 7.1 mmHg. Pulmonic Valve: The pulmonic valve was normal in structure. Pulmonic valve regurgitation is not visualized. No evidence of pulmonic stenosis. Aorta: The aortic root is normal in size and structure. Venous: The inferior vena cava is normal in size with greater than 50% respiratory variability, suggesting right atrial pressure of 3 mmHg. IAS/Shunts: No atrial level shunt detected by color flow Doppler.  LEFT VENTRICLE PLAX 2D LVIDd:         4.10 cm  Diastology LVIDs:          2.90 cm  LV e' medial:    9.03 cm/s LV PW:         1.10 cm  LV E/e' medial:  17.7 LV IVS:        1.20 cm  LV e' lateral:   6.31 cm/s LVOT diam:     1.70 cm  LV E/e' lateral: 25.4 LVOT Area:     2.27 cm  RIGHT VENTRICLE RV Basal diam:  2.40  cm RV Mid diam:    2.10 cm RV S prime:     12.20 cm/s TAPSE (M-mode): 1.5 cm LEFT ATRIUM             Index       RIGHT ATRIUM           Index LA diam:        3.70 cm 2.47 cm/m  RA Area:     14.30 cm LA Vol (A2C):   41.2 ml 27.50 ml/m RA Volume:   32.30 ml  21.56 ml/m LA Vol (A4C):   31.9 ml 21.29 ml/m LA Biplane Vol: 36.7 ml 24.49 ml/m  AORTIC VALVE                PULMONIC VALVE AV Area (Vmax): 1.93 cm    PV Vmax:        1.22 m/s AV Vmax:        133.00 cm/s PV Peak grad:   6.0 mmHg AV Peak Grad:   7.1 mmHg    RVOT Peak grad: 2 mmHg LVOT Vmax:      113.00 cm/s  AORTA Ao Root diam: 2.70 cm MITRAL VALVE                TRICUSPID VALVE MV Area (PHT): 6.83 cm     TR Peak grad:   35.0 mmHg MV Decel Time: 111 msec     TR Vmax:        296.00 cm/s MV E velocity: 160.00 cm/s                             SHUNTS                             Systemic Diam: 1.70 cm Isaias Cowman MD Electronically signed by Isaias Cowman MD Signature Date/Time: 07/09/2021/1:24:30 PM    Final         Scheduled Meds:  atorvastatin  40 mg Oral Daily   docusate sodium  100 mg Oral Daily   enoxaparin (LOVENOX) injection  40 mg Subcutaneous [id number]   folic acid  1 mg Oral Daily   furosemide  20 mg Oral Daily   metoprolol tartrate  25 mg Oral BID   multivitamin with minerals  1 tablet Oral Daily   potassium chloride  40 mEq Oral Daily   predniSONE  40 mg Oral Q breakfast   thiamine  100 mg Oral Daily   Or   thiamine  100 mg Intravenous Daily   Continuous Infusions:  azithromycin Stopped (07/10/21 0200)   cefTRIAXone (ROCEPHIN)  IV Stopped (07/09/21 2350)     LOS: 1 day    Time spent: 35 min    Desma Maxim, MD [facility]   If 7PM-7AM, please contact  night-coverage [email or web address] Password [facility] 07/10/2021, 9:18 AM

## 2021-07-10 NOTE — Progress Notes (Signed)
Pulmonary Medicine          Date: 07/10/2021,   MRN# [id number] Felicia Sellers 09-25-1941      HISTORY OF PRESENT ILLNESS  This is an 80 yr old lady came in in respiratory failure ( hypoxia). She is known to have had a diagnosis of lung cancer, status post upper lobectomy in 2004. On w/u she is noted to have on chest ct:  today more alert, no further demise 1. Collapse and consolidation of the right lung with suspicion of obstructing lesion involving the right bronchus intermedius. This is likely to represent primary lung cancer. 2. Diffuse pulmonary nodule seen best in the aerated left lung, likely metastatic disease. 3. Mediastinal lymphadenopathy is likely metastatic. 4. Underlying emphysematous and chronic bronchitic changes in the lungs with slight peripheral fibrosis. 5. 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 6. Diffuse aortic atherosclerosis.   IMPRESSION: chest angio earlier No evidence of pulmonary embolus.   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   Patchy airspace disease within the right lower lobe concerning for pneumonia. Recommend follow-up after treatment to ensure resolution.   Cardiomegaly, coronary artery disease.   Aortic Atherosclerosis (ICD10-I70.0) and Emphysema (ICD10-J43.9).    Electronically Signed   By: Rolm Baptise M.D.   On: 06/27/2020 00:11   COMPARISON:  PET scan of August 22, 2018. CT scan of July 24, 2018.   FINDINGS: Cardiovascular: Severe atherosclerosis of thoracic aorta is noted with tortuosity of the proximal descending thoracic aorta. Proximal descending thoracic aorta measures 3.8 cm in diameter. Distal portion of transverse aortic arch measures 3.9 cm. 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 Severe stenosis is noted at the origin of the left common carotid artery. No pericardial effusion is noted. Coronary artery calcifications are noted.    Mediastinum/Nodes: Small right thyroid nodules are noted. Stable mediastinal adenopathy is noted as described on prior PET scan. Esophagus is unremarkable.   Lungs/Pleura: No pneumothorax or pleural effusion is noted. Mild emphysematous disease is noted in both upper lobes. Mild scarring is seen throughout both lungs. No consolidative process is noted. Stable 4 mm nodule is noted in right lower lobe best seen on image number 28 of series 5.   Upper Abdomen: No acute abnormality.   Musculoskeletal: No chest wall abnormality. No acute or significant osseous findings.   Review of the MIP images confirms the above findings.   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 Recommend semi-annual imaging followup by CTA or MRA and referral to cardiothoracic surgery if not already obtained. This recommendation follows 2010 ACCF/AHA/AATS/ACR/ASA/SCA/SCAI/SIR/STS/SVM Guidelines for the Diagnosis and Management of Patients With Thoracic Aortic Disease. Circulation. 2010; [id number].   Severe stenosis noted at origin of right common carotid artery.   Stable mediastinal adenopathy is noted compared to prior PET scan.   Coronary artery calcifications are noted.   Stable 4 mm nodule is noted in right lower lobe compared to prior PET scan.   Aortic Atherosclerosis (ICD10-I70.0) and Emphysema (ICD10-J43.9).     Electronically Signed   By: Marijo Conception, M.D.   On: 09/17/2018 17:14      PAST MEDICAL HISTORY   Past Medical History:  Diagnosis Date   Cancer [facility])    right lung removed - lung cancer 1/3 lung removed    CHF (congestive heart failure) (HCC)    COPD (chronic obstructive pulmonary disease) (HCC)    Coronary artery disease  History of stomach ulcers    Hypertension    Pneumonia      SURGICAL HISTORY   Past Surgical History:  Procedure Laterality Date   LUNG REMOVAL, PARTIAL Left      FAMILY HISTORY   Family History  Problem Relation Age  of Onset   Breast cancer Neg Hx      SOCIAL HISTORY   Social History   Tobacco Use   Smoking status: Every Day    Packs/day: 1.00    Years: 66.00    Pack years: 66.00    Types: Cigarettes    Last attempt to quit: 06/26/2020    Years since quitting: 1.0   Smokeless tobacco: Never  Vaping Use   Vaping Use: Never used  Substance Use Topics   Alcohol use: Not Currently    Comment: use to drink alot ( vodka) 6 months ago   Drug use: Never     MEDICATIONS    Home Medication:    Current Medication:  Current Facility-Administered Medications:    acetaminophen (TYLENOL) tablet 650 mg, 650 mg, Oral, Q6H PRN **OR** acetaminophen (TYLENOL) suppository 650 mg, 650 mg, Rectal, Q6H PRN, Athena Masse, MD   atorvastatin (LIPITOR) tablet 40 mg, 40 mg, Oral, Daily, Wouk, Ailene Rud, MD, 40 mg at 07/10/21 1201   azithromycin (ZITHROMAX) 500 mg in sodium chloride 0.9 % 250 mL IVPB, 500 mg, Intravenous, Q24H, Wouk, Ailene Rud, MD, Stopped at 07/10/21 0200   cefTRIAXone (ROCEPHIN) 2 g in sodium chloride 0.9 % 100 mL IVPB, 2 g, Intravenous, Q24H, Vladimir Crofts, MD, Stopped at 07/09/21 2350   docusate sodium (COLACE) capsule 100 mg, 100 mg, Oral, Daily, Wouk, Ailene Rud, MD, 100 mg at 07/10/21 1201   enoxaparin (LOVENOX) injection 40 mg, 40 mg, Subcutaneous, Q24H, Athena Masse, MD, 40 mg at [id number]   folic acid (FOLVITE) tablet 1 mg, 1 mg, Oral, Daily, Wouk, Ailene Rud, MD, 1 mg at 07/10/21 1201   furosemide (LASIX) tablet 20 mg, 20 mg, Oral, Daily, Wouk, Ailene Rud, MD, 20 mg at 07/10/21 1159   influenza vaccine adjuvanted (FLUAD) injection 0.5 mL, 0.5 mL, Intramuscular, Prior to discharge, Wouk, Ailene Rud, MD   ipratropium-albuterol (DUONEB) 0.5-2.5 (3) MG/3ML nebulizer solution 3 mL, 3 mL, Nebulization, Q4H PRN, Athena Masse, MD   LORazepam (ATIVAN) tablet 1-4 mg, 1-4 mg, Oral, Q1H PRN **OR** LORazepam (ATIVAN) injection 1-4 mg, 1-4 mg, Intravenous, Q1H PRN, Wouk, Ailene Rud, MD   [START ON 07/11/2021] metoprolol tartrate (LOPRESSOR) tablet 50 mg, 50 mg, Oral, BID, Wouk, Ailene Rud, MD   multivitamin with minerals tablet 1 tablet, 1 tablet, Oral, Daily, Wouk, Ailene Rud, MD, 1 tablet at 07/10/21 1202   ondansetron (ZOFRAN) tablet 4 mg, 4 mg, Oral, Q6H PRN **OR** ondansetron (ZOFRAN) injection 4 mg, 4 mg, Intravenous, Q6H PRN, Athena Masse, MD   potassium chloride SA (KLOR-CON) CR tablet 40 mEq, 40 mEq, Oral, Daily, Wouk, Ailene Rud, MD, 40 mEq at 07/10/21 1201   predniSONE (DELTASONE) tablet 40 mg, 40 mg, Oral, Q breakfast, Wouk, Ailene Rud, MD, 40 mg at 07/10/21 0736   thiamine tablet 100 mg, 100 mg, Oral, Daily **OR** thiamine (B-1) injection 100 mg, 100 mg, Intravenous, Daily, Wouk, Ailene Rud, MD, 100 mg at 07/10/21 1205    ALLERGIES   Aspirin     REVIEW OF SYSTEMS    Review of Systems:  Gen:  Denies  fever, sweats, chills weigh loss  HEENT: Denies blurred vision, double  vision, ear pain, eye pain, hearing loss, nose bleeds, sore throat Cardiac:  No dizziness, chest pain or heaviness, chest tightness,edema Resp:   + cough or sputum porduction, shortness of breath,wheezing, hemoptysis,  Gi: Denies swallowing difficulty, stomach pain, nausea or vomiting, diarrhea, constipation, bowel incontinence Gu:  Denies bladder incontinence, burning urine Ext:   Denies Joint pain, stiffness or swelling Skin: Denies  skin rash, easy bruising or bleeding or hives Endoc:  Denies polyuria, polydipsia , polyphagia or weight change Psych:   Denies depression, insomnia or hallucinations   Other:  All other systems negative   VS: BP 134/76 (BP Location: Left Arm)   Pulse 80   Temp 98.1 F (36.7 C)   Resp 17   Ht '5\' 1"'$  (1.549 m)   Wt 52.6 kg   SpO2 97%   BMI 21.92 kg/m      PHYSICAL EXAM    GENERAL:NAD, no fevers, chills, no weakness no fatigue HEAD: Normocephalic, atraumatic.  EYES: Pupils equal, round, reactive to light.  Extraocular muscles intact. No scleral icterus.  MOUTH: Moist mucosal membrane. Dentition intact. No abscess noted.  EAR, NOSE, THROAT: Clear without exudates. No external lesions.  NECK: Supple. No thyromegaly. No nodules. No JVD.  PULMONARY: Diffuse coarse rhonchi right sided +wheezes CARDIOVASCULAR: S1 and S2. Regular rate and rhythm. No murmurs, rubs, or gallops. No edema. Pedal pulses 2+ bilaterally.  GASTROINTESTINAL: Soft, nontender, nondistended. No masses. Positive bowel sounds. No hepatosplenomegaly.  MUSCULOSKELETAL: No swelling, clubbing, or edema. Range of motion full in all extremities.  NEUROLOGIC: Cranial nerves II through XII except hard of hearing. No gross focal neurological deficits. Sensation intact. Reflexes intact.  SKIN: No ulceration, lesions, rashes, or cyanosis. Skin warm and dry. Turgor intact.  PSYCHIATRIC: Mood, affect within normal limits. The patient is awake, alert and oriented x 3. Insight, judgment intact.       IMAGING    CT CHEST WO CONTRAST  Result Date: 07/09/2021 CLINICAL DATA:  Abnormal x-ray with pleural effusion. Shortness of breath today. EXAM: CT CHEST WITHOUT CONTRAST TECHNIQUE: Multidetector CT imaging of the chest was performed following the standard protocol without IV contrast. COMPARISON:  06/26/2020.  Chest radiograph 07/08/2021 FINDINGS: Cardiovascular: Mild cardiac enlargement. No pericardial effusion. Calcification in the mitral valve annulus, coronary arteries, and aorta. 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 Aneurysm of the lower descending thoracic aorta with maximal AP diameter of about 5.5 cm. Appearance is similar to previous study. Mediastinum/Nodes: Thyroid gland is unremarkable. Moderately prominent mediastinal lymph nodes with pretracheal nodes measuring up to about 1.8 cm short axis dimension. Esophagus is decompressed. Lungs/Pleura: Small right pleural effusion.  Significant volume loss in the right lung with heterogeneous consolidation and air bronchograms throughout the right lung but most prominent in the right middle and right lower lungs. Minimal residual aeration in the right upper lung. There is a cut off sign to the right bronchus intermedius which may indicate obstructing mass or endobronchial process. The left lung demonstrates diffuse emphysematous changes with peripheral fibrosis and multiple pulmonary nodules which have developed since the prior study. Overall, changes are likely to represent a right hilar or endobronchial mass causing postobstructive change on the right and metastatic disease throughout both lungs. Upper Abdomen: Prominent vascular calcifications. No acute process identified in the visualized upper abdomen. Musculoskeletal: Degenerative changes in the spine. No destructive bone lesions. IMPRESSION: 1. Collapse and consolidation of the right lung with suspicion of obstructing lesion involving the right bronchus intermedius.  This is likely to represent primary lung cancer. 2. Diffuse pulmonary nodule seen best in the aerated left lung, likely metastatic disease. 3. Mediastinal lymphadenopathy is likely metastatic. 4. Underlying emphysematous and chronic bronchitic changes in the lungs with slight peripheral fibrosis. 5. 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 6. Diffuse aortic atherosclerosis. Electronically Signed   By: Lucienne Capers M.D.   On: 07/09/2021 01:23   MR BRAIN W WO CONTRAST  Result Date: 07/10/2021 CLINICAL DATA:  Hematologic malignancy, staging. EXAM: MRI HEAD WITHOUT AND WITH CONTRAST TECHNIQUE: Multiplanar, multiecho pulse sequences of the brain and surrounding structures were obtained without and with intravenous contrast. CONTRAST:  81m GADAVIST GADOBUTROL 1 MMOL/ML IV SOLN COMPARISON:  Head CT 05/28/2014 FINDINGS: Brain: No enhancement or swelling to suggest metastatic disease. No acute  infarction, hemorrhage, hydrocephalus, extra-axial collection or mass lesion. Chronic small vessel ischemia that is extensive in the hemispheric white matter and mild in the pons. Small remote bilateral cerebellar infarcts. Vascular: Normal flow voids. Skull and upper cervical spine: Normal marrow signal. Sinuses/Orbits: Negative. IMPRESSION: 1. No acute finding or evidence of metastatic disease. 2. Extensive chronic small vessel ischemia. Electronically Signed   By: JJorje GuildM.D.   On: 07/10/2021 11:39   DG Chest Portable 1 View  Result Date: 07/08/2021 CLINICAL DATA:  feeling short of breath today Best films possible due to pt condition. CHf/COPd exacerbation.e val pulm congestion/infiltrate EXAM: PORTABLE CHEST 1 VIEW COMPARISON:  Chest x-ray 06/26/2020 FINDINGS: The heart and mediastinal contours are grossly unchanged. Aortic calcification. Right lung pulmonary staples noted. Almost complete opacification of the right hemithorax. Increased interstitial markings bilaterally. Likely some component of right pleural effusion. No left pleural effusion. No pneumothorax. No acute osseous abnormality. IMPRESSION: Almost complete opacification of the right hemithorax as well as increased interstitial markings bilaterally. Likely some component of right pleural effusion. Findings suggestive of infection/inflammation. Underlying malignancy not excluded. Recommend CT chest with intravenous contrast for further evaluation. Electronically Signed   By: MIven FinnM.D.   On: 07/08/2021 22:48   ECHOCARDIOGRAM COMPLETE  Result Date: 07/09/2021    ECHOCARDIOGRAM REPORT   Patient Name:   Felicia HOEFLINGDate of Exam: 07/09/2021 Medical Rec #:  [id number]   Height:       61.0 in Accession #:    [id number]  Weight:       116.0 lb Date of Birth:  7May 15, 1942   BSA:          1.498 m Patient Age:    89years     BP:           114/58 mmHg Patient Gender: F            HR:           116 bpm. Exam Location:  [facility] Procedure:  2D Echo, Cardiac Doppler and Color Doppler Indications:     CHF-Acute Diastolic [id number] History:         Patient has prior history of Echocardiogram examinations. CHF,                  CAD; Risk Factors:Hypertension.  Sonographer:     CAlyse LowRoar Referring Phys:  [id number] MasseDiagnosing Phys: AIsaias CowmanMD IMPRESSIONS  1. Left ventricular ejection fraction, by estimation, is 55 to 60%. The left ventricle has normal function. The left ventricle has no regional wall motion abnormalities. Left ventricular diastolic parameters are indeterminate.  2. Right ventricular systolic function  is normal. The right ventricular size is normal.  3. The mitral valve is normal in structure. Mild mitral valve regurgitation. No evidence of mitral stenosis.  4. Tricuspid valve regurgitation is moderate.  5. The aortic valve is normal in structure. Aortic valve regurgitation is not visualized. No aortic stenosis is present.  6. The inferior vena cava is normal in size with greater than 50% respiratory variability, suggesting right atrial pressure of 3 mmHg. FINDINGS  Left Ventricle: Left ventricular ejection fraction, by estimation, is 55 to 60%. The left ventricle has normal function. The left ventricle has no regional wall motion abnormalities. The left ventricular internal cavity size was normal in size. There is  no left ventricular hypertrophy. Left ventricular diastolic parameters are indeterminate. Right Ventricle: The right ventricular size is normal. No increase in right ventricular wall thickness. Right ventricular systolic function is normal. Left Atrium: Left atrial size was normal in size. Right Atrium: Right atrial size was normal in size. Pericardium: There is no evidence of pericardial effusion. Mitral Valve: The mitral valve is normal in structure. Mild mitral valve regurgitation. No evidence of mitral valve stenosis. Tricuspid Valve: The tricuspid valve is normal in structure. Tricuspid valve  regurgitation is moderate . No evidence of tricuspid stenosis. Aortic Valve: The aortic valve is normal in structure. Aortic valve regurgitation is not visualized. No aortic stenosis is present. Aortic valve peak gradient measures 7.1 mmHg. Pulmonic Valve: The pulmonic valve was normal in structure. Pulmonic valve regurgitation is not visualized. No evidence of pulmonic stenosis. Aorta: The aortic root is normal in size and structure. Venous: The inferior vena cava is normal in size with greater than 50% respiratory variability, suggesting right atrial pressure of 3 mmHg. IAS/Shunts: No atrial level shunt detected by color flow Doppler.  LEFT VENTRICLE PLAX 2D LVIDd:         4.10 cm  Diastology LVIDs:         2.90 cm  LV e' medial:    9.03 cm/s LV PW:         1.10 cm  LV E/e' medial:  17.7 LV IVS:        1.20 cm  LV e' lateral:   6.31 cm/s LVOT diam:     1.70 cm  LV E/e' lateral: 25.4 LVOT Area:     2.27 cm  RIGHT VENTRICLE RV Basal diam:  2.40 cm RV Mid diam:    2.10 cm RV S prime:     12.20 cm/s TAPSE (M-mode): 1.5 cm LEFT ATRIUM             Index       RIGHT ATRIUM           Index LA diam:        3.70 cm 2.47 cm/m  RA Area:     14.30 cm LA Vol (A2C):   41.2 ml 27.50 ml/m RA Volume:   32.30 ml  21.56 ml/m LA Vol (A4C):   31.9 ml 21.29 ml/m LA Biplane Vol: 36.7 ml 24.49 ml/m  AORTIC VALVE                PULMONIC VALVE AV Area (Vmax): 1.93 cm    PV Vmax:        1.22 m/s AV Vmax:        133.00 cm/s PV Peak grad:   6.0 mmHg AV Peak Grad:   7.1 mmHg    RVOT Peak grad: 2 mmHg LVOT Vmax:      113.00  cm/s  AORTA Ao Root diam: 2.70 cm MITRAL VALVE                TRICUSPID VALVE MV Area (PHT): 6.83 cm     TR Peak grad:   35.0 mmHg MV Decel Time: 111 msec     TR Vmax:        296.00 cm/s MV E velocity: 160.00 cm/s                             SHUNTS                             Systemic Diam: 1.70 cm Isaias Cowman MD Electronically signed by Isaias Cowman MD Signature Date/Time: 07/09/2021/1:24:30 PM     Final       ASSESSMENT/PLAN   This is a pleasant 80 yr old lady here in respiratory failure, known copd, acute on chronic diastolic heart failure, echo suggestive of secondary pulmonary hypertension and afib. Cardiology has seen, recommendations noted. More alert today   Unfortunately looks like she has a mallgnant obstructive right bronchus intermedius lesion with distal collapse. Bronchoscopy is warranted.  Oncology notoified Out pet pet scan Mri of head    Thank you for allowing me to participate in the care of this patient.   Patient/Family are satisfied with care plan and all questions have been answered.  This document was prepared using Dragon voice recognition software and may include unintentional dictation errors.     Wallene Huh, M.D.  Division of [facility]

## 2021-07-11 LAB — BASIC METABOLIC PANEL
Anion gap: 7 (ref 5–15)
BUN: 18 mg/dL (ref 8–23)
CO2: 28 mmol/L (ref 22–32)
Calcium: 8.5 mg/dL — ABNORMAL LOW (ref 8.9–10.3)
Chloride: 95 mmol/L — ABNORMAL LOW (ref 98–111)
Creatinine, Ser: 0.64 mg/dL (ref 0.44–1.00)
GFR, Estimated: >60 mL/min (ref >60)
Glucose, Bld: 72 mg/dL (ref 70–99)
Potassium: 3.7 mmol/L (ref 3.5–5.1)
Sodium: 130 mmol/L — ABNORMAL LOW (ref 135–145)

## 2021-07-11 LAB — URINE CULTURE: Culture: <10000 — AB

## 2021-07-11 LAB — CBC
HCT: 38.6 % (ref 36.0–46.0)
Hemoglobin: 13.1 g/dL (ref 12.0–15.0)
MCH: 31.2 pg (ref 26.0–34.0)
MCHC: 33.9 g/dL (ref 30.0–36.0)
MCV: 91.9 fL (ref 80.0–100.0)
Platelets: 311 10*3/uL (ref 150–400)
RBC: 4.2 MIL/uL (ref 3.87–5.11)
RDW: 13.2 % (ref 11.5–15.5)
WBC: 14.4 10*3/uL — ABNORMAL HIGH (ref 4.0–10.5)
nRBC: 0 % (ref 0.0–0.2)

## 2021-07-11 LAB — CRYPTOCOCCAL ANTIGEN: Crypto Ag: NEGATIVE

## 2021-07-11 LAB — SEDIMENTATION RATE: Sed Rate: 17 mm/hr (ref 0–30)

## 2021-07-11 LAB — MRSA NEXT GEN BY PCR, NASAL: MRSA by PCR Next Gen: NOT DETECTED

## 2021-07-11 LAB — C-REACTIVE PROTEIN: CRP: 8.1 mg/dL — ABNORMAL HIGH (ref <1.0)

## 2021-07-11 LAB — MAGNESIUM: Magnesium: 2 mg/dL (ref 1.7–2.4)

## 2021-07-11 LAB — PROCALCITONIN: Procalcitonin: <0.1 ng/mL

## 2021-07-11 MED ORDER — PIPERACILLIN-TAZOBACTAM 3.375 G IVPB
3.3750 g | Freq: Three times a day (TID) | INTRAVENOUS | Status: DC
  Administered 2021-07-11 – 2021-07-13 (×5): 3.375 g via INTRAVENOUS
  Filled 2021-07-11 (×5): qty 50

## 2021-07-11 MED ORDER — METOPROLOL TARTRATE 25 MG PO TABS
12.5000 mg | ORAL_TABLET | Freq: Two times a day (BID) | ORAL | Status: DC
  Administered 2021-07-11 – 2021-07-13 (×4): 12.5 mg via ORAL
  Filled 2021-07-11 (×4): qty 1

## 2021-07-11 MED ORDER — SODIUM CHLORIDE 1 G PO TABS
2.0000 g | ORAL_TABLET | Freq: Three times a day (TID) | ORAL | Status: DC
  Administered 2021-07-12 – 2021-07-13 (×5): 2 g via ORAL
  Filled 2021-07-11 (×8): qty 2

## 2021-07-11 MED ORDER — PREDNISONE 20 MG PO TABS
20.0000 mg | ORAL_TABLET | Freq: Every day | ORAL | Status: DC
  Administered 2021-07-12 – 2021-07-13 (×2): 20 mg via ORAL
  Filled 2021-07-11 (×2): qty 1

## 2021-07-11 NOTE — Progress Notes (Signed)
PROGRESS NOTE    Felicia Sellers  QQI:[id number] DOB: 12/10/1940 DOA: 07/08/2021 PCP: [facility]  Outpatient Specialists: oncology, cardiology    Brief Narrative:  From admission hpi: [place] Sellers is a 80 y.o. female with medical history significant for Right lung cancer status post lobectomy, HTN, COPD, CHF, CAD, chronic hyponatremia who presents to the ED with shortness of breath, cough, muscle aches and generalized weakness.  She denied fever or chills.  She was brought in by EMS who recorded O2 sat of 87 on room air and she was placed on O2 via nasal cannula and treated with DuoNeb in route.  History taken from ER provider as patient was lethargic by the time of my assessment and unable to contribute to history   Assessment & Plan:   Principal Problem:   Acute respiratory failure with hypoxia ([place]) Active Problems:   Chronic hyponatremia   Hypertension   Community acquired pneumonia   Coronary artery disease involving native coronary artery of native heart without angina pectoris   COPD, moderate ([place])   Personal history of lung cancer   CAP (community acquired pneumonia)   Obstructive pneumonia    Acute respiratory failure with hypoxia ([place]) O2 sat was 87% on room air with EMS with no prior history of home O2 use. Suspect related to postobstructive pneumonia with some element of heart failure exacerbation. On 6 L HFNC currently. Per nephew patient has o2 requirement, previously prescribed, she declined it.  - Continue O2 to keep sats over 90 and wean as tolerated - Treat etiology   Postobstructive pneumonia? Personal history of right lung cancer s/p lobectomy History copd Patient with cough and shortness of breath and myalgias and leukocytosis of 16,00. CT chest without contrast collapse and consolidation of the right lung with suspicion of obstructing lesion involving the right bronchus intermedius.  This is likely to represent primary lung cancer.  Mediastinal  lymphadenopathy likely metastatic, underlying emphysematous and chronic bronchitic changes. Procalcitonin low, this is less likely infectious - will stop abx today - continue prednisone, given hx copd - Mucolytic's - Supplemental O2 - pulmonology planning bronchoscopy early this coming week. Have asked him about timing and to also discuss w/ patient as she has many questions - palliative care consult as well, patient not sure she wants to treat this - discussed w/ dr. Janese Banks of oncology, will need f/u with her as outpt, outpt PET. Requests mri of brain w/wo contrast which shows no acute process - PT advising SNF, bed search underway   Chronic diastolic heart failure Bnp moderately elevated to 200s. Received lasix in ed. Does not appear to be significantly fluid overload. TTE w/o significant cardiac dysfunction - cont metop - added lasix 20 - cardiology following  Paroxysmal a fib New a fib here, likely 2/2 pulmonary process. Rvr yesterday, now improved. Tsh wnl - home metop increased to tartrate 50 bid - hold anticoagulation given malignancy, plan for bronchoscopy - TTE w/o sig abnormality  Hypokalemia Resolved w/ repletion  Alcohol use History very heavy drinking, pt reports last several years 1-2 drinks per day. No s/s withdrawal - monitor on ciwa   Acute metabolic encephalopathy Dementia Encephalopathy resolved. Presbycusis contributes - hold gabapentin   Chronic hyponatremia Likely 2/2 pulmonary process. Sodium 130.  Baseline 129-132 - Continue to monitor    Hypertension Here bp low normal - hold home amlod and lisin   History NICM - Continue atorvastatin and metoprolol - Patient denied chest pain and EKG nonacute.  Troponin  14/8       COPD, moderate (HCC) - DuoNebs as needed   DVT prophylaxis: lovenox Code Status: full Family Communication: friend (listed as nephew but not a blood relation; she lives with him) updated telephonically 9/18. she has no immediate  family  Level of care: Progressive Cardiac Status is: Inpatient  Remains inpatient appropriate because:Inpatient level of care appropriate due to severity of illness  Dispo: The patient is from: Home              Anticipated d/c is to: snf              Patient currently is not medically stable to d/c.   Difficult to place patient No     Consultants:  Pulmonology, cardiology  Procedures: none  Antimicrobials:  Azithromycin/ceftriaxone 9/16> 9/19   Subjective: Breathing stable, has some cough  Objective: Vitals:   07/10/21 1957 07/10/21 2312 07/11/21 0453 07/11/21 0740  BP: 113/75 122/70 128/78 125/75  Pulse: (!) 42 75 90 (!) 104  Resp: 18 19 18 18   Temp: (!) 97.3 F (36.3 C) 97.7 F (36.5 C) 97.7 F (36.5 C) 98.1 F (36.7 C)  TempSrc:      SpO2: 99% 98% 94% 97%  Weight:   45.1 kg   Height:        Intake/Output Summary (Last 24 hours) at 07/11/2021 1027 Last data filed at 07/11/2021 0940 Gross per 24 hour  Intake 930 ml  Output --  Net 930 ml   Filed Weights   07/08/21 2159 07/11/21 0453  Weight: 52.6 kg 45.1 kg    Examination:  General exam: Appears calm and comfortable. Chronically ill appearing Respiratory system: diffuse scattered rhonchi, decreased breath sounds in bases Cardiovascular system: S1 & S2 heard, RRR. No JVD, murmurs, rubs, gallops or clicks. No pedal edema. Gastrointestinal system: Abdomen is nondistended, soft and nontender. No organomegaly or masses felt. Normal bowel sounds heard. Central nervous system: Alert, confused. Moving all 4 extremities Extremities: Symmetric 5 x 5 power. Skin: No rashes, lesions or ulcers Psychiatry: calm, confused    Data Reviewed: I have personally reviewed following labs and imaging studies  CBC: Recent Labs  Lab 07/08/21 2206 07/09/21 0725 07/10/21 0729 07/11/21 0444  WBC 15.9* 13.5* 18.5* 14.4*  NEUTROABS 12.3*  --   --   --   HGB 13.4 14.1 13.4 13.1  HCT 39.5 39.2 37.7 38.6  MCV 92.5  92.7 92.0 91.9  PLT 270 267 299 518   Basic Metabolic Panel: Recent Labs  Lab 07/09/21 0017 07/10/21 0729 07/11/21 0444  NA 129* 131* 130*  K 3.7 3.2* 3.7  CL 92* 93* 95*  CO2 24 29 28   GLUCOSE 100* 109* 72  BUN 12 15 18   CREATININE 0.75 0.62 0.64  CALCIUM 8.6* 8.6* 8.5*  MG  --  1.9 2.0   GFR: Estimated Creatinine Clearance: 38.6 mL/min (by C-G formula based on SCr of 0.64 mg/dL). Liver Function Tests: Recent Labs  Lab 07/09/21 0017  AST 14*  ALT 10  ALKPHOS 118  BILITOT 1.3*  PROT 7.1  ALBUMIN 3.7   No results for input(s): LIPASE, AMYLASE in the last 168 hours. No results for input(s): AMMONIA in the last 168 hours. Coagulation Profile: Recent Labs  Lab 07/08/21 2302  INR 1.1   Cardiac Enzymes: No results for input(s): CKTOTAL, CKMB, CKMBINDEX, TROPONINI in the last 168 hours. BNP (last 3 results) No results for input(s): PROBNP in the last 8760 hours. HbA1C: No results for  input(s): HGBA1C in the last 72 hours. CBG: No results for input(s): GLUCAP in the last 168 hours. Lipid Profile: No results for input(s): CHOL, HDL, LDLCALC, TRIG, CHOLHDL, LDLDIRECT in the last 72 hours. Thyroid Function Tests: Recent Labs    07/10/21 0729  TSH 0.673   Anemia Panel: No results for input(s): VITAMINB12, FOLATE, FERRITIN, TIBC, IRON, RETICCTPCT in the last 72 hours. Urine analysis:    Component Value Date/Time   COLORURINE YELLOW 07/09/2021 [place] 07/09/2021 0857   APPEARANCEUR Clear 05/28/2014 0855   LABSPEC 1.010 07/09/2021 0857   LABSPEC 1.013 05/28/2014 0855   PHURINE 5.5 07/09/2021 [place] 07/09/2021 0857   GLUCOSEU Negative 05/28/2014 0855   HGBUR NEGATIVE 07/09/2021 0857   BILIRUBINUR NEGATIVE 07/09/2021 0857   BILIRUBINUR Negative 05/28/2014 0855   KETONESUR NEGATIVE 07/09/2021 0857   PROTEINUR NEGATIVE 07/09/2021 0857   NITRITE NEGATIVE 07/09/2021 0857   LEUKOCYTESUR NEGATIVE 07/09/2021 0857   LEUKOCYTESUR  Negative 05/28/2014 0855   Sepsis Labs: @LABRCNTIP (procalcitonin:4,lacticidven:4)  ) Recent Results (from the past 240 hour(s))  Resp Panel by RT-PCR (Flu A&B, Covid) Nasopharyngeal Swab     Status: None   Collection Time: 07/08/21 10:27 PM   Specimen: Nasopharyngeal Swab; Nasopharyngeal(NP) swabs in vial transport medium  Result Value Ref Range Status   SARS Coronavirus 2 by RT PCR NEGATIVE NEGATIVE Final    Comment: (NOTE) SARS-CoV-2 target nucleic acids are NOT DETECTED.  The SARS-CoV-2 RNA is generally detectable in upper respiratory specimens during the acute phase of infection. The lowest concentration of SARS-CoV-2 viral copies this assay can detect is 138 copies/mL. A negative result does not preclude SARS-Cov-2 infection and should not be used as the sole basis for treatment or other patient management decisions. A negative result may occur with  improper specimen collection/handling, submission of specimen other than nasopharyngeal swab, presence of viral mutation(s) within the areas targeted by this assay, and inadequate number of viral copies(<138 copies/mL). A negative result must be combined with clinical observations, patient history, and epidemiological information. The expected result is Negative.  Fact Sheet for Patients:  EntrepreneurPulse.com.au  Fact Sheet for Healthcare Providers:  IncredibleEmployment.be  This test is no t yet approved or cleared by the [place] FDA and  has been authorized for detection and/or diagnosis of SARS-CoV-2 by FDA under an Emergency Use Authorization (EUA). This EUA will remain  in effect (meaning this test can be used) for the duration of the COVID-19 declaration under Section 564(b)(1) of the Act, 21 U.S.C.section 360bbb-3(b)(1), unless the authorization is terminated  or revoked sooner.       Influenza A by PCR NEGATIVE NEGATIVE Final   Influenza B by PCR NEGATIVE NEGATIVE Final     Comment: (NOTE) The Xpert Xpress SARS-CoV-2/FLU/RSV plus assay is intended as an aid in the diagnosis of influenza from Nasopharyngeal swab specimens and should not be used as a sole basis for treatment. Nasal washings and aspirates are unacceptable for Xpert Xpress SARS-CoV-2/FLU/RSV testing.  Fact Sheet for Patients: EntrepreneurPulse.com.au  Fact Sheet for Healthcare Providers: IncredibleEmployment.be  This test is not yet approved or cleared by the [place] FDA and has been authorized for detection and/or diagnosis of SARS-CoV-2 by FDA under an Emergency Use Authorization (EUA). This EUA will remain in effect (meaning this test can be used) for the duration of the COVID-19 declaration under Section 564(b)(1) of the Act, 21 U.S.C. section 360bbb-3(b)(1), unless the authorization is terminated or revoked.  Performed at [facility], [address] [place], [place] [address]   Blood culture (routine single)     Status: None (Preliminary result)   Collection Time: 07/08/21 11:04 PM   Specimen: BLOOD  Result Value Ref Range Status   Specimen Description BLOOD LEFT ANTECUBITAL  Final   Special Requests   Final    BOTTLES DRAWN AEROBIC AND ANAEROBIC Blood Culture adequate volume   Culture   Final    NO GROWTH 3 DAYS Performed at [facility], [address], [place], [place] [address]    Report Status PENDING  Incomplete  Urine Culture     Status: Abnormal   Collection Time: 07/09/21  8:57 AM   Specimen: Urine, Random  Result Value Ref Range Status   Specimen Description   Final    URINE, RANDOM Performed at [facility], [address] [place], [place] [address]    Special Requests   Final    NONE Performed at [facility], [address] [place], [place] [address]    Culture (A)  Final    <10,000 COLONIES/mL INSIGNIFICANT GROWTH Performed at [facility], [place] [address]  [place], [place] [address]    Report Status 07/11/2021 FINAL  Final         Radiology Studies: MR BRAIN W WO CONTRAST  Result Date: 07/10/2021 CLINICAL DATA:  Hematologic malignancy, staging. EXAM: MRI HEAD WITHOUT AND WITH CONTRAST TECHNIQUE: Multiplanar, multiecho pulse sequences of the brain and surrounding structures were obtained without and with intravenous contrast. CONTRAST:  85mL GADAVIST GADOBUTROL 1 MMOL/ML IV SOLN COMPARISON:  Head CT 05/28/2014 FINDINGS: Brain: No enhancement or swelling to suggest metastatic disease. No acute infarction, hemorrhage, hydrocephalus, extra-axial collection or mass lesion. Chronic small vessel ischemia that is extensive in the hemispheric white matter and mild in the pons. Small remote bilateral cerebellar infarcts. Vascular: Normal flow voids. Skull and upper cervical spine: Normal marrow signal. Sinuses/Orbits: Negative. IMPRESSION: 1. No acute finding or evidence of metastatic disease. 2. Extensive chronic small vessel ischemia. Electronically Signed   By: Jorje Guild M.D.   On: 07/10/2021 11:39   ECHOCARDIOGRAM COMPLETE  Result Date: 07/09/2021    ECHOCARDIOGRAM REPORT   Patient Name:   DAYJAH SELMAN Date of Exam: 07/09/2021 Medical Rec #:  [id number]    Height:       61.0 in Accession #:    [id number]   Weight:       116.0 lb Date of Birth:  August 31, 1941    BSA:          1.498 m Patient Age:    72 years     BP:           114/58 mmHg Patient Gender: F            HR:           116 bpm. Exam Location:  [facility] Procedure: 2D Echo, Cardiac Doppler and Color Doppler Indications:     CHF-Acute Diastolic G99.24  History:         Patient has prior history of Echocardiogram examinations. CHF,                  CAD; Risk Factors:Hypertension.  Sonographer:     Alyse Low Roar Referring Phys:  [id number] Athena Masse Diagnosing Phys: Isaias Cowman MD IMPRESSIONS  1. Left ventricular ejection fraction, by estimation, is 55 to 60%. The left ventricle has normal function. The  left ventricle has no regional wall motion  abnormalities. Left ventricular diastolic parameters are indeterminate.  2. Right ventricular systolic function is normal. The right ventricular size is normal.  3. The mitral valve is normal in structure. Mild mitral valve regurgitation. No evidence of mitral stenosis.  4. Tricuspid valve regurgitation is moderate.  5. The aortic valve is normal in structure. Aortic valve regurgitation is not visualized. No aortic stenosis is present.  6. The inferior vena cava is normal in size with greater than 50% respiratory variability, suggesting right atrial pressure of 3 mmHg. FINDINGS  Left Ventricle: Left ventricular ejection fraction, by estimation, is 55 to 60%. The left ventricle has normal function. The left ventricle has no regional wall motion abnormalities. The left ventricular internal cavity size was normal in size. There is  no left ventricular hypertrophy. Left ventricular diastolic parameters are indeterminate. Right Ventricle: The right ventricular size is normal. No increase in right ventricular wall thickness. Right ventricular systolic function is normal. Left Atrium: Left atrial size was normal in size. Right Atrium: Right atrial size was normal in size. Pericardium: There is no evidence of pericardial effusion. Mitral Valve: The mitral valve is normal in structure. Mild mitral valve regurgitation. No evidence of mitral valve stenosis. Tricuspid Valve: The tricuspid valve is normal in structure. Tricuspid valve regurgitation is moderate . No evidence of tricuspid stenosis. Aortic Valve: The aortic valve is normal in structure. Aortic valve regurgitation is not visualized. No aortic stenosis is present. Aortic valve peak gradient measures 7.1 mmHg. Pulmonic Valve: The pulmonic valve was normal in structure. Pulmonic valve regurgitation is not visualized. No evidence of pulmonic stenosis. Aorta: The aortic root is normal in size and structure. Venous: The inferior  vena cava is normal in size with greater than 50% respiratory variability, suggesting right atrial pressure of 3 mmHg. IAS/Shunts: No atrial level shunt detected by color flow Doppler.  LEFT VENTRICLE PLAX 2D LVIDd:         4.10 cm  Diastology LVIDs:         2.90 cm  LV e' medial:    9.03 cm/s LV PW:         1.10 cm  LV E/e' medial:  17.7 LV IVS:        1.20 cm  LV e' lateral:   6.31 cm/s LVOT diam:     1.70 cm  LV E/e' lateral: 25.4 LVOT Area:     2.27 cm  RIGHT VENTRICLE RV Basal diam:  2.40 cm RV Mid diam:    2.10 cm RV S prime:     12.20 cm/s TAPSE (M-mode): 1.5 cm LEFT ATRIUM             Index       RIGHT ATRIUM           Index LA diam:        3.70 cm 2.47 cm/m  RA Area:     14.30 cm LA Vol (A2C):   41.2 ml 27.50 ml/m RA Volume:   32.30 ml  21.56 ml/m LA Vol (A4C):   31.9 ml 21.29 ml/m LA Biplane Vol: 36.7 ml 24.49 ml/m  AORTIC VALVE                PULMONIC VALVE AV Area (Vmax): 1.93 cm    PV Vmax:        1.22 m/s AV Vmax:        133.00 cm/s PV Peak grad:   6.0 mmHg AV Peak Grad:   7.1 mmHg  RVOT Peak grad: 2 mmHg LVOT Vmax:      113.00 cm/s  AORTA Ao Root diam: 2.70 cm MITRAL VALVE                TRICUSPID VALVE MV Area (PHT): 6.83 cm     TR Peak grad:   35.0 mmHg MV Decel Time: 111 msec     TR Vmax:        296.00 cm/s MV E velocity: 160.00 cm/s                             SHUNTS                             Systemic Diam: 1.70 cm Isaias Cowman MD Electronically signed by Isaias Cowman MD Signature Date/Time: 07/09/2021/1:24:30 PM    Final         Scheduled Meds:  atorvastatin  40 mg Oral Daily   docusate sodium  100 mg Oral Daily   enoxaparin (LOVENOX) injection  40 mg Subcutaneous G99M   folic acid  1 mg Oral Daily   furosemide  20 mg Oral Daily   metoprolol tartrate  50 mg Oral BID   multivitamin with minerals  1 tablet Oral Daily   predniSONE  40 mg Oral Q breakfast   thiamine  100 mg Oral Daily   Or   thiamine  100 mg Intravenous Daily   Continuous Infusions:   cefTRIAXone (ROCEPHIN)  IV Stopped (07/10/21 2228)     LOS: 2 days    Time spent: 35 min    Desma Maxim, MD [facility]   If 7PM-7AM, please contact night-coverage [email or web address] Password [facility] 07/11/2021, 10:27 AM

## 2021-07-11 NOTE — TOC Initial Note (Signed)
Transition of Care [facility]) - Initial/Assessment Note    Patient Details  Name: [place] Sellers MRN: [id number] Date of Birth: 03-26-1941  [facility]) CM/SW Contact:    Kerin Salen, RN Phone Number: 07/11/2021, 4:11 PM  Clinical Narrative:   Spoke with patient this morning who was alert and oriented x4, however hard of hearing and says vision is blurred due to not having her glasses. She liver with her nephew , who provides her care to include cooking, shopping and transportation. Attending entered the room did not complete assessment.  Attending ask to start the SNF process.  Rehab. Discussed with Terrilee Files who says patient is able to make her own decisions. When this was discussed before she declined because she does not want to give up Alcohol and cigarettes. Discussed with patient and she declines at this time..                    Patient Goals and CMS Choice        Expected Discharge Plan and Services                                                Prior Living Arrangements/Services                       Activities of Daily Living Home Assistive Devices/Equipment: Gilford Rile (specify type) ADL Screening (condition at time of admission) Patient's cognitive ability adequate to safely complete daily activities?: Yes Is the patient deaf or have difficulty hearing?: Yes Does the patient have difficulty seeing, even when wearing glasses/contacts?: No Does the patient have difficulty concentrating, remembering, or making decisions?: No Patient able to express need for assistance with ADLs?: Yes Does the patient have difficulty dressing or bathing?: Yes Independently performs ADLs?: Yes (appropriate for developmental age) Does the patient have difficulty walking or climbing stairs?: Yes Weakness of Legs: Both Weakness of Arms/Hands: None  Permission Sought/Granted                  Emotional Assessment              Admission  diagnosis:  CAP (community acquired pneumonia) [J18.9] Obstructive pneumonia [J18.9] Sepsis with acute hypoxic respiratory failure without septic shock, due to unspecified organism ([place]) [A41.9, R65.20, J96.01] Patient Active Problem List   Diagnosis Date Noted   Acute respiratory failure with hypoxia ([employer]) 07/09/2021   CAP (community acquired pneumonia) 07/09/2021   Obstructive pneumonia 07/09/2021   Coronary artery disease involving native coronary artery of native heart without angina pectoris 07/19/2020   COPD, moderate ([place]) 07/19/2020   Personal history of lung cancer 07/19/2020   Community acquired pneumonia 06/27/2020   Acute CHF (congestive heart failure) ([place]) 06/26/2020   Leukocytosis 06/26/2020   Hypoxia 06/26/2020   Thoracic aortic aneurysm without rupture ([place]) 08/16/2018   Kyphosis 08/07/2018   Hypertension 08/07/2018   Tobacco use 08/07/2018   Age-related osteoporosis without current pathological fracture 12/21/2016   Chronic hyponatremia 11/04/2015   Chronic low back pain 06/18/2015   Cardiomyopathy ([place]) 08/13/2014   Left bundle branch block (LBBB) 07/23/2014   Pedal edema 07/23/2014   SOB (shortness of breath) on exertion 07/23/2014   PCP:  [facility]:   [place] [place] [address] AT [facility]  [address] [place] [facility] [place] [address] Phone: [phone] Fax: [phone]     Social Determinants of Health (SDOH) Interventions    Readmission Risk Interventions No flowsheet data found.

## 2021-07-11 NOTE — Progress Notes (Signed)
Pulmonary Medicine          Date: 07/11/2021,   MRN# [id number] Felicia Sellers July 31, 1941      HISTORY OF PRESENT ILLNESS     As per admission h/p Felicia Sellers is a 80 y.o. female with medical history significant for Right lung cancer status post lobectomy, HTN, COPD, CHF, CAD, chronic hyponatremia who presents to the ED with shortness of breath, cough, muscle aches and generalized weakness.  She denied fever or chills.  She was brought in by EMS who recorded O2 sat of 87 on room air and she was placed on O2 via nasal cannula and treated with DuoNeb in route.  History taken from ER provider as patient was lethargic by the time of my assessment and unable to contribute to history On arrival, afebrile, BP 114/58 with pulse of 72, respirations 22 with O2 sat 95% on O2 at 2 L Blood work with WBC 16,000, lactic acid 1.4.  Troponin 14/8, BNP 288.  Sodium 129.  Labs otherwise unremarkable.  COVID and flu negative Imaging: Chest x-ray with complete opacification of right hemithorax CT chest without contrast collapse and consolidation of the right lung with suspicion of obstructing lesion involving the right bronchus intermedius.  This is likely to represent primary lung cancer.  Mediastinal lymphadenopathy likely metastatic, underlying emphysematous and chronic bronchitic changes  07/11/21- reviewed CT chest with patient , will likely need outpatient workup due to severe emphysema with overlying consolidated infiltrate of right lung and likely need 3 months for recovery prior to evaluation residual.  In the intermim will obtain sputum cytology and hypertonic saline to expectorate endoluminal debris.     PAST MEDICAL HISTORY   Past Medical History:  Diagnosis Date   Cancer ([place])    right lung removed - lung cancer 1/3 lung removed    CHF (congestive heart failure) ([facility])    COPD (chronic obstructive pulmonary disease) ([facility])    Coronary artery disease    History of stomach ulcers     Hypertension    Pneumonia      SURGICAL HISTORY   Past Surgical History:  Procedure Laterality Date   LUNG REMOVAL, PARTIAL Left      FAMILY HISTORY   Family History  Problem Relation Age of Onset   Breast cancer Neg Hx      SOCIAL HISTORY   Social History   Tobacco Use   Smoking status: Every Day    Packs/day: 1.00    Years: 66.00    Pack years: 66.00    Types: Cigarettes    Last attempt to quit: 06/26/2020    Years since quitting: 1.0   Smokeless tobacco: Never  Vaping Use   Vaping Use: Never used  Substance Use Topics   Alcohol use: Not Currently    Comment: use to drink alot ( vodka) 6 months ago   Drug use: Never     MEDICATIONS    Home Medication:    Current Medication:  Current Facility-Administered Medications:    acetaminophen (TYLENOL) tablet 650 mg, 650 mg, Oral, Q6H PRN **OR** acetaminophen (TYLENOL) suppository 650 mg, 650 mg, Rectal, Q6H PRN, Athena Masse, MD   atorvastatin (LIPITOR) tablet 40 mg, 40 mg, Oral, Daily, Wouk, Ailene Rud, MD, 40 mg at 07/11/21 0934   docusate sodium (COLACE) capsule 100 mg, 100 mg, Oral, Daily, Wouk, Ailene Rud, MD, 100 mg at 07/11/21 0934   enoxaparin (LOVENOX) injection 40 mg, 40 mg, Subcutaneous, Q24H, Athena Masse, MD,  40 mg at 20/25/42 7062   folic acid (FOLVITE) tablet 1 mg, 1 mg, Oral, Daily, Wouk, Ailene Rud, MD, 1 mg at 07/11/21 0935   furosemide (LASIX) tablet 20 mg, 20 mg, Oral, Daily, Wouk, Ailene Rud, MD, 20 mg at 07/11/21 0934   influenza vaccine adjuvanted (FLUAD) injection 0.5 mL, 0.5 mL, Intramuscular, Prior to discharge, Wouk, Ailene Rud, MD   ipratropium-albuterol (DUONEB) 0.5-2.5 (3) MG/3ML nebulizer solution 3 mL, 3 mL, Nebulization, Q4H PRN, Athena Masse, MD   LORazepam (ATIVAN) tablet 1-4 mg, 1-4 mg, Oral, Q1H PRN **OR** LORazepam (ATIVAN) injection 1-4 mg, 1-4 mg, Intravenous, Q1H PRN, Wouk, Ailene Rud, MD   metoprolol tartrate (LOPRESSOR) tablet 50 mg, 50 mg, Oral, BID,  Wouk, Ailene Rud, MD, 50 mg at 07/11/21 3762   multivitamin with minerals tablet 1 tablet, 1 tablet, Oral, Daily, Wouk, Ailene Rud, MD, 1 tablet at 07/11/21 0934   ondansetron (ZOFRAN) tablet 4 mg, 4 mg, Oral, Q6H PRN **OR** ondansetron (ZOFRAN) injection 4 mg, 4 mg, Intravenous, Q6H PRN, Athena Masse, MD   predniSONE (DELTASONE) tablet 40 mg, 40 mg, Oral, Q breakfast, Wouk, Ailene Rud, MD, 40 mg at 07/11/21 8315   thiamine tablet 100 mg, 100 mg, Oral, Daily, 100 mg at 07/11/21 0934 **OR** thiamine (B-1) injection 100 mg, 100 mg, Intravenous, Daily, Wouk, Ailene Rud, MD, 100 mg at 07/10/21 1205    ALLERGIES   Aspirin     REVIEW OF SYSTEMS    Review of Systems:  Gen:  Denies  fever, sweats, chills weigh loss  HEENT: Denies blurred vision, double vision, ear pain, eye pain, hearing loss, nose bleeds, sore throat Cardiac:  No dizziness, chest pain or heaviness, chest tightness,edema Resp:   + cough or sputum porduction, shortness of breath,wheezing, hemoptysis,  Gi: Denies swallowing difficulty, stomach pain, nausea or vomiting, diarrhea, constipation, bowel incontinence Gu:  Denies bladder incontinence, burning urine Ext:   Denies Joint pain, stiffness or swelling Skin: Denies  skin rash, easy bruising or bleeding or hives Endoc:  Denies polyuria, polydipsia , polyphagia or weight change Psych:   Denies depression, insomnia or hallucinations   Other:  All other systems negative   VS: BP 115/77 (BP Location: Right Arm)   Pulse (!) 105   Temp 98.1 F (36.7 C)   Resp 18   Ht 5' 1"  (1.549 m)   Wt 45.1 kg   SpO2 100%   BMI 18.79 kg/m      PHYSICAL EXAM    GENERAL:NAD, no fevers, chills, no weakness no fatigue HEAD: Normocephalic, atraumatic.  EYES: Pupils equal, round, reactive to light. Extraocular muscles intact. No scleral icterus.  MOUTH: Moist mucosal membrane. Dentition intact. No abscess noted.  EAR, NOSE, THROAT: Clear without exudates. No external  lesions.  NECK: Supple. No thyromegaly. No nodules. No JVD.  PULMONARY: Diffuse coarse rhonchi right sided +wheezes CARDIOVASCULAR: S1 and S2. Regular rate and rhythm. No murmurs, rubs, or gallops. No edema. Pedal pulses 2+ bilaterally.  GASTROINTESTINAL: Soft, nontender, nondistended. No masses. Positive bowel sounds. No hepatosplenomegaly.  MUSCULOSKELETAL: No swelling, clubbing, or edema. Range of motion full in all extremities.  NEUROLOGIC: Cranial nerves II through XII except hard of hearing. No gross focal neurological deficits. Sensation intact. Reflexes intact.  SKIN: No ulceration, lesions, rashes, or cyanosis. Skin warm and dry. Turgor intact.  PSYCHIATRIC: Mood, affect within normal limits. The patient is awake, alert and oriented x 3. Insight, judgment intact.       IMAGING  CT CHEST WO CONTRAST  Result Date: 07/09/2021 CLINICAL DATA:  Abnormal x-ray with pleural effusion. Shortness of breath today. EXAM: CT CHEST WITHOUT CONTRAST TECHNIQUE: Multidetector CT imaging of the chest was performed following the standard protocol without IV contrast. COMPARISON:  06/26/2020.  Chest radiograph 07/08/2021 FINDINGS: Cardiovascular: Mild cardiac enlargement. No pericardial effusion. Calcification in the mitral valve annulus, coronary arteries, and aorta. 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 Aneurysm of the lower descending thoracic aorta with maximal AP diameter of about 5.5 cm. Appearance is similar to previous study. Mediastinum/Nodes: Thyroid gland is unremarkable. Moderately prominent mediastinal lymph nodes with pretracheal nodes measuring up to about 1.8 cm short axis dimension. Esophagus is decompressed. Lungs/Pleura: Small right pleural effusion. Significant volume loss in the right lung with heterogeneous consolidation and air bronchograms throughout the right lung but most prominent in the right middle and right lower lungs.  Minimal residual aeration in the right upper lung. There is a cut off sign to the right bronchus intermedius which may indicate obstructing mass or endobronchial process. The left lung demonstrates diffuse emphysematous changes with peripheral fibrosis and multiple pulmonary nodules which have developed since the prior study. Overall, changes are likely to represent a right hilar or endobronchial mass causing postobstructive change on the right and metastatic disease throughout both lungs. Upper Abdomen: Prominent vascular calcifications. No acute process identified in the visualized upper abdomen. Musculoskeletal: Degenerative changes in the spine. No destructive bone lesions. IMPRESSION: 1. Collapse and consolidation of the right lung with suspicion of obstructing lesion involving the right bronchus intermedius. This is likely to represent primary lung cancer. 2. Diffuse pulmonary nodule seen best in the aerated left lung, likely metastatic disease. 3. Mediastinal lymphadenopathy is likely metastatic. 4. Underlying emphysematous and chronic bronchitic changes in the lungs with slight peripheral fibrosis. 5. 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 6. Diffuse aortic atherosclerosis. Electronically Signed   By: Lucienne Capers M.D.   On: 07/09/2021 01:23   MR BRAIN W WO CONTRAST  Result Date: 07/10/2021 CLINICAL DATA:  Hematologic malignancy, staging. EXAM: MRI HEAD WITHOUT AND WITH CONTRAST TECHNIQUE: Multiplanar, multiecho pulse sequences of the brain and surrounding structures were obtained without and with intravenous contrast. CONTRAST:  78m GADAVIST GADOBUTROL 1 MMOL/ML IV SOLN COMPARISON:  Head CT 05/28/2014 FINDINGS: Brain: No enhancement or swelling to suggest metastatic disease. No acute infarction, hemorrhage, hydrocephalus, extra-axial collection or mass lesion. Chronic small vessel ischemia that is extensive in the hemispheric white matter and mild in the pons. Small remote  bilateral cerebellar infarcts. Vascular: Normal flow voids. Skull and upper cervical spine: Normal marrow signal. Sinuses/Orbits: Negative. IMPRESSION: 1. No acute finding or evidence of metastatic disease. 2. Extensive chronic small vessel ischemia. Electronically Signed   By: JJorje GuildM.D.   On: 07/10/2021 11:39   DG Chest Portable 1 View  Result Date: 07/08/2021 CLINICAL DATA:  feeling short of breath today Best films possible due to pt condition. CHf/COPd exacerbation.e val pulm congestion/infiltrate EXAM: PORTABLE CHEST 1 VIEW COMPARISON:  Chest x-ray 06/26/2020 FINDINGS: The heart and mediastinal contours are grossly unchanged. Aortic calcification. Right lung pulmonary staples noted. Almost complete opacification of the right hemithorax. Increased interstitial markings bilaterally. Likely some component of right pleural effusion. No left pleural effusion. No pneumothorax. No acute osseous abnormality. IMPRESSION: Almost complete opacification of the right hemithorax as well as increased interstitial markings bilaterally. Likely some component of right pleural effusion. Findings suggestive of infection/inflammation. Underlying malignancy  not excluded. Recommend CT chest with intravenous contrast for further evaluation. Electronically Signed   By: Iven Finn M.D.   On: 07/08/2021 22:48   ECHOCARDIOGRAM COMPLETE  Result Date: 07/09/2021    ECHOCARDIOGRAM REPORT   Patient Name:   Felicia Sellers Date of Exam: 07/09/2021 Medical Rec #:  [id number]    Height:       61.0 in Accession #:    [id number]   Weight:       116.0 lb Date of Birth:  Mar 10, 1941    BSA:          1.498 m Patient Age:    58 years     BP:           114/58 mmHg Patient Gender: F            HR:           116 bpm. Exam Location:  [facility] Procedure: 2D Echo, Cardiac Doppler and Color Doppler Indications:     CHF-Acute Diastolic M54.65  History:         Patient has prior history of Echocardiogram examinations. CHF,                  CAD;  Risk Factors:Hypertension.  Sonographer:     Alyse Low Roar Referring Phys:  [id number] Athena Masse Diagnosing Phys: Isaias Cowman MD IMPRESSIONS  1. Left ventricular ejection fraction, by estimation, is 55 to 60%. The left ventricle has normal function. The left ventricle has no regional wall motion abnormalities. Left ventricular diastolic parameters are indeterminate.  2. Right ventricular systolic function is normal. The right ventricular size is normal.  3. The mitral valve is normal in structure. Mild mitral valve regurgitation. No evidence of mitral stenosis.  4. Tricuspid valve regurgitation is moderate.  5. The aortic valve is normal in structure. Aortic valve regurgitation is not visualized. No aortic stenosis is present.  6. The inferior vena cava is normal in size with greater than 50% respiratory variability, suggesting right atrial pressure of 3 mmHg. FINDINGS  Left Ventricle: Left ventricular ejection fraction, by estimation, is 55 to 60%. The left ventricle has normal function. The left ventricle has no regional wall motion abnormalities. The left ventricular internal cavity size was normal in size. There is  no left ventricular hypertrophy. Left ventricular diastolic parameters are indeterminate. Right Ventricle: The right ventricular size is normal. No increase in right ventricular wall thickness. Right ventricular systolic function is normal. Left Atrium: Left atrial size was normal in size. Right Atrium: Right atrial size was normal in size. Pericardium: There is no evidence of pericardial effusion. Mitral Valve: The mitral valve is normal in structure. Mild mitral valve regurgitation. No evidence of mitral valve stenosis. Tricuspid Valve: The tricuspid valve is normal in structure. Tricuspid valve regurgitation is moderate . No evidence of tricuspid stenosis. Aortic Valve: The aortic valve is normal in structure. Aortic valve regurgitation is not visualized. No aortic stenosis is present.  Aortic valve peak gradient measures 7.1 mmHg. Pulmonic Valve: The pulmonic valve was normal in structure. Pulmonic valve regurgitation is not visualized. No evidence of pulmonic stenosis. Aorta: The aortic root is normal in size and structure. Venous: The inferior vena cava is normal in size with greater than 50% respiratory variability, suggesting right atrial pressure of 3 mmHg. IAS/Shunts: No atrial level shunt detected by color flow Doppler.  LEFT VENTRICLE PLAX 2D LVIDd:         4.10 cm  Diastology LVIDs:  2.90 cm  LV e' medial:    9.03 cm/s LV PW:         1.10 cm  LV E/e' medial:  17.7 LV IVS:        1.20 cm  LV e' lateral:   6.31 cm/s LVOT diam:     1.70 cm  LV E/e' lateral: 25.4 LVOT Area:     2.27 cm  RIGHT VENTRICLE RV Basal diam:  2.40 cm RV Mid diam:    2.10 cm RV S prime:     12.20 cm/s TAPSE (M-mode): 1.5 cm LEFT ATRIUM             Index       RIGHT ATRIUM           Index LA diam:        3.70 cm 2.47 cm/m  RA Area:     14.30 cm LA Vol (A2C):   41.2 ml 27.50 ml/m RA Volume:   32.30 ml  21.56 ml/m LA Vol (A4C):   31.9 ml 21.29 ml/m LA Biplane Vol: 36.7 ml 24.49 ml/m  AORTIC VALVE                PULMONIC VALVE AV Area (Vmax): 1.93 cm    PV Vmax:        1.22 m/s AV Vmax:        133.00 cm/s PV Peak grad:   6.0 mmHg AV Peak Grad:   7.1 mmHg    RVOT Peak grad: 2 mmHg LVOT Vmax:      113.00 cm/s  AORTA Ao Root diam: 2.70 cm MITRAL VALVE                TRICUSPID VALVE MV Area (PHT): 6.83 cm     TR Peak grad:   35.0 mmHg MV Decel Time: 111 msec     TR Vmax:        296.00 cm/s MV E velocity: 160.00 cm/s                             SHUNTS                             Systemic Diam: 1.70 cm Isaias Cowman MD Electronically signed by Isaias Cowman MD Signature Date/Time: 07/09/2021/1:24:30 PM    Final       ASSESSMENT/PLAN   This is a pleasant 80 yr old lady here in respiratory failure, known copd, acute on chronic diastolic heart failure, echo suggestive of secondary pulmonary  hypertension and afib. Cardiology has seen, recommendations noted. More alert today   Acute hypoxemic respiratory failure - present on admission  - COVID19 negative  - supplemental O2 during my evaluation 6L/min - will perform infectious workup for pneumonia -serum fungitell -legionella ab -strep pneumoniae ur AG -Histoplasma Ur Ag -sputum resp cultures -AFB sputum expectorated specimen -sputum cytology  -reviewed pertinent imaging with patient today - ESR -PT/OT for d/c planning  -please encourage patient to use incentive spirometer few times each hour while hospitalized.     Right endobronchial mass of bronchus intermedius - patient higher risk at this time would favor completion of full scope of therapy with CAP regimen then can set up bronchosocopy   Thank you for allowing me to participate in the care of this patient.   Patient/Family are satisfied with care plan and all questions have been answered.  This document was prepared using Dragon voice recognition software and may include unintentional dictation errors.    Ottie Glazier, M.D.  Pulmonary [facility] Meta

## 2021-07-11 NOTE — Consult Note (Signed)
   Heart Failure Nurse Navigator Note  HFpEF 55-60%  She presented to the ED by was of EMS due to SOB, muscle aches and generalized weakness.  She presented from home.    Comorbidities:  Lung cancer Hypertension COPD Coronary artery disease Chronic hyponatremia  Medications:  Atorvastatin 40 mg daily Folic acid 1 mg daily Furosemide 20 mg daily Metoprolol tartrate 50 mg 2 times a day.  Labs:  Sodium 130, potassium 3.7, chloride 95, CO2 28, BUN 18, creatinine 0.64, magnesium is 2.  Initial meeting with patient.  Was difficult because she did not have her glasses and she is very hard of hearing without her hearing aid.  She had asked her nephew to bring those in but as of yet he had not.  Discussed her diet as nephew is the one that is in charge of cooking.  Also discussed fluid restriction.  Discussed the importance of weighing daily and recording and what to report to physician.  Also discussed follow-up in the outpatient heart failure clinic.  She was given the living with heart failure  teaching booklet along with heart failure packet appointment to follow-up with Darylene Price in the outpatient heart failure clinic appointment on September 26 at 3 PM.  She has a 0% of no-shows.   Pricilla Riffle RN [facility]

## 2021-07-11 NOTE — Consult Note (Signed)
Pharmacy Antibiotic Note  Felicia Sellers is a 80 y.o. female admitted on 07/08/2021 with pneumonia.  Pharmacy has been consulted for Zosyn dosing.  Plan: Will dose Zosyn @3 .375gms q8hrs per extended interval infusion.  Height: 5\' 1"  (154.9 cm) Weight: 45.1 kg (99 lb 6.8 oz) IBW/kg (Calculated) : 47.8  Temp (24hrs), Avg:97.9 F (36.6 C), Min:97.3 F (36.3 C), Max:98.4 F (36.9 C)  Recent Labs  Lab 07/08/21 2206 07/08/21 2302 07/09/21 0017 07/09/21 0027 07/09/21 0725 07/10/21 0729 07/11/21 0444  WBC 15.9*  --   --   --  13.5* 18.5* 14.4*  CREATININE  --   --  0.75  --   --  0.62 0.64  LATICACIDVEN  --  1.4  --  1.3  --   --   --     Estimated Creatinine Clearance: 38.6 mL/min (by C-G formula based on SCr of 0.64 mg/dL).    Allergies  Allergen Reactions   Aspirin Other (See Comments)    High risk of bleeding    Antimicrobials this admission: Rocephin 2gms IV X 4 doses Azithromycin 500mg  IV X 3 doses  Microbiology results: 9/16 BCx: NG@3  days 9/16 UCx: <10,000 CC  9/16 SARS virus: negative  Thank you for allowing pharmacy to be a part of this patient's care.  Berta Minor 07/11/2021 6:16 PM

## 2021-07-12 ENCOUNTER — Encounter: Payer: Self-pay | Admitting: *Deleted

## 2021-07-12 DIAGNOSIS — R918 Other nonspecific abnormal finding of lung field: Secondary | ICD-10-CM

## 2021-07-12 LAB — STREP PNEUMONIAE URINARY ANTIGEN: Strep Pneumo Urinary Antigen: NEGATIVE

## 2021-07-12 LAB — BASIC METABOLIC PANEL
Anion gap: 6 (ref 5–15)
BUN: 13 mg/dL (ref 8–23)
CO2: 29 mmol/L (ref 22–32)
Calcium: 8.6 mg/dL — ABNORMAL LOW (ref 8.9–10.3)
Chloride: 95 mmol/L — ABNORMAL LOW (ref 98–111)
Creatinine, Ser: 0.55 mg/dL (ref 0.44–1.00)
GFR, Estimated: >60 mL/min (ref >60)
Glucose, Bld: 87 mg/dL (ref 70–99)
Potassium: 3.7 mmol/L (ref 3.5–5.1)
Sodium: 130 mmol/L — ABNORMAL LOW (ref 135–145)

## 2021-07-12 LAB — MAGNESIUM: Magnesium: 2.1 mg/dL (ref 1.7–2.4)

## 2021-07-12 NOTE — Care Management Important Message (Signed)
Important Message  Patient Details  Name: Felicia Sellers MRN: [id number] Date of Birth: 21-Jan-1941   Medicare Important Message Given:  Yes     Dannette Barbara 07/12/2021, 11:14 AM

## 2021-07-12 NOTE — Progress Notes (Signed)
Pulmonary Medicine          Date: 07/12/2021,   MRN# [id number] Felicia Sellers 1941-06-25      HISTORY OF PRESENT ILLNESS     As per admission h/p Christel Bai is a 80 y.o. female with medical history significant for Right lung cancer status post lobectomy, HTN, COPD, CHF, CAD, chronic hyponatremia who presents to the ED with shortness of breath, cough, muscle aches and generalized weakness.  She denied fever or chills.  She was brought in by EMS who recorded O2 sat of 87 on room air and she was placed on O2 via nasal cannula and treated with DuoNeb in route.  History taken from ER provider as patient was lethargic by the time of my assessment and unable to contribute to history On arrival, afebrile, BP 114/58 with pulse of 72, respirations 22 with O2 sat 95% on O2 at 2 L Blood work with WBC 16,000, lactic acid 1.4.  Troponin 14/8, BNP 288.  Sodium 129.  Labs otherwise unremarkable.  COVID and flu negative Imaging: Chest x-ray with complete opacification of right hemithorax CT chest without contrast collapse and consolidation of the right lung with suspicion of obstructing lesion involving the right bronchus intermedius.  This is likely to represent primary lung cancer.  Mediastinal lymphadenopathy likely metastatic, underlying emphysematous and chronic bronchitic changes  07/11/21- reviewed CT chest with patient , will likely need outpatient workup due to severe emphysema with overlying consolidated infiltrate of right lung and likely need 3 months for recovery prior to evaluation residual.  In the intermim will obtain sputum cytology and hypertonic saline to expectorate endoluminal debris.   07/12/21- patient is improved shes weaning on O2 and expectorating phlegm better reports breathing is stronger.  Reviewed plan with Dr Si Raider and we will try to dc home then plan outpatient malignancy workup.  Patient is thankful and agreeable.  She reports transportaion issues, ill place TOC consult  to help her.   PAST MEDICAL HISTORY   Past Medical History:  Diagnosis Date   Cancer ([place])    right lung removed - lung cancer 1/3 lung removed    CHF (congestive heart failure) (H[facility])    COPD (chronic obstructive pulmonary disease) ([place])    Coronary artery disease    History of stomach ulcers    Hypertension    Pneumonia      SURGICAL HISTORY   Past Surgical History:  Procedure Laterality Date   LUNG REMOVAL, PARTIAL Left      FAMILY HISTORY   Family History  Problem Relation Age of Onset   Breast cancer Neg Hx      SOCIAL HISTORY   Social History   Tobacco Use   Smoking status: Every Day    Packs/day: 1.00    Years: 66.00    Pack years: 66.00    Types: Cigarettes    Last attempt to quit: 06/26/2020    Years since quitting: 1.0   Smokeless tobacco: Never  Vaping Use   Vaping Use: Never used  Substance Use Topics   Alcohol use: Not Currently    Comment: use to drink alot ( vodka) 6 months ago   Drug use: Never     MEDICATIONS    Home Medication:    Current Medication:  Current Facility-Administered Medications:    acetaminophen (TYLENOL) tablet 650 mg, 650 mg, Oral, Q6H PRN **OR** acetaminophen (TYLENOL) suppository 650 mg, 650 mg, Rectal, Q6H PRN, Athena Masse, MD   atorvastatin (LIPITOR)  tablet 40 mg, 40 mg, Oral, Daily, Wouk, Ailene Rud, MD, 40 mg at 07/12/21 0827   docusate sodium (COLACE) capsule 100 mg, 100 mg, Oral, Daily, Wouk, Ailene Rud, MD, 100 mg at 07/11/21 0934   enoxaparin (LOVENOX) injection 40 mg, 40 mg, Subcutaneous, Q24H, Athena Masse, MD, 40 mg at 26/94/85 4627   folic acid (FOLVITE) tablet 1 mg, 1 mg, Oral, Daily, Wouk, Ailene Rud, MD, 1 mg at 07/12/21 0350   influenza vaccine adjuvanted (FLUAD) injection 0.5 mL, 0.5 mL, Intramuscular, Prior to discharge, Wouk, Ailene Rud, MD   ipratropium-albuterol (DUONEB) 0.5-2.5 (3) MG/3ML nebulizer solution 3 mL, 3 mL, Nebulization, Q4H PRN, Athena Masse, MD   LORazepam  (ATIVAN) tablet 1-4 mg, 1-4 mg, Oral, Q1H PRN **OR** LORazepam (ATIVAN) injection 1-4 mg, 1-4 mg, Intravenous, Q1H PRN, Wouk, Ailene Rud, MD   metoprolol tartrate (LOPRESSOR) tablet 12.5 mg, 12.5 mg, Oral, BID, Ottie Glazier, MD, 12.5 mg at 07/12/21 0938   multivitamin with minerals tablet 1 tablet, 1 tablet, Oral, Daily, Wouk, Ailene Rud, MD, 1 tablet at 07/12/21 0828   ondansetron (ZOFRAN) tablet 4 mg, 4 mg, Oral, Q6H PRN **OR** ondansetron (ZOFRAN) injection 4 mg, 4 mg, Intravenous, Q6H PRN, Athena Masse, MD   piperacillin-tazobactam (ZOSYN) IVPB 3.375 g, 3.375 g, Intravenous, Q8H, Wouk, Ailene Rud, MD, Last Rate: 12.5 mL/hr at 07/12/21 1501, 3.375 g at 07/12/21 1501   predniSONE (DELTASONE) tablet 20 mg, 20 mg, Oral, Q breakfast, Ottie Glazier, MD, 20 mg at 07/12/21 1829   sodium chloride tablet 2 g, 2 g, Oral, TID WC, Ottie Glazier, MD, 2 g at 07/12/21 1713   thiamine tablet 100 mg, 100 mg, Oral, Daily, 100 mg at 07/12/21 9371 **OR** thiamine (B-1) injection 100 mg, 100 mg, Intravenous, Daily, Wouk, Ailene Rud, MD, 100 mg at 07/10/21 1205    ALLERGIES   Aspirin     REVIEW OF SYSTEMS    Review of Systems:  Gen:  Denies  fever, sweats, chills weigh loss  HEENT: Denies blurred vision, double vision, ear pain, eye pain, hearing loss, nose bleeds, sore throat Cardiac:  No dizziness, chest pain or heaviness, chest tightness,edema Resp:   + cough or sputum porduction, shortness of breath,wheezing, hemoptysis,  Gi: Denies swallowing difficulty, stomach pain, nausea or vomiting, diarrhea, constipation, bowel incontinence Gu:  Denies bladder incontinence, burning urine Ext:   Denies Joint pain, stiffness or swelling Skin: Denies  skin rash, easy bruising or bleeding or hives Endoc:  Denies polyuria, polydipsia , polyphagia or weight change Psych:   Denies depression, insomnia or hallucinations   Other:  All other systems negative   VS: BP (!) 120/91 (BP Location:  Right Arm)   Pulse (!) 103   Temp 98 F (36.7 C) (Oral)   Resp 18   Ht 5' 1"  (1.549 m)   Wt 45.1 kg   SpO2 99%   BMI 18.79 kg/m      PHYSICAL EXAM    GENERAL:NAD, no fevers, chills, no weakness no fatigue HEAD: Normocephalic, atraumatic.  EYES: Pupils equal, round, reactive to light. Extraocular muscles intact. No scleral icterus.  MOUTH: Moist mucosal membrane. Dentition intact. No abscess noted.  EAR, NOSE, THROAT: Clear without exudates. No external lesions.  NECK: Supple. No thyromegaly. No nodules. No JVD.  PULMONARY: Diffuse coarse rhonchi right sided +wheezes CARDIOVASCULAR: S1 and S2. Regular rate and rhythm. No murmurs, rubs, or gallops. No edema. Pedal pulses 2+ bilaterally.  GASTROINTESTINAL: Soft, nontender, nondistended. No masses. Positive bowel sounds.  No hepatosplenomegaly.  MUSCULOSKELETAL: No swelling, clubbing, or edema. Range of motion full in all extremities.  NEUROLOGIC: Cranial nerves II through XII except hard of hearing. No gross focal neurological deficits. Sensation intact. Reflexes intact.  SKIN: No ulceration, lesions, rashes, or cyanosis. Skin warm and dry. Turgor intact.  PSYCHIATRIC: Mood, affect within normal limits. The patient is awake, alert and oriented x 3. Insight, judgment intact.       IMAGING    CT CHEST WO CONTRAST  Result Date: 07/09/2021 CLINICAL DATA:  Abnormal x-ray with pleural effusion. Shortness of breath today. EXAM: CT CHEST WITHOUT CONTRAST TECHNIQUE: Multidetector CT imaging of the chest was performed following the standard protocol without IV contrast. COMPARISON:  06/26/2020.  Chest radiograph 07/08/2021 FINDINGS: Cardiovascular: Mild cardiac enlargement. No pericardial effusion. Calcification in the mitral valve annulus, coronary arteries, and aorta. 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 Aneurysm of the lower descending thoracic aorta with maximal AP diameter of  about 5.5 cm. Appearance is similar to previous study. Mediastinum/Nodes: Thyroid gland is unremarkable. Moderately prominent mediastinal lymph nodes with pretracheal nodes measuring up to about 1.8 cm short axis dimension. Esophagus is decompressed. Lungs/Pleura: Small right pleural effusion. Significant volume loss in the right lung with heterogeneous consolidation and air bronchograms throughout the right lung but most prominent in the right middle and right lower lungs. Minimal residual aeration in the right upper lung. There is a cut off sign to the right bronchus intermedius which may indicate obstructing mass or endobronchial process. The left lung demonstrates diffuse emphysematous changes with peripheral fibrosis and multiple pulmonary nodules which have developed since the prior study. Overall, changes are likely to represent a right hilar or endobronchial mass causing postobstructive change on the right and metastatic disease throughout both lungs. Upper Abdomen: Prominent vascular calcifications. No acute process identified in the visualized upper abdomen. Musculoskeletal: Degenerative changes in the spine. No destructive bone lesions. IMPRESSION: 1. Collapse and consolidation of the right lung with suspicion of obstructing lesion involving the right bronchus intermedius. This is likely to represent primary lung cancer. 2. Diffuse pulmonary nodule seen best in the aerated left lung, likely metastatic disease. 3. Mediastinal lymphadenopathy is likely metastatic. 4. Underlying emphysematous and chronic bronchitic changes in the lungs with slight peripheral fibrosis. 5. 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 6. Diffuse aortic atherosclerosis. Electronically Signed   By: Lucienne Capers M.D.   On: 07/09/2021 01:23   MR BRAIN W WO CONTRAST  Result Date: 07/10/2021 CLINICAL DATA:  Hematologic malignancy, staging. EXAM: MRI HEAD WITHOUT AND WITH CONTRAST TECHNIQUE: Multiplanar,  multiecho pulse sequences of the brain and surrounding structures were obtained without and with intravenous contrast. CONTRAST:  57m GADAVIST GADOBUTROL 1 MMOL/ML IV SOLN COMPARISON:  Head CT 05/28/2014 FINDINGS: Brain: No enhancement or swelling to suggest metastatic disease. No acute infarction, hemorrhage, hydrocephalus, extra-axial collection or mass lesion. Chronic small vessel ischemia that is extensive in the hemispheric white matter and mild in the pons. Small remote bilateral cerebellar infarcts. Vascular: Normal flow voids. Skull and upper cervical spine: Normal marrow signal. Sinuses/Orbits: Negative. IMPRESSION: 1. No acute finding or evidence of metastatic disease. 2. Extensive chronic small vessel ischemia. Electronically Signed   By: JJorje GuildM.D.   On: 07/10/2021 11:39   DG Chest Portable 1 View  Result Date: 07/08/2021 CLINICAL DATA:  feeling short of breath today Best films possible due to pt condition. CHf/COPd exacerbation.e val pulm congestion/infiltrate EXAM: PORTABLE CHEST  1 VIEW COMPARISON:  Chest x-ray 06/26/2020 FINDINGS: The heart and mediastinal contours are grossly unchanged. Aortic calcification. Right lung pulmonary staples noted. Almost complete opacification of the right hemithorax. Increased interstitial markings bilaterally. Likely some component of right pleural effusion. No left pleural effusion. No pneumothorax. No acute osseous abnormality. IMPRESSION: Almost complete opacification of the right hemithorax as well as increased interstitial markings bilaterally. Likely some component of right pleural effusion. Findings suggestive of infection/inflammation. Underlying malignancy not excluded. Recommend CT chest with intravenous contrast for further evaluation. Electronically Signed   By: Iven Finn M.D.   On: 07/08/2021 22:48   ECHOCARDIOGRAM COMPLETE  Result Date: 07/09/2021    ECHOCARDIOGRAM REPORT   Patient Name:   LORIANN BOSSERMAN Date of Exam: 07/09/2021  Medical Rec #:  [id number]    Height:       61.0 in Accession #:    [id number]   Weight:       116.0 lb Date of Birth:  [birth year]    BSA:          1.498 m Patient Age:    43 years     BP:           114/58 mmHg Patient Gender: F            HR:           116 bpm. Exam Location:  [facility] Procedure: 2D Echo, Cardiac Doppler and Color Doppler Indications:     CHF-Acute Diastolic T06.26  History:         Patient has prior history of Echocardiogram examinations. CHF,                  CAD; Risk Factors:Hypertension.  Sonographer:     Alyse Low Roar Referring Phys:  [id number] Athena Masse Diagnosing Phys: Isaias Cowman MD IMPRESSIONS  1. Left ventricular ejection fraction, by estimation, is 55 to 60%. The left ventricle has normal function. The left ventricle has no regional wall motion abnormalities. Left ventricular diastolic parameters are indeterminate.  2. Right ventricular systolic function is normal. The right ventricular size is normal.  3. The mitral valve is normal in structure. Mild mitral valve regurgitation. No evidence of mitral stenosis.  4. Tricuspid valve regurgitation is moderate.  5. The aortic valve is normal in structure. Aortic valve regurgitation is not visualized. No aortic stenosis is present.  6. The inferior vena cava is normal in size with greater than 50% respiratory variability, suggesting right atrial pressure of 3 mmHg. FINDINGS  Left Ventricle: Left ventricular ejection fraction, by estimation, is 55 to 60%. The left ventricle has normal function. The left ventricle has no regional wall motion abnormalities. The left ventricular internal cavity size was normal in size. There is  no left ventricular hypertrophy. Left ventricular diastolic parameters are indeterminate. Right Ventricle: The right ventricular size is normal. No increase in right ventricular wall thickness. Right ventricular systolic function is normal. Left Atrium: Left atrial size was normal in size. Right Atrium: Right atrial  size was normal in size. Pericardium: There is no evidence of pericardial effusion. Mitral Valve: The mitral valve is normal in structure. Mild mitral valve regurgitation. No evidence of mitral valve stenosis. Tricuspid Valve: The tricuspid valve is normal in structure. Tricuspid valve regurgitation is moderate . No evidence of tricuspid stenosis. Aortic Valve: The aortic valve is normal in structure. Aortic valve regurgitation is not visualized. No aortic stenosis is present. Aortic valve peak gradient measures 7.1 mmHg. Pulmonic Valve: The pulmonic valve  was normal in structure. Pulmonic valve regurgitation is not visualized. No evidence of pulmonic stenosis. Aorta: The aortic root is normal in size and structure. Venous: The inferior vena cava is normal in size with greater than 50% respiratory variability, suggesting right atrial pressure of 3 mmHg. IAS/Shunts: No atrial level shunt detected by color flow Doppler.  LEFT VENTRICLE PLAX 2D LVIDd:         4.10 cm  Diastology LVIDs:         2.90 cm  LV e' medial:    9.03 cm/s LV PW:         1.10 cm  LV E/e' medial:  17.7 LV IVS:        1.20 cm  LV e' lateral:   6.31 cm/s LVOT diam:     1.70 cm  LV E/e' lateral: 25.4 LVOT Area:     2.27 cm  RIGHT VENTRICLE RV Basal diam:  2.40 cm RV Mid diam:    2.10 cm RV S prime:     12.20 cm/s TAPSE (M-mode): 1.5 cm LEFT ATRIUM             Index       RIGHT ATRIUM           Index LA diam:        3.70 cm 2.47 cm/m  RA Area:     14.30 cm LA Vol (A2C):   41.2 ml 27.50 ml/m RA Volume:   32.30 ml  21.56 ml/m LA Vol (A4C):   31.9 ml 21.29 ml/m LA Biplane Vol: 36.7 ml 24.49 ml/m  AORTIC VALVE                PULMONIC VALVE AV Area (Vmax): 1.93 cm    PV Vmax:        1.22 m/s AV Vmax:        133.00 cm/s PV Peak grad:   6.0 mmHg AV Peak Grad:   7.1 mmHg    RVOT Peak grad: 2 mmHg LVOT Vmax:      113.00 cm/s  AORTA Ao Root diam: 2.70 cm MITRAL VALVE                TRICUSPID VALVE MV Area (PHT): 6.83 cm     TR Peak grad:   35.0 mmHg MV  Decel Time: 111 msec     TR Vmax:        296.00 cm/s MV E velocity: 160.00 cm/s                             SHUNTS                             Systemic Diam: 1.70 cm Isaias Cowman MD Electronically signed by Isaias Cowman MD Signature Date/Time: 07/09/2021/1:24:30 PM    Final       ASSESSMENT/PLAN   This is a pleasant 80 yr old lady here in respiratory failure, known copd, acute on chronic diastolic heart failure, echo suggestive of secondary pulmonary hypertension and afib. Cardiology has seen, recommendations noted. More alert today   Acute hypoxemic respiratory failure - present on admission  - COVID19 negative  - supplemental O2 during my evaluation 6L/min - will perform infectious workup for pneumonia -serum fungitell -legionella ab -strep pneumoniae ur AG -Histoplasma Ur Ag -sputum resp cultures -AFB sputum expectorated specimen -sputum cytology  -reviewed pertinent imaging with patient  today - ESR -PT/OT for d/c planning  -please encourage patient to use incentive spirometer few times each hour while hospitalized.     Right endobronchial mass of bronchus intermedius - patient higher risk at this time would favor completion of full scope of therapy with CAP regimen then can set up bronchosocopy   Thank you for allowing me to participate in the care of this patient.   Patient/Family are satisfied with care plan and all questions have been answered.  This document was prepared using Dragon voice recognition software and may include unintentional dictation errors.    Ottie Glazier, M.D.  Pulmonary & [place]

## 2021-07-12 NOTE — NC FL2 (Signed)
[place] LEVEL OF CARE SCREENING TOOL     IDENTIFICATION  Patient Name: Felicia Sellers Birthdate: 28-Dec-1940 Sex: female Admission Date (Current Location): 07/08/2021  [facility] and [place] Number:  Engineering geologist and Address:  [facility], [address] [place], [place] [address]      Provider Number: [id number]  Attending Physician Name and Address:  Gwynne Edinger, MD  Relative Name and Phone Number:  [name] (nephew) [phone]    Current Level of Care: Hospital Recommended Level of Care: [place] Prior Approval Number:    Date Approved/Denied:   PASRR Number: [id number]  Discharge Plan: SNF    Current Diagnoses: Patient Active Problem List   Diagnosis Date Noted   Acute respiratory failure with hypoxia ([place]) 07/09/2021   CAP (community acquired pneumonia) 07/09/2021   Obstructive pneumonia 07/09/2021   Coronary artery disease involving native coronary artery of native heart without angina pectoris 07/19/2020   COPD, moderate ([place]) 07/19/2020   Personal history of lung cancer 07/19/2020   Community acquired pneumonia 06/27/2020   Acute CHF (congestive heart failure) ([place]) 06/26/2020   Leukocytosis 06/26/2020   Hypoxia 06/26/2020   Thoracic aortic aneurysm without rupture ([place]) 08/16/2018   Kyphosis 08/07/2018   Hypertension 08/07/2018   Tobacco use 08/07/2018   Age-related osteoporosis without current pathological fracture 12/21/2016   Chronic hyponatremia 11/04/2015   Chronic low back pain 06/18/2015   Cardiomyopathy ([place]) 08/13/2014   Left bundle branch block (LBBB) 07/23/2014   Pedal edema 07/23/2014   SOB (shortness of breath) on exertion 07/23/2014    Orientation RESPIRATION BLADDER Height & Weight     Self, Time, Situation, Place  O2 (3L nasal cannula) Incontinent, External catheter Weight: 99 lb 6.8 oz (45.1 kg) Height:  5\' 1"  (154.9 cm)  BEHAVIORAL SYMPTOMS/MOOD NEUROLOGICAL  BOWEL NUTRITION STATUS      Continent Diet (see discharge summary)  AMBULATORY STATUS COMMUNICATION OF NEEDS Skin   Extensive Assist Verbally Normal                       Personal Care Assistance Level of Assistance  Feeding, Bathing, Dressing, Total care Bathing Assistance: Limited assistance Feeding assistance: Independent Dressing Assistance: Limited assistance Total Care Assistance: Maximum assistance   Functional Limitations Info  Sight, Hearing, Speech Sight Info: Adequate Hearing Info: Impaired Speech Info: Adequate    SPECIAL CARE FACTORS FREQUENCY  PT (By licensed PT), OT (By licensed OT)     PT Frequency: min 4x weekly OT Frequency: min 4x weekly            Contractures Contractures Info: Not present    Additional Factors Info  Code Status, Allergies Code Status Info: full Allergies Info: aspirin           Current Medications (07/12/2021):  This is the current hospital active medication list Current Facility-Administered Medications  Medication Dose Route Frequency Provider Last Rate Last Admin   acetaminophen (TYLENOL) tablet 650 mg  650 mg Oral Q6H PRN Athena Masse, MD       Or   acetaminophen (TYLENOL) suppository 650 mg  650 mg Rectal Q6H PRN Athena Masse, MD       atorvastatin (LIPITOR) tablet 40 mg  40 mg Oral Daily Gwynne Edinger, MD   40 mg at 07/12/21 0827   docusate sodium (COLACE) capsule 100 mg  100 mg Oral Daily Gwynne Edinger, MD   100 mg at 07/11/21 [phone]  enoxaparin (LOVENOX) injection 40 mg  40 mg Subcutaneous Q24H Judd Gaudier V, MD   40 mg at 83/29/19 1660   folic acid (FOLVITE) tablet 1 mg  1 mg Oral Daily Wouk, Ailene Rud, MD   1 mg at 07/12/21 6004   influenza vaccine adjuvanted (FLUAD) injection 0.5 mL  0.5 mL Intramuscular Prior to discharge Wouk, Ailene Rud, MD       ipratropium-albuterol (DUONEB) 0.5-2.5 (3) MG/3ML nebulizer solution 3 mL  3 mL Nebulization Q4H PRN Athena Masse, MD       LORazepam  (ATIVAN) tablet 1-4 mg  1-4 mg Oral Q1H PRN Wouk, Ailene Rud, MD       Or   LORazepam (ATIVAN) injection 1-4 mg  1-4 mg Intravenous Q1H PRN Wouk, Ailene Rud, MD       metoprolol tartrate (LOPRESSOR) tablet 12.5 mg  12.5 mg Oral BID Ottie Glazier, MD   12.5 mg at 07/12/21 5997   multivitamin with minerals tablet 1 tablet  1 tablet Oral Daily Gwynne Edinger, MD   1 tablet at 07/12/21 0828   ondansetron (ZOFRAN) tablet 4 mg  4 mg Oral Q6H PRN Athena Masse, MD       Or   ondansetron [facility] [facility]) injection 4 mg  4 mg Intravenous Q6H PRN Athena Masse, MD       piperacillin-tazobactam (ZOSYN) IVPB 3.375 g  3.375 g Intravenous Q8H Wouk, Ailene Rud, MD 12.5 mL/hr at 07/12/21 0514 3.375 g at 07/12/21 0514   predniSONE (DELTASONE) tablet 20 mg  20 mg Oral Q breakfast Ottie Glazier, MD   20 mg at 07/12/21 7414   sodium chloride tablet 2 g  2 g Oral TID WC Ottie Glazier, MD   2 g at 07/12/21 1248   thiamine tablet 100 mg  100 mg Oral Daily Gwynne Edinger, MD   100 mg at 07/12/21 2395   Or   thiamine (B-1) injection 100 mg  100 mg Intravenous Daily Gwynne Edinger, MD   100 mg at 07/10/21 1205     Discharge Medications: Please see discharge summary for a list of discharge medications.  Relevant Imaging Results:  Relevant Lab Results:   Additional Information SSN: [id number]  Alberteen Sam, LCSW

## 2021-07-12 NOTE — Progress Notes (Addendum)
PROGRESS NOTE    Felicia Sellers  KXF:[id number] DOB: [birth year] DOA: 07/08/2021 PCP: [facility]  Outpatient Specialists: oncology, cardiology    Brief Narrative:  From admission hpi: Felicia Sellers is a 80 y.o. female with medical history significant for Right lung cancer status post lobectomy, HTN, COPD, CHF, CAD, chronic hyponatremia who presents to the ED with shortness of breath, cough, muscle aches and generalized weakness.  She denied fever or chills.  She was brought in by EMS who recorded O2 sat of 87 on room air and she was placed on O2 via nasal cannula and treated with DuoNeb in route.  History taken from ER provider as patient was lethargic by the time of my assessment and unable to contribute to history   Assessment & Plan:   Principal Problem:   Acute respiratory failure with hypoxia ([place]) Active Problems:   Chronic hyponatremia   Hypertension   Community acquired pneumonia   Coronary artery disease involving native coronary artery of native heart without angina pectoris   COPD, moderate ([place])   Personal history of lung cancer   CAP (community acquired pneumonia)   Obstructive pneumonia    Acute respiratory failure with hypoxia ([place]) O2 sat was 87% on room air with EMS with no prior history of home O2 use. Suspect related to postobstructive pneumonia with some element of heart failure exacerbation. On 3 L [place] currently. Per nephew patient has o2 requirement, previously prescribed, she declined it.  - Continue O2 to keep sats over 90 and wean as tolerated - Treat etiology   Postobstructive pneumonia? Personal history of right lung cancer s/p lobectomy History copd Patient with cough and shortness of breath and myalgias and leukocytosis of 16,00. CT chest without contrast collapse and consolidation of the right lung with suspicion of obstructing lesion involving the right bronchus intermedius.  This is likely to represent primary lung cancer.  Mediastinal  lymphadenopathy likely metastatic, underlying emphysematous and chronic bronchitic changes. Procalcitonin low, this is less likely infectious. I had stopped abx on 9/19 and in discussion w/ Dr. Adelene Amas on 9/19, plan was bronchoscopy. Now it appears care has transferred to Dr. Keenan Bachelor who has started patient on zosyn and has ordered an infectious w/u - cont zosyn - continue prednisone, given hx copd, day 5 will be 9/21 - Mucolytic's - Supplemental O2 - have reached out to pulm to clarify inpatient plan - discussed w/ dr. Janese Banks of oncology, will need f/u with her as outpt, outpt PET. Requests mri of brain w/wo contrast which shows no acute process - PT advising SNF, bed search underway   Chronic diastolic heart failure Bnp moderately elevated to 200s. Received lasix in ed. Does not appear to be significantly fluid overload. TTE w/o significant cardiac dysfunction - cont metop - added lasix 20 - cardiology following  Paroxysmal a fib New a fib here, likely 2/2 pulmonary process. Rvr now improved. Tsh wnl - home metop increased to tartrate 50 bid. TTE w/o sig abnormality - hold anticoagulation given malignancy, plan for bronchoscopy  Hypokalemia Resolved w/ repletion  Alcohol use History very heavy drinking, pt reports last several years 1-2 drinks per day. No s/s withdrawal - monitor on ciwa   Acute metabolic encephalopathy Dementia Encephalopathy resolved. Presbycusis contributes - hold gabapentin   Chronic hyponatremia Likely 2/2 pulmonary process. Sodium 130.  Baseline 129-132 - Continue to monitor    Hypertension Here bp low normal - hold home amlod and lisin   History NICM - Continue atorvastatin and  metoprolol - Patient denied chest pain and EKG nonacute.  Troponin 14/8   COPD, moderate (HCC) - DuoNebs as needed   DVT prophylaxis: lovenox Code Status: full Family Communication: friend (listed as nephew but not a blood relation; she lives with him) updated  telephonically 9/20. she has no immediate family  Level of care: Progressive Cardiac Status is: Inpatient  Remains inpatient appropriate because:Inpatient level of care appropriate due to severity of illness  Dispo: The patient is from: Home              Anticipated d/c is to: snf              Patient currently is not medically stable to d/c.   Difficult to place patient No     Consultants:  Pulmonology, cardiology  Procedures: none  Antimicrobials:  Azithromycin/ceftriaxone 9/16> 9/19 Zosyn 9/19>   Subjective: Breathing slightly improved, no fevers  Objective: Vitals:   07/11/21 2347 07/12/21 0445 07/12/21 0727 07/12/21 1135  BP: 122/87 131/80 106/79 (!) 110/57  Pulse: (!) 106 (!) 108 (!) 104 92  Resp: 20 18 18 18   Temp: 97.9 F (36.6 C) 98.3 F (36.8 C) 98.2 F (36.8 C) 98.6 F (37 C)  TempSrc:   Oral Oral  SpO2: 100% 100% 93% 100%  Weight:      Height:        Intake/Output Summary (Last 24 hours) at 07/12/2021 1330 Last data filed at 07/12/2021 1000 Gross per 24 hour  Intake 480 ml  Output 600 ml  Net -120 ml   Filed Weights   07/08/21 2159 07/11/21 0453  Weight: 52.6 kg 45.1 kg    Examination:  General exam: Appears calm and comfortable. Chronically ill appearing Respiratory system: diffuse scattered rhonchi, decreased breath sounds in bases Cardiovascular system: S1 & S2 heard, RRR. No JVD, murmurs, rubs, gallops or clicks. No pedal edema. Gastrointestinal system: Abdomen is nondistended, soft and nontender. No organomegaly or masses felt. Normal bowel sounds heard. Central nervous system: Alert, confused. Moving all 4 extremities Extremities: Symmetric 5 x 5 power. Skin: No rashes, lesions or ulcers Psychiatry: calm, confused    Data Reviewed: I have personally reviewed following labs and imaging studies  CBC: Recent Labs  Lab 07/08/21 2206 07/09/21 0725 07/10/21 0729 07/11/21 0444  WBC 15.9* 13.5* 18.5* 14.4*  NEUTROABS 12.3*  --    --   --   HGB 13.4 14.1 13.4 13.1  HCT 39.5 39.2 37.7 38.6  MCV 92.5 92.7 92.0 91.9  PLT 270 267 299 809   Basic Metabolic Panel: Recent Labs  Lab 07/09/21 0017 07/10/21 0729 07/11/21 0444 07/12/21 0459  NA 129* 131* 130* 130*  K 3.7 3.2* 3.7 3.7  CL 92* 93* 95* 95*  CO2 24 29 28 29   GLUCOSE 100* 109* 72 87  BUN 12 15 18 13   CREATININE 0.75 0.62 0.64 0.55  CALCIUM 8.6* 8.6* 8.5* 8.6*  MG  --  1.9 2.0 2.1   GFR: Estimated Creatinine Clearance: 38.6 mL/min (by C-G formula based on SCr of 0.55 mg/dL). Liver Function Tests: Recent Labs  Lab 07/09/21 0017  AST 14*  ALT 10  ALKPHOS 118  BILITOT 1.3*  PROT 7.1  ALBUMIN 3.7   No results for input(s): LIPASE, AMYLASE in the last 168 hours. No results for input(s): AMMONIA in the last 168 hours. Coagulation Profile: Recent Labs  Lab 07/08/21 2302  INR 1.1   Cardiac Enzymes: No results for input(s): CKTOTAL, CKMB, CKMBINDEX, TROPONINI in the  last 168 hours. BNP (last 3 results) No results for input(s): PROBNP in the last 8760 hours. HbA1C: No results for input(s): HGBA1C in the last 72 hours. CBG: No results for input(s): GLUCAP in the last 168 hours. Lipid Profile: No results for input(s): CHOL, HDL, LDLCALC, TRIG, CHOLHDL, LDLDIRECT in the last 72 hours. Thyroid Function Tests: Recent Labs    07/10/21 0729  TSH 0.673   Anemia Panel: No results for input(s): VITAMINB12, FOLATE, FERRITIN, TIBC, IRON, RETICCTPCT in the last 72 hours. Urine analysis:    Component Value Date/Time   COLORURINE YELLOW 07/09/2021 [place] 07/09/2021 0857   APPEARANCEUR Clear 05/28/2014 0855   LABSPEC 1.010 07/09/2021 0857   LABSPEC 1.013 05/28/2014 0855   PHURINE 5.5 07/09/2021 [place] 07/09/2021 0857   GLUCOSEU Negative 05/28/2014 0855   HGBUR NEGATIVE 07/09/2021 0857   BILIRUBINUR NEGATIVE 07/09/2021 0857   BILIRUBINUR Negative 05/28/2014 0855   KETONESUR NEGATIVE 07/09/2021 0857    PROTEINUR NEGATIVE 07/09/2021 0857   NITRITE NEGATIVE 07/09/2021 0857   LEUKOCYTESUR NEGATIVE 07/09/2021 0857   LEUKOCYTESUR Negative 05/28/2014 0855   Sepsis Labs: @LABRCNTIP (procalcitonin:4,lacticidven:4)  ) Recent Results (from the past 240 hour(s))  Resp Panel by RT-PCR (Flu A&B, Covid) Nasopharyngeal Swab     Status: None   Collection Time: 07/08/21 10:27 PM   Specimen: Nasopharyngeal Swab; Nasopharyngeal(NP) swabs in vial transport medium  Result Value Ref Range Status   SARS Coronavirus 2 by RT PCR NEGATIVE NEGATIVE Final    Comment: (NOTE) SARS-CoV-2 target nucleic acids are NOT DETECTED.  The SARS-CoV-2 RNA is generally detectable in upper respiratory specimens during the acute phase of infection. The lowest concentration of SARS-CoV-2 viral copies this assay can detect is 138 copies/mL. A negative result does not preclude SARS-Cov-2 infection and should not be used as the sole basis for treatment or other patient management decisions. A negative result may occur with  improper specimen collection/handling, submission of specimen other than nasopharyngeal swab, presence of viral mutation(s) within the areas targeted by this assay, and inadequate number of viral copies(<138 copies/mL). A negative result must be combined with clinical observations, patient history, and epidemiological information. The expected result is Negative.  Fact Sheet for Patients:  EntrepreneurPulse.com.au  Fact Sheet for Healthcare Providers:  IncredibleEmployment.be  This test is no t yet approved or cleared by the [place] FDA and  has been authorized for detection and/or diagnosis of SARS-CoV-2 by FDA under an Emergency Use Authorization (EUA). This EUA will remain  in effect (meaning this test can be used) for the duration of the COVID-19 declaration under Section 564(b)(1) of the Act, 21 U.S.C.section 360bbb-3(b)(1), unless the authorization is  terminated  or revoked sooner.       Influenza A by PCR NEGATIVE NEGATIVE Final   Influenza B by PCR NEGATIVE NEGATIVE Final    Comment: (NOTE) The Xpert Xpress SARS-CoV-2/FLU/RSV plus assay is intended as an aid in the diagnosis of influenza from Nasopharyngeal swab specimens and should not be used as a sole basis for treatment. Nasal washings and aspirates are unacceptable for Xpert Xpress SARS-CoV-2/FLU/RSV testing.  Fact Sheet for Patients: EntrepreneurPulse.com.au  Fact Sheet for Healthcare Providers: IncredibleEmployment.be  This test is not yet approved or cleared by the [place] FDA and has been authorized for detection and/or diagnosis of SARS-CoV-2 by FDA under an Emergency Use Authorization (EUA). This EUA will remain in effect (meaning this test can be used) for the duration of the COVID-19 declaration  under Section 564(b)(1) of the Act, 21 U.S.C. section 360bbb-3(b)(1), unless the authorization is terminated or revoked.  Performed at [facility], [address] [place], [place] [address]   Blood culture (routine single)     Status: None (Preliminary result)   Collection Time: 07/08/21 11:04 PM   Specimen: BLOOD  Result Value Ref Range Status   Specimen Description BLOOD LEFT ANTECUBITAL  Final   Special Requests   Final    BOTTLES DRAWN AEROBIC AND ANAEROBIC Blood Culture adequate volume   Culture   Final    NO GROWTH 4 DAYS Performed at [facility] [place] [facility], [address], [place], [place] [address]    Report Status PENDING  Incomplete  Urine Culture     Status: Abnormal   Collection Time: 07/09/21  8:57 AM   Specimen: Urine, Random  Result Value Ref Range Status   Specimen Description   Final    URINE, RANDOM Performed at [facility], [address], [place], [place] [address]    Special Requests   Final    NONE Performed at [facility], [address] [place], [place]  [address]    Culture (A)  Final    <10,000 COLONIES/mL INSIGNIFICANT GROWTH Performed at [facility], [place] [address], [place], [place] [address]    Report Status 07/11/2021 FINAL  Final  MRSA Next Gen by PCR, Nasal     Status: None   Collection Time: 07/11/21  6:23 PM   Specimen: Nasal Mucosa; Nasal Swab  Result Value Ref Range Status   MRSA by PCR Next Gen NOT DETECTED NOT DETECTED Final    Comment: (NOTE) The GeneXpert MRSA Assay (FDA approved for NASAL specimens only), is one component of a comprehensive MRSA colonization surveillance program. It is not intended to diagnose MRSA infection nor to guide or monitor treatment for MRSA infections. Test performance is not FDA approved in patients less than 71 years old. Performed at [facility], [address] [place], [place] [address]   Culture, Respiratory w Gram Stain     Status: None (Preliminary result)   Collection Time: 07/11/21  9:47 PM   Specimen: Sputum; Respiratory  Result Value Ref Range Status   Specimen Description   Final    SPU Performed at [facility], [address] [place], [place] [address]    Special Requests   Final    NONE Performed at [facility], [address] [place], [place] [address]    Gram Stain   Final    MODERATE WBC PRESENT, PREDOMINANTLY PMN FEW YEAST Performed at [facility], [place] [address], [place], [place] [address]    Culture PENDING  Incomplete   Report Status PENDING  Incomplete         Radiology Studies: No results found.      Scheduled Meds:  atorvastatin  40 mg Oral Daily   docusate sodium  100 mg Oral Daily   enoxaparin (LOVENOX) injection  40 mg Subcutaneous C94W   folic acid  1 mg Oral Daily   metoprolol tartrate  12.5 mg Oral BID   multivitamin with minerals  1 tablet Oral Daily   predniSONE  20 mg Oral Q breakfast   sodium chloride  2 g Oral TID WC   thiamine  100 mg Oral Daily   Or   thiamine  100 mg  Intravenous Daily   Continuous Infusions:  piperacillin-tazobactam (ZOSYN)  IV 3.375 g (07/12/21 0514)  LOS: 3 days    Time spent: 30 min    Desma Maxim, MD [facility]   If 7PM-7AM, please contact night-coverage [email or web address] Password [email or web address] 07/12/2021, 1:30 PM

## 2021-07-12 NOTE — Consult Note (Signed)
Pharmacy Antibiotic Note  Felicia Sellers is a 80 y.o. female with PMH of right lung cancer status post lobectomy, HTN, COPD, CHF, CAD, chronic hyponatremia who was admitted on 07/08/2021 with pneumonia.  Pharmacy has been consulted for Zosyn dosing.  Afebrile since admission, WBC 13.5>18.5>14.4, procal <0.10  Bxc NG x4 days, resp panel, acid fast smear, acid fast culture pending, and aspergillus no result   Imaging does show complete opacification of the right hemithorax, but underlying malignancy cannot be excluded given history of lung cancer.   Plan: Zosyn --Will continue Zosyn @3 .375gms q8h  Will continue to monitor renal function and make adjustments as clinically indicated   Height: 5\' 1"  (154.9 cm) Weight: 45.1 kg (99 lb 6.8 oz) IBW/kg (Calculated) : 47.8  Temp (24hrs), Avg:98.2 F (36.8 C), Min:97.9 F (36.6 C), Max:98.4 F (36.9 C)  Recent Labs  Lab 07/08/21 2206 07/08/21 2302 07/09/21 0017 07/09/21 0027 07/09/21 0725 07/10/21 0729 07/11/21 0444 07/12/21 0459  WBC 15.9*  --   --   --  13.5* 18.5* 14.4*  --   CREATININE  --   --  0.75  --   --  0.62 0.64 0.55  LATICACIDVEN  --  1.4  --  1.3  --   --   --   --      Estimated Creatinine Clearance: 38.6 mL/min (by C-G formula based on SCr of 0.55 mg/dL).    Allergies  Allergen Reactions   Aspirin Other (See Comments)    High risk of bleeding    Antimicrobials this admission: Rocephin 2gms IV X 4 doses (9/16>9/18) Azithromycin 500mg  IV X 3 doses (9/16>9/18)  Microbiology results: 9/16 BCx: NG x4 days 9/16 UCx: <10,000 CC  9/16 SARS virus: negative 9/19 Resp panel, acid fast smear, acid fast culture: pending 9/20 Aspergillus: no result   Thank you for allowing pharmacy to be a part of this patient's care.  Narda Rutherford, PharmD Pharmacy Resident  07/12/2021 1:25 PM

## 2021-07-12 NOTE — Progress Notes (Signed)
Occupational Therapy Treatment Patient Details Name: Felicia Sellers MRN: [id number] DOB: 1940-11-19 Today's Date: 07/12/2021   History of present illness Pt is an 80 y/o F with PMH: COPD, CHF and h/o lung cancer with 1/3 lobe removed in 2004. Pt presented d/t SOB and cough. Chest CT negative for PE. MD assessment of CT includes: Collapse and consolidation of the right lung with suspicion of  obstructing lesion involving the right bronchus intermedius. This is  likely to represent primary lung cancer.   OT comments  Pt seen for OT treatment on this date. Upon arrival to room, pt awake and seated upright in bed with [name] doffed (SpO2 96% on RA). When this author asked pt why [name] doffed, pt reporting having difficulty communicating on phone with [name] donned. Pt also voicing frustrations regarding nephew, baseline hearing/vision difficulties, and having frequent staff in/out of room, however agreeable to OT tx/OOB mobility following MIN encouragement. Pt trial on RA this date, with SpO2 able to maintain >92% following walk to/from bathroom and standing hand hygiene. Pt reporting fatigue and SOB following minimal activity this date. Due to decreased activity tolerance and decreased safety awareness, pt would benefit from STR to improve activity tolerance, maximize return to PLOF, and minimize risk of future falls, injury, caregiver burden, and readmission. Discharge recommendation changed to SNF. At end of session, pt left in recliner, on RA (SpO2 >93%), with chair alarm on; RN informed.    Recommendations for follow up therapy are one component of a multi-disciplinary discharge planning process, led by the attending physician.  Recommendations may be updated based on patient status, additional functional criteria and insurance authorization.    Follow Up Recommendations  SNF    Equipment Recommendations  3 in 1 bedside commode;Tub/shower seat;Other (comment) (2ww)       Precautions / Restrictions  Precautions Precautions: Fall Restrictions Weight Bearing Restrictions: No       Mobility Bed Mobility Overal bed mobility: Needs Assistance Bed Mobility: Supine to Sit     Supine to sit: Supervision;HOB elevated     General bed mobility comments: increased time    Transfers Overall transfer level: Needs assistance Equipment used: None Transfers: Sit to/from Stand Sit to Stand: Min guard              Balance   Sitting-balance support: Feet supported;No upper extremity supported Sitting balance-Leahy Scale: Good Sitting balance - Comments: good sitting balance reaching within BOS while seated EOB   Standing balance support: No upper extremity supported;During functional activity Standing balance-Leahy Scale: Fair Standing balance comment: MIN GUARD to walk to/from bathroom, with pt frequently seeking furniture for UE support                           ADL either performed or assessed with clinical judgement   ADL Overall ADL's : Needs assistance/impaired     Grooming: Wash/dry hands;Min guard;Standing                               Functional mobility during ADLs: Min guard (to walk to/from bathroom, with pt frequently seeking furniture for UE support)        Cognition Arousal/Alertness: Awake/alert Behavior During Therapy: WFL for tasks assessed/performed Overall Cognitive Status: No family/caregiver present to determine baseline cognitive functioning  General Comments: Voicing frustrations regarding nephew, baseline hearing/vision difficulties, and having frequent staff in/out of room, however agreeable to OOB mobility              General Comments Upon arrival to room, pt with [name] doffed (with pt reporting having difficulty communicating with [name] donned) and SpO2 96% on RA. Following walk to/from bathroom and standing hand hygiene on RA, SpO2 92%. Pt left in recliner, on RA, in no  acute distress. RN informed    Pertinent Vitals/ Pain       Pain Assessment: No/denies pain         Frequency  Min 1X/week        Progress Toward Goals  OT Goals(current goals can now be found in the care plan section)  Progress towards OT goals: Progressing toward goals  Acute Rehab OT Goals Patient Stated Goal: to go home OT Goal Formulation: With patient Time For Goal Achievement: 07/23/21 Potential to Achieve Goals: Good  Plan Discharge plan remains appropriate;Frequency remains appropriate       AM-PAC OT "6 Clicks" Daily Activity     Outcome Measure   Help from another person eating meals?: None Help from another person taking care of personal grooming?: A Little Help from another person toileting, which includes using toliet, bedpan, or urinal?: A Lot Help from another person bathing (including washing, rinsing, drying)?: A Little Help from another person to put on and taking off regular upper body clothing?: A Little Help from another person to put on and taking off regular lower body clothing?: A Little 6 Click Score: 18    End of Session Equipment Utilized During Treatment: Gait belt  OT Visit Diagnosis: Unsteadiness on feet (R26.81);Muscle weakness (generalized) (M62.81)   Activity Tolerance Patient tolerated treatment well   Patient Left in chair;with call bell/phone within reach;with chair alarm set   Nurse Communication Mobility status;Other (comment) (vitals during mobility)        Time: 8127-5170 OT Time Calculation (min): 23 min  Charges: OT General Charges $OT Visit: 1 Visit OT Treatments $Self Care/Home Management : 23-37 mins  Fredirick Maudlin, OTR/L [place]

## 2021-07-12 NOTE — TOC Initial Note (Signed)
Transition of Care [facility]) - Initial/Assessment Note    Patient Details  Name: Felicia Sellers MRN: [id number] Date of Birth: 11/22/1940  Transition of Care [facility]) CM/SW Contact:    Alberteen Sam, LCSW Phone Number: 07/12/2021, 2:21 PM  Clinical Narrative:                  CSW spoke with patient regarding change in decision regarding dc planning. Patient confirms she now would like to go to rehab and is agreeable for CSW to fax out referrals to local facilities.   CSW has faxed out pending bed offers at this time.    Expected Discharge Plan: Skilled Nursing Facility Barriers to Discharge: Continued Medical Work up   Patient Goals and CMS Choice Patient states their goals for this hospitalization and ongoing recovery are:: to go home CMS Medicare.gov Compare Post Acute Care list provided to:: Patient Choice offered to / list presented to : Patient  Expected Discharge Plan and Services Expected Discharge Plan: [place]       Living arrangements for the past 2 months: Single Family Home                                      Prior Living Arrangements/Services Living arrangements for the past 2 months: Single Family Home Lives with:: Self Patient language and need for interpreter reviewed:: Yes Do you feel safe going back to the place where you live?: Yes      Need for Family Participation in Patient Care: Yes (Comment) Care giver support system in place?: Yes (comment)   Criminal Activity/Legal Involvement Pertinent to Current Situation/Hospitalization: No - Comment as needed  Activities of Daily Living Home Assistive Devices/Equipment: Walker (specify type) ADL Screening (condition at time of admission) Patient's cognitive ability adequate to safely complete daily activities?: Yes Is the patient deaf or have difficulty hearing?: Yes Does the patient have difficulty seeing, even when wearing glasses/contacts?: No Does the patient have difficulty  concentrating, remembering, or making decisions?: No Patient able to express need for assistance with ADLs?: Yes Does the patient have difficulty dressing or bathing?: Yes Independently performs ADLs?: Yes (appropriate for developmental age) Does the patient have difficulty walking or climbing stairs?: Yes Weakness of Legs: Both Weakness of Arms/Hands: None  Permission Sought/Granted Permission sought to share information with : Case Manager, Investment banker, corporate granted to share info w AGENCY: SNFs        Emotional Assessment Appearance:: Appears stated age Attitude/Demeanor/Rapport: Gracious Affect (typically observed): Calm Orientation: : Oriented to Self, Oriented to Place, Oriented to Situation, Oriented to  Time Alcohol / Substance Use: Not Applicable Psych Involvement: No (comment)  Admission diagnosis:  CAP (community acquired pneumonia) [J18.9] Obstructive pneumonia [J18.9] Sepsis with acute hypoxic respiratory failure without septic shock, due to unspecified organism ([place]) [A41.9, R65.20, J96.01] Patient Active Problem List   Diagnosis Date Noted   Acute respiratory failure with hypoxia ([place]) 07/09/2021   CAP (community acquired pneumonia) 07/09/2021   Obstructive pneumonia 07/09/2021   Coronary artery disease involving native coronary artery of native heart without angina pectoris 07/19/2020   COPD, moderate ([place]) 07/19/2020   Personal history of lung cancer 07/19/2020   Community acquired pneumonia 06/27/2020   Acute CHF (congestive heart failure) ([place]) 06/26/2020   Leukocytosis 06/26/2020   Hypoxia 06/26/2020   Thoracic aortic aneurysm without rupture ([place]) 08/16/2018  Kyphosis 08/07/2018   Hypertension 08/07/2018   Tobacco use 08/07/2018   Age-related osteoporosis without current pathological fracture 12/21/2016   Chronic hyponatremia 11/04/2015   Chronic low back pain 06/18/2015   Cardiomyopathy ([place]) 08/13/2014   Left bundle  branch block (LBBB) 07/23/2014   Pedal edema 07/23/2014   SOB (shortness of breath) on exertion 07/23/2014   PCP:  [facility]:   [facility] [place], [place] - [place] [facility] [address] AT [address] [place] [facility] [place] [address] Phone: [phone] Fax: [phone]     Social Determinants of Health (SDOH) Interventions    Readmission Risk Interventions No flowsheet data found.

## 2021-07-12 NOTE — Progress Notes (Signed)
Referral received. Per Dr. Janese Banks, pt will need outpatient PET scan and follow up with her 1-2 days after PET. Will start authorization process while pt admitted. Once discharged, will schedule pt for PET scan and follow up appts.

## 2021-07-13 LAB — BASIC METABOLIC PANEL
Anion gap: 6 (ref 5–15)
BUN: 14 mg/dL (ref 8–23)
CO2: 31 mmol/L (ref 22–32)
Calcium: 8.4 mg/dL — ABNORMAL LOW (ref 8.9–10.3)
Chloride: 92 mmol/L — ABNORMAL LOW (ref 98–111)
Creatinine, Ser: 0.73 mg/dL (ref 0.44–1.00)
GFR, Estimated: >60 mL/min (ref >60)
Glucose, Bld: 87 mg/dL (ref 70–99)
Potassium: 3.1 mmol/L — ABNORMAL LOW (ref 3.5–5.1)
Sodium: 129 mmol/L — ABNORMAL LOW (ref 135–145)

## 2021-07-13 LAB — MAGNESIUM: Magnesium: 2.1 mg/dL (ref 1.7–2.4)

## 2021-07-13 LAB — ACID FAST SMEAR (AFB, MYCOBACTERIA): Acid Fast Smear: NEGATIVE

## 2021-07-13 LAB — CULTURE, BLOOD (SINGLE)
Culture: NO GROWTH
Special Requests: ADEQUATE

## 2021-07-13 LAB — RESP PANEL BY RT-PCR (FLU A&B, COVID) ARPGX2
Influenza A by PCR: NEGATIVE
Influenza B by PCR: NEGATIVE
SARS Coronavirus 2 by RT PCR: NEGATIVE

## 2021-07-13 MED ORDER — SODIUM CHLORIDE 1 G PO TABS: 2.0000 g | ORAL_TABLET | Freq: Three times a day (TID) | ORAL | Status: DC

## 2021-07-13 MED ORDER — PREDNISONE 20 MG PO TABS: 20.0000 mg | ORAL_TABLET | Freq: Every day | ORAL | 0 refills | Status: AC

## 2021-07-13 MED ORDER — AMOXICILLIN-POT CLAVULANATE 875-125 MG PO TABS: 1.0000 | ORAL_TABLET | Freq: Two times a day (BID) | ORAL | Status: AC

## 2021-07-13 MED ORDER — POTASSIUM CHLORIDE CRYS ER 20 MEQ PO TBCR
40.0000 meq | EXTENDED_RELEASE_TABLET | Freq: Once | ORAL | Status: AC
  Administered 2021-07-13: 40 meq via ORAL
  Filled 2021-07-13: qty 2

## 2021-07-13 MED ORDER — AMOXICILLIN-POT CLAVULANATE 875-125 MG PO TABS
1.0000 | ORAL_TABLET | Freq: Two times a day (BID) | ORAL | Status: DC
  Administered 2021-07-13: 1 via ORAL
  Filled 2021-07-13: qty 1

## 2021-07-13 NOTE — Discharge Summary (Signed)
Physician Discharge Summary  Felicia Sellers NIO:[id number] DOB: 02/10/1941 DOA: 07/08/2021  PCP: [facility]  Admit date: 07/08/2021 Discharge date: 07/13/2021  Admitted From: Home Disposition: SNF  Recommendations for Outpatient Follow-up:  Follow up with PCP in 1-2 weeks Follow-up with pulmonary 1 week  Home Health: No Equipment/Devices: Oxygen via nasal cannula 2 L  Discharge Condition: Stable CODE STATUS: Full Diet recommendation: Regular  Brief/Interim Summary: Felicia Sellers is a 80 y.o. female with medical history significant for Right lung cancer status post lobectomy, HTN, COPD, CHF, CAD, chronic hyponatremia who presents to the ED with shortness of breath, cough, muscle aches and generalized weakness.  She denied fever or chills.  She was brought in by EMS who recorded O2 sat of 87 on room air and she was placed on O2 via nasal cannula and treated with DuoNeb in route.  History taken from ER provider as patient was lethargic by the time of my assessment and unable to contribute to history  Mental status did improve.  Patient with clinical evidence of postobstructive pneumonia including shortness of breath, myalgias, leukocytosis.  CT chest with contrast demonstrates collapse and consolidation of right lung.  Suspicion for obstructive lesion.  Initial plan was for inpatient bronchoscopy however decision was made to defer bronchoscopy to outpatient after completion of antibiotic course.  At time of discharge patient was on Zosyn.  We will discontinue and initiate Augmentin 875/125 for additional 5 days.  Discharge to skilled nursing facility.  Will need close outpatient follow-up with pulmonology and oncology.   Discharge Diagnoses:  Principal Problem:   Acute respiratory failure with hypoxia (HCC) Active Problems:   Chronic hyponatremia   Hypertension   Community acquired pneumonia   Coronary artery disease involving native coronary artery of native heart without  angina pectoris   COPD, moderate ([place])   Personal history of lung cancer   CAP (community acquired pneumonia)   Obstructive pneumonia  Acute respiratory failure with hypoxia ([place]) O2 sat was 87% on room air with EMS with no prior history of home O2 use. Suspect related to postobstructive pneumonia with some element of heart failure exacerbation.  On 2 L at time of discharge.  Will discharge with 2 L nasal cannula in place.  Outpatient follow-up with pulmonology   Postobstructive pneumonia? Personal history of right lung cancer s/p lobectomy History copd Patient with cough and shortness of breath and myalgias and leukocytosis of 16,00. CT chest without contrast collapse and consolidation of the right lung with suspicion of obstructing lesion involving the right bronchus intermedius.  This is likely to represent primary lung cancer.  Mediastinal lymphadenopathy likely metastatic, underlying emphysematous and chronic bronchitic changes. Procalcitonin low, this is less likely infectious. I had stopped abx on 9/19 and in discussion w/ Dr. Adelene Amas on 9/19, plan was bronchoscopy. Now it appears care has transferred to Dr. Keenan Bachelor who has started patient on zosyn and has ordered an infectious w/u Discussed with Dr. Lanney Gins on day of discharge.  Okay with discharge.  We will continue prednisone given history of COPD.  Transition to Augmentin to complete additional 5 days.  Will need close outpatient follow-up with oncology and pulmonary.  Dr. Janese Banks and Dr. Lanney Gins office contact information have been included in discharge packet.   Chronic diastolic heart failure Bnp moderately elevated to 200s. Received lasix in ed. Does not appear to be significantly fluid overload. TTE w/o significant cardiac dysfunction -Continue metoprolol.  No diuretics at time of discharge.  Outpatient follow-up with cardiology  Paroxysmal a fib New a fib here, likely 2/2 pulmonary process. Rvr now improved. Tsh wnl - home  metop increased to tartrate 50 bid. TTE w/o sig abnormality - hold anticoagulation given malignancy, plan for bronchoscopy -We will need outpatient follow-up with cardiology to discuss anticoagulation and further rate/rhythm control options   Hypokalemia Resolved w/ repletion   Alcohol use History very heavy drinking, pt reports last several years 1-2 drinks per day. No s/s withdrawal    Acute metabolic encephalopathy Dementia Encephalopathy resolved. Presbycusis contributes -Gabapentin held.  Can resume at time of discharge but caution regarding dosing and recommend to hold dose if patient is altered   Chronic hyponatremia Likely 2/2 pulmonary process. Sodium 130.  Baseline 129-132 - Did not respond to salt tablets.  Will discontinue time of discharge.  Outpatient follow-up   Hypertension Can resume home regimen   History NICM - Continue atorvastatin and metoprolol - Patient denied chest pain and EKG nonacute.  Troponin 14/8   COPD, moderate (HCC) - DuoNebs as needed  Discharge Instructions  Discharge Instructions     Diet - low sodium heart healthy   Complete by: As directed    Increase activity slowly   Complete by: As directed       Allergies as of 07/13/2021       Reactions   Aspirin Other (See Comments)   High risk of bleeding        Medication List     STOP taking these medications    alendronate 70 MG tablet Commonly known as: FOSAMAX   meloxicam 7.5 MG tablet Commonly known as: MOBIC   RABEprazole 20 MG tablet Commonly known as: ACIPHEX       TAKE these medications    Albuterol Sulfate 108 (90 Base) MCG/ACT Aepb Commonly known as: PROAIR RESPICLICK Inhale 2 puffs into the lungs every 6 (six) hours as needed.   amLODipine 10 MG tablet Commonly known as: NORVASC Take 10 mg by mouth daily.   amoxicillin-clavulanate 875-125 MG tablet Commonly known as: AUGMENTIN Take 1 tablet by mouth every 12 (twelve) hours for 5 days.    atorvastatin 40 MG tablet Commonly known as: LIPITOR Take 40 mg by mouth daily.   calcium carbonate 1250 (500 Ca) MG tablet Commonly known as: OS-CAL - dosed in mg of elemental calcium Take 1 tablet by mouth 2 (two) times daily with a meal.   Cholecalciferol 25 MCG (1000 UT) capsule Take 1,000 Units by mouth daily.   docusate sodium 100 MG capsule Commonly known as: COLACE Take 100 mg by mouth daily.   gabapentin 300 MG capsule Commonly known as: NEURONTIN Take 300 mg by mouth 3 (three) times daily.   ipratropium 0.03 % nasal spray Commonly known as: ATROVENT Place 2 sprays into the nose 2 (two) times daily as needed.   lisinopril 10 MG tablet Commonly known as: ZESTRIL Take 10 mg by mouth daily.   loperamide 2 MG capsule Commonly known as: IMODIUM Take 4 mg by mouth as needed for diarrhea or loose stools.   metoprolol succinate 25 MG 24 hr tablet Commonly known as: TOPROL-XL Take 25 mg by mouth daily.   predniSONE 20 MG tablet Commonly known as: DELTASONE Take 1 tablet (20 mg total) by mouth daily with breakfast for 7 days. Start taking on: July 14, 2021   sodium chloride 1 g tablet Take 2 tablets (2 g total) by mouth 3 (three) times daily with meals.        Follow-up Information  [place], [facility]. Schedule an appointment as soon as possible for a visit in 1 week(s).   Contact information: [place] [address] [phone]         Ottie Glazier, MD. Schedule an appointment as soon as possible for a visit in 1 week(s).   Specialty: Pulmonary Disease Contact information: [place] [address] [phone]                Allergies  Allergen Reactions   Aspirin Other (See Comments)    High risk of bleeding    Consultations: Cardiology Pulmonology Oncology   Procedures/Studies: CT CHEST WO CONTRAST  Result Date: 07/09/2021 CLINICAL DATA:  Abnormal x-ray with pleural effusion.  Shortness of breath today. EXAM: CT CHEST WITHOUT CONTRAST TECHNIQUE: Multidetector CT imaging of the chest was performed following the standard protocol without IV contrast. COMPARISON:  06/26/2020.  Chest radiograph 07/08/2021 FINDINGS: Cardiovascular: Mild cardiac enlargement. No pericardial effusion. Calcification in the mitral valve annulus, coronary arteries, and aorta. 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 Aneurysm of the lower descending thoracic aorta with maximal AP diameter of about 5.5 cm. Appearance is similar to previous study. Mediastinum/Nodes: Thyroid gland is unremarkable. Moderately prominent mediastinal lymph nodes with pretracheal nodes measuring up to about 1.8 cm short axis dimension. Esophagus is decompressed. Lungs/Pleura: Small right pleural effusion. Significant volume loss in the right lung with heterogeneous consolidation and air bronchograms throughout the right lung but most prominent in the right middle and right lower lungs. Minimal residual aeration in the right upper lung. There is a cut off sign to the right bronchus intermedius which may indicate obstructing mass or endobronchial process. The left lung demonstrates diffuse emphysematous changes with peripheral fibrosis and multiple pulmonary nodules which have developed since the prior study. Overall, changes are likely to represent a right hilar or endobronchial mass causing postobstructive change on the right and metastatic disease throughout both lungs. Upper Abdomen: Prominent vascular calcifications. No acute process identified in the visualized upper abdomen. Musculoskeletal: Degenerative changes in the spine. No destructive bone lesions. IMPRESSION: 1. Collapse and consolidation of the right lung with suspicion of obstructing lesion involving the right bronchus intermedius. This is likely to represent primary lung cancer. 2. Diffuse pulmonary nodule seen best in  the aerated left lung, likely metastatic disease. 3. Mediastinal lymphadenopathy is likely metastatic. 4. Underlying emphysematous and chronic bronchitic changes in the lungs with slight peripheral fibrosis. 5. 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 6. Diffuse aortic atherosclerosis. Electronically Signed   By: Lucienne Capers M.D.   On: 07/09/2021 01:23   MR BRAIN W WO CONTRAST  Result Date: 07/10/2021 CLINICAL DATA:  Hematologic malignancy, staging. EXAM: MRI HEAD WITHOUT AND WITH CONTRAST TECHNIQUE: Multiplanar, multiecho pulse sequences of the brain and surrounding structures were obtained without and with intravenous contrast. CONTRAST:  69mL GADAVIST GADOBUTROL 1 MMOL/ML IV SOLN COMPARISON:  Head CT 05/28/2014 FINDINGS: Brain: No enhancement or swelling to suggest metastatic disease. No acute infarction, hemorrhage, hydrocephalus, extra-axial collection or mass lesion. Chronic small vessel ischemia that is extensive in the hemispheric white matter and mild in the pons. Small remote bilateral cerebellar infarcts. Vascular: Normal flow voids. Skull and upper cervical spine: Normal marrow signal. Sinuses/Orbits: Negative. IMPRESSION: 1. No acute finding or evidence of metastatic disease. 2. Extensive chronic small vessel ischemia. Electronically Signed   By: Jorje Guild M.D.   On: 07/10/2021 11:39   DG  Chest Portable 1 View  Result Date: 07/08/2021 CLINICAL DATA:  feeling short of breath today Best films possible due to pt condition. CHf/COPd exacerbation.e val pulm congestion/infiltrate EXAM: PORTABLE CHEST 1 VIEW COMPARISON:  Chest x-ray 06/26/2020 FINDINGS: The heart and mediastinal contours are grossly unchanged. Aortic calcification. Right lung pulmonary staples noted. Almost complete opacification of the right hemithorax. Increased interstitial markings bilaterally. Likely some component of right pleural effusion. No left pleural effusion. No pneumothorax. No acute  osseous abnormality. IMPRESSION: Almost complete opacification of the right hemithorax as well as increased interstitial markings bilaterally. Likely some component of right pleural effusion. Findings suggestive of infection/inflammation. Underlying malignancy not excluded. Recommend CT chest with intravenous contrast for further evaluation. Electronically Signed   By: Iven Finn M.D.   On: 07/08/2021 22:48   ECHOCARDIOGRAM COMPLETE  Result Date: 07/09/2021    ECHOCARDIOGRAM REPORT   Patient Name:   Felicia Sellers Date of Exam: 07/09/2021 Medical Rec #:  [id number]    Height:       61.0 in Accession #:    [id number]   Weight:       116.0 lb Date of Birth:  23-Jul-1941    BSA:          1.498 m Patient Age:    65 years     BP:           114/58 mmHg Patient Gender: F            HR:           116 bpm. Exam Location:  [facility] Procedure: 2D Echo, Cardiac Doppler and Color Doppler Indications:     CHF-Acute Diastolic W58.09  History:         Patient has prior history of Echocardiogram examinations. CHF,                  CAD; Risk Factors:Hypertension.  Sonographer:     Alyse Low Roar Referring Phys:  [id number] Athena Masse Diagnosing Phys: Isaias Cowman MD IMPRESSIONS  1. Left ventricular ejection fraction, by estimation, is 55 to 60%. The left ventricle has normal function. The left ventricle has no regional wall motion abnormalities. Left ventricular diastolic parameters are indeterminate.  2. Right ventricular systolic function is normal. The right ventricular size is normal.  3. The mitral valve is normal in structure. Mild mitral valve regurgitation. No evidence of mitral stenosis.  4. Tricuspid valve regurgitation is moderate.  5. The aortic valve is normal in structure. Aortic valve regurgitation is not visualized. No aortic stenosis is present.  6. The inferior vena cava is normal in size with greater than 50% respiratory variability, suggesting right atrial pressure of 3 mmHg. FINDINGS  Left Ventricle: Left  ventricular ejection fraction, by estimation, is 55 to 60%. The left ventricle has normal function. The left ventricle has no regional wall motion abnormalities. The left ventricular internal cavity size was normal in size. There is  no left ventricular hypertrophy. Left ventricular diastolic parameters are indeterminate. Right Ventricle: The right ventricular size is normal. No increase in right ventricular wall thickness. Right ventricular systolic function is normal. Left Atrium: Left atrial size was normal in size. Right Atrium: Right atrial size was normal in size. Pericardium: There is no evidence of pericardial effusion. Mitral Valve: The mitral valve is normal in structure. Mild mitral valve regurgitation. No evidence of mitral valve stenosis. Tricuspid Valve: The tricuspid valve is normal in structure. Tricuspid valve regurgitation is moderate . No evidence of tricuspid stenosis. Aortic  Valve: The aortic valve is normal in structure. Aortic valve regurgitation is not visualized. No aortic stenosis is present. Aortic valve peak gradient measures 7.1 mmHg. Pulmonic Valve: The pulmonic valve was normal in structure. Pulmonic valve regurgitation is not visualized. No evidence of pulmonic stenosis. Aorta: The aortic root is normal in size and structure. Venous: The inferior vena cava is normal in size with greater than 50% respiratory variability, suggesting right atrial pressure of 3 mmHg. IAS/Shunts: No atrial level shunt detected by color flow Doppler.  LEFT VENTRICLE PLAX 2D LVIDd:         4.10 cm  Diastology LVIDs:         2.90 cm  LV e' medial:    9.03 cm/s LV PW:         1.10 cm  LV E/e' medial:  17.7 LV IVS:        1.20 cm  LV e' lateral:   6.31 cm/s LVOT diam:     1.70 cm  LV E/e' lateral: 25.4 LVOT Area:     2.27 cm  RIGHT VENTRICLE RV Basal diam:  2.40 cm RV Mid diam:    2.10 cm RV S prime:     12.20 cm/s TAPSE (M-mode): 1.5 cm LEFT ATRIUM             Index       RIGHT ATRIUM           Index LA diam:         3.70 cm 2.47 cm/m  RA Area:     14.30 cm LA Vol (A2C):   41.2 ml 27.50 ml/m RA Volume:   32.30 ml  21.56 ml/m LA Vol (A4C):   31.9 ml 21.29 ml/m LA Biplane Vol: 36.7 ml 24.49 ml/m  AORTIC VALVE                PULMONIC VALVE AV Area (Vmax): 1.93 cm    PV Vmax:        1.22 m/s AV Vmax:        133.00 cm/s PV Peak grad:   6.0 mmHg AV Peak Grad:   7.1 mmHg    RVOT Peak grad: 2 mmHg LVOT Vmax:      113.00 cm/s  AORTA Ao Root diam: 2.70 cm MITRAL VALVE                TRICUSPID VALVE MV Area (PHT): 6.83 cm     TR Peak grad:   35.0 mmHg MV Decel Time: 111 msec     TR Vmax:        296.00 cm/s MV E velocity: 160.00 cm/s                             SHUNTS                             Systemic Diam: 1.70 cm Isaias Cowman MD Electronically signed by Isaias Cowman MD Signature Date/Time: 07/09/2021/1:24:30 PM    Final    (Echo, Carotid, EGD, Colonoscopy, ERCP)    Subjective: Seen and examined on the day of discharge.  Stable no distress.  Agreeable to skilled nursing facility placement.  Discharge Exam: Vitals:   07/13/21 0716 07/13/21 1135  BP: 115/75 110/72  Pulse: (!) 107 74  Resp: 18 18  Temp: 99 F (37.2 C) 98 F (36.7 C)  SpO2: 99% 100%  Vitals:   07/12/21 2314 07/13/21 0341 07/13/21 0716 07/13/21 1135  BP: (!) 139/92 134/79 115/75 110/72  Pulse: 97 92 (!) 107 74  Resp: 20 18 18 18   Temp: 98.6 F (37 C) 98.2 F (36.8 C) 99 F (37.2 C) 98 F (36.7 C)  TempSrc:   Oral Oral  SpO2: 100% 96% 99% 100%  Weight:      Height:        General: No acute distress.  Appears frail Cardiovascular: Regular rate, irregular rhythm, no murmurs, no pedal edema Respiratory: Bilateral scattered crackles.  Normal work of breathing.  2 L Abdominal: Soft, NT, ND, bowel sounds + Extremities: no edema, no cyanosis    The results of significant diagnostics from this hospitalization (including imaging, microbiology, ancillary and laboratory) are listed below for reference.      Microbiology: Recent Results (from the past 240 hour(s))  Resp Panel by RT-PCR (Flu A&B, Covid) Nasopharyngeal Swab     Status: None   Collection Time: 07/08/21 10:27 PM   Specimen: Nasopharyngeal Swab; Nasopharyngeal(NP) swabs in vial transport medium  Result Value Ref Range Status   SARS Coronavirus 2 by RT PCR NEGATIVE NEGATIVE Final    Comment: (NOTE) SARS-CoV-2 target nucleic acids are NOT DETECTED.  The SARS-CoV-2 RNA is generally detectable in upper respiratory specimens during the acute phase of infection. The lowest concentration of SARS-CoV-2 viral copies this assay can detect is 138 copies/mL. A negative result does not preclude SARS-Cov-2 infection and should not be used as the sole basis for treatment or other patient management decisions. A negative result may occur with  improper specimen collection/handling, submission of specimen other than nasopharyngeal swab, presence of viral mutation(s) within the areas targeted by this assay, and inadequate number of viral copies(<138 copies/mL). A negative result must be combined with clinical observations, patient history, and epidemiological information. The expected result is Negative.  Fact Sheet for Patients:  EntrepreneurPulse.com.au  Fact Sheet for Healthcare Providers:  IncredibleEmployment.be  This test is no t yet approved or cleared by the [place] FDA and  has been authorized for detection and/or diagnosis of SARS-CoV-2 by FDA under an Emergency Use Authorization (EUA). This EUA will remain  in effect (meaning this test can be used) for the duration of the COVID-19 declaration under Section 564(b)(1) of the Act, 21 U.S.C.section 360bbb-3(b)(1), unless the authorization is terminated  or revoked sooner.       Influenza A by PCR NEGATIVE NEGATIVE Final   Influenza B by PCR NEGATIVE NEGATIVE Final    Comment: (NOTE) The Xpert Xpress SARS-CoV-2/FLU/RSV plus assay is  intended as an aid in the diagnosis of influenza from Nasopharyngeal swab specimens and should not be used as a sole basis for treatment. Nasal washings and aspirates are unacceptable for Xpert Xpress SARS-CoV-2/FLU/RSV testing.  Fact Sheet for Patients: EntrepreneurPulse.com.au  Fact Sheet for Healthcare Providers: IncredibleEmployment.be  This test is not yet approved or cleared by the [place] FDA and has been authorized for detection and/or diagnosis of SARS-CoV-2 by FDA under an Emergency Use Authorization (EUA). This EUA will remain in effect (meaning this test can be used) for the duration of the COVID-19 declaration under Section 564(b)(1) of the Act, 21 U.S.C. section 360bbb-3(b)(1), unless the authorization is terminated or revoked.  Performed at [facility], [address] [place], [place] [address]   Blood culture (routine single)     Status: None   Collection Time: 07/08/21 11:04 PM   Specimen: BLOOD  Result  Value Ref Range Status   Specimen Description BLOOD LEFT ANTECUBITAL  Final   Special Requests   Final    BOTTLES DRAWN AEROBIC AND ANAEROBIC Blood Culture adequate volume   Culture   Final    NO GROWTH 5 DAYS Performed at [facility], [address] [place], [place] [address]    Report Status 07/13/2021 FINAL  Final  Urine Culture     Status: Abnormal   Collection Time: 07/09/21  8:57 AM   Specimen: Urine, Random  Result Value Ref Range Status   Specimen Description   Final    URINE, RANDOM Performed at [facility], [address] [place], [place] [address]    Special Requests   Final    NONE Performed at [facility], [address], [place], [place] [address]    Culture (A)  Final    <10,000 COLONIES/mL INSIGNIFICANT GROWTH Performed at [facility], [place] [address], [place], [place] [address]    Report Status 07/11/2021 FINAL  Final  MRSA Next Gen by PCR,  Nasal     Status: None   Collection Time: 07/11/21  6:23 PM   Specimen: Nasal Mucosa; Nasal Swab  Result Value Ref Range Status   MRSA by PCR Next Gen NOT DETECTED NOT DETECTED Final    Comment: (NOTE) The GeneXpert MRSA Assay (FDA approved for NASAL specimens only), is one component of a comprehensive MRSA colonization surveillance program. It is not intended to diagnose MRSA infection nor to guide or monitor treatment for MRSA infections. Test performance is not FDA approved in patients less than 50 years old. Performed at [facility], [address] [place], [place] [address]   Culture, Respiratory w Gram Stain     Status: None (Preliminary result)   Collection Time: 07/11/21  9:47 PM   Specimen: Sputum; Respiratory  Result Value Ref Range Status   Specimen Description   Final    SPU Performed at [facility], [address] [place], [place] [address]    Special Requests   Final    NONE Performed at [facility], [address] [place], [place] [address]    Gram Stain   Final    MODERATE WBC PRESENT, PREDOMINANTLY PMN FEW YEAST    Culture   Final    CULTURE REINCUBATED FOR BETTER GROWTH Performed at [facility], [place] [address] [place], [place] [address]    Report Status PENDING  Incomplete     Labs: BNP (last 3 results) Recent Labs    07/08/21 2206  BNP 284.1*   Basic Metabolic Panel: Recent Labs  Lab 07/09/21 0017 07/10/21 0729 07/11/21 0444 07/12/21 0459 07/13/21 0515  NA 129* 131* 130* 130* 129*  K 3.7 3.2* 3.7 3.7 3.1*  CL 92* 93* 95* 95* 92*  CO2 24 29 28 29 31   GLUCOSE 100* 109* 72 87 87  BUN 12 15 18 13 14   CREATININE 0.75 0.62 0.64 0.55 0.73  CALCIUM 8.6* 8.6* 8.5* 8.6* 8.4*  MG  --  1.9 2.0 2.1 2.1   Liver Function Tests: Recent Labs  Lab 07/09/21 0017  AST 14*  ALT 10  ALKPHOS 118  BILITOT 1.3*  PROT 7.1  ALBUMIN 3.7   No results for input(s): LIPASE, AMYLASE in the last 168 hours. No  results for input(s): AMMONIA in the last 168 hours. CBC: Recent Labs  Lab 07/08/21 2206 07/09/21 0725 07/10/21 0729 07/11/21 0444  WBC 15.9* 13.5*  18.5* 14.4*  NEUTROABS 12.3*  --   --   --   HGB 13.4 14.1 13.4 13.1  HCT 39.5 39.2 37.7 38.6  MCV 92.5 92.7 92.0 91.9  PLT 270 267 299 311   Cardiac Enzymes: No results for input(s): CKTOTAL, CKMB, CKMBINDEX, TROPONINI in the last 168 hours. BNP: Invalid input(s): POCBNP CBG: No results for input(s): GLUCAP in the last 168 hours. D-Dimer No results for input(s): DDIMER in the last 72 hours. Hgb A1c No results for input(s): HGBA1C in the last 72 hours. Lipid Profile No results for input(s): CHOL, HDL, LDLCALC, TRIG, CHOLHDL, LDLDIRECT in the last 72 hours. Thyroid function studies No results for input(s): TSH, T4TOTAL, T3FREE, THYROIDAB in the last 72 hours.  Invalid input(s): FREET3 Anemia work up No results for input(s): VITAMINB12, FOLATE, FERRITIN, TIBC, IRON, RETICCTPCT in the last 72 hours. Urinalysis    Component Value Date/Time   COLORURINE YELLOW 07/09/2021 [place] 07/09/2021 0857   APPEARANCEUR Clear 05/28/2014 0855   LABSPEC 1.010 07/09/2021 0857   LABSPEC 1.013 05/28/2014 0855   PHURINE 5.5 07/09/2021 0857   GLUCOSEU NEGATIVE 07/09/2021 0857   GLUCOSEU Negative 05/28/2014 0855   HGBUR NEGATIVE 07/09/2021 0857   BILIRUBINUR NEGATIVE 07/09/2021 0857   BILIRUBINUR Negative 05/28/2014 0855   KETONESUR NEGATIVE 07/09/2021 0857   PROTEINUR NEGATIVE 07/09/2021 0857   NITRITE NEGATIVE 07/09/2021 0857   LEUKOCYTESUR NEGATIVE 07/09/2021 0857   LEUKOCYTESUR Negative 05/28/2014 0855   Sepsis Labs Invalid input(s): PROCALCITONIN,  WBC,  LACTICIDVEN Microbiology Recent Results (from the past 240 hour(s))  Resp Panel by RT-PCR (Flu A&B, Covid) Nasopharyngeal Swab     Status: None   Collection Time: 07/08/21 10:27 PM   Specimen: Nasopharyngeal Swab; Nasopharyngeal(NP) swabs in vial transport medium   Result Value Ref Range Status   SARS Coronavirus 2 by RT PCR NEGATIVE NEGATIVE Final    Comment: (NOTE) SARS-CoV-2 target nucleic acids are NOT DETECTED.  The SARS-CoV-2 RNA is generally detectable in upper respiratory specimens during the acute phase of infection. The lowest concentration of SARS-CoV-2 viral copies this assay can detect is 138 copies/mL. A negative result does not preclude SARS-Cov-2 infection and should not be used as the sole basis for treatment or other patient management decisions. A negative result may occur with  improper specimen collection/handling, submission of specimen other than nasopharyngeal swab, presence of viral mutation(s) within the areas targeted by this assay, and inadequate number of viral copies(<138 copies/mL). A negative result must be combined with clinical observations, patient history, and epidemiological information. The expected result is Negative.  Fact Sheet for Patients:  EntrepreneurPulse.com.au  Fact Sheet for Healthcare Providers:  IncredibleEmployment.be  This test is no t yet approved or cleared by the [place] FDA and  has been authorized for detection and/or diagnosis of SARS-CoV-2 by FDA under an Emergency Use Authorization (EUA). This EUA will remain  in effect (meaning this test can be used) for the duration of the COVID-19 declaration under Section 564(b)(1) of the Act, 21 U.S.C.section 360bbb-3(b)(1), unless the authorization is terminated  or revoked sooner.       Influenza A by PCR NEGATIVE NEGATIVE Final   Influenza B by PCR NEGATIVE NEGATIVE Final    Comment: (NOTE) The Xpert Xpress SARS-CoV-2/FLU/RSV plus assay is intended as an aid in the diagnosis of influenza from Nasopharyngeal swab specimens and should not be used as a sole basis for treatment. Nasal washings and aspirates are unacceptable for Xpert Xpress SARS-CoV-2/FLU/RSV testing.  Fact Sheet for  Patients: EntrepreneurPulse.com.au  Fact Sheet for Healthcare Providers: IncredibleEmployment.be  This test is not yet approved or cleared by the [place] FDA and has been authorized for detection and/or diagnosis of SARS-CoV-2 by FDA under an Emergency Use Authorization (EUA). This EUA will remain in effect (meaning this test can be used) for the duration of the COVID-19 declaration under Section 564(b)(1) of the Act, 21 U.S.C. section 360bbb-3(b)(1), unless the authorization is terminated or revoked.  Performed at [facility], [address] [place], [place] [address]   Blood culture (routine single)     Status: None   Collection Time: 07/08/21 11:04 PM   Specimen: BLOOD  Result Value Ref Range Status   Specimen Description BLOOD LEFT ANTECUBITAL  Final   Special Requests   Final    BOTTLES DRAWN AEROBIC AND ANAEROBIC Blood Culture adequate volume   Culture   Final    NO GROWTH 5 DAYS Performed at [facility], [address], [place], [place] [address]    Report Status 07/13/2021 FINAL  Final  Urine Culture     Status: Abnormal   Collection Time: 07/09/21  8:57 AM   Specimen: Urine, Random  Result Value Ref Range Status   Specimen Description   Final    URINE, RANDOM Performed at [facility], [address], [place], [place] [address]    Special Requests   Final    NONE Performed at [facility], [address], [place], [place] [address]    Culture (A)  Final    <10,000 COLONIES/mL INSIGNIFICANT GROWTH Performed at [facility], [place] [address] [place], [place] [address]    Report Status 07/11/2021 FINAL  Final  MRSA Next Gen by PCR, Nasal     Status: None   Collection Time: 07/11/21  6:23 PM   Specimen: Nasal Mucosa; Nasal Swab  Result Value Ref Range Status   MRSA by PCR Next Gen NOT DETECTED NOT DETECTED Final    Comment: (NOTE) The GeneXpert MRSA Assay (FDA approved for  NASAL specimens only), is one component of a comprehensive MRSA colonization surveillance program. It is not intended to diagnose MRSA infection nor to guide or monitor treatment for MRSA infections. Test performance is not FDA approved in patients less than 36 years old. Performed at [facility], [address] [place], [place] [address]   Culture, Respiratory w Gram Stain     Status: None (Preliminary result)   Collection Time: 07/11/21  9:47 PM   Specimen: Sputum; Respiratory  Result Value Ref Range Status   Specimen Description   Final    SPU Performed at [facility], [address] [place], [place] [address]    Special Requests   Final    NONE Performed at [facility], [address] [place], [place] [address]    Gram Stain   Final    MODERATE WBC PRESENT, PREDOMINANTLY PMN FEW YEAST    Culture   Final    CULTURE REINCUBATED FOR BETTER GROWTH Performed at [facility], [place] [address], [place], [place] [address]    Report Status PENDING  Incomplete     Time coordinating discharge: Over 30 minutes  SIGNED:   Sidney Ace, MD  [facility] 07/13/2021, 12:10 PM Pager   If 7PM-7AM, please contact night-coverage

## 2021-07-13 NOTE — TOC Transition Note (Signed)
Transition of Care [facility]) - CM/SW Discharge Note   Patient Details  Name: Felicia Sellers MRN: [id number] Date of Birth: 14-Aug-1941  Transition of [facility]) CM/SW Contact:  Alberteen Sam, LCSW Phone Number: 07/13/2021, 2:05 PM   Clinical Narrative:     CSW spoke with patient, aware of discharge today to [facility] via EMS. Nurse to call report to [phone] Room 24A, dc summary sent to facility.   Final next level of care: Skilled Nursing Facility Barriers to Discharge: No Barriers Identified   Patient Goals and CMS Choice Patient states their goals for this hospitalization and ongoing recovery are:: to go home CMS Medicare.gov Compare Post Acute Care list provided to:: Patient Choice offered to / list presented to : Patient  Discharge Placement              [facility] Patient to be transferred to facility by: [facility]   Patient and family notified of of transfer: 07/13/21  Discharge Plan and Services                                     Social Determinants of Health (SDOH) Interventions     Readmission Risk Interventions No flowsheet data found.

## 2021-07-13 NOTE — Progress Notes (Signed)
Pulmonary Medicine          Date: 07/13/2021,   MRN# [id number] Felicia Sellers [birth year]      HISTORY OF PRESENT ILLNESS     As per admission h/p Felicia Sellers is a 80 y.o. female with medical history significant for Right lung cancer status post lobectomy, HTN, COPD, CHF, CAD, chronic hyponatremia who presents to the ED with shortness of breath, cough, muscle aches and generalized weakness.  She denied fever or chills.  She was brought in by EMS who recorded O2 sat of 87 on room air and she was placed on O2 via nasal cannula and treated with DuoNeb in route.  History taken from ER provider as patient was lethargic by the time of my assessment and unable to contribute to history On arrival, afebrile, BP 114/58 with pulse of 72, respirations 22 with O2 sat 95% on O2 at 2 L Blood work with WBC 16,000, lactic acid 1.4.  Troponin 14/8, BNP 288.  Sodium 129.  Labs otherwise unremarkable.  COVID and flu negative Imaging: Chest x-ray with complete opacification of right hemithorax CT chest without contrast collapse and consolidation of the right lung with suspicion of obstructing lesion involving the right bronchus intermedius.  This is likely to represent primary lung cancer.  Mediastinal lymphadenopathy likely metastatic, underlying emphysematous and chronic bronchitic changes  07/11/21- reviewed CT chest with patient , will likely need outpatient workup due to severe emphysema with overlying consolidated infiltrate of right lung and likely need 3 months for recovery prior to evaluation residual.  In the intermim will obtain sputum cytology and hypertonic saline to expectorate endoluminal debris.   07/12/21- patient is improved shes weaning on O2 and expectorating phlegm better reports breathing is stronger.  Reviewed plan with Dr Si Raider and we will try to dc home then plan outpatient malignancy workup.  Patient is thankful and agreeable.  She reports transportaion issues, ill place [facility] consult  to help her.   07/13/21- patient cleared for dc home with plan for follow up on outpatient for possible bronchoscopic evalutation, she is now off supplemental oxygen.   PAST MEDICAL HISTORY   Past Medical History:  Diagnosis Date   Cancer ([place])    right lung removed - lung cancer 1/3 lung removed    CHF (congestive heart failure) ([facility])    COPD (chronic obstructive pulmonary disease) ([place])    Coronary artery disease    History of stomach ulcers    Hypertension    Pneumonia      SURGICAL HISTORY   Past Surgical History:  Procedure Laterality Date   LUNG REMOVAL, PARTIAL Left      FAMILY HISTORY   Family History  Problem Relation Age of Onset   Breast cancer Neg Hx      SOCIAL HISTORY   Social History   Tobacco Use   Smoking status: Every Day    Packs/day: 1.00    Years: 66.00    Pack years: 66.00    Types: Cigarettes    Last attempt to quit: 06/26/2020    Years since quitting: 1.0   Smokeless tobacco: Never  Vaping Use   Vaping Use: Never used  Substance Use Topics   Alcohol use: Not Currently    Comment: use to drink alot ( vodka) 6 months ago   Drug use: Never     MEDICATIONS    Home Medication:    Current Medication:  Current Facility-Administered Medications:    acetaminophen (TYLENOL) tablet 650  mg, 650 mg, Oral, Q6H PRN **OR** acetaminophen (TYLENOL) suppository 650 mg, 650 mg, Rectal, Q6H PRN, Athena Masse, MD   amoxicillin-clavulanate (AUGMENTIN) 875-125 MG per tablet 1 tablet, 1 tablet, Oral, Q12H, Sreenath, Sudheer B, MD, 1 tablet at 07/13/21 1255   atorvastatin (LIPITOR) tablet 40 mg, 40 mg, Oral, Daily, Wouk, Ailene Rud, MD, 40 mg at 07/13/21 0824   enoxaparin (LOVENOX) injection 40 mg, 40 mg, Subcutaneous, Q24H, Athena Masse, MD, 40 mg at 83/25/49 8264   folic acid (FOLVITE) tablet 1 mg, 1 mg, Oral, Daily, Wouk, Ailene Rud, MD, 1 mg at 07/13/21 1583   influenza vaccine adjuvanted (FLUAD) injection 0.5 mL, 0.5 mL, Intramuscular,  Prior to discharge, Wouk, Ailene Rud, MD   ipratropium-albuterol (DUONEB) 0.5-2.5 (3) MG/3ML nebulizer solution 3 mL, 3 mL, Nebulization, Q4H PRN, Athena Masse, MD   metoprolol tartrate (LOPRESSOR) tablet 12.5 mg, 12.5 mg, Oral, BID, Ottie Glazier, MD, 12.5 mg at 07/13/21 0940   multivitamin with minerals tablet 1 tablet, 1 tablet, Oral, Daily, Wouk, Ailene Rud, MD, 1 tablet at 07/13/21 0824   ondansetron (ZOFRAN) tablet 4 mg, 4 mg, Oral, Q6H PRN **OR** ondansetron (ZOFRAN) injection 4 mg, 4 mg, Intravenous, Q6H PRN, Athena Masse, MD   predniSONE (DELTASONE) tablet 20 mg, 20 mg, Oral, Q breakfast, Ottie Glazier, MD, 20 mg at 07/13/21 7680   sodium chloride tablet 2 g, 2 g, Oral, TID WC, Ottie Glazier, MD, 2 g at 07/13/21 1255   thiamine tablet 100 mg, 100 mg, Oral, Daily, 100 mg at 07/13/21 8811 **OR** thiamine (B-1) injection 100 mg, 100 mg, Intravenous, Daily, Wouk, Ailene Rud, MD, 100 mg at 07/10/21 1205    ALLERGIES   Aspirin     REVIEW OF SYSTEMS    Review of Systems:  Gen:  Denies  fever, sweats, chills weigh loss  HEENT: Denies blurred vision, double vision, ear pain, eye pain, hearing loss, nose bleeds, sore throat Cardiac:  No dizziness, chest pain or heaviness, chest tightness,edema Resp:   + cough or sputum porduction, shortness of breath,wheezing, hemoptysis,  Gi: Denies swallowing difficulty, stomach pain, nausea or vomiting, diarrhea, constipation, bowel incontinence Gu:  Denies bladder incontinence, burning urine Ext:   Denies Joint pain, stiffness or swelling Skin: Denies  skin rash, easy bruising or bleeding or hives Endoc:  Denies polyuria, polydipsia , polyphagia or weight change Psych:   Denies depression, insomnia or hallucinations   Other:  All other systems negative   VS: BP 110/72 (BP Location: Right Arm)   Pulse 74   Temp 98 F (36.7 C) (Oral)   Resp 18   Ht _0  (1.549 m)   Wt 45.1 kg   SpO2 100%   BMI 18.79 kg/m       PHYSICAL EXAM    GENERAL:NAD, no fevers, chills, no weakness no fatigue HEAD: Normocephalic, atraumatic.  EYES: Pupils equal, round, reactive to light. Extraocular muscles intact. No scleral icterus.  MOUTH: Moist mucosal membrane. Dentition intact. No abscess noted.  EAR, NOSE, THROAT: Clear without exudates. No external lesions.  NECK: Supple. No thyromegaly. No nodules. No JVD.  PULMONARY: Diffuse coarse rhonchi right sided +wheezes CARDIOVASCULAR: S1 and S2. Regular rate and rhythm. No murmurs, rubs, or gallops. No edema. Pedal pulses 2+ bilaterally.  GASTROINTESTINAL: Soft, nontender, nondistended. No masses. Positive bowel sounds. No hepatosplenomegaly.  MUSCULOSKELETAL: No swelling, clubbing, or edema. Range of motion full in all extremities.  NEUROLOGIC: Cranial nerves II through XII except hard of hearing. No gross  focal neurological deficits. Sensation intact. Reflexes intact.  SKIN: No ulceration, lesions, rashes, or cyanosis. Skin warm and dry. Turgor intact.  PSYCHIATRIC: Mood, affect within normal limits. The patient is awake, alert and oriented x 3. Insight, judgment intact.       IMAGING    CT CHEST WO CONTRAST  Result Date: 07/09/2021 CLINICAL DATA:  Abnormal x-ray with pleural effusion. Shortness of breath today. EXAM: CT CHEST WITHOUT CONTRAST TECHNIQUE: Multidetector CT imaging of the chest was performed following the standard protocol without IV contrast. COMPARISON:  06/26/2020.  Chest radiograph 07/08/2021 FINDINGS: Cardiovascular: Mild cardiac enlargement. No pericardial effusion. Calcification in the mitral valve annulus, coronary arteries, and aorta. 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 Aneurysm of the lower descending thoracic aorta with maximal AP diameter of about 5.5 cm. Appearance is similar to previous study. Mediastinum/Nodes: Thyroid gland is unremarkable. Moderately prominent mediastinal  lymph nodes with pretracheal nodes measuring up to about 1.8 cm short axis dimension. Esophagus is decompressed. Lungs/Pleura: Small right pleural effusion. Significant volume loss in the right lung with heterogeneous consolidation and air bronchograms throughout the right lung but most prominent in the right middle and right lower lungs. Minimal residual aeration in the right upper lung. There is a cut off sign to the right bronchus intermedius which may indicate obstructing mass or endobronchial process. The left lung demonstrates diffuse emphysematous changes with peripheral fibrosis and multiple pulmonary nodules which have developed since the prior study. Overall, changes are likely to represent a right hilar or endobronchial mass causing postobstructive change on the right and metastatic disease throughout both lungs. Upper Abdomen: Prominent vascular calcifications. No acute process identified in the visualized upper abdomen. Musculoskeletal: Degenerative changes in the spine. No destructive bone lesions. IMPRESSION: 1. Collapse and consolidation of the right lung with suspicion of obstructing lesion involving the right bronchus intermedius. This is likely to represent primary lung cancer. 2. Diffuse pulmonary nodule seen best in the aerated left lung, likely metastatic disease. 3. Mediastinal lymphadenopathy is likely metastatic. 4. Underlying emphysematous and chronic bronchitic changes in the lungs with slight peripheral fibrosis. 5. 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 6. Diffuse aortic atherosclerosis. Electronically Signed   By: Lucienne Capers M.D.   On: 07/09/2021 01:23   MR BRAIN W WO CONTRAST  Result Date: 07/10/2021 CLINICAL DATA:  Hematologic malignancy, staging. EXAM: MRI HEAD WITHOUT AND WITH CONTRAST TECHNIQUE: Multiplanar, multiecho pulse sequences of the brain and surrounding structures were obtained without and with intravenous contrast. CONTRAST:  34m  GADAVIST GADOBUTROL 1 MMOL/ML IV SOLN COMPARISON:  Head CT 05/28/2014 FINDINGS: Brain: No enhancement or swelling to suggest metastatic disease. No acute infarction, hemorrhage, hydrocephalus, extra-axial collection or mass lesion. Chronic small vessel ischemia that is extensive in the hemispheric white matter and mild in the pons. Small remote bilateral cerebellar infarcts. Vascular: Normal flow voids. Skull and upper cervical spine: Normal marrow signal. Sinuses/Orbits: Negative. IMPRESSION: 1. No acute finding or evidence of metastatic disease. 2. Extensive chronic small vessel ischemia. Electronically Signed   By: JJorje GuildM.D.   On: 07/10/2021 11:39   DG Chest Portable 1 View  Result Date: 07/08/2021 CLINICAL DATA:  feeling short of breath today Best films possible due to pt condition. CHf/COPd exacerbation.e val pulm congestion/infiltrate EXAM: PORTABLE CHEST 1 VIEW COMPARISON:  Chest x-ray 06/26/2020 FINDINGS: The heart and mediastinal contours are grossly unchanged. Aortic calcification. Right lung pulmonary staples noted. Almost complete opacification of the right  hemithorax. Increased interstitial markings bilaterally. Likely some component of right pleural effusion. No left pleural effusion. No pneumothorax. No acute osseous abnormality. IMPRESSION: Almost complete opacification of the right hemithorax as well as increased interstitial markings bilaterally. Likely some component of right pleural effusion. Findings suggestive of infection/inflammation. Underlying malignancy not excluded. Recommend CT chest with intravenous contrast for further evaluation. Electronically Signed   By: Iven Finn M.D.   On: 07/08/2021 22:48   ECHOCARDIOGRAM COMPLETE  Result Date: 07/09/2021    ECHOCARDIOGRAM REPORT   Patient Name:   KENSLEY VALLADARES Date of Exam: 07/09/2021 Medical Rec #:  [id number]    Height:       61.0 in Accession #:    [id number]   Weight:       116.0 lb Date of Birth:  09-04-1941    BSA:           1.498 m Patient Age:    1 years     BP:           114/58 mmHg Patient Gender: F            HR:           116 bpm. Exam Location:  [facility] Procedure: 2D Echo, Cardiac Doppler and Color Doppler Indications:     CHF-Acute Diastolic E99.37  History:         Patient has prior history of Echocardiogram examinations. CHF,                  CAD; Risk Factors:Hypertension.  Sonographer:     Alyse Low Roar Referring Phys:  [id number] Athena Masse Diagnosing Phys: Isaias Cowman MD IMPRESSIONS  1. Left ventricular ejection fraction, by estimation, is 55 to 60%. The left ventricle has normal function. The left ventricle has no regional wall motion abnormalities. Left ventricular diastolic parameters are indeterminate.  2. Right ventricular systolic function is normal. The right ventricular size is normal.  3. The mitral valve is normal in structure. Mild mitral valve regurgitation. No evidence of mitral stenosis.  4. Tricuspid valve regurgitation is moderate.  5. The aortic valve is normal in structure. Aortic valve regurgitation is not visualized. No aortic stenosis is present.  6. The inferior vena cava is normal in size with greater than 50% respiratory variability, suggesting right atrial pressure of 3 mmHg. FINDINGS  Left Ventricle: Left ventricular ejection fraction, by estimation, is 55 to 60%. The left ventricle has normal function. The left ventricle has no regional wall motion abnormalities. The left ventricular internal cavity size was normal in size. There is  no left ventricular hypertrophy. Left ventricular diastolic parameters are indeterminate. Right Ventricle: The right ventricular size is normal. No increase in right ventricular wall thickness. Right ventricular systolic function is normal. Left Atrium: Left atrial size was normal in size. Right Atrium: Right atrial size was normal in size. Pericardium: There is no evidence of pericardial effusion. Mitral Valve: The mitral valve is normal in structure.  Mild mitral valve regurgitation. No evidence of mitral valve stenosis. Tricuspid Valve: The tricuspid valve is normal in structure. Tricuspid valve regurgitation is moderate . No evidence of tricuspid stenosis. Aortic Valve: The aortic valve is normal in structure. Aortic valve regurgitation is not visualized. No aortic stenosis is present. Aortic valve peak gradient measures 7.1 mmHg. Pulmonic Valve: The pulmonic valve was normal in structure. Pulmonic valve regurgitation is not visualized. No evidence of pulmonic stenosis. Aorta: The aortic root is normal in size and structure. Venous: The inferior vena  cava is normal in size with greater than 50% respiratory variability, suggesting right atrial pressure of 3 mmHg. IAS/Shunts: No atrial level shunt detected by color flow Doppler.  LEFT VENTRICLE PLAX 2D LVIDd:         4.10 cm  Diastology LVIDs:         2.90 cm  LV e' medial:    9.03 cm/s LV PW:         1.10 cm  LV E/e' medial:  17.7 LV IVS:        1.20 cm  LV e' lateral:   6.31 cm/s LVOT diam:     1.70 cm  LV E/e' lateral: 25.4 LVOT Area:     2.27 cm  RIGHT VENTRICLE RV Basal diam:  2.40 cm RV Mid diam:    2.10 cm RV S prime:     12.20 cm/s TAPSE (M-mode): 1.5 cm LEFT ATRIUM             Index       RIGHT ATRIUM           Index LA diam:        3.70 cm 2.47 cm/m  RA Area:     14.30 cm LA Vol (A2C):   41.2 ml 27.50 ml/m RA Volume:   32.30 ml  21.56 ml/m LA Vol (A4C):   31.9 ml 21.29 ml/m LA Biplane Vol: 36.7 ml 24.49 ml/m  AORTIC VALVE                PULMONIC VALVE AV Area (Vmax): 1.93 cm    PV Vmax:        1.22 m/s AV Vmax:        133.00 cm/s PV Peak grad:   6.0 mmHg AV Peak Grad:   7.1 mmHg    RVOT Peak grad: 2 mmHg LVOT Vmax:      113.00 cm/s  AORTA Ao Root diam: 2.70 cm MITRAL VALVE                TRICUSPID VALVE MV Area (PHT): 6.83 cm     TR Peak grad:   35.0 mmHg MV Decel Time: 111 msec     TR Vmax:        296.00 cm/s MV E velocity: 160.00 cm/s                             SHUNTS                              Systemic Diam: 1.70 cm Isaias Cowman MD Electronically signed by Isaias Cowman MD Signature Date/Time: 07/09/2021/1:24:30 PM    Final       ASSESSMENT/PLAN   This is a pleasant 80 yr old lady here in respiratory failure, known copd, acute on chronic diastolic heart failure, echo suggestive of secondary pulmonary hypertension and afib. Cardiology has seen, recommendations noted. More alert today   Acute hypoxemic respiratory failure - present on admission  - COVID19 negative  - supplemental O2 during my evaluation 6L/min - will perform infectious workup for pneumonia -serum fungitell -legionella ab -strep pneumoniae ur AG -Histoplasma Ur Ag -sputum resp cultures -AFB sputum expectorated specimen -sputum cytology  -reviewed pertinent imaging with patient today - ESR -PT/OT for d/c planning  -please encourage patient to use incentive spirometer few times each hour while hospitalized.     Right endobronchial mass of  bronchus intermedius - patient higher risk at this time would favor completion of full scope of therapy with CAP regimen then can set up bronchosocopy   Thank you for allowing me to participate in the care of this patient.   Patient/Family are satisfied with care plan and all questions have been answered.  This document was prepared using Dragon voice recognition software and may include unintentional dictation errors.    Ottie Glazier, M.D.  Pulmonary & [place]

## 2021-07-14 LAB — CULTURE, RESPIRATORY W GRAM STAIN: Culture: NORMAL

## 2021-07-14 LAB — LEGIONELLA PNEUMOPHILA TOTAL AB: Legionella Pneumo Total Ab: <0.91 OD ratio (ref 0.00–0.90)

## 2021-07-14 LAB — ASPERGILLUS ANTIGEN, BAL/SERUM: Aspergillus Ag, BAL/Serum: 0.03 Index (ref 0.00–0.49)

## 2021-07-14 LAB — FUNGITELL, SERUM: Fungitell Result: <31 pg/mL (ref <80)

## 2021-07-15 ENCOUNTER — Inpatient Hospital Stay: Payer: Medicare Other

## 2021-07-15 ENCOUNTER — Ambulatory Visit: Payer: Medicare Other

## 2021-07-15 ENCOUNTER — Encounter: Payer: Self-pay | Admitting: *Deleted

## 2021-07-15 NOTE — Progress Notes (Signed)
Pt made aware of upcoming appts scheduled for PET scan on 9/29 at 1230p and hospital follow up appt with Dr. Janese Banks on 10/5 at 8:45am. Pt verbalized understanding. Informed pt that will notify [facility] to help arrange transport.   Message left with transportation at [facility] to return call to notify of upcoming appts.

## 2021-07-16 NOTE — Progress Notes (Deleted)
Patient ID: Felicia Sellers, female    DOB: 1941-05-26, 80 y.o.   MRN: [id number]   Felicia Sellers is a 80 y/o female with a history of lung cancer, CAD, HTN, COPD, recent tobacco use, current alcohol use and chronic heart failure.  Echo report from 07/09/21 reviewed and showed an EF of 55-60% along with mild MR but without LVH/LAE. Echo report from 06/28/20 reviewed and showed an EF of 70-75% along with moderate LVH, moderately elevated PA pressure, moderate MR and mild/moderate LAE.   Admitted 07/08/21 due to shortness of breath and cough. Found to be hypoxic with sat of 87%. Cardiology and pulmonology consults obtained. Placed on oxygen and given nebulizer treatment. Placed on antibiotics for possible pneumonia. Discharged after 5 days.  She presents today for her follow up visit with a chief complaint of                         Past Medical History:  Diagnosis Date   Cancer ([place])    right lung removed - lung cancer 1/3 lung removed    CHF (congestive heart failure) (HCC)    COPD (chronic obstructive pulmonary disease) ([place])    Coronary artery disease    History of stomach ulcers    Hypertension    Pneumonia    Past Surgical History:  Procedure Laterality Date   LUNG REMOVAL, PARTIAL Left    Family History  Problem Relation Age of Onset   Breast cancer Neg Hx    Social History   Tobacco Use   Smoking status: Every Day    Packs/day: 1.00    Years: 66.00    Pack years: 66.00    Types: Cigarettes    Last attempt to quit: 06/26/2020    Years since quitting: 1.0   Smokeless tobacco: Never  Substance Use Topics   Alcohol use: Not Currently    Comment: use to drink alot ( vodka) 6 months ago   Allergies  Allergen Reactions   Aspirin Other (See Comments)    High risk of bleeding     Review of Systems  Constitutional:  Positive for fatigue (minimal). Negative for appetite change.  HENT:  Negative for congestion, postnasal drip and sore throat.   Eyes: Negative.   Respiratory:   Negative for cough and shortness of breath.   Cardiovascular:  Negative for chest pain, palpitations and leg swelling.  Gastrointestinal:  Negative for abdominal distention and abdominal pain.  Endocrine: Negative.   Genitourinary: Negative.   Musculoskeletal:  Positive for back pain. Negative for neck pain.  Skin: Negative.   Allergic/Immunologic: Negative.   Neurological:  Negative for dizziness and light-headedness.  Hematological:  Negative for adenopathy. Does not bruise/bleed easily.  Psychiatric/Behavioral:  Negative for dysphoric mood and sleep disturbance (sleeping on 2 pillows). The patient is not nervous/anxious.      Physical Exam Vitals and nursing note reviewed.  Constitutional:      Appearance: Normal appearance.  HENT:     Head: Normocephalic and atraumatic.  Cardiovascular:     Rate and Rhythm: Regular rhythm. Bradycardia present.  Pulmonary:     Effort: Pulmonary effort is normal. No respiratory distress.     Breath sounds: No wheezing or rales.  Abdominal:     Palpations: Abdomen is soft.     Tenderness: There is no abdominal tenderness.  Musculoskeletal:        General: No tenderness.     Cervical back: Normal range of motion  and neck supple.     Right lower leg: No edema.     Left lower leg: No edema.  Skin:    General: Skin is warm and dry.  Neurological:     General: No focal deficit present.     Mental Status: She is alert and oriented to person, place, and time.  Psychiatric:        Mood and Affect: Mood normal.        Behavior: Behavior normal.        Thought Content: Thought content normal.   Assessment & Plan:  1: Chronic heart failure with preserved ejection fraction along without structural changes (LVH)- - NYHA class II - euvolemic today - Patient reports she is trying to remember to weigh daily - weight 106 pounds from last visit here Nov 2021 - Says she has cut back on her salt but still adds salt to some things. - Reviewed dash diet  with patient and she says she is going her best - Patient reports her nephew often cooks and he is currently living with her.  - BNP 07/08/21 was 288.7   2: HTN- - BP  - saw PCP Felicia Sellers) 03/22/21 - BMP 07/13/21 reviewed and showed sodium 129, potassium 3.1, creatinine 0.73 and GFR >60  3: COPD/ tobacco use- - wearing oxygen at 2L around the clock - quit smoking 06/26/20  4: Alcohol use- - Patient reports she has not had a beer since September - Reports that since she stopped drinking alcohol, she is having trouble sleeping and is having trouble staying asleep.   Patient did not bring her medications nor a list. Each medication was verbally reviewed with the patient and she was encouraged to bring the bottles to every visit to confirm accuracy of list.

## 2021-07-18 ENCOUNTER — Ambulatory Visit: Payer: Medicare Other | Admitting: Family

## 2021-07-18 NOTE — Progress Notes (Signed)
Appts faxed to [facility] at [facility] to schedule transport and give to staff to review instructions prior to PET scan. Fax # [phone].

## 2021-07-20 ENCOUNTER — Encounter: Payer: Self-pay | Admitting: Family

## 2021-07-20 ENCOUNTER — Ambulatory Visit (HOSPITAL_BASED_OUTPATIENT_CLINIC_OR_DEPARTMENT_OTHER): Payer: Medicare Other | Admitting: Family

## 2021-07-20 ENCOUNTER — Ambulatory Visit: Payer: Medicare Other

## 2021-07-20 ENCOUNTER — Other Ambulatory Visit: Payer: Self-pay

## 2021-07-20 VITALS — BP 119/68 | HR 98 | Resp 16 | Ht 63.0 in | Wt 108.0 lb

## 2021-07-20 DIAGNOSIS — G8929 Other chronic pain: Secondary | ICD-10-CM | POA: Diagnosis not present

## 2021-07-20 DIAGNOSIS — Z79899 Other long term (current) drug therapy: Secondary | ICD-10-CM | POA: Diagnosis not present

## 2021-07-20 DIAGNOSIS — Z09 Encounter for follow-up examination after completed treatment for conditions other than malignant neoplasm: Secondary | ICD-10-CM | POA: Diagnosis not present

## 2021-07-20 DIAGNOSIS — I2510 Atherosclerotic heart disease of native coronary artery without angina pectoris: Secondary | ICD-10-CM | POA: Diagnosis not present

## 2021-07-20 DIAGNOSIS — R5383 Other fatigue: Secondary | ICD-10-CM | POA: Insufficient documentation

## 2021-07-20 DIAGNOSIS — R0602 Shortness of breath: Secondary | ICD-10-CM | POA: Insufficient documentation

## 2021-07-20 DIAGNOSIS — R059 Cough, unspecified: Secondary | ICD-10-CM | POA: Insufficient documentation

## 2021-07-20 DIAGNOSIS — I110 Hypertensive heart disease with heart failure: Secondary | ICD-10-CM | POA: Diagnosis not present

## 2021-07-20 DIAGNOSIS — I5032 Chronic diastolic (congestive) heart failure: Secondary | ICD-10-CM | POA: Diagnosis not present

## 2021-07-20 DIAGNOSIS — I10 Essential (primary) hypertension: Secondary | ICD-10-CM

## 2021-07-20 DIAGNOSIS — Z9981 Dependence on supplemental oxygen: Secondary | ICD-10-CM | POA: Diagnosis not present

## 2021-07-20 DIAGNOSIS — Z87891 Personal history of nicotine dependence: Secondary | ICD-10-CM | POA: Diagnosis not present

## 2021-07-20 DIAGNOSIS — J449 Chronic obstructive pulmonary disease, unspecified: Secondary | ICD-10-CM | POA: Diagnosis not present

## 2021-07-20 DIAGNOSIS — Z7901 Long term (current) use of anticoagulants: Secondary | ICD-10-CM | POA: Insufficient documentation

## 2021-07-20 DIAGNOSIS — R238 Other skin changes: Secondary | ICD-10-CM | POA: Insufficient documentation

## 2021-07-20 DIAGNOSIS — R918 Other nonspecific abnormal finding of lung field: Secondary | ICD-10-CM | POA: Diagnosis not present

## 2021-07-20 LAB — BASIC METABOLIC PANEL
Anion gap: 9 (ref 5–15)
BUN: 17 mg/dL (ref 8–23)
CO2: 30 mmol/L (ref 22–32)
Calcium: 9.1 mg/dL (ref 8.9–10.3)
Chloride: 94 mmol/L — ABNORMAL LOW (ref 98–111)
Creatinine, Ser: 0.76 mg/dL (ref 0.44–1.00)
GFR, Estimated: >60 mL/min (ref >60)
Glucose, Bld: 142 mg/dL — ABNORMAL HIGH (ref 70–99)
Potassium: 3.8 mmol/L (ref 3.5–5.1)
Sodium: 133 mmol/L — ABNORMAL LOW (ref 135–145)

## 2021-07-20 NOTE — Patient Instructions (Signed)
Continue weighing daily and call for an overnight weight gain of > 2 pounds or a weekly weight gain of >5 pounds. 

## 2021-07-20 NOTE — Progress Notes (Signed)
Patient ID: Felicia Sellers, female    DOB: Oct 16, 1941, 80 y.o.   MRN: [id number]   [name] is a 80 y/o female with a history of lung cancer, CAD, HTN, COPD, recent tobacco use, current alcohol use and chronic heart failure.  Echo report from 07/09/21 reviewed and showed an EF of 55-60% along with mild MR but without LVH/LAE. Echo report from 06/28/20 reviewed and showed an EF of 70-75% along with moderate LVH, moderately elevated PA pressure, moderate MR and mild/moderate LAE.   Admitted 07/08/21 due to shortness of breath and cough. Found to be hypoxic with sat of 87%. Cardiology and pulmonology consults obtained. Placed on oxygen and given nebulizer treatment. Placed on antibiotics for possible pneumonia. Discharged after 5 days.  She presents today for her follow up visit with a chief complaint of minimal shortness of breath upon moderate exertion. She describes this as chronic in nature having been present for several years. She has associated fatigue, cough, chronic pain & easy bruising along with this. She denies any difficulty sleeping, dizziness, abdominal distention, palpitations, pedal edema, chest pain or weight gain.                        Past Medical History:  Diagnosis Date   Cancer ([place])    right lung removed - lung cancer 1/3 lung removed    CHF (congestive heart failure) (HCC)    COPD (chronic obstructive pulmonary disease) (HCC)    Coronary artery disease    History of stomach ulcers    Hypertension    Pneumonia    Past Surgical History:  Procedure Laterality Date   LUNG REMOVAL, PARTIAL Left    Family History  Problem Relation Age of Onset   Breast cancer Neg Hx    Social History   Tobacco Use   Smoking status: Every Day    Packs/day: 1.00    Years: 66.00    Pack years: 66.00    Types: Cigarettes    Last attempt to quit: 06/26/2020    Years since quitting: 1.0   Smokeless tobacco: Never  Substance Use Topics   Alcohol use: Not Currently    Comment: use to  drink alot ( vodka) 6 months ago   Allergies  Allergen Reactions   Aspirin Other (See Comments)    High risk of bleeding   Prior to Admission medications   Medication Sig Start Date End Date Taking? Authorizing Provider  Albuterol Sulfate (PROAIR RESPICLICK) 283 (90 Base) MCG/ACT AEPB Inhale 2 puffs into the lungs every 6 (six) hours as needed. 06/28/20  Yes Danford, Suann Larry, MD  amLODipine (NORVASC) 10 MG tablet Take 10 mg by mouth daily.   Yes [provider]  atorvastatin (LIPITOR) 40 MG tablet Take 40 mg by mouth daily.   Yes [provider]  calcium carbonate (OS-CAL - DOSED IN MG OF ELEMENTAL CALCIUM) 1250 (500 Ca) MG tablet Take 1 tablet by mouth 2 (two) times daily with a meal.    Yes [provider]  Cholecalciferol 1000 units capsule Take 1,000 Units by mouth daily.   Yes [provider]  docusate sodium (COLACE) 100 MG capsule Take 100 mg by mouth daily.    Yes [provider]  gabapentin (NEURONTIN) 300 MG capsule Take 300 mg by mouth 3 (three) times daily.   Yes [provider]  ipratropium (ATROVENT) 0.03 % nasal spray Place 2 sprays into the nose 2 (two) times daily as  needed. 03/22/21 03/22/22 Yes [provider]  lisinopril (ZESTRIL) 10 MG tablet Take 10 mg by mouth daily.    Yes [provider]  loperamide (IMODIUM) 2 MG capsule Take 4 mg by mouth as needed for diarrhea or loose stools.   Yes [provider]  predniSONE (DELTASONE) 20 MG tablet Take 1 tablet (20 mg total) by mouth daily with breakfast for 7 days. 07/14/21 07/21/21 Yes Sreenath, Sudheer B, MD  metoprolol succinate (TOPROL-XL) 25 MG 24 hr tablet Take 25 mg by mouth daily. Patient not taking: Reported on 07/20/2021    [provider]    Review of Systems  Constitutional:  Positive for fatigue (minimal). Negative for appetite change.  HENT:  Negative for congestion, postnasal drip and sore throat.   Eyes: Negative.    Respiratory:  Positive for cough and shortness of breath.   Cardiovascular:  Negative for chest pain, palpitations and leg swelling.  Gastrointestinal:  Negative for abdominal distention and abdominal pain.  Endocrine: Negative.   Genitourinary: Negative.   Musculoskeletal:  Positive for back pain. Negative for neck pain.  Skin: Negative.   Allergic/Immunologic: Negative.   Neurological:  Negative for dizziness and light-headedness.  Hematological:  Negative for adenopathy. Bruises/bleeds easily.  Psychiatric/Behavioral:  Negative for dysphoric mood and sleep disturbance (sleeping on 2 pillows). The patient is not nervous/anxious.    Vitals:   07/20/21 1401  BP: 119/68  Pulse: 98  Resp: 16  SpO2: 100%  Weight: 108 lb (49 kg)  Height: 5\' 3"  (1.6 m)   Wt Readings from Last 3 Encounters:  07/20/21 108 lb (49 kg)  07/11/21 99 lb 6.8 oz (45.1 kg)  09/08/20 106 lb (48.1 kg)   Lab Results  Component Value Date   CREATININE 0.73 07/13/2021   CREATININE 0.55 07/12/2021   CREATININE 0.64 07/11/2021   Physical Exam Vitals and nursing note reviewed.  Constitutional:      Appearance: Normal appearance.  HENT:     Head: Normocephalic and atraumatic.  Cardiovascular:     Rate and Rhythm: Normal rate and regular rhythm.  Pulmonary:     Effort: Pulmonary effort is normal. No respiratory distress.     Breath sounds: No wheezing or rales.  Abdominal:     Palpations: Abdomen is soft.     Tenderness: There is no abdominal tenderness.  Musculoskeletal:        General: No tenderness.     Cervical back: Normal range of motion and neck supple.     Right lower leg: No edema.     Left lower leg: No edema.  Skin:    General: Skin is warm and dry.  Neurological:     General: No focal deficit present.     Mental Status: She is alert and oriented to person, place, and time.  Psychiatric:        Mood and Affect: Mood normal.        Behavior: Behavior normal.        Thought Content:  Thought content normal.   Assessment & Plan:  1: Chronic heart failure with preserved ejection fraction along without structural changes- - NYHA class II - euvolemic today - being weighed daily at [facility]; reminded to call for an overnight weight gain of > 2 pounds or a weekly weight gain of > 5 pounds - weight up 2 pounds from last visit here Nov 2021 - Says she has cut back on her salt but still adds salt to some things -  BNP 07/08/21 was 288.7  2: HTN- - BP looks good today (119/68) - saw PCP Vevelyn Royals) 03/22/21 but now seeing PCP at [facility] - BMP 07/13/21 reviewed and showed sodium 129, potassium 3.1, creatinine 0.73 and GFR >60 - will check BMP today to evaluate potassium/ sodium level  3: COPD/ tobacco use- - wearing oxygen at 2L around the clock - quit smoking 06/26/20   Facility medication list reviewed.   Return in 4 months or sooner for any questions/problems before then.

## 2021-07-21 ENCOUNTER — Other Ambulatory Visit: Payer: Self-pay

## 2021-07-21 ENCOUNTER — Ambulatory Visit
Admission: RE | Admit: 2021-07-21 | Discharge: 2021-07-21 | Disposition: A | Discharge status: Home / Self Care | Payer: Medicare Other | Source: Ambulatory Visit | Attending: Oncology | Admitting: Oncology

## 2021-07-21 DIAGNOSIS — R918 Other nonspecific abnormal finding of lung field: Secondary | ICD-10-CM

## 2021-07-25 ENCOUNTER — Ambulatory Visit
Admission: RE | Admit: 2021-07-25 | Discharge: 2021-07-25 | Disposition: A | Discharge status: Home / Self Care | Payer: No Typology Code available for payment source | Source: Ambulatory Visit | Attending: Oncology | Admitting: Oncology

## 2021-07-25 ENCOUNTER — Other Ambulatory Visit: Payer: Self-pay

## 2021-07-25 ENCOUNTER — Non-Acute Institutional Stay: Payer: Medicare Other | Admitting: Nurse Practitioner

## 2021-07-25 ENCOUNTER — Encounter: Payer: Self-pay | Admitting: Nurse Practitioner

## 2021-07-25 VITALS — BP 122/66 | HR 82 | Temp 98.0°F | Resp 18 | Wt 118.0 lb

## 2021-07-25 DIAGNOSIS — I7123 Aneurysm of the descending thoracic aorta, without rupture: Secondary | ICD-10-CM | POA: Diagnosis not present

## 2021-07-25 DIAGNOSIS — R599 Enlarged lymph nodes, unspecified: Secondary | ICD-10-CM | POA: Diagnosis not present

## 2021-07-25 DIAGNOSIS — I700 Atherosclerosis of aorta: Secondary | ICD-10-CM | POA: Diagnosis not present

## 2021-07-25 DIAGNOSIS — I3481 Nonrheumatic mitral (valve) annulus calcification: Secondary | ICD-10-CM | POA: Insufficient documentation

## 2021-07-25 DIAGNOSIS — J90 Pleural effusion, not elsewhere classified: Secondary | ICD-10-CM | POA: Insufficient documentation

## 2021-07-25 DIAGNOSIS — J811 Chronic pulmonary edema: Secondary | ICD-10-CM | POA: Insufficient documentation

## 2021-07-25 DIAGNOSIS — I2510 Atherosclerotic heart disease of native coronary artery without angina pectoris: Secondary | ICD-10-CM | POA: Diagnosis not present

## 2021-07-25 DIAGNOSIS — R918 Other nonspecific abnormal finding of lung field: Secondary | ICD-10-CM | POA: Diagnosis present

## 2021-07-25 DIAGNOSIS — R531 Weakness: Secondary | ICD-10-CM

## 2021-07-25 DIAGNOSIS — Z515 Encounter for palliative care: Secondary | ICD-10-CM

## 2021-07-25 DIAGNOSIS — J432 Centrilobular emphysema: Secondary | ICD-10-CM | POA: Insufficient documentation

## 2021-07-25 DIAGNOSIS — R0602 Shortness of breath: Secondary | ICD-10-CM

## 2021-07-25 DIAGNOSIS — I7122 Aneurysm of the aortic arch, without rupture: Secondary | ICD-10-CM | POA: Insufficient documentation

## 2021-07-25 DIAGNOSIS — K5730 Diverticulosis of large intestine without perforation or abscess without bleeding: Secondary | ICD-10-CM | POA: Diagnosis not present

## 2021-07-25 LAB — GLUCOSE, CAPILLARY: Glucose-Capillary: 78 mg/dL (ref 70–99)

## 2021-07-25 MED ORDER — FLUDEOXYGLUCOSE F - 18 (FDG) INJECTION
5.6000 | Freq: Once | INTRAVENOUS | Status: AC | PRN
  Administered 2021-07-25: 6.1 via INTRAVENOUS

## 2021-07-25 NOTE — Progress Notes (Signed)
[facility] Consult Note Telephone: [phone]  Fax: [phone]   Date of encounter: 07/25/21 10:20 PM PATIENT NAME: Felicia Sellers [address] [place] [address]   [phone] (home)  DOB: 11/02/1940 MRN: [id number] PRIMARY CARE PROVIDER:   Dr [facility] [place], [facility],  [place] [address] [phone] RESPONSIBLE PARTY:    Contact Information     Name Relation Home Work Mobile   Dietrich Pates   [phone]      I met face to face with patient in facility. Palliative Care was asked to follow this patient by consultation request of  Dr Nicole Kindred to address advance care planning and complex medical decision making. This is the initial visit.                          ASSESSMENT AND PLAN / RECOMMENDATIONS:  Symptom Management/Plan: 1. Advance Care Planning; Full code  2. Shortness of breath secondary to COPD/CHF with possible malignancy; discussed continue monitoring respiratory, inhalation therapy, O2 supplemental; continue further workup at [facility]  3. Generalized weakness secondary to deconditioning; continue with therapy for strengthening, mobility, continue with therapy as able as hope is to d/c home with her nephew and dog  4. Goals of Care: Goals include to maximize quality of life and symptom management. Our advance care planning conversation included a discussion about:    The value and importance of advance care planning  Exploration of personal, cultural or spiritual beliefs that might influence medical decisions  Exploration of goals of care in the event of a sudden injury or illness  Identification and preparation of a healthcare agent  Review and updating or creation of an advance directive document.  5. Palliative care encounter; Palliative care encounter; Palliative medicine team will continue to support patient, patient's family, and  medical team. Visit consisted of counseling and education dealing with the complex and emotionally intense issues of symptom management and palliative care in the setting of serious and potentially life-threatening illness  6. f/u 2 weeks for ongoing monitoring chronic disease progression, ongoing discussions complex medical decision making  Follow up Palliative Care Visit: Palliative care will continue to follow for complex medical decision making, advance care planning, and clarification of goals. Return 2 weeks or prn.  I spent 48 minutes providing this consultation. More than 50% of the time in this consultation was spent in counseling and care coordination. PPS: 40%  Chief Complaint: Initial palliative consult for complex medical decision making  HISTORY OF PRESENT ILLNESS:  Felicia Sellers is a 80 y.o. year old female  with multiple medical problems including  Right lung cancer status post lobectomy, HTN, COPD, CHF, CAD, afib, chronic hyponatremia, alcohol abuse, dementia. Hospitalization 07/08/2021 to 07/13/2021 secondary to shortness of breath; generalized weakness. Clinical evidence of postobstructive pneumonia including shortness of breath, myalgias, leukocytosis.  CT chest with contrast demonstrates collapse and consolidation of right lung.  Suspicion for obstructive lesion.  Initial plan was for inpatient bronchoscopy however decision was made to defer bronchoscopy to outpatient after completion of antibiotic course. CHF diastolic requiring diuresis. Paroxysmal afib possible secondary to pulmonary process. Felicia Sellers was d/c to SNF at [facility]. Felicia Sellers does require assistance for transfers, mobility, adl's including dressing, bathing as she becomes short of breath with exertion, fatigue easily with generalized weakness. Staff endorses appetite has been declined, continue supplements. Staff endorses pending diagnostic  testing for further workup. Felicia Sellers is a full code. At  present, Felicia Sellers is lying in bed, doing a word search. Felicia Sellers and I talked about purpose of pc consult, Felicia Sellers in agreement. Felicia Sellers and I talked about her living with her nephew and dog at home. Felicia Sellers endorses she is very anxious to get home. Felicia Sellers talked about past medical history, chronic disease progression, life review. We talked about quality of life. We talked about how much she misses her dog. We talked about symptoms of shortness of breath, weakness, fatigue with exertion. We talked about her appetite, nutrition, supplements. We talked about the work she has been doing with therapy. We talked about recent appointments at the cancer center for further testing and MD appointments she has been late too. We talked about transportation through [facility]. We talked about medical goals. We talked about role pc in poc. We talked about f/u pc visit in 2 weeks, Felicia Sellers in agreement. I updated staff.   History obtained from review of EMR, discussion with facility staff and Felicia Sellers.  I reviewed available labs, medications, imaging, studies and related documents from the EMR.  Records reviewed and summarized above.   ROS Full 14 system review of systems performed and negative with exception of: as per HPI.   Physical Exam: Constitutional: NAD General: frail appearing, thin/, chronically ill, pleasant female EYES: lids intact ENMT: oral mucous membranes moist CV: S1S2, RRR Pulmonary: decrease throughout; no increased work of breathing, + cough Abdomen: normo-active BS + 4 quadrants, soft and non tender MSK: transfers with assistance; stands-steps with assistance Skin: warm and dry Neuro:  + generalized weakness,  no cognitive impairment Psych: non-anxious affect, A and O x 3  CURRENT PROBLEM LIST:  Patient Active Problem List   Diagnosis Date Noted   Acute respiratory failure with hypoxia ([place]) 07/09/2021   CAP (community acquired pneumonia) 07/09/2021   Obstructive  pneumonia 07/09/2021   Coronary artery disease involving native coronary artery of native heart without angina pectoris 07/19/2020   COPD, moderate ([place]) 07/19/2020   Personal history of lung cancer 07/19/2020   Community acquired pneumonia 06/27/2020   Acute CHF (congestive heart failure) ([place]) 06/26/2020   Leukocytosis 06/26/2020   Hypoxia 06/26/2020   Thoracic aortic aneurysm without rupture 08/16/2018   Kyphosis 08/07/2018   Hypertension 08/07/2018   Tobacco use 08/07/2018   Age-related osteoporosis without current pathological fracture 12/21/2016   Chronic hyponatremia 11/04/2015   Chronic low back pain 06/18/2015   Cardiomyopathy ([place]) 08/13/2014   Left bundle branch block (LBBB) 07/23/2014   Pedal edema 07/23/2014   SOB (shortness of breath) on exertion 07/23/2014   PAST MEDICAL HISTORY:  Active Ambulatory Problems    Diagnosis Date Noted   Kyphosis 08/07/2018   Age-related osteoporosis without current pathological fracture 12/21/2016   Cardiomyopathy ([place]) 08/13/2014   Chronic hyponatremia 11/04/2015   Chronic low back pain 06/18/2015   Hypertension 08/07/2018   Left bundle branch block (LBBB) 07/23/2014   Pedal edema 07/23/2014   SOB (shortness of breath) on exertion 07/23/2014   Tobacco use 08/07/2018   Thoracic aortic aneurysm without rupture 08/16/2018   Acute CHF (congestive heart failure) ([place]) 06/26/2020   Leukocytosis 06/26/2020   Hypoxia 06/26/2020   Community acquired pneumonia 06/27/2020   Coronary artery disease involving native coronary artery of native heart without angina pectoris 07/19/2020   COPD, moderate ([place]) 07/19/2020   Personal history of lung cancer 07/19/2020   Acute respiratory failure with  hypoxia ([place]) 07/09/2021   CAP (community acquired pneumonia) 07/09/2021   Obstructive pneumonia 07/09/2021   Resolved Ambulatory Problems    Diagnosis Date Noted   Cancer ([place]) 08/07/2018   Past Medical History:  Diagnosis Date   CHF (congestive  heart failure) (HCC)    COPD (chronic obstructive pulmonary disease) (HCC)    Coronary artery disease    History of stomach ulcers    Pneumonia    SOCIAL HX:  Social History   Tobacco Use   Smoking status: Former    Packs/day: 1.00    Years: 66.00    Pack years: 66.00    Types: Cigarettes    Quit date: 06/26/2020    Years since quitting: 1.0   Smokeless tobacco: Never  Substance Use Topics   Alcohol use: Not Currently    Comment: use to drink alot ( vodka) 6 months ago   FAMILY HX:  Family History  Problem Relation Age of Onset   Breast cancer Neg Hx     reviewed  ALLERGIES:  Allergies  Allergen Reactions   Aspirin Other (See Comments)    High risk of bleeding     PERTINENT MEDICATIONS:  Outpatient Encounter Medications as of 07/25/2021  Medication Sig   Albuterol Sulfate (PROAIR RESPICLICK) 509 (90 Base) MCG/ACT AEPB Inhale 2 puffs into the lungs every 6 (six) hours as needed.   amLODipine (NORVASC) 10 MG tablet Take 10 mg by mouth daily.   atorvastatin (LIPITOR) 40 MG tablet Take 40 mg by mouth daily.   calcium carbonate (OS-CAL - DOSED IN MG OF ELEMENTAL CALCIUM) 1250 (500 Ca) MG tablet Take 1 tablet by mouth 2 (two) times daily with a meal.    Cholecalciferol 1000 units capsule Take 1,000 Units by mouth daily.   docusate sodium (COLACE) 100 MG capsule Take 100 mg by mouth daily.    gabapentin (NEURONTIN) 300 MG capsule Take 300 mg by mouth 3 (three) times daily.   ipratropium (ATROVENT) 0.03 % nasal spray Place 2 sprays into the nose 2 (two) times daily as needed.   lisinopril (ZESTRIL) 10 MG tablet Take 10 mg by mouth daily.    loperamide (IMODIUM) 2 MG capsule Take 4 mg by mouth as needed for diarrhea or loose stools.   metoprolol succinate (TOPROL-XL) 25 MG 24 hr tablet Take 25 mg by mouth daily. (Patient not taking: Reported on 07/20/2021)   No facility-administered encounter medications on file as of 07/25/2021.  Questions and concerns were addressed.  Provided  general support and encouragement, no other unmet needs identified  Thank you for the opportunity to participate in the care of Ms. Mccants.  The palliative care team will continue to follow. Please call our office at [phone] if we can be of additional assistance.   This chart was dictated using voice recognition software.  Despite best efforts to proofread,  errors can occur which can change the documentation meaning.   Kyndra Condron Z Orvis Stann, NP ,

## 2021-07-27 ENCOUNTER — Encounter: Payer: Self-pay | Admitting: Oncology

## 2021-07-27 ENCOUNTER — Inpatient Hospital Stay: Payer: Medicare Other | Attending: Oncology | Admitting: Oncology

## 2021-07-27 ENCOUNTER — Encounter: Payer: Self-pay | Admitting: *Deleted

## 2021-07-27 VITALS — BP 115/81 | HR 49 | Temp 97.3°F | Resp 16

## 2021-07-27 DIAGNOSIS — R918 Other nonspecific abnormal finding of lung field: Secondary | ICD-10-CM | POA: Diagnosis present

## 2021-07-27 DIAGNOSIS — I7122 Aneurysm of the aortic arch, without rupture: Secondary | ICD-10-CM | POA: Insufficient documentation

## 2021-07-27 DIAGNOSIS — I729 Aneurysm of unspecified site: Secondary | ICD-10-CM

## 2021-07-27 NOTE — Progress Notes (Signed)
Spoke with Felicia Sellers regarding rescheduling her gyn oncology appointment. She is still in rehab and does not desire to have appointment rescheduled until she is feeling better. Will have gyn onc review recent imaging and assess need for follow up.

## 2021-07-30 NOTE — Progress Notes (Signed)
Hematology/Oncology Consult note [facility] [phone] [phone]  Patient Care Team: [facility] as PCP - General Clent Jacks, RN as Registered Nurse Telford Nab, RN as Oncology Nurse Navigator   Name of the patient: Felicia Sellers  [id number]  1941-03-28    Reason for referral-possible lung mass   Referring physician- DR. Wouk  Date of visit: 07/30/21   History of presenting illness- Patient is a 80 year old female with prior history of lung cancer s/p right upper lobectomy back in 2004.  She follows up with Dr. Raul Del from pulmonary as an outpatient.  She also has known bilateral ovarian masses for which she was following up with GYN oncology.  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  Significant volume loss in the right lung with air bronchograms and cutoff sign in the right bronchus intermedius concerning for an obstructive mass/endobronchial process.  Patient was discharged to a rehab and subsequently underwent an outpatient PET scan and is here to discuss the results of the same.  Prior to going to the rehab patient was living at home with her nephew who helps her with her ADLs.  She does not have any other close family.  ECOG PS- 3  Pain scale- 0   Review of systems- Review of Systems  Constitutional:  Positive for malaise/fatigue. Negative for chills, fever and weight loss.  HENT:  Negative for congestion, ear discharge and nosebleeds.   Eyes:  Negative for blurred vision.  Respiratory:  Positive for shortness of breath. Negative for cough, hemoptysis, sputum production and wheezing.   Cardiovascular:  Negative for chest pain, palpitations, orthopnea and claudication.  Gastrointestinal:  Negative  for abdominal pain, blood in stool, constipation, diarrhea, heartburn, melena, nausea and vomiting.  Genitourinary:  Negative for dysuria, flank pain, frequency, hematuria and urgency.  Musculoskeletal:  Negative for back pain, joint pain and myalgias.  Skin:  Negative for rash.  Neurological:  Negative for dizziness, tingling, focal weakness, seizures, weakness and headaches.  Endo/Heme/Allergies:  Does not bruise/bleed easily.  Psychiatric/Behavioral:  Negative for depression and suicidal ideas. The patient does not have insomnia.    Allergies  Allergen Reactions   Aspirin Other (See Comments)    High risk of bleeding    Patient Active Problem List   Diagnosis Date Noted   Acute respiratory failure with hypoxia ([place]) 07/09/2021   CAP (community acquired pneumonia) 07/09/2021   Obstructive pneumonia 07/09/2021   Coronary artery disease involving native coronary artery of native heart without angina pectoris 07/19/2020   COPD, moderate ([place]) 07/19/2020   Personal history of lung cancer 07/19/2020   Community acquired pneumonia 06/27/2020   Acute CHF (congestive heart failure) ([place]) 06/26/2020   Leukocytosis 06/26/2020   Hypoxia 06/26/2020   Thoracic aortic aneurysm without rupture 08/16/2018   Kyphosis 08/07/2018   Hypertension 08/07/2018   Tobacco use 08/07/2018   Age-related osteoporosis without current pathological fracture 12/21/2016   Chronic hyponatremia 11/04/2015   Chronic low back pain 06/18/2015   Cardiomyopathy ([place]) 08/13/2014   Left bundle branch block (LBBB) 07/23/2014   Pedal edema 07/23/2014   SOB (shortness of breath) on exertion 07/23/2014     Past Medical History:  Diagnosis Date   Cancer ([place])    right lung removed - lung cancer 1/3 lung removed  CHF (congestive heart failure) ([facility])    COPD (chronic obstructive pulmonary disease) ([facility])    Coronary artery disease    History of stomach ulcers    Hypertension    Pneumonia      Past Surgical  History:  Procedure Laterality Date   LUNG REMOVAL, PARTIAL Left     Social History   Socioeconomic History   Marital status: Single    Spouse name: Not on file   Number of children: Not on file   Years of education: Not on file   Highest education level: Not on file  Occupational History   Not on file  Tobacco Use   Smoking status: Former    Packs/day: 1.00    Years: 66.00    Pack years: 66.00    Types: Cigarettes    Quit date: 06/26/2020    Years since quitting: 1.0   Smokeless tobacco: Never  Vaping Use   Vaping Use: Never used  Substance and Sexual Activity   Alcohol use: Not Currently    Comment: use to drink alot ( vodka) 6 months ago   Drug use: Never   Sexual activity: Not Currently  Other Topics Concern   Not on file  Social History Narrative   Not on file   Social Determinants of Health   Financial Resource Strain: Not on file  Food Insecurity: Not on file  Transportation Needs: Not on file  Physical Activity: Not on file  Stress: Not on file  Social Connections: Not on file  Intimate Partner Violence: Not on file     Family History  Problem Relation Age of Onset   Breast cancer Neg Hx      Current Outpatient Medications:    Albuterol Sulfate (PROAIR RESPICLICK) 161 (90 Base) MCG/ACT AEPB, Inhale 2 puffs into the lungs every 6 (six) hours as needed., Disp: 1 each, Rfl: 11   amLODipine (NORVASC) 10 MG tablet, Take 10 mg by mouth daily., Disp: , Rfl:    atorvastatin (LIPITOR) 40 MG tablet, Take 40 mg by mouth daily. Per bottle pt is taking 20mg ., Disp: , Rfl:    calcium carbonate (OS-CAL - DOSED IN MG OF ELEMENTAL CALCIUM) 1250 (500 Ca) MG tablet, Take 1 tablet by mouth 2 (two) times daily with a meal. , Disp: , Rfl:    Cholecalciferol 1000 units capsule, Take 1,000 Units by mouth daily., Disp: , Rfl:    gabapentin (NEURONTIN) 300 MG capsule, Take 300 mg by mouth 3 (three) times daily., Disp: , Rfl:    lisinopril (ZESTRIL) 10 MG tablet, Take 10 mg by  mouth daily. , Disp: , Rfl:    metoprolol succinate (TOPROL-XL) 25 MG 24 hr tablet, Take 25 mg by mouth daily., Disp: , Rfl:    docusate sodium (COLACE) 100 MG capsule, Take 100 mg by mouth daily.  (Patient not taking: Reported on 07/27/2021), Disp: , Rfl:    ipratropium (ATROVENT) 0.03 % nasal spray, Place 2 sprays into the nose 2 (two) times daily as needed., Disp: , Rfl:    loperamide (IMODIUM) 2 MG capsule, Take 4 mg by mouth as needed for diarrhea or loose stools. (Patient not taking: Reported on 07/27/2021), Disp: , Rfl:    Physical exam:  Vitals:   07/27/21 0837  BP: 115/81  Pulse: (!) 49  Resp: 16  Temp: (!) 97.3 F (36.3 C)  SpO2: 90%   Physical Exam Constitutional:      Comments: She is elderly and frail.  On home oxygen.  Sitting in a wheelchair  Eyes:     Pupils: Pupils are equal, round, and reactive to light.  Cardiovascular:     Rate and Rhythm: Normal rate and regular rhythm.     Heart sounds: Normal heart sounds.  Pulmonary:     Effort: Pulmonary effort is normal.     Comments: Scattered bilateral wheezing Abdominal:     General: Bowel sounds are normal.     Palpations: Abdomen is soft.  Skin:    General: Skin is warm and dry.  Neurological:     Mental Status: She is alert and oriented to person, place, and time.       CMP Latest Ref Rng & Units 07/20/2021  Glucose 70 - 99 mg/dL 142(H)  BUN 8 - 23 mg/dL 17  Creatinine 0.44 - 1.00 mg/dL 0.76  Sodium 135 - 145 mmol/L 133(L)  Potassium 3.5 - 5.1 mmol/L 3.8  Chloride 98 - 111 mmol/L 94(L)  CO2 22 - 32 mmol/L 30  Calcium 8.9 - 10.3 mg/dL 9.1  Total Protein 6.5 - 8.1 g/dL -  Total Bilirubin 0.3 - 1.2 mg/dL -  Alkaline Phos 38 - 126 U/L -  AST 15 - 41 U/L -  ALT 0 - 44 U/L -   CBC Latest Ref Rng & Units 07/11/2021  WBC 4.0 - 10.5 K/uL 14.4(H)  Hemoglobin 12.0 - 15.0 g/dL 13.1  Hematocrit 36.0 - 46.0 % 38.6  Platelets 150 - 400 K/uL 311    No images are attached to the encounter.  CT CHEST WO  CONTRAST  Result Date: 07/09/2021 CLINICAL DATA:  Abnormal x-ray with pleural effusion. Shortness of breath today. EXAM: CT CHEST WITHOUT CONTRAST TECHNIQUE: Multidetector CT imaging of the chest was performed following the standard protocol without IV contrast. COMPARISON:  06/26/2020.  Chest radiograph 07/08/2021 FINDINGS: Cardiovascular: Mild cardiac enlargement. No pericardial effusion. Calcification in the mitral valve annulus, coronary arteries, and aorta. 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 Aneurysm of the lower descending thoracic aorta with maximal AP diameter of about 5.5 cm. Appearance is similar to previous study. Mediastinum/Nodes: Thyroid gland is unremarkable. Moderately prominent mediastinal lymph nodes with pretracheal nodes measuring up to about 1.8 cm short axis dimension. Esophagus is decompressed. Lungs/Pleura: Small right pleural effusion. Significant volume loss in the right lung with heterogeneous consolidation and air bronchograms throughout the right lung but most prominent in the right middle and right lower lungs. Minimal residual aeration in the right upper lung. There is a cut off sign to the right bronchus intermedius which may indicate obstructing mass or endobronchial process. The left lung demonstrates diffuse emphysematous changes with peripheral fibrosis and multiple pulmonary nodules which have developed since the prior study. Overall, changes are likely to represent a right hilar or endobronchial mass causing postobstructive change on the right and metastatic disease throughout both lungs. Upper Abdomen: Prominent vascular calcifications. No acute process identified in the visualized upper abdomen. Musculoskeletal: Degenerative changes in the spine. No destructive bone lesions. IMPRESSION: 1. Collapse and consolidation of the right lung with suspicion of obstructing lesion involving the right bronchus intermedius.  This is likely to represent primary lung cancer. 2. Diffuse pulmonary nodule seen best in the aerated left lung, likely metastatic disease. 3. Mediastinal lymphadenopathy is likely metastatic. 4. Underlying emphysematous and chronic bronchitic changes in the lungs with slight peripheral fibrosis. 5. 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 6. Diffuse aortic atherosclerosis. Electronically Signed   By: Gwyndolyn Saxon  Gerilyn Nestle M.D.   On: 07/09/2021 01:23   MR BRAIN W WO CONTRAST  Result Date: 07/10/2021 CLINICAL DATA:  Hematologic malignancy, staging. EXAM: MRI HEAD WITHOUT AND WITH CONTRAST TECHNIQUE: Multiplanar, multiecho pulse sequences of the brain and surrounding structures were obtained without and with intravenous contrast. CONTRAST:  80mL GADAVIST GADOBUTROL 1 MMOL/ML IV SOLN COMPARISON:  Head CT 05/28/2014 FINDINGS: Brain: No enhancement or swelling to suggest metastatic disease. No acute infarction, hemorrhage, hydrocephalus, extra-axial collection or mass lesion. Chronic small vessel ischemia that is extensive in the hemispheric white matter and mild in the pons. Small remote bilateral cerebellar infarcts. Vascular: Normal flow voids. Skull and upper cervical spine: Normal marrow signal. Sinuses/Orbits: Negative. IMPRESSION: 1. No acute finding or evidence of metastatic disease. 2. Extensive chronic small vessel ischemia. Electronically Signed   By: Jorje Guild M.D.   On: 07/10/2021 11:39   NM PET Image Initial (PI) Skull Base To Thigh  Result Date: 07/26/2021 CLINICAL DATA:  Initial treatment strategy for right lung mass. EXAM: NUCLEAR MEDICINE PET SKULL BASE TO THIGH TECHNIQUE: 6.1 mCi F-18 FDG was injected intravenously. Full-ring PET imaging was performed from the skull base to thigh after the radiotracer. CT data was obtained and used for attenuation correction and anatomic localization. Fasting blood glucose: 78 mg/dl COMPARISON:  CT chest 07/09/2021 FINDINGS: Mediastinal  blood pool activity: SUV max 1.9 Liver activity: SUV max NA NECK: No significant abnormal hypermetabolic activity in this region. Incidental CT findings: Bilateral common carotid atherosclerotic calcifications. CHEST: Substantial clearing of prior airspace opacity with mild residual peripheral nodularity in the remaining right lung, particularly peripherally in the right lower lobe, which warrants surveillance but is currently not of high suspicion for active malignancy. This clustered peripheral nodularity has a maximum SUV up to 1.9. No central obstructing hypermetabolic lesion is identified. Right lower paratracheal lymph node 1.2 cm in short axis with maximum SUV 2.4, previous maximum SUV 3.5. Incidental CT findings: Centrilobular emphysema. Coronary, aortic arch, and branch vessel atherosclerotic vascular disease. 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 Aneurysm of the descending thoracic aorta 5.1 cm in diameter. Moderate cardiomegaly. Dense mitral valve calcification. Trace pericardial effusion posteriorly. Small bilateral pleural effusions with associated passive atelectasis. Postoperative findings in the right lung. There is also some mild peripheral scattered nodularity in the left lung which is moderately improved compared to 07/09/2021. ABDOMEN/PELVIS: Complex cystic lesion in the left adnexa measuring 11.3 by 6.3 cm on image 191 series 4 with several loculations of varying density, but photopenic on the PET data. Smaller photopenic lesion of the right ovary. Incidental CT findings: Gallbladder wall thickening, nonspecific although inflammation is not readily excluded. Extensive atherosclerosis. Sigmoid colon diverticulosis. Diffuse subcutaneous edema. SKELETON: No significant abnormal hypermetabolic activity in this region. Incidental CT findings: Degenerative hip arthropathy, right greater than left. IMPRESSION: 1. Interval clearance of the vast majority of the airspace opacity in the right  lung. There some small peripheral areas of scattered nodularity which merit chest CT surveillance but which are not substantially hypermetabolic today, and accordingly may potentially be inflammatory/infectious. Postoperative findings in the right lung. 2. Stable mild paratracheal adenopathy, although SUV has decreased compared to the prior chest CT of 08/22/2018. 3. Complex cystic lesion of the left adnexa measuring up to 11.3 cm in long axis, photopenic. Smaller lesion of the right ovary is not substantially hypermetabolic either. 4. 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 Recommend semi-annual imaging followup by CTA or MRA  and referral to cardiothoracic surgery if not already obtained. This recommendation follows 2010 ACCF/AHA/AATS/ACR/ASA/SCA/SCAI/SIR/STS/SVM Guidelines for the Diagnosis and Management of Patients With Thoracic Aortic Disease. Circulation. 2010; 121: O115-B26. Aortic aneurysm NOS (ICD10-I71.9) 5. Other imaging findings of potential clinical significance: Aortic Atherosclerosis (ICD10-I70.0) and Emphysema (ICD10-J43.9). Coronary atherosclerosis and extensive abdominal atherosclerosis. Moderate cardiomegaly. Dense mitral valve calcifications. Small bilateral pleural effusions along with subcutaneous edema suggesting mild third spacing of fluid. Nonspecific wall thickening in the gallbladder. Sigmoid colon diverticulosis. Degenerative hip arthropathy. Electronically Signed   By: Van Clines M.D.   On: 07/26/2021 07:13   DG Chest Portable 1 View  Result Date: 07/08/2021 CLINICAL DATA:  feeling short of breath today Best films possible due to pt condition. CHf/COPd exacerbation.e val pulm congestion/infiltrate EXAM: PORTABLE CHEST 1 VIEW COMPARISON:  Chest x-ray 06/26/2020 FINDINGS: The heart and mediastinal contours are grossly unchanged. Aortic calcification. Right lung pulmonary staples noted. Almost complete opacification of the right  hemithorax. Increased interstitial markings bilaterally. Likely some component of right pleural effusion. No left pleural effusion. No pneumothorax. No acute osseous abnormality. IMPRESSION: Almost complete opacification of the right hemithorax as well as increased interstitial markings bilaterally. Likely some component of right pleural effusion. Findings suggestive of infection/inflammation. Underlying malignancy not excluded. Recommend CT chest with intravenous contrast for further evaluation. Electronically Signed   By: Iven Finn M.D.   On: 07/08/2021 22:48   ECHOCARDIOGRAM COMPLETE  Result Date: 07/09/2021    ECHOCARDIOGRAM REPORT   Patient Name:   GRICEL COPEN Date of Exam: 07/09/2021 Medical Rec #:  [id number]    Height:       61.0 in Accession #:    [id number]   Weight:       116.0 lb Date of Birth:  1941/02/05    BSA:          1.498 m Patient Age:    21 years     BP:           114/58 mmHg Patient Gender: F            HR:           116 bpm. Exam Location:  [facility] Procedure: 2D Echo, Cardiac Doppler and Color Doppler Indications:     CHF-Acute Diastolic E32.12  History:         Patient has prior history of Echocardiogram examinations. CHF,                  CAD; Risk Factors:Hypertension.  Sonographer:     Alyse Low Roar Referring Phys:  [id number] Athena Masse Diagnosing Phys: Isaias Cowman MD IMPRESSIONS  1. Left ventricular ejection fraction, by estimation, is 55 to 60%. The left ventricle has normal function. The left ventricle has no regional wall motion abnormalities. Left ventricular diastolic parameters are indeterminate.  2. Right ventricular systolic function is normal. The right ventricular size is normal.  3. The mitral valve is normal in structure. Mild mitral valve regurgitation. No evidence of mitral stenosis.  4. Tricuspid valve regurgitation is moderate.  5. The aortic valve is normal in structure. Aortic valve regurgitation is not visualized. No aortic stenosis is present.  6. The  inferior vena cava is normal in size with greater than 50% respiratory variability, suggesting right atrial pressure of 3 mmHg. FINDINGS  Left Ventricle: Left ventricular ejection fraction, by estimation, is 55 to 60%. The left ventricle has normal function. The left ventricle has no regional wall motion abnormalities. The left ventricular internal cavity size was  normal in size. There is  no left ventricular hypertrophy. Left ventricular diastolic parameters are indeterminate. Right Ventricle: The right ventricular size is normal. No increase in right ventricular wall thickness. Right ventricular systolic function is normal. Left Atrium: Left atrial size was normal in size. Right Atrium: Right atrial size was normal in size. Pericardium: There is no evidence of pericardial effusion. Mitral Valve: The mitral valve is normal in structure. Mild mitral valve regurgitation. No evidence of mitral valve stenosis. Tricuspid Valve: The tricuspid valve is normal in structure. Tricuspid valve regurgitation is moderate . No evidence of tricuspid stenosis. Aortic Valve: The aortic valve is normal in structure. Aortic valve regurgitation is not visualized. No aortic stenosis is present. Aortic valve peak gradient measures 7.1 mmHg. Pulmonic Valve: The pulmonic valve was normal in structure. Pulmonic valve regurgitation is not visualized. No evidence of pulmonic stenosis. Aorta: The aortic root is normal in size and structure. Venous: The inferior vena cava is normal in size with greater than 50% respiratory variability, suggesting right atrial pressure of 3 mmHg. IAS/Shunts: No atrial level shunt detected by color flow Doppler.  LEFT VENTRICLE PLAX 2D LVIDd:         4.10 cm  Diastology LVIDs:         2.90 cm  LV e' medial:    9.03 cm/s LV PW:         1.10 cm  LV E/e' medial:  17.7 LV IVS:        1.20 cm  LV e' lateral:   6.31 cm/s LVOT diam:     1.70 cm  LV E/e' lateral: 25.4 LVOT Area:     2.27 cm  RIGHT VENTRICLE RV Basal  diam:  2.40 cm RV Mid diam:    2.10 cm RV S prime:     12.20 cm/s TAPSE (M-mode): 1.5 cm LEFT ATRIUM             Index       RIGHT ATRIUM           Index LA diam:        3.70 cm 2.47 cm/m  RA Area:     14.30 cm LA Vol (A2C):   41.2 ml 27.50 ml/m RA Volume:   32.30 ml  21.56 ml/m LA Vol (A4C):   31.9 ml 21.29 ml/m LA Biplane Vol: 36.7 ml 24.49 ml/m  AORTIC VALVE                PULMONIC VALVE AV Area (Vmax): 1.93 cm    PV Vmax:        1.22 m/s AV Vmax:        133.00 cm/s PV Peak grad:   6.0 mmHg AV Peak Grad:   7.1 mmHg    RVOT Peak grad: 2 mmHg LVOT Vmax:      113.00 cm/s  AORTA Ao Root diam: 2.70 cm MITRAL VALVE                TRICUSPID VALVE MV Area (PHT): 6.83 cm     TR Peak grad:   35.0 mmHg MV Decel Time: 111 msec     TR Vmax:        296.00 cm/s MV E velocity: 160.00 cm/s                             SHUNTS  Systemic Diam: 1.70 cm Isaias Cowman MD Electronically signed by Isaias Cowman MD Signature Date/Time: 07/09/2021/1:24:30 PM    Final     Assessment and plan- Patient is a 80 y.o. female with history of lung cancer back in 2004 s/p resection, COPD CHF CAD hypertension found to have possible endobronchial lesion during hospitalization for pneumonia in September 2022  I have reviewed PET/CT images independently and discussed findings with the patient which overall shows significant improvement in the airspace opacity in the right lung.Peripheral areas of scattered nodularity not significantly hypermetabolic on the PET scan and can be followed up with a repeat CT in 2 to 3 months time.  Overall based on her PET appearance patient does not require bronchoscopy at this time.  She has a 4 cm aneurysm of the aortic arch and 5.1 cm descending thoracic aneurysm for which I will refer her to vascular surgery.  Known complex cystic lesion of the left adnexa for which she will need to follow-up with GYN oncology and she has seen Dr. Fransisca Connors in the past.  I doubt she  would be a surgical candidate though.  Patient wants to know if she can come off her oxygen and I explained to her that this will need to be assessed at the rehab facility on ambulation and if she maintains her oxygen saturation she can come off oxygen at that time.  I will see her back in 2 months with a CT chest without contrast   Thank you for this kind referral and the opportunity to participate in the care of this patient   Visit Diagnosis 1. Lung mass   2. Aneurysm ([place])     Dr. Randa Evens, MD, MPH [facility] [phone] 07/30/2021 2:13 PM

## 2021-08-04 NOTE — Progress Notes (Signed)
Patient ID: Felicia Sellers, female    DOB: 1941-03-27, 80 y.o.   MRN: [id number]   Felicia Sellers is a 80 y/o female with a history of lung cancer, CAD, HTN, COPD, recent tobacco use, current alcohol use and chronic heart failure.  Echo report from 07/09/21 reviewed and showed an EF of 55-60% along with mild MR but without LVH/LAE. Echo report from 06/28/20 reviewed and showed an EF of 70-75% along with moderate LVH, moderately elevated PA pressure, moderate MR and mild/moderate LAE.   Admitted 07/08/21 due to shortness of breath and cough. Found to be hypoxic with sat of 87%. Cardiology and pulmonology consults obtained. Placed on oxygen and given nebulizer treatment. Placed on antibiotics for possible pneumonia. Discharged after 5 days.  She presents today for her follow up visit with a chief complaint of minimal fatigue upon moderate exertion. She describes this as chronic in nature having been present for several years. She has associated cough, shortness of breath, light-headedness, pedal edema, chronic back pain and anxiety along with this. She denies any difficulty sleeping, abdominal distention, palpitations, chest pain or weight gain.   Past Medical History:  Diagnosis Date   Cancer ([place])    right lung removed - lung cancer 1/3 lung removed    CHF (congestive heart failure) (HCC)    COPD (chronic obstructive pulmonary disease) (HCC)    Coronary artery disease    History of stomach ulcers    Hypertension    Pneumonia    Past Surgical History:  Procedure Laterality Date   LUNG REMOVAL, PARTIAL Left    Family History  Problem Relation Age of Onset   Breast cancer Neg Hx    Social History   Tobacco Use   Smoking status: Former    Packs/day: 1.00    Years: 66.00    Pack years: 66.00    Types: Cigarettes    Quit date: 06/26/2020    Years since quitting: 1.1   Smokeless tobacco: Never  Substance Use Topics   Alcohol use: Not Currently    Comment: use to drink alot ( vodka) 6 months  ago   Allergies  Allergen Reactions   Aspirin Other (See Comments)    High risk of bleeding   Prior to Admission medications   Medication Sig Start Date End Date Taking? Authorizing Provider  Albuterol Sulfate (PROAIR RESPICLICK) 597 (90 Base) MCG/ACT AEPB Inhale 2 puffs into the lungs every 6 (six) hours as needed. 06/28/20  Yes Danford, Suann Larry, MD  amLODipine (NORVASC) 10 MG tablet Take 10 mg by mouth daily.   Yes [provider]  atorvastatin (LIPITOR) 40 MG tablet Take 40 mg by mouth daily. Per bottle pt is taking 20mg .   Yes [provider]  calcium carbonate (OS-CAL - DOSED IN MG OF ELEMENTAL CALCIUM) 1250 (500 Ca) MG tablet Take 1 tablet by mouth 2 (two) times daily with a meal.    Yes [provider]  Cholecalciferol 1000 units capsule Take 1,000 Units by mouth daily.   Yes [provider]  diclofenac Sodium (VOLTAREN) 1 % GEL Apply topically 2 (two) times daily.   Yes [provider]  docusate sodium (COLACE) 100 MG capsule Take 100 mg by mouth daily.   Yes [provider]  gabapentin (NEURONTIN) 300 MG capsule Take 300 mg by mouth 3 (three) times daily.   Yes [provider]  ipratropium (ATROVENT) 0.03 % nasal spray Place 2 sprays into the nose 2 (two) times daily as  needed. 03/22/21 03/22/22 Yes [provider]  lisinopril (ZESTRIL) 10 MG tablet Take 10 mg by mouth daily.    Yes [provider]  loperamide (IMODIUM) 2 MG capsule Take 4 mg by mouth as needed for diarrhea or loose stools.   Yes [provider]  metoprolol succinate (TOPROL-XL) 25 MG 24 hr tablet Take 25 mg by mouth daily.   Yes [provider]  sodium chloride 1 g tablet Take 2 g by mouth 3 (three) times daily.   Yes [provider]    Review of Systems  Constitutional:  Positive for fatigue (minimal). Negative for appetite change.  HENT:  Negative for congestion, postnasal drip and sore throat.   Eyes:  Negative.   Respiratory:  Positive for cough and shortness of breath.   Cardiovascular:  Positive for leg swelling. Negative for chest pain and palpitations.  Gastrointestinal:  Negative for abdominal distention and abdominal pain.  Endocrine: Negative.   Genitourinary: Negative.   Musculoskeletal:  Positive for back pain. Negative for neck pain.  Skin: Negative.   Allergic/Immunologic: Negative.   Neurological:  Positive for light-headedness (when changing positions too quickly). Negative for dizziness.  Hematological:  Negative for adenopathy. Bruises/bleeds easily.  Psychiatric/Behavioral:  Negative for dysphoric mood and sleep disturbance (sleeping on 2 pillows). The patient is nervous/anxious.    Vitals:   08/05/21 0941  BP: 119/72  Pulse: 84  Resp: 16  SpO2: 100%  Weight: 120 lb (54.4 kg)  Height: 5\' 3"  (1.6 m)   Wt Readings from Last 3 Encounters:  08/05/21 120 lb (54.4 kg)  07/25/21 118 lb (53.5 kg)  07/20/21 108 lb (49 kg)   Lab Results  Component Value Date   CREATININE 0.76 07/20/2021   CREATININE 0.73 07/13/2021   CREATININE 0.55 07/12/2021    Physical Exam Vitals and nursing note reviewed.  Constitutional:      Appearance: Normal appearance.  HENT:     Head: Normocephalic and atraumatic.  Cardiovascular:     Rate and Rhythm: Normal rate and regular rhythm.  Pulmonary:     Effort: Pulmonary effort is normal. No respiratory distress.     Breath sounds: Examination of the right-upper field reveals rhonchi. Examination of the left-upper field reveals rhonchi. Examination of the right-lower field reveals rhonchi. Examination of the left-lower field reveals rhonchi. Rhonchi present. No wheezing or rales.  Abdominal:     Palpations: Abdomen is soft.     Tenderness: There is no abdominal tenderness.  Musculoskeletal:        General: No tenderness.     Cervical back: Normal range of motion and neck supple.     Right lower leg: Edema (2+ pitting) present.      Left lower leg: Edema (2+ pitting) present.  Skin:    General: Skin is warm and dry.  Neurological:     General: No focal deficit present.     Mental Status: She is alert and oriented to person, place, and time.  Psychiatric:        Mood and Affect: Mood normal.        Behavior: Behavior normal.        Thought Content: Thought content normal.   Assessment & Plan:  1: Chronic heart failure with preserved ejection fraction along without structural changes- - NYHA class II - euvolemic today - being weighed daily at [facility]; reminded to call for an overnight weight gain of > 2 pounds or a weekly weight gain of > 5 pounds -  weight 108 pounds at last visit 2 weeks ago; today is 120 so question accuracy of last visit weight - Says she has cut back on her salt but still adds salt to some things - palliative care visit done 07/25/21 - order written for facility to put on compression socks with removal at bedtime - BNP 07/08/21 was 288.7  2: HTN- - BP looks good (119/72) - saw PCP Vevelyn Royals) 03/22/21 but now seeing PCP at [facility] - BMP 07/20/21 reviewed and showed sodium 133, potassium 3.8, creatinine 0.76 and GFR >60 - currently taking sodium chloride tablets  3: COPD/ tobacco use- - wearing oxygen at 3L around the clock - quit smoking 06/26/20  4: Anxiety- - patient admits that she's anxious about "everything" and worries about "everybody" - says that she was told that that can't go home until she quits worrying about everybody because of how her anxiety affects her health   Facility medication list reviewed.   Return in 3 months or sooner for any questions/problems before then.

## 2021-08-05 ENCOUNTER — Encounter: Payer: Self-pay | Admitting: Family

## 2021-08-05 ENCOUNTER — Ambulatory Visit (HOSPITAL_BASED_OUTPATIENT_CLINIC_OR_DEPARTMENT_OTHER): Payer: Medicare Other | Admitting: Family

## 2021-08-05 ENCOUNTER — Other Ambulatory Visit: Payer: Self-pay

## 2021-08-05 VITALS — BP 119/72 | HR 84 | Resp 16 | Ht 63.0 in | Wt 120.0 lb

## 2021-08-05 DIAGNOSIS — I2510 Atherosclerotic heart disease of native coronary artery without angina pectoris: Secondary | ICD-10-CM | POA: Insufficient documentation

## 2021-08-05 DIAGNOSIS — I5032 Chronic diastolic (congestive) heart failure: Secondary | ICD-10-CM

## 2021-08-05 DIAGNOSIS — F419 Anxiety disorder, unspecified: Secondary | ICD-10-CM | POA: Insufficient documentation

## 2021-08-05 DIAGNOSIS — I10 Essential (primary) hypertension: Secondary | ICD-10-CM | POA: Diagnosis not present

## 2021-08-05 DIAGNOSIS — I110 Hypertensive heart disease with heart failure: Secondary | ICD-10-CM | POA: Insufficient documentation

## 2021-08-05 DIAGNOSIS — Z886 Allergy status to analgesic agent status: Secondary | ICD-10-CM | POA: Insufficient documentation

## 2021-08-05 DIAGNOSIS — Z79899 Other long term (current) drug therapy: Secondary | ICD-10-CM | POA: Insufficient documentation

## 2021-08-05 DIAGNOSIS — Z902 Acquired absence of lung [part of]: Secondary | ICD-10-CM | POA: Insufficient documentation

## 2021-08-05 DIAGNOSIS — Z87891 Personal history of nicotine dependence: Secondary | ICD-10-CM | POA: Insufficient documentation

## 2021-08-05 DIAGNOSIS — J449 Chronic obstructive pulmonary disease, unspecified: Secondary | ICD-10-CM | POA: Insufficient documentation

## 2021-08-05 NOTE — Patient Instructions (Signed)
Continue weighing daily and call for an overnight weight gain of > 2 pounds or a weekly weight gain of >5 pounds. 

## 2021-08-07 ENCOUNTER — Other Ambulatory Visit: Payer: Self-pay

## 2021-08-07 ENCOUNTER — Inpatient Hospital Stay: Payer: Medicare Other

## 2021-08-07 ENCOUNTER — Emergency Department: Payer: Medicare Other

## 2021-08-07 ENCOUNTER — Inpatient Hospital Stay
Admission: EM | Admit: 2021-08-07 | Discharge: 2021-08-11 | DRG: 190 | Disposition: A | Discharge status: Home Health | Payer: Medicare Other | Source: Skilled Nursing Facility | Attending: Internal Medicine | Admitting: Internal Medicine

## 2021-08-07 ENCOUNTER — Encounter: Payer: Self-pay | Admitting: Internal Medicine

## 2021-08-07 DIAGNOSIS — Z902 Acquired absence of lung [part of]: Secondary | ICD-10-CM | POA: Diagnosis not present

## 2021-08-07 DIAGNOSIS — E785 Hyperlipidemia, unspecified: Secondary | ICD-10-CM | POA: Diagnosis present

## 2021-08-07 DIAGNOSIS — Z79899 Other long term (current) drug therapy: Secondary | ICD-10-CM

## 2021-08-07 DIAGNOSIS — J439 Emphysema, unspecified: Secondary | ICD-10-CM

## 2021-08-07 DIAGNOSIS — J9601 Acute respiratory failure with hypoxia: Secondary | ICD-10-CM | POA: Diagnosis not present

## 2021-08-07 DIAGNOSIS — I700 Atherosclerosis of aorta: Secondary | ICD-10-CM | POA: Diagnosis present

## 2021-08-07 DIAGNOSIS — Z9889 Other specified postprocedural states: Secondary | ICD-10-CM

## 2021-08-07 DIAGNOSIS — Z20822 Contact with and (suspected) exposure to covid-19: Secondary | ICD-10-CM | POA: Diagnosis present

## 2021-08-07 DIAGNOSIS — N839 Noninflammatory disorder of ovary, fallopian tube and broad ligament, unspecified: Secondary | ICD-10-CM | POA: Diagnosis present

## 2021-08-07 DIAGNOSIS — Z85118 Personal history of other malignant neoplasm of bronchus and lung: Secondary | ICD-10-CM | POA: Diagnosis not present

## 2021-08-07 DIAGNOSIS — E871 Hypo-osmolality and hyponatremia: Secondary | ICD-10-CM | POA: Diagnosis present

## 2021-08-07 DIAGNOSIS — R0602 Shortness of breath: Secondary | ICD-10-CM | POA: Diagnosis present

## 2021-08-07 DIAGNOSIS — F1010 Alcohol abuse, uncomplicated: Secondary | ICD-10-CM | POA: Diagnosis present

## 2021-08-07 DIAGNOSIS — J9621 Acute and chronic respiratory failure with hypoxia: Secondary | ICD-10-CM | POA: Diagnosis present

## 2021-08-07 DIAGNOSIS — I5032 Chronic diastolic (congestive) heart failure: Secondary | ICD-10-CM | POA: Diagnosis present

## 2021-08-07 DIAGNOSIS — N838 Other noninflammatory disorders of ovary, fallopian tube and broad ligament: Secondary | ICD-10-CM

## 2021-08-07 DIAGNOSIS — I509 Heart failure, unspecified: Secondary | ICD-10-CM | POA: Insufficient documentation

## 2021-08-07 DIAGNOSIS — I2510 Atherosclerotic heart disease of native coronary artery without angina pectoris: Secondary | ICD-10-CM | POA: Diagnosis present

## 2021-08-07 DIAGNOSIS — I5082 Biventricular heart failure: Secondary | ICD-10-CM | POA: Diagnosis present

## 2021-08-07 DIAGNOSIS — Z9981 Dependence on supplemental oxygen: Secondary | ICD-10-CM | POA: Diagnosis not present

## 2021-08-07 DIAGNOSIS — I4891 Unspecified atrial fibrillation: Secondary | ICD-10-CM | POA: Diagnosis present

## 2021-08-07 DIAGNOSIS — F17200 Nicotine dependence, unspecified, uncomplicated: Secondary | ICD-10-CM

## 2021-08-07 DIAGNOSIS — Z886 Allergy status to analgesic agent status: Secondary | ICD-10-CM | POA: Diagnosis not present

## 2021-08-07 DIAGNOSIS — Z66 Do not resuscitate: Secondary | ICD-10-CM | POA: Diagnosis present

## 2021-08-07 DIAGNOSIS — I110 Hypertensive heart disease with heart failure: Secondary | ICD-10-CM | POA: Diagnosis present

## 2021-08-07 DIAGNOSIS — J432 Centrilobular emphysema: Secondary | ICD-10-CM | POA: Diagnosis present

## 2021-08-07 DIAGNOSIS — J441 Chronic obstructive pulmonary disease with (acute) exacerbation: Secondary | ICD-10-CM

## 2021-08-07 DIAGNOSIS — Z7189 Other specified counseling: Secondary | ICD-10-CM | POA: Diagnosis not present

## 2021-08-07 DIAGNOSIS — I5033 Acute on chronic diastolic (congestive) heart failure: Secondary | ICD-10-CM

## 2021-08-07 DIAGNOSIS — J90 Pleural effusion, not elsewhere classified: Secondary | ICD-10-CM | POA: Diagnosis present

## 2021-08-07 DIAGNOSIS — I719 Aortic aneurysm of unspecified site, without rupture: Secondary | ICD-10-CM

## 2021-08-07 DIAGNOSIS — I7123 Aneurysm of the descending thoracic aorta, without rupture: Secondary | ICD-10-CM | POA: Diagnosis present

## 2021-08-07 DIAGNOSIS — F17210 Nicotine dependence, cigarettes, uncomplicated: Secondary | ICD-10-CM | POA: Diagnosis present

## 2021-08-07 DIAGNOSIS — I7122 Aneurysm of the aortic arch, without rupture: Secondary | ICD-10-CM | POA: Diagnosis present

## 2021-08-07 DIAGNOSIS — I10 Essential (primary) hypertension: Secondary | ICD-10-CM | POA: Diagnosis present

## 2021-08-07 DIAGNOSIS — R918 Other nonspecific abnormal finding of lung field: Secondary | ICD-10-CM

## 2021-08-07 DIAGNOSIS — Z720 Tobacco use: Secondary | ICD-10-CM | POA: Diagnosis present

## 2021-08-07 DIAGNOSIS — E43 Unspecified severe protein-calorie malnutrition: Secondary | ICD-10-CM | POA: Diagnosis present

## 2021-08-07 LAB — RESP PANEL BY RT-PCR (FLU A&B, COVID) ARPGX2
Influenza A by PCR: NEGATIVE
Influenza B by PCR: NEGATIVE
SARS Coronavirus 2 by RT PCR: NEGATIVE

## 2021-08-07 LAB — CBC WITH DIFFERENTIAL/PLATELET
Abs Immature Granulocytes: 0.04 10*3/uL (ref 0.00–0.07)
Basophils Absolute: 0.1 10*3/uL (ref 0.0–0.1)
Basophils Relative: 1 %
Eosinophils Absolute: 0.1 10*3/uL (ref 0.0–0.5)
Eosinophils Relative: 1 %
HCT: 33.7 % — ABNORMAL LOW (ref 36.0–46.0)
Hemoglobin: 11.5 g/dL — ABNORMAL LOW (ref 12.0–15.0)
Immature Granulocytes: 1 %
Lymphocytes Relative: 19 %
Lymphs Abs: 1.4 10*3/uL (ref 0.7–4.0)
MCH: 32.8 pg (ref 26.0–34.0)
MCHC: 34.1 g/dL (ref 30.0–36.0)
MCV: 96 fL (ref 80.0–100.0)
Monocytes Absolute: 0.7 10*3/uL (ref 0.1–1.0)
Monocytes Relative: 10 %
Neutro Abs: 5.1 10*3/uL (ref 1.7–7.7)
Neutrophils Relative %: 68 %
Platelets: 255 10*3/uL (ref 150–400)
RBC: 3.51 MIL/uL — ABNORMAL LOW (ref 3.87–5.11)
RDW: 15.9 % — ABNORMAL HIGH (ref 11.5–15.5)
WBC: 7.4 10*3/uL (ref 4.0–10.5)
nRBC: 0 % (ref 0.0–0.2)

## 2021-08-07 LAB — EXPECTORATED SPUTUM ASSESSMENT W GRAM STAIN, RFLX TO RESP C

## 2021-08-07 LAB — BASIC METABOLIC PANEL
Anion gap: 8 (ref 5–15)
BUN: 17 mg/dL (ref 8–23)
CO2: 26 mmol/L (ref 22–32)
Calcium: 8.8 mg/dL — ABNORMAL LOW (ref 8.9–10.3)
Chloride: 96 mmol/L — ABNORMAL LOW (ref 98–111)
Creatinine, Ser: 0.67 mg/dL (ref 0.44–1.00)
GFR, Estimated: >60 mL/min (ref >60)
Glucose, Bld: 110 mg/dL — ABNORMAL HIGH (ref 70–99)
Potassium: 4.1 mmol/L (ref 3.5–5.1)
Sodium: 130 mmol/L — ABNORMAL LOW (ref 135–145)

## 2021-08-07 LAB — TROPONIN I (HIGH SENSITIVITY)
Troponin I (High Sensitivity): 9 ng/L (ref <18)
Troponin I (High Sensitivity): 8 ng/L (ref <18)

## 2021-08-07 LAB — LACTIC ACID, PLASMA: Lactic Acid, Venous: 1.4 mmol/L (ref 0.5–1.9)

## 2021-08-07 LAB — BRAIN NATRIURETIC PEPTIDE: B Natriuretic Peptide: 581.7 pg/mL — ABNORMAL HIGH (ref 0.0–100.0)

## 2021-08-07 LAB — PROCALCITONIN: Procalcitonin: <0.1 ng/mL

## 2021-08-07 MED ORDER — IPRATROPIUM-ALBUTEROL 0.5-2.5 (3) MG/3ML IN SOLN
3.0000 mL | Freq: Once | RESPIRATORY_TRACT | Status: AC
  Administered 2021-08-07: 3 mL via RESPIRATORY_TRACT
  Filled 2021-08-07: qty 3

## 2021-08-07 MED ORDER — HYDROCODONE-ACETAMINOPHEN 5-325 MG PO TABS: 1.0000 | ORAL_TABLET | ORAL | Status: DC | PRN

## 2021-08-07 MED ORDER — METHYLPREDNISOLONE SODIUM SUCC 125 MG IJ SOLR
60.0000 mg | Freq: Two times a day (BID) | INTRAMUSCULAR | Status: AC
  Administered 2021-08-07 (×2): 60 mg via INTRAVENOUS
  Filled 2021-08-07 (×2): qty 2

## 2021-08-07 MED ORDER — AMLODIPINE BESYLATE 5 MG PO TABS
10.0000 mg | ORAL_TABLET | Freq: Every day | ORAL | Status: DC
  Filled 2021-08-07: qty 2

## 2021-08-07 MED ORDER — PREDNISONE 20 MG PO TABS: 40.0000 mg | ORAL_TABLET | Freq: Every day | ORAL | Status: DC

## 2021-08-07 MED ORDER — DOCUSATE SODIUM 100 MG PO CAPS
100.0000 mg | ORAL_CAPSULE | Freq: Two times a day (BID) | ORAL | Status: DC
  Administered 2021-08-07 – 2021-08-11 (×9): 100 mg via ORAL
  Filled 2021-08-07 (×9): qty 1

## 2021-08-07 MED ORDER — ONDANSETRON HCL 4 MG PO TABS: 4.0000 mg | ORAL_TABLET | Freq: Four times a day (QID) | ORAL | Status: DC | PRN

## 2021-08-07 MED ORDER — METOPROLOL SUCCINATE ER 25 MG PO TB24
25.0000 mg | ORAL_TABLET | Freq: Every day | ORAL | Status: DC
  Administered 2021-08-07 – 2021-08-11 (×5): 25 mg via ORAL
  Filled 2021-08-07 (×5): qty 1

## 2021-08-07 MED ORDER — ACETAMINOPHEN 650 MG RE SUPP
650.0000 mg | Freq: Four times a day (QID) | RECTAL | Status: DC | PRN
  Filled 2021-08-07: qty 1

## 2021-08-07 MED ORDER — ATORVASTATIN CALCIUM 20 MG PO TABS
40.0000 mg | ORAL_TABLET | Freq: Every day | ORAL | Status: DC
  Administered 2021-08-07 – 2021-08-10 (×4): 40 mg via ORAL
  Filled 2021-08-07 (×4): qty 2

## 2021-08-07 MED ORDER — ACETAMINOPHEN 325 MG PO TABS
650.0000 mg | ORAL_TABLET | Freq: Four times a day (QID) | ORAL | Status: DC | PRN
  Filled 2021-08-07: qty 2

## 2021-08-07 MED ORDER — SODIUM CHLORIDE 0.9% FLUSH
3.0000 mL | Freq: Two times a day (BID) | INTRAVENOUS | Status: DC
  Administered 2021-08-07 – 2021-08-11 (×8): 3 mL via INTRAVENOUS

## 2021-08-07 MED ORDER — LISINOPRIL 10 MG PO TABS
10.0000 mg | ORAL_TABLET | Freq: Every day | ORAL | Status: DC
  Filled 2021-08-07: qty 1

## 2021-08-07 MED ORDER — ENOXAPARIN SODIUM 40 MG/0.4ML IJ SOSY
40.0000 mg | PREFILLED_SYRINGE | INTRAMUSCULAR | Status: DC
  Administered 2021-08-07 – 2021-08-10 (×4): 40 mg via SUBCUTANEOUS
  Filled 2021-08-07 (×4): qty 0.4

## 2021-08-07 MED ORDER — ONDANSETRON HCL 4 MG/2ML IJ SOLN: 4.0000 mg | Freq: Four times a day (QID) | INTRAMUSCULAR | Status: DC | PRN

## 2021-08-07 MED ORDER — GUAIFENESIN ER 600 MG PO TB12
600.0000 mg | ORAL_TABLET | Freq: Two times a day (BID) | ORAL | Status: DC | PRN
  Administered 2021-08-07 – 2021-08-10 (×2): 600 mg via ORAL
  Filled 2021-08-07 (×2): qty 1

## 2021-08-07 MED ORDER — SODIUM CHLORIDE 0.9 % IV SOLN
2.0000 g | Freq: Two times a day (BID) | INTRAVENOUS | Status: DC
  Administered 2021-08-07 – 2021-08-08 (×4): 2 g via INTRAVENOUS
  Filled 2021-08-07 (×6): qty 2

## 2021-08-07 MED ORDER — IPRATROPIUM-ALBUTEROL 0.5-2.5 (3) MG/3ML IN SOLN
3.0000 mL | Freq: Four times a day (QID) | RESPIRATORY_TRACT | Status: DC
  Administered 2021-08-07 – 2021-08-11 (×17): 3 mL via RESPIRATORY_TRACT
  Filled 2021-08-07 (×18): qty 3

## 2021-08-07 MED ORDER — ALBUTEROL SULFATE (2.5 MG/3ML) 0.083% IN NEBU
5.0000 mg | INHALATION_SOLUTION | Freq: Once | RESPIRATORY_TRACT | Status: AC
  Administered 2021-08-07: 5 mg via RESPIRATORY_TRACT
  Filled 2021-08-07: qty 6

## 2021-08-07 MED ORDER — ZOLPIDEM TARTRATE 5 MG PO TABS
5.0000 mg | ORAL_TABLET | Freq: Every evening | ORAL | Status: DC | PRN
  Administered 2021-08-07 – 2021-08-10 (×3): 5 mg via ORAL
  Filled 2021-08-07 (×3): qty 1

## 2021-08-07 MED ORDER — IOHEXOL 300 MG/ML  SOLN
75.0000 mL | Freq: Once | INTRAMUSCULAR | Status: AC | PRN
  Administered 2021-08-07: 75 mL via INTRAVENOUS

## 2021-08-07 MED ORDER — ALBUTEROL SULFATE (2.5 MG/3ML) 0.083% IN NEBU
2.5000 mg | INHALATION_SOLUTION | RESPIRATORY_TRACT | Status: DC | PRN
  Administered 2021-08-08 – 2021-08-10 (×2): 2.5 mg via RESPIRATORY_TRACT
  Filled 2021-08-07 (×2): qty 3

## 2021-08-07 MED ORDER — NICOTINE 14 MG/24HR TD PT24
14.0000 mg | MEDICATED_PATCH | Freq: Every day | TRANSDERMAL | Status: DC
  Administered 2021-08-07 – 2021-08-11 (×5): 14 mg via TRANSDERMAL
  Filled 2021-08-07 (×5): qty 1

## 2021-08-07 MED ORDER — POLYETHYLENE GLYCOL 3350 17 G PO PACK: 17.0000 g | PACK | Freq: Every day | ORAL | Status: DC | PRN

## 2021-08-07 MED ORDER — MORPHINE SULFATE (PF) 2 MG/ML IV SOLN: 2.0000 mg | INTRAVENOUS | Status: DC | PRN

## 2021-08-07 MED ORDER — HYDRALAZINE HCL 20 MG/ML IJ SOLN: 5.0000 mg | INTRAMUSCULAR | Status: DC | PRN

## 2021-08-07 MED ORDER — SODIUM CHLORIDE 0.9 % IV SOLN: 2.0000 g | Freq: Three times a day (TID) | INTRAVENOUS | Status: DC

## 2021-08-07 MED ORDER — FUROSEMIDE 10 MG/ML IJ SOLN
40.0000 mg | Freq: Once | INTRAMUSCULAR | Status: AC
  Administered 2021-08-07: 40 mg via INTRAVENOUS
  Filled 2021-08-07: qty 4

## 2021-08-07 MED ORDER — BISACODYL 5 MG PO TBEC: 5.0000 mg | DELAYED_RELEASE_TABLET | Freq: Every day | ORAL | Status: DC | PRN

## 2021-08-07 MED ORDER — GABAPENTIN 300 MG PO CAPS
300.0000 mg | ORAL_CAPSULE | Freq: Three times a day (TID) | ORAL | Status: DC
  Administered 2021-08-07 – 2021-08-11 (×14): 300 mg via ORAL
  Filled 2021-08-07 (×14): qty 1

## 2021-08-07 NOTE — ED Notes (Signed)
Report received from Kate, RN

## 2021-08-07 NOTE — ED Notes (Signed)
Patient transported to CT 

## 2021-08-07 NOTE — ED Notes (Signed)
Nephew called and updated as to patient's status.

## 2021-08-07 NOTE — ED Provider Notes (Signed)
[facility] Emergency Department Provider Note  ____________________________________________   Event Date/Time   First MD Initiated Contact with Patient 08/07/21 0404     (approximate)  I have reviewed the triage vital signs and the nursing notes.   HISTORY  Chief Complaint Shortness of Breath    HPI Felicia Sellers is a 80 y.o. female with history of lung cancer status post partial lung resection, CHF, COPD, CAD, hypertension who comes from [place] with EMS for concerns for shortness of breath and productive cough for 3 days.  She denies any known fevers, chills, chest pain.  Wears 2 L of oxygen chronically and staff at [facility] had to increase it to 4 L before EMS arrived and her sats were still in the 80s.  EMS increased oxygen to 6 L and gave 2 DuoNeb treatments 125 mg of IV Solu-Medrol.  Patient was admitted to the hospital twice for respiratory failure and community-acquired pneumonia in September 2022.        Past Medical History:  Diagnosis Date   Cancer [facility])    right lung removed - lung cancer 1/3 lung removed    CHF (congestive heart failure) (HCC)    COPD (chronic obstructive pulmonary disease) ([place])    Coronary artery disease    History of stomach ulcers    Hypertension    Pneumonia     Patient Active Problem List   Diagnosis Date Noted   Acute respiratory failure with hypoxia ([place]) 07/09/2021   CAP (community acquired pneumonia) 07/09/2021   Obstructive pneumonia 07/09/2021   Coronary artery disease involving native coronary artery of native heart without angina pectoris 07/19/2020   COPD, moderate ([place]) 07/19/2020   Personal history of lung cancer 07/19/2020   Community acquired pneumonia 06/27/2020   Acute CHF (congestive heart failure) ([place]) 06/26/2020   Leukocytosis 06/26/2020   Hypoxia 06/26/2020   Thoracic aortic aneurysm without rupture 08/16/2018   Kyphosis 08/07/2018   Hypertension 08/07/2018    Tobacco use 08/07/2018   Age-related osteoporosis without current pathological fracture 12/21/2016   Chronic hyponatremia 11/04/2015   Chronic low back pain 06/18/2015   Cardiomyopathy ([place]) 08/13/2014   Left bundle branch block (LBBB) 07/23/2014   Pedal edema 07/23/2014   SOB (shortness of breath) on exertion 07/23/2014    Past Surgical History:  Procedure Laterality Date   LUNG REMOVAL, PARTIAL Left     Prior to Admission medications   Medication Sig Start Date End Date Taking? Authorizing Provider  Albuterol Sulfate (PROAIR RESPICLICK) 062 (90 Base) MCG/ACT AEPB Inhale 2 puffs into the lungs every 6 (six) hours as needed. 06/28/20   Danford, Suann Larry, MD  amLODipine (NORVASC) 10 MG tablet Take 10 mg by mouth daily.    [provider]  atorvastatin (LIPITOR) 40 MG tablet Take 40 mg by mouth daily. Per bottle pt is taking 20mg .    [provider]  calcium carbonate (OS-CAL - DOSED IN MG OF ELEMENTAL CALCIUM) 1250 (500 Ca) MG tablet Take 1 tablet by mouth 2 (two) times daily with a meal.     [provider]  Cholecalciferol 1000 units capsule Take 1,000 Units by mouth daily.    [provider]  diclofenac Sodium (VOLTAREN) 1 % GEL Apply topically 2 (two) times daily.    [provider]  docusate sodium (COLACE) 100 MG capsule Take 100 mg by mouth daily.    [provider]  gabapentin (NEURONTIN) 300 MG capsule Take 300 mg by mouth 3 (  three) times daily.    [provider]  ipratropium (ATROVENT) 0.03 % nasal spray Place 2 sprays into the nose 2 (two) times daily as needed. 03/22/21 03/22/22  [provider]  lisinopril (ZESTRIL) 10 MG tablet Take 10 mg by mouth daily.     [provider]  loperamide (IMODIUM) 2 MG capsule Take 4 mg by mouth as needed for diarrhea or loose stools.    [provider]  metoprolol succinate (TOPROL-XL) 25 MG 24 hr tablet Take 25 mg by mouth daily.    [provider]  sodium chloride 1 g tablet Take 2 g by mouth 3 (three) times daily.    [provider]    Allergies Aspirin  Family History  Problem Relation Age of Onset   Breast cancer Neg Hx     Social History Social History   Tobacco Use   Smoking status: Former    Packs/day: 1.00    Years: 66.00    Pack years: 66.00    Types: Cigarettes    Quit date: 06/26/2020    Years since quitting: 1.1   Smokeless tobacco: Never  Vaping Use   Vaping Use: Never used  Substance Use Topics   Alcohol use: Not Currently    Comment: use to drink alot ( vodka) 6 months ago   Drug use: Never    Review of Systems Constitutional: No fever. Eyes: No visual changes. ENT: No sore throat. Cardiovascular: Denies chest pain. Respiratory: + shortness of breath. Gastrointestinal: No nausea, vomiting, diarrhea. Genitourinary: Negative for dysuria. Musculoskeletal: Negative for back pain. Skin: Negative for rash. Neurological: Negative for focal weakness or numbness.  ____________________________________________   PHYSICAL EXAM:  VITAL SIGNS: Today's Vitals   08/07/21 0423 08/07/21 0424  BP: 135/90   Pulse: 96   Resp: 17   Temp: 97.7 F (36.5 C)   TempSrc: Oral   SpO2: 95%   Weight:  53.5 kg  Height:  5\' 3"  (1.6 m)  PainSc: 0-No pain    Body mass index is 20.9 kg/m.  CONSTITUTIONAL: Alert and oriented and responds appropriately to questions.  Elderly HEAD: Normocephalic EYES: Conjunctivae clear, pupils appear equal, EOM appear intact ENT: normal nose; moist mucous membranes NECK: Supple, normal ROM CARD: Irregularly irregular and rate controlled; S1 and S2 appreciated; no murmurs, no clicks, no rubs, no gallops RESP: Patient is tachypneic but speaking full sentences.  Rhonchorous breath sounds heard diffusely with expiratory wheezing.  No rales.  No hypoxia on nasal cannula.  No respiratory distress. ABD/GI: Normal bowel sounds; non-distended; soft, non-tender, no  rebound, no guarding, no peritoneal signs, no hepatosplenomegaly BACK: The back appears normal EXT: Normal ROM in all joints; no deformity noted, +1 bilateral lower extremity edema, no calf tenderness or calf swelling SKIN: Normal color for age and race; warm; no rash on exposed skin NEURO: Moves all extremities equally PSYCH: The patient's mood and manner are appropriate.  ____________________________________________   LABS (all labs ordered are listed, but only abnormal results are displayed)  Labs Reviewed  CBC WITH DIFFERENTIAL/PLATELET - Abnormal; Notable for the following components:      Result Value   RBC 3.51 (*)    Hemoglobin 11.5 (*)    HCT 33.7 (*)    RDW 15.9 (*)    All other components within normal limits  BASIC METABOLIC PANEL - Abnormal; Notable for the following components:   Sodium 130 (*)    Chloride 96 (*)    Glucose, Bld 110 (*)  Calcium 8.8 (*)    All other components within normal limits  BRAIN NATRIURETIC PEPTIDE - Abnormal; Notable for the following components:   B Natriuretic Peptide 581.7 (*)    All other components within normal limits  CULTURE, BLOOD (ROUTINE X 2)  CULTURE, BLOOD (ROUTINE X 2)  RESP PANEL BY RT-PCR (FLU A&B, COVID) ARPGX2  LACTIC ACID, PLASMA  PROCALCITONIN  TROPONIN I (HIGH SENSITIVITY)  TROPONIN I (HIGH SENSITIVITY)   ____________________________________________  EKG   EKG Interpretation  Date/Time:  Sunday August 07 2021 04:19:59 EDT Ventricular Rate:  90 PR Interval:    QRS Duration: 133 QT Interval:  408 QTC Calculation: 500 R Axis:   127 Text Interpretation: Atrial fibrillation Nonspecific intraventricular conduction delay Probable lateral infarct, age indeterminate Anterior infarct, old No significant change since last tracing Confirmed by Pryor Curia [phone]) on 08/07/2021 4:23:17 AM        ____________________________________________  RADIOLOGY Jessie Foot Lovette Merta, personally viewed and evaluated these  images (plain radiographs) as part of my medical decision making, as well as reviewing the written report by the radiologist.  ED MD interpretation:  CXR shows bilateral pleural effusions  Official radiology report(s): DG Chest Portable 1 View  Result Date: 08/07/2021 CLINICAL DATA:  Increasing shortness of breath EXAM: PORTABLE CHEST 1 VIEW COMPARISON:  07/08/2021 FINDINGS: Improved right lung aeration. Worse left base aeration at least partially from pleural fluid. Small right pleural effusion. Generalized reticulonodular opacity. Chronic cardiomegaly. IMPRESSION: 1. Left more than right pleural effusion with obscured or opacified left lower lobe. 2. Significant clearance of right-sided pneumonia since chest x-ray last month. 3. Background chronic lung disease and nodularity reference recent PET. Electronically Signed   By: Jorje Guild M.D.   On: 08/07/2021 05:04    ____________________________________________   PROCEDURES  Procedure(s) performed (including Critical Care):  Procedures  CRITICAL CARE Performed by: Cyril Mourning Concha Sudol   Total critical care time: 45 minutes  Critical care time was exclusive of separately billable procedures and treating other patients.  Critical care was necessary to treat or prevent imminent or life-threatening deterioration.  Critical care was time spent personally by me on the following activities: development of treatment plan with patient and/or surrogate as well as nursing, discussions with consultants, evaluation of patient's response to treatment, examination of patient, obtaining history from patient or surrogate, ordering and performing treatments and interventions, ordering and review of laboratory studies, ordering and review of radiographic studies, pulse oximetry and re-evaluation of patient's condition.  ____________________________________________   INITIAL IMPRESSION / ASSESSMENT AND PLAN / ED COURSE  As part of my medical decision  making, I reviewed the following data within the [place] notes reviewed and incorporated, Labs reviewed , EKG interpreted , Old EKG reviewed, Old chart reviewed, Radiograph reviewed , Discussed with admitting physician , and Notes from prior ED visits         Patient here with increasing shortness of breath, cough for 3 days.  Differential includes but not limited to pneumonia, CHF, COPD exacerbation, PE, pneumothorax, pneumomediastinum, ACS.  EKG shows no new ischemic change.  Will obtain labs, chest x-ray.  Will give DuoNeb treatment.  Anticipate admission given worsening oxygen requirement.  ED PROGRESS  Patient's CXR shows clearing or recent R sided PNA but shows bilateral pleural effusions.  Troponin negative.  BNP elevated.  Peripheral edema in LE.  Will give Lasix.  WBC normal.  Lactic normal.  Procalc negative.  COVID and flu negative.  Unlikely infectious in nature.  Suspect COPD and CHF exac.  Lungs still rhonchorous with wheezing.  Will give another albuterol treatment.  Will admit given still symptomatic and has increasing O2 requirement from baseline.  6:33 AM Discussed patient's case with hospitalist, Dr. Damita Dunnings.  I have recommended admission and patient (and family if present) agree with this plan. Admitting physician will place admission orders.   I reviewed all nursing notes, vitals, pertinent previous records and reviewed/interpreted all EKGs, lab and urine results, imaging (as available).   ____________________________________________   FINAL CLINICAL IMPRESSION(S) / ED DIAGNOSES  Final diagnoses:  SOB (shortness of breath)  COPD exacerbation (HCC)  Acute on chronic congestive heart failure, unspecified heart failure type ([place])  Acute respiratory failure with hypoxia [facility])     ED Discharge Orders     None       *Please note:  Jamilla Galli was evaluated in Emergency Department on 08/07/2021 for the symptoms described in the  history of present illness. She was evaluated in the context of the global COVID-19 pandemic, which necessitated consideration that the patient might be at risk for infection with the SARS-CoV-2 virus that causes COVID-19. Institutional protocols and algorithms that pertain to the evaluation of patients at risk for COVID-19 are in a state of rapid change based on information released by regulatory bodies including the CDC and federal and state organizations. These policies and algorithms were followed during the patient's care in the ED.  Some ED evaluations and interventions may be delayed as a result of limited staffing during and the pandemic.*   Note:  This document was prepared using Dragon voice recognition software and may include unintentional dictation errors.    Abuk Selleck, Delice Bison, DO 08/07/21 [phone]

## 2021-08-07 NOTE — H&P (Signed)
History and Physical    Media Pizzini ZJI:[id number] DOB: Sep 15, 1941 DOA: 08/07/2021  PCP: [facility] Consultants:  Janese Banks - oncology; Aleskerov - pulmnology; [place]; [place] - cardiology Patient coming from: [facility]; NOK:  Everardo All, [phone]  Chief Complaint: SOB  HPI: Felicia Sellers is a 80 y.o. female with medical history significant of lung cancer s/p lobectomy; chronic diastolic CHF; COPD; CAD; and HTN presenting with SOB.  She reports that she has been at [facility] rehab since her last hospitalization and has been very unhappy there; her nephew is collecting her things to help her return home now.  She started feeling SOB with coarse but dry cough about 3 days ago.  She was previously hospitalized with PNA and stopped smoking just prior to that admission.  No fever.  +LE edema but chronic in nature.  She reports that she is not on home O2.  She was last admitted from 9/16-22 with acute respiratory failure with hypoxia and was discharged on 2L home O2.  She was thought to have post-obstructive PNA with concern for primary lung cancer.  Subsequent outpatient PET scan was performed that was not diagnostic and bronch was not recommended; she is suggested to have repeat CT in 3 months.  Meanwhile, she does have a complex cystic lesion of the L adnexa and has been recommended to f/u with GYN Onc (Carl).  She also is involved with palliative care and was last seen on 10/3 with plan for f/u in 2 weeks.  Her nephew had the folks come pick up the oxygen after 2 weeks following last hospitalization.  He thinks she will need placement again.  He reports that she is continuing to smoke.  She was a "heavy alcoholic for 70 years" and stopped at the time of last hospitalization - maybe.  He doesn't make any decisions and defers all decisions to her; he will make decisions only if she cannot.  Her nephew is happy to call back if we leave a message for him, but isn't  always available to answer the phone when called.   ED Course: Carry[place], per Damita Dunnings:  patient with COPd and CHF exac with increasing O2 requirement from baseline  Review of Systems: As per HPI; otherwise review of systems reviewed and negative.   Ambulatory Status:  Ambulates without assistance, reports she has a cane that she is supposed to use in her closet  COVID Vaccine Status:   Complete plus boosters  Past Medical History:  Diagnosis Date   Cancer ([facility])    right lung removed - lung cancer 1/3 lung removed    CHF (congestive heart failure) (HCC)    COPD (chronic obstructive pulmonary disease) (HCC)    Coronary artery disease    History of stomach ulcers    Hypertension    Pneumonia     Past Surgical History:  Procedure Laterality Date   LUNG REMOVAL, PARTIAL Left     Social History   Socioeconomic History   Marital status: Single    Spouse name: Not on file   Number of children: Not on file   Years of education: Not on file   Highest education level: Not on file  Occupational History   Not on file  Tobacco Use   Smoking status: Former    Packs/day: 1.00    Years: 66.00    Pack years: 66.00    Types: Cigarettes    Quit date: 06/26/2020    Years since quitting: 1.1  Smokeless tobacco: Never  Vaping Use   Vaping Use: Never used  Substance and Sexual Activity   Alcohol use: Not Currently    Comment: use to drink alot ( vodka) 6 months ago   Drug use: Never   Sexual activity: Not Currently  Other Topics Concern   Not on file  Social History Narrative   Not on file   Social Determinants of Health   Financial Resource Strain: Not on file  Food Insecurity: Not on file  Transportation Needs: Not on file  Physical Activity: Not on file  Stress: Not on file  Social Connections: Not on file  Intimate Partner Violence: Not on file    Allergies  Allergen Reactions   Aspirin Other (See Comments)    High risk of bleeding    Family History  Problem  Relation Age of Onset   Breast cancer Neg Hx     Prior to Admission medications   Medication Sig Start Date End Date Taking? Authorizing Provider  Albuterol Sulfate (PROAIR RESPICLICK) 093 (90 Base) MCG/ACT AEPB Inhale 2 puffs into the lungs every 6 (six) hours as needed. 06/28/20  Yes Danford, Suann Larry, MD  amLODipine (NORVASC) 10 MG tablet Take 10 mg by mouth daily.   Yes [provider]  atorvastatin (LIPITOR) 40 MG tablet Take 40 mg by mouth at bedtime.   Yes [provider]  calcium carbonate (OS-CAL - DOSED IN MG OF ELEMENTAL CALCIUM) 1250 (500 Ca) MG tablet Take 1 tablet by mouth 2 (two) times daily with a meal.    Yes [provider]  Cholecalciferol 1000 units capsule Take 1,000 Units by mouth daily.   Yes [provider]  diclofenac Sodium (VOLTAREN) 1 % GEL Apply topically 2 (two) times daily. Apply to upper back and shoulders every morning and at bedtime related to muscle weakness.   Yes [provider]  docusate sodium (COLACE) 100 MG capsule Take 100 mg by mouth daily.   Yes [provider]  gabapentin (NEURONTIN) 300 MG capsule Take 300 mg by mouth 3 (three) times daily.   Yes [provider]  ipratropium (ATROVENT) 0.02 % nebulizer solution Take 0.5 mg by nebulization every 12 (twelve) hours as needed for wheezing or shortness of breath.   Yes [provider]  lisinopril (ZESTRIL) 10 MG tablet Take 10 mg by mouth daily.    Yes [provider]  loperamide (IMODIUM) 2 MG capsule Take 4 mg by mouth as needed for diarrhea or loose stools.   Yes [provider]  metoprolol succinate (TOPROL-XL) 25 MG 24 hr tablet Take 25 mg by mouth daily.   Yes [provider]  sodium chloride 1 g tablet Take 2 g by mouth 3 (three) times daily.   Yes [provider]    Physical Exam: Vitals:   08/07/21 1230 08/07/21 1300 08/07/21 1330 08/07/21 1400  BP: 123/72 113/77 120/89 120/83   Pulse: 95 84 97 80  Resp:  18  20  Temp:      TempSrc:      SpO2: 100% 96% 92% 97%  Weight:      Height:         General:  Appears calm and comfortable and is in NAD, cantankerous, on 4L [place] O2, frail, cachectic Eyes:  PERRL, EOMI, normal lids, iris ENT:  grossly normal hearing, lips & tongue, mmm Neck:  no LAD, masses or thyromegaly Cardiovascular:  RRR, no m/r/g. 2-3+ LE edema, reports chronic.  Respiratory:  Barrel chested, generally poor air movement.  Increased respiratory effort with conversational dyspnea. Abdomen:  soft, NT, ND Skin:  no rash or induration seen on limited exam Musculoskeletal:  grossly normal tone BUE/BLE, good ROM, no bony abnormality Psychiatric:  cantankerous mood and affect, speech fluent and appropriate, AOx3 Neurologic:  CN 2-12 grossly intact, moves all extremities in coordinated fashion    Radiological Exams on Admission: Independently reviewed - see discussion in A/P where applicable  CT CHEST W CONTRAST  Result Date: 08/07/2021 CLINICAL DATA:  COPD exacerbation EXAM: CT CHEST WITH CONTRAST TECHNIQUE: Multidetector CT imaging of the chest was performed during intravenous contrast administration. CONTRAST:  12mL OMNIPAQUE IOHEXOL 300 MG/ML  SOLN COMPARISON:  07/09/2021 FINDINGS: Cardiovascular: Cardiomegaly. No pericardial effusion. Advanced atherosclerosis of the aorta with irregular plaque diffusely and the descending aneurysm which measures 4.9 Cm in diameter. No pulmonary artery filling defect. Mediastinum/Nodes: No adenopathy mass. Mild food retention of the esophagus. Lungs/Pleura: Moderate to large left pleural effusion which is simple density with extensive atelectasis. Significant clearing right-sided pneumonia since prior CT. Postoperative right upper lobe. Improved nodularity on prior which is also attributed to infection. Emphysema. Upper Abdomen: Extensive atherosclerosis. Hepatic venous reflux attributed to right heart failure  Musculoskeletal: Exaggerated kyphosis.  No acute finding IMPRESSION: 1. Moderate to large simple left pleural effusion with multi segment atelectasis. 2. Essentially resolved pneumonia seen on prior CT. 3. Advanced atherosclerosis especially of the aorta with descending aneurysm measuring up to 4.9 cm in diameter. Follow-up depends on true morbidity/life expectancy. Aortic Atherosclerosis (ICD10-I70.0) and Emphysema (ICD10-J43.9). Electronically Signed   By: Jorje Guild M.D.   On: 08/07/2021 10:22   DG Chest Portable 1 View  Result Date: 08/07/2021 CLINICAL DATA:  Increasing shortness of breath EXAM: PORTABLE CHEST 1 VIEW COMPARISON:  07/08/2021 FINDINGS: Improved right lung aeration. Worse left base aeration at least partially from pleural fluid. Small right pleural effusion. Generalized reticulonodular opacity. Chronic cardiomegaly. IMPRESSION: 1. Left more than right pleural effusion with obscured or opacified left lower lobe. 2. Significant clearance of right-sided pneumonia since chest x-ray last month. 3. Background chronic lung disease and nodularity reference recent PET. Electronically Signed   By: Jorje Guild M.D.   On: 08/07/2021 05:04    EKG: Independently reviewed.  Afib with rate 90; IVCD; nonspecific ST changes with no evidence of acute ischemia   Labs on Admission: I have personally reviewed the available labs and imaging studies at the time of the admission.  Pertinent labs:   Na++ 130 Gluocose 110 BNP 581.7 HS troponin 9 Lactate 1.4 Procalcitonin <0.10 WBC 7.4 Hgb 11.5 Blood cultures pending COVID/flu negative   Assessment/Plan Principal Problem:   COPD with acute exacerbation (HCC) Active Problems:   Hypertension   Tobacco use   Pleural effusion on left   Acute on chronic respiratory failure associated with a COPD exacerbation -Patient's shortness of breath and cough are most likely caused by acute COPD exacerbation and large pleural effusion -She has  history of O2-dependent COPD but refused O2 after last hospitalization -Her nephew reports that she continues to smoke and is eager to return home so that she can continue to do so -She does not have fever or leukocytosis.  -Chest x-ray shows left-sided effusion - possible parapneumonic effusion - with improvement in prior findings (prior concern for R lung mass but currently surveillance only since this appears to be improving) -There was also some concern for CHF given her LE edema (patient reports this is chronic) and elevated  BNP; she was given Lasix but there is lower suspicion for this currently -CT ordered and it shows a large pleural effusion; will order IR-directed thoracentesis with fluid sent for gram stain, culture, and cytology -will admit patient - it seems likely that she will need several days of hospitalization to show sufficient improvement for discharge. -Nebulizers: scheduled Duoneb and prn albuterol -Solu-Medrol 60 mg IV BID -> Prednisone 40 mg PO daily -IV Cefepime -Continue Breztri, Singulair -Coordinated care with TOC team/PT/OT/Nutrition/RT consults  HTN -Continue Toprol XL -Marginal BP while in the ER so will hold Norvasc and Lisinopril  HLD -Continue Lipitor  Chronic diastolic CHF -As noted above, there was some concern for CHF as the cause at the time of ER evaluation -Currently appears to be compensated from this standpoint other than LE edema (likely stasis, will probably benefit from compression stockings)  Tobacco dependence -patient reports that she quit prior to her last hospitalization -Her nephew reports ongoing tobacco use at her facility -Patch ordered    Note: This patient has been tested and is negative for the novel coronavirus COVID-19. She has been fully vaccinated against COVID-19.    DVT prophylaxis: Lovenox  Code Status:  DNR - confirmed with patient Family Communication: None present; I spoke with her nephew at the time of  admission Disposition Plan:  The patient is from: SNF  Anticipated d/c is to: to be determined - she does not plan to return to SNF but her nephew reports that she needs maybe ALF placement  Anticipated d/c date will depend on clinical response to treatment, but possibly in the next 2-3 days if she has excellent response to treatment  Patient is currently: acutely ill Consults called: TOC team/PT/OT/Nutrition/RT  Admission status: Admit - It is my clinical opinion that admission to INPATIENT is reasonable and necessary because this patient will require at least 2 midnights in the hospital to treat this condition based on the medical complexity of the problems presented.  Given the aforementioned information, the predictability of an adverse outcome is felt to be significant.       Karmen Bongo MD [facility]   How to contact the [facility] Attending or Consulting provider [place] or covering provider during after hours [place], for this patient?  Check the care team in [facility] and look for a) attending/consulting TRH provider listed and b) the [facility] team listed Log into [email or web address] and use [place] universal password to access. If you do not have the password, please contact the hospital operator. Locate the [facility] provider you are looking for under [facility] and page to a number that you can be directly reached. If you still have difficulty reaching the provider, please page the [facility] (Director on Call) for the Hospitalists listed on amion for assistance.   08/07/2021, 3:56 PM

## 2021-08-07 NOTE — Progress Notes (Signed)
PHARMACY NOTE:  ANTIMICROBIAL RENAL DOSAGE ADJUSTMENT  Current antimicrobial regimen includes a mismatch between antimicrobial dosage and estimated renal function.  As per policy approved by the Pharmacy & Therapeutics and Medical Executive Committees, the antimicrobial dosage will be adjusted accordingly.  Current antimicrobial dosage:  Cefepime 2g Iv every 8 hours   Indication: COPD exacerbation   Renal Function:  Estimated Creatinine Clearance: 44.8 mL/min (by C-G formula based on SCr of 0.67 mg/dL).     Antimicrobial dosage has been changed to:  Cefepime 2g IV every 12 hours   Additional comments:   Thank you for allowing pharmacy to be a part of this patient's care.  Pernell Dupre, PharmD, BCPS Clinical Pharmacist 08/07/2021 8:57 AM

## 2021-08-07 NOTE — TOC Initial Note (Addendum)
Transition of Care [facility]) - Initial/Assessment Note    Patient Details  Name: Felicia Sellers MRN: [id number] Date of Birth: 02-14-1941  [facility]) CM/SW Contact:    Ova Freshwater Phone Number: [phone] 08/07/2021, 10:14 AM  Clinical Narrative:                  Patient presents to [facility] ED from [facility] for SNF, due to increase SOB.  Patient has dx of lung cancer and is on 2L O2 at baseline. Patient typically lives at home and her main care giver is Darrold Junker Barrister's clerk)  [phone] [facility]).  Patient is DNR and needs some assistance with ADLs.  Expected Discharge Plan: [place] Barriers to Discharge: Continued Medical Work up   Patient Goals and CMS Choice        Expected Discharge Plan and Services Expected Discharge Plan: [place] In-house Referral: Clinical Social Work   Post Acute Care Choice: [place] Living arrangements for the past 2 months: [place] ([place])                                      Prior Living Arrangements/Services Living arrangements for the past 2 months: [place] ([place]) Lives with:: Facility Resident Patient language and need for interpreter reviewed:: Yes Do you feel safe going back to the place where you live?: Yes          Current home services: DME (2L O2 at baseline) Criminal Activity/Legal Involvement Pertinent to Current Situation/Hospitalization: No - Comment as needed  Activities of Daily Living      Permission Sought/Granted Permission sought to share information with : Facility Sport and exercise psychologist, Family Supports Permission granted to share information with : Yes, Verbal Permission Granted  Share Information with NAME: Darrold Junker [employer])   [phone] (Mobile)  Permission granted to share info w AGENCY: [place]        Emotional  Assessment Appearance:: Appears older than stated age Attitude/Demeanor/Rapport: Unable to Assess Affect (typically observed): Unable to Assess   Alcohol / Substance Use: Not Applicable Psych Involvement: No (comment)  Admission diagnosis:  CHF exacerbation ([place]) [I50.9] COPD with acute exacerbation ([place]) [J44.1] Patient Active Problem List   Diagnosis Date Noted   CHF exacerbation ([place]) 08/07/2021   COPD with acute exacerbation ([place]) 08/07/2021   Acute respiratory failure with hypoxia ([place]) 07/09/2021   CAP (community acquired pneumonia) 07/09/2021   Obstructive pneumonia 07/09/2021   Coronary artery disease involving native coronary artery of native heart without angina pectoris 07/19/2020   COPD, moderate ([place]) 07/19/2020   Personal history of lung cancer 07/19/2020   Community acquired pneumonia 06/27/2020   Acute CHF (congestive heart failure) ([place]) 06/26/2020   Leukocytosis 06/26/2020   Hypoxia 06/26/2020   Thoracic aortic aneurysm without rupture 08/16/2018   Kyphosis 08/07/2018   Hypertension 08/07/2018   Tobacco use 08/07/2018   Age-related osteoporosis without current pathological fracture 12/21/2016   Chronic hyponatremia 11/04/2015   Chronic low back pain 06/18/2015   Cardiomyopathy ([place]) 08/13/2014   Left bundle branch block (LBBB) 07/23/2014   Pedal edema 07/23/2014   SOB (shortness of breath) on exertion 07/23/2014   PCP:  [facility]:   [facility] [place] [facility] [place] [address] AT [place]  [address] [facility] [place] [address] Phone: [phone] Fax: [phone]     Social Determinants of Health (SDOH) Interventions    Readmission Risk Interventions No flowsheet data found.

## 2021-08-07 NOTE — ED Notes (Signed)
Patient resting quietly at this time.

## 2021-08-07 NOTE — ED Notes (Signed)
Patient declined tap water enema at this time.

## 2021-08-07 NOTE — ED Triage Notes (Addendum)
Patient arrived via EMS from local nsg facility [facility] health care) for increasing shortness of breath even on O2 at 2 liters per usual.  EMS interventions -- pulse ox up to 94% at O2 6 liters via nasal cannula, received duonebs x 2, solumedral 125 mg via iv, saline loc to left antecub via 20 g angiocath.

## 2021-08-08 ENCOUNTER — Inpatient Hospital Stay: Payer: Medicare Other

## 2021-08-08 ENCOUNTER — Encounter: Payer: Self-pay | Admitting: Family Medicine

## 2021-08-08 DIAGNOSIS — R918 Other nonspecific abnormal finding of lung field: Secondary | ICD-10-CM

## 2021-08-08 DIAGNOSIS — I719 Aortic aneurysm of unspecified site, without rupture: Secondary | ICD-10-CM

## 2021-08-08 DIAGNOSIS — J9621 Acute and chronic respiratory failure with hypoxia: Secondary | ICD-10-CM

## 2021-08-08 DIAGNOSIS — I4891 Unspecified atrial fibrillation: Secondary | ICD-10-CM

## 2021-08-08 DIAGNOSIS — N838 Other noninflammatory disorders of ovary, fallopian tube and broad ligament: Secondary | ICD-10-CM

## 2021-08-08 DIAGNOSIS — J90 Pleural effusion, not elsewhere classified: Secondary | ICD-10-CM

## 2021-08-08 DIAGNOSIS — I700 Atherosclerosis of aorta: Secondary | ICD-10-CM

## 2021-08-08 DIAGNOSIS — F17200 Nicotine dependence, unspecified, uncomplicated: Secondary | ICD-10-CM

## 2021-08-08 DIAGNOSIS — J439 Emphysema, unspecified: Secondary | ICD-10-CM

## 2021-08-08 DIAGNOSIS — I5032 Chronic diastolic (congestive) heart failure: Secondary | ICD-10-CM

## 2021-08-08 DIAGNOSIS — J9601 Acute respiratory failure with hypoxia: Secondary | ICD-10-CM | POA: Insufficient documentation

## 2021-08-08 DIAGNOSIS — I5033 Acute on chronic diastolic (congestive) heart failure: Secondary | ICD-10-CM

## 2021-08-08 DIAGNOSIS — J441 Chronic obstructive pulmonary disease with (acute) exacerbation: Secondary | ICD-10-CM | POA: Diagnosis not present

## 2021-08-08 DIAGNOSIS — F1010 Alcohol abuse, uncomplicated: Secondary | ICD-10-CM

## 2021-08-08 HISTORY — DX: Chronic diastolic (congestive) heart failure: I50.32

## 2021-08-08 HISTORY — DX: Atherosclerosis of aorta: I70.0

## 2021-08-08 HISTORY — DX: Emphysema, unspecified: J43.9

## 2021-08-08 HISTORY — DX: Aortic aneurysm of unspecified site, without rupture: I71.9

## 2021-08-08 HISTORY — DX: Other nonspecific abnormal finding of lung field: R91.8

## 2021-08-08 HISTORY — DX: Other noninflammatory disorders of ovary, fallopian tube and broad ligament: N83.8

## 2021-08-08 HISTORY — DX: Unspecified atrial fibrillation: I48.91

## 2021-08-08 LAB — BASIC METABOLIC PANEL
Anion gap: 10 (ref 5–15)
BUN: 18 mg/dL (ref 8–23)
CO2: 28 mmol/L (ref 22–32)
Calcium: 8.5 mg/dL — ABNORMAL LOW (ref 8.9–10.3)
Chloride: 91 mmol/L — ABNORMAL LOW (ref 98–111)
Creatinine, Ser: 0.75 mg/dL (ref 0.44–1.00)
GFR, Estimated: >60 mL/min (ref >60)
Glucose, Bld: 130 mg/dL — ABNORMAL HIGH (ref 70–99)
Potassium: 3.8 mmol/L (ref 3.5–5.1)
Sodium: 129 mmol/L — ABNORMAL LOW (ref 135–145)

## 2021-08-08 LAB — CBC
HCT: 34.6 % — ABNORMAL LOW (ref 36.0–46.0)
Hemoglobin: 12.3 g/dL (ref 12.0–15.0)
MCH: 33.2 pg (ref 26.0–34.0)
MCHC: 35.5 g/dL (ref 30.0–36.0)
MCV: 93.5 fL (ref 80.0–100.0)
Platelets: 261 10*3/uL (ref 150–400)
RBC: 3.7 MIL/uL — ABNORMAL LOW (ref 3.87–5.11)
RDW: 15.5 % (ref 11.5–15.5)
WBC: 5.4 10*3/uL (ref 4.0–10.5)
nRBC: 0 % (ref 0.0–0.2)

## 2021-08-08 LAB — BODY FLUID CELL COUNT WITH DIFFERENTIAL
Eos, Fluid: 0 %
Lymphs, Fluid: 74 %
Monocyte-Macrophage-Serous Fluid: 11 %
Neutrophil Count, Fluid: 15 %
Total Nucleated Cell Count, Fluid: 597 cu mm

## 2021-08-08 LAB — PROCALCITONIN: Procalcitonin: <0.1 ng/mL

## 2021-08-08 MED ORDER — THIAMINE HCL 100 MG PO TABS
100.0000 mg | ORAL_TABLET | Freq: Every day | ORAL | Status: DC
  Administered 2021-08-08 – 2021-08-11 (×4): 100 mg via ORAL
  Filled 2021-08-08 (×4): qty 1

## 2021-08-08 MED ORDER — LORAZEPAM 2 MG/ML IJ SOLN: 1.0000 mg | INTRAMUSCULAR | Status: AC | PRN

## 2021-08-08 MED ORDER — METHYLPREDNISOLONE SODIUM SUCC 125 MG IJ SOLR
80.0000 mg | Freq: Every day | INTRAMUSCULAR | Status: DC
  Administered 2021-08-08 – 2021-08-09 (×2): 80 mg via INTRAVENOUS
  Filled 2021-08-08 (×2): qty 2

## 2021-08-08 MED ORDER — THIAMINE HCL 100 MG/ML IJ SOLN
100.0000 mg | Freq: Every day | INTRAMUSCULAR | Status: DC
  Filled 2021-08-08: qty 2

## 2021-08-08 MED ORDER — FOLIC ACID 1 MG PO TABS
1.0000 mg | ORAL_TABLET | Freq: Every day | ORAL | Status: DC
  Administered 2021-08-09 – 2021-08-11 (×3): 1 mg via ORAL
  Filled 2021-08-08 (×3): qty 1

## 2021-08-08 MED ORDER — ADULT MULTIVITAMIN W/MINERALS CH
1.0000 | ORAL_TABLET | Freq: Every day | ORAL | Status: DC
  Administered 2021-08-09 – 2021-08-11 (×3): 1 via ORAL
  Filled 2021-08-08 (×3): qty 1

## 2021-08-08 MED ORDER — LORAZEPAM 1 MG PO TABS: 1.0000 mg | ORAL_TABLET | ORAL | Status: AC | PRN

## 2021-08-08 NOTE — Evaluation (Signed)
Occupational Therapy Evaluation Patient Details Name: Felicia Sellers MRN: [id number] DOB: Apr 04, 1941 Today's Date: 08/08/2021   History of Present Illness Felicia Sellers is a 80 y.o. female with history of lung cancer status post partial lung resection, CHF, COPD, CAD, hypertension who comes from [place] with EMS for concerns for shortness of breath and productive cough for 3 days.   Clinical Impression   Pt seen for OT evaluation this date. Pt reports being generally independent in ADL and RW, came to hospital from SNF. No family present to corroborate. Pt denies SOB currently but does then cough and shares that she briefly gets SOB after coughing spells. Pt reports becoming easily fatigued or out of breath with minimal exertion. Pt currently requires PRN MIN assist for LB ADL, SBA for ADL transfers + RW due to current functional impairments in strength, balance, activity tolerance, and cardiopulmonary status (See OT Problem List below). Pt on 6L O2 at start of session, lowered to 4L O2 and remained >90% on 4L during bed mobility, ADL transfer, and LB dressing. Pt educated in energy conservation strategies including pursed lip breathing. Pt verbalized understanding and would benefit from additional skilled OT services to maximize recall and carryover of learned techniques and facilitate implementation of learned techniques into daily routines. Upon discharge, recommend HHOT services and initial supervision/assist for ADL/mobility to minimize risk of falls, particularly if pt ends up going home with O2.     Recommendations for follow up therapy are one component of a multi-disciplinary discharge planning process, led by the attending physician.  Recommendations may be updated based on patient status, additional functional criteria and insurance authorization.   Follow Up Recommendations  Home health OT;Other (comment) (sup for ADL/mobility)    Equipment Recommendations  3 in 1 bedside  commode    Recommendations for Other Services       Precautions / Restrictions Precautions Precautions: Fall Restrictions Weight Bearing Restrictions: No      Mobility Bed Mobility Overal bed mobility: Needs Assistance Bed Mobility: Supine to Sit;Sit to Supine     Supine to sit: Supervision;HOB elevated Sit to supine: Supervision;HOB elevated        Transfers Overall transfer level: Needs assistance Equipment used: Rolling walker (2 wheeled) Transfers: Sit to/from Stand Sit to Stand: Supervision;Min guard         General transfer comment: Required VC's for hand placement.    Balance Overall balance assessment: Needs assistance Sitting-balance support: Feet unsupported;Bilateral upper extremity supported;No upper extremity supported;Single extremity supported Sitting balance-Leahy Scale: Fair Sitting balance - Comments: pt able to sit EOB with varying support, able to don socks with limited back support   Standing balance support: Bilateral upper extremity supported Standing balance-Leahy Scale: Fair                            ADL either performed or assessed with clinical judgement   ADL Overall ADL's : Needs assistance/impaired                                       General ADL Comments: Pt able to doff/don socks while seated EOB using figure 4 technique, requiring intermittent back support on elevated HOB; SBA-CGA for ADL transfers w/ RW.     Vision Baseline Vision/History: 1 Wears glasses Additional Comments: glasses for reading     Perception     Praxis  Pertinent Vitals/Pain Pain Assessment: No/denies pain     Hand Dominance Right   Extremity/Trunk Assessment Upper Extremity Assessment Upper Extremity Assessment: Generalized weakness   Lower Extremity Assessment Lower Extremity Assessment: Generalized weakness   Cervical / Trunk Assessment Cervical / Trunk Assessment: Normal   Communication  Communication Communication: HOH   Cognition Arousal/Alertness: Awake/alert Behavior During Therapy: WFL for tasks assessed/performed Overall Cognitive Status: Within Functional Limits for tasks assessed                                 General Comments: A&O x4   General Comments  SpO2 on 6L at start, dropped to 4L O2, remained >90% on 4L throughout session with ADL and bed mobility. Left on 4L O2, RN notified. LUE IV noted to be bleeding through bandage a little, RN notified.    Exercises Other Exercises Other Exercises: Pt edu in role of OT, ECS, PLB   Shoulder Instructions      Home Living Family/patient expects to be discharged to:: Private residence Living Arrangements: Other relatives (nephew) Available Help at Discharge: Family;Available PRN/intermittently;Neighbor (nephew works during day. Has neighbor that can assist PRN.) Type of Home: House Home Access: Stairs to enter [employer] of Steps: 5 Entrance Stairs-Rails: Can reach both Home Layout: One level     Bathroom Shower/Tub: Teacher, early years/pre: Standard Bathroom Accessibility: Yes   Home Equipment: Environmental consultant - 2 wheels;Cane - single point   Additional Comments: Reports doesn't use AD      Prior Functioning/Environment Level of Independence: Needs assistance  Gait / Transfers Assistance Needed: Reports amb with RW at SNF ADL's / Homemaking Assistance Needed: From SNF prior to current hospitalization, pt endorses able to dress herself            OT Problem List: Decreased strength;Cardiopulmonary status limiting activity;Impaired balance (sitting and/or standing)      OT Treatment/Interventions: Self-care/ADL training;Therapeutic exercise;Therapeutic activities;Energy conservation;DME and/or AE instruction;Balance training;Patient/family education    OT Goals(Current goals can be found in the care plan section) Acute Rehab OT Goals Patient Stated Goal: to go  home OT Goal Formulation: With patient Time For Goal Achievement: 08/22/21 Potential to Achieve Goals: Good ADL Goals Pt Will Transfer to Toilet: with modified independence;ambulating;bedside commode (LRAD for amb) Additional ADL Goal #1: Pt will perform UB and LB dressing with modified independence. Additional ADL Goal #2: Pt will perform UB/LB bathing with supervision for safety. Additional ADL Goal #3: Pt will utilize learned ECS during ADL tasks, 5/5 opportunities with PRN VC to initiate.  OT Frequency: Min 2X/week   Barriers to D/C:            Co-evaluation              AM-PAC OT "6 Clicks" Daily Activity     Outcome Measure Help from another person eating meals?: None Help from another person taking care of personal grooming?: None Help from another person toileting, which includes using toliet, bedpan, or urinal?: A Little Help from another person bathing (including washing, rinsing, drying)?: A Little Help from another person to put on and taking off regular upper body clothing?: A Little Help from another person to put on and taking off regular lower body clothing?: A Little 6 Click Score: 20   End of Session Nurse Communication: Other (comment) (O2, IV)  Activity Tolerance: Patient tolerated treatment well Patient left: in bed;with call bell/phone within reach;with nursing/sitter  in room  OT Visit Diagnosis: Other abnormalities of gait and mobility (R26.89);Muscle weakness (generalized) (M62.81)                Time: 4967-5916 OT Time Calculation (min): 26 min Charges:  OT General Charges $OT Visit: 1 Visit OT Evaluation $OT Eval Moderate Complexity: 1 Mod OT Treatments $Self Care/Home Management : 8-22 mins  Ardeth Perfect., MPH, MS, OTR/L ascom [phone] 08/08/21, 4:13 PM

## 2021-08-08 NOTE — Progress Notes (Signed)
PT Cancellation Note  Patient Details Name: Felicia Sellers MRN: [id number] DOB: [birth year]   Cancelled Treatment:    Reason Eval/Treat Not Completed: Medical issues which prohibited therapy. Orders received and chart reviewed. Noted SPO2 at 76% this a.m. Per RN pt had SOB episode and is requiring time on BiPap prior to PT intervention. Will hold and re-assess in the afternoon and intervene when medically appropriate.   Salem Caster. Fairly IV, PT, DPT Physical Therapist- [facility]  08/08/2021, 8:55 AM

## 2021-08-08 NOTE — Consult Note (Signed)
Pulmonary Medicine          Date: 08/08/2021,   MRN# [id number] Felicia Sellers 01/27/1941     AdmissionWeight: 53.5 kg                 CurrentWeight: 53.5 kg  Refferring physician: Dr Sarajane Jews    CHIEF COMPLAINT:   Acute on chronic hypoxemic respiratory failure   HISTORY OF PRESENT ILLNESS   This is a 80 yo F with hx of Aortic aneurysm, Right lung cancer s/p resection , CHF, chronic hyponatremia, COPD with chronic hypoxemia on 3L/min [place] supplemental O2 at home. She has hx of gastric ulceration, essential HTN, and ovarian mass, who came in after acute on sent SOB and was admitted with COPD exacerbation. She was noted to be hypoxemic in the 70s.  She had chest imaging done and was noted to have large left sided pleural effusion. This was drained and fluid was cloudy straw colored appx 700cc.  I was present during procedure and there were no complications noted. Fluid profile thus far consistent with lymphocyte predominant transudate. PCCM consultation for further evaluation and management.    PAST MEDICAL HISTORY   Past Medical History:  Diagnosis Date   Aortic aneurysm ([place]) 08/08/2021   Aortic atherosclerosis ([name]) 08/08/2021   Cancer ([place])    right lung removed - lung cancer 1/3 lung removed    CHF (congestive heart failure) (HCC)    Chronic diastolic CHF (congestive heart failure) ([place]) 08/08/2021   Chronic hyponatremia 11/04/2015   Overview:  Normal serum oslmolality   COPD (chronic obstructive pulmonary disease) (HCC)    Coronary artery disease    Emphysema ([place]) 08/08/2021   History of stomach ulcers    Hypertension    Lung mass 08/08/2021   Ovarian mass 08/08/2021   Pneumonia    Unspecified atrial fibrillation ([place]) 08/08/2021     SURGICAL HISTORY   Past Surgical History:  Procedure Laterality Date   LUNG REMOVAL, PARTIAL Left      FAMILY HISTORY   Family History  Problem Relation Age of Onset   Breast cancer Neg Hx      SOCIAL HISTORY    Social History   Tobacco Use   Smoking status: Former    Packs/day: 1.00    Years: 66.00    Pack years: 66.00    Types: Cigarettes    Quit date: 06/26/2020    Years since quitting: 1.1   Smokeless tobacco: Never  Vaping Use   Vaping Use: Never used  Substance Use Topics   Alcohol use: Not Currently    Comment: use to drink alot ( vodka) 6 months ago   Drug use: Never     MEDICATIONS    Home Medication:  Current Outpatient Rx   Order #: [id number] Class: Normal   Order #: [id number] Class: Historical Med   Order #: [id number] Class: Historical Med   Order #: [id number] Class: Historical Med   Order #: [id number] Class: Historical Med   Order #: [id number] Class: Historical Med   Order #: [id number] Class: Historical Med   Order #: [id number] Class: Historical Med   Order #: [id number] Class: Historical Med   Order #: [id number] Class: Historical Med   Order #: [id number] Class: Historical Med   Order #: [id number] Class: Historical Med   Order #: [id number] Class: Historical Med    Current Medication:  Current Facility-Administered Medications:    acetaminophen (TYLENOL) tablet 650 mg, 650 mg, Oral, Q6H PRN **OR** acetaminophen (TYLENOL) suppository 650 mg, 650 mg, Rectal, Q6H PRN, Karmen Bongo,  MD   albuterol (PROVENTIL) (2.5 MG/3ML) 0.083% nebulizer solution 2.5 mg, 2.5 mg, Nebulization, Q2H PRN, Karmen Bongo, MD, 2.5 mg at 08/08/21 0810   atorvastatin (LIPITOR) tablet 40 mg, 40 mg, Oral, QHS, Karmen Bongo, MD, 40 mg at 08/07/21 2252   bisacodyl (DULCOLAX) EC tablet 5 mg, 5 mg, Oral, Daily PRN, Karmen Bongo, MD   ceFEPIme (MAXIPIME) 2 g in sodium chloride 0.9 % 100 mL IVPB, 2 g, Intravenous, Q12H, Karmen Bongo, MD, Stopped at 08/08/21 1232   docusate sodium (COLACE) capsule 100 mg, 100 mg, Oral, BID, Karmen Bongo, MD, 100 mg at 08/08/21 1026   enoxaparin (LOVENOX) injection 40 mg, 40 mg, Subcutaneous, Q24H, Karmen Bongo, MD, 40 mg at 16/10/96 0454   folic acid (FOLVITE)  tablet 1 mg, 1 mg, Oral, Daily, Samuella Cota, MD   gabapentin (NEURONTIN) capsule 300 mg, 300 mg, Oral, TID, Karmen Bongo, MD, 300 mg at 08/08/21 1026   guaiFENesin (MUCINEX) 12 hr tablet 600 mg, 600 mg, Oral, BID PRN, Karmen Bongo, MD, 600 mg at 08/07/21 1429   hydrALAZINE (APRESOLINE) injection 5 mg, 5 mg, Intravenous, Q4H PRN, Karmen Bongo, MD   HYDROcodone-acetaminophen (NORCO/VICODIN) 5-325 MG per tablet 1-2 tablet, 1-2 tablet, Oral, Q4H PRN, Karmen Bongo, MD   ipratropium-albuterol (DUONEB) 0.5-2.5 (3) MG/3ML nebulizer solution 3 mL, 3 mL, Nebulization, Q6H, Karmen Bongo, MD, 3 mL at 08/08/21 0809   LORazepam (ATIVAN) tablet 1-4 mg, 1-4 mg, Oral, Q1H PRN **OR** LORazepam (ATIVAN) injection 1-4 mg, 1-4 mg, Intravenous, Q1H PRN, Samuella Cota, MD   methylPREDNISolone sodium succinate (SOLU-MEDROL) 125 mg/2 mL injection 80 mg, 80 mg, Intravenous, Daily, Samuella Cota, MD, 80 mg at 08/08/21 1023   metoprolol succinate (TOPROL-XL) 24 hr tablet 25 mg, 25 mg, Oral, Daily, Karmen Bongo, MD, 25 mg at 08/08/21 1026   morphine 2 MG/ML injection 2 mg, 2 mg, Intravenous, Q2H PRN, Karmen Bongo, MD   multivitamin with minerals tablet 1 tablet, 1 tablet, Oral, Daily, Samuella Cota, MD   nicotine (NICODERM CQ - dosed in mg/24 hours) patch 14 mg, 14 mg, Transdermal, Daily, Karmen Bongo, MD, 14 mg at 08/08/21 1030   ondansetron (ZOFRAN) tablet 4 mg, 4 mg, Oral, Q6H PRN **OR** ondansetron (ZOFRAN) injection 4 mg, 4 mg, Intravenous, Q6H PRN, Karmen Bongo, MD   polyethylene glycol (MIRALAX / GLYCOLAX) packet 17 g, 17 g, Oral, Daily PRN, Karmen Bongo, MD   sodium chloride flush (NS) 0.9 % injection 3 mL, 3 mL, Intravenous, Q12H, Karmen Bongo, MD, 3 mL at 08/08/21 0948   thiamine tablet 100 mg, 100 mg, Oral, Daily **OR** thiamine (B-1) injection 100 mg, 100 mg, Intravenous, Daily, Samuella Cota, MD   zolpidem Lorrin Mais) tablet 5 mg, 5 mg, Oral, QHS PRN, Karmen Bongo, MD, 5 mg at 08/07/21 2306  Current Outpatient Medications:    Albuterol Sulfate (PROAIR RESPICLICK) 098 (90 Base) MCG/ACT AEPB, Inhale 2 puffs into the lungs every 6 (six) hours as needed., Disp: 1 each, Rfl: 11   amLODipine (NORVASC) 10 MG tablet, Take 10 mg by mouth daily., Disp: , Rfl:    atorvastatin (LIPITOR) 40 MG tablet, Take 40 mg by mouth at bedtime., Disp: , Rfl:    calcium carbonate (OS-CAL - DOSED IN MG OF ELEMENTAL CALCIUM) 1250 (500 Ca) MG tablet, Take 1 tablet by mouth 2 (two) times daily with a meal. , Disp: , Rfl:    Cholecalciferol 1000 units capsule, Take 1,000 Units by mouth daily., Disp: , Rfl:  diclofenac Sodium (VOLTAREN) 1 % GEL, Apply topically 2 (two) times daily. Apply to upper back and shoulders every morning and at bedtime related to muscle weakness., Disp: , Rfl:    docusate sodium (COLACE) 100 MG capsule, Take 100 mg by mouth daily., Disp: , Rfl:    gabapentin (NEURONTIN) 300 MG capsule, Take 300 mg by mouth 3 (three) times daily., Disp: , Rfl:    ipratropium (ATROVENT) 0.02 % nebulizer solution, Take 0.5 mg by nebulization every 12 (twelve) hours as needed for wheezing or shortness of breath., Disp: , Rfl:    lisinopril (ZESTRIL) 10 MG tablet, Take 10 mg by mouth daily. , Disp: , Rfl:    loperamide (IMODIUM) 2 MG capsule, Take 4 mg by mouth as needed for diarrhea or loose stools., Disp: , Rfl:    metoprolol succinate (TOPROL-XL) 25 MG 24 hr tablet, Take 25 mg by mouth daily., Disp: , Rfl:    sodium chloride 1 g tablet, Take 2 g by mouth 3 (three) times daily., Disp: , Rfl:     ALLERGIES   Aspirin     REVIEW OF SYSTEMS    Review of Systems:  Gen:  Denies  fever, sweats, chills weigh loss  HEENT: Denies blurred vision, double vision, ear pain, eye pain, hearing loss, nose bleeds, sore throat Cardiac:  No dizziness, chest pain or heaviness, chest tightness,edema Resp:   Denies cough or sputum porduction, shortness of breath,wheezing,  hemoptysis,  Gi: Denies swallowing difficulty, stomach pain, nausea or vomiting, diarrhea, constipation, bowel incontinence Gu:  Denies bladder incontinence, burning urine Ext:   Denies Joint pain, stiffness or swelling Skin: Denies  skin rash, easy bruising or bleeding or hives Endoc:  Denies polyuria, polydipsia , polyphagia or weight change Psych:   Denies depression, insomnia or hallucinations   Other:  All other systems negative   VS: BP 110/71   Pulse 97   Temp 98.3 F (36.8 C) (Rectal)   Resp 17   Ht 5\' 3"  (1.6 m)   Wt 53.5 kg   SpO2 100%   BMI 20.90 kg/m      PHYSICAL EXAM    GENERAL:NAD, no fevers, chills, no weakness no fatigue HEAD: Normocephalic, atraumatic.  EYES: Pupils equal, round, reactive to light. Extraocular muscles intact. No scleral icterus.  MOUTH: Moist mucosal membrane. Dentition intact. No abscess noted.  EAR, NOSE, THROAT: Clear without exudates. No external lesions.  NECK: Supple. No thyromegaly. No nodules. No JVD.  PULMONARY: Decreased air entry b/l worse on left lung  CARDIOVASCULAR: S1 and S2. Regular rate and rhythm. No murmurs, rubs, or gallops. No edema. Pedal pulses 2+ bilaterally.  GASTROINTESTINAL: Soft, nontender, nondistended. No masses. Positive bowel sounds. No hepatosplenomegaly.  MUSCULOSKELETAL: No swelling, clubbing, or edema. Range of motion full in all extremities.  NEUROLOGIC: Cranial nerves II through XII are intact. No gross focal neurological deficits. Sensation intact. Reflexes intact.  SKIN: No ulceration, lesions, rashes, or cyanosis. Skin warm and dry. Turgor intact.  PSYCHIATRIC: Mood, affect within normal limits. The patient is awake, alert and oriented x 3. Insight, judgment intact.       IMAGING      CT CHEST W CONTRAST  Result Date: 08/07/2021 CLINICAL DATA:  COPD exacerbation EXAM: CT CHEST WITH CONTRAST TECHNIQUE: Multidetector CT imaging of the chest was performed during intravenous contrast  administration. CONTRAST:  74mL OMNIPAQUE IOHEXOL 300 MG/ML  SOLN COMPARISON:  07/09/2021 FINDINGS: Cardiovascular: Cardiomegaly. No pericardial effusion. Advanced atherosclerosis of the aorta with irregular plaque diffusely  and the descending aneurysm which measures 4.9 Cm in diameter. No pulmonary artery filling defect. Mediastinum/Nodes: No adenopathy mass. Mild food retention of the esophagus. Lungs/Pleura: Moderate to large left pleural effusion which is simple density with extensive atelectasis. Significant clearing right-sided pneumonia since prior CT. Postoperative right upper lobe. Improved nodularity on prior which is also attributed to infection. Emphysema. Upper Abdomen: Extensive atherosclerosis. Hepatic venous reflux attributed to right heart failure Musculoskeletal: Exaggerated kyphosis.  No acute finding IMPRESSION: 1. Moderate to large simple left pleural effusion with multi segment atelectasis. 2. Essentially resolved pneumonia seen on prior CT. 3. Advanced atherosclerosis especially of the aorta with descending aneurysm measuring up to 4.9 cm in diameter. Follow-up depends on true morbidity/life expectancy. Aortic Atherosclerosis (ICD10-I70.0) and Emphysema (ICD10-J43.9). Electronically Signed   By: Jorje Guild M.D.   On: 08/07/2021 10:22   MR BRAIN W WO CONTRAST  Result Date: 07/10/2021 CLINICAL DATA:  Hematologic malignancy, staging. EXAM: MRI HEAD WITHOUT AND WITH CONTRAST TECHNIQUE: Multiplanar, multiecho pulse sequences of the brain and surrounding structures were obtained without and with intravenous contrast. CONTRAST:  46mL GADAVIST GADOBUTROL 1 MMOL/ML IV SOLN COMPARISON:  Head CT 05/28/2014 FINDINGS: Brain: No enhancement or swelling to suggest metastatic disease. No acute infarction, hemorrhage, hydrocephalus, extra-axial collection or mass lesion. Chronic small vessel ischemia that is extensive in the hemispheric white matter and mild in the pons. Small remote bilateral  cerebellar infarcts. Vascular: Normal flow voids. Skull and upper cervical spine: Normal marrow signal. Sinuses/Orbits: Negative. IMPRESSION: 1. No acute finding or evidence of metastatic disease. 2. Extensive chronic small vessel ischemia. Electronically Signed   By: Jorje Guild M.D.   On: 07/10/2021 11:39   NM PET Image Initial (PI) Skull Base To Thigh  Result Date: 07/26/2021 CLINICAL DATA:  Initial treatment strategy for right lung mass. EXAM: NUCLEAR MEDICINE PET SKULL BASE TO THIGH TECHNIQUE: 6.1 mCi F-18 FDG was injected intravenously. Full-ring PET imaging was performed from the skull base to thigh after the radiotracer. CT data was obtained and used for attenuation correction and anatomic localization. Fasting blood glucose: 78 mg/dl COMPARISON:  CT chest 07/09/2021 FINDINGS: Mediastinal blood pool activity: SUV max 1.9 Liver activity: SUV max NA NECK: No significant abnormal hypermetabolic activity in this region. Incidental CT findings: Bilateral common carotid atherosclerotic calcifications. CHEST: Substantial clearing of prior airspace opacity with mild residual peripheral nodularity in the remaining right lung, particularly peripherally in the right lower lobe, which warrants surveillance but is currently not of high suspicion for active malignancy. This clustered peripheral nodularity has a maximum SUV up to 1.9. No central obstructing hypermetabolic lesion is identified. Right lower paratracheal lymph node 1.2 cm in short axis with maximum SUV 2.4, previous maximum SUV 3.5. Incidental CT findings: Centrilobular emphysema. Coronary, aortic arch, and branch vessel atherosclerotic vascular disease. 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 Aneurysm of the descending thoracic aorta 5.1 cm in diameter. Moderate cardiomegaly. Dense mitral valve calcification. Trace pericardial effusion posteriorly. Small bilateral pleural effusions with associated passive atelectasis. Postoperative findings  in the right lung. There is also some mild peripheral scattered nodularity in the left lung which is moderately improved compared to 07/09/2021. ABDOMEN/PELVIS: Complex cystic lesion in the left adnexa measuring 11.3 by 6.3 cm on image 191 series 4 with several loculations of varying density, but photopenic on the PET data. Smaller photopenic lesion of the right ovary. Incidental CT findings: Gallbladder wall thickening, nonspecific although inflammation is not readily excluded. Extensive atherosclerosis. Sigmoid colon diverticulosis. Diffuse  subcutaneous edema. SKELETON: No significant abnormal hypermetabolic activity in this region. Incidental CT findings: Degenerative hip arthropathy, right greater than left. IMPRESSION: 1. Interval clearance of the vast majority of the airspace opacity in the right lung. There some small peripheral areas of scattered nodularity which merit chest CT surveillance but which are not substantially hypermetabolic today, and accordingly may potentially be inflammatory/infectious. Postoperative findings in the right lung. 2. Stable mild paratracheal adenopathy, although SUV has decreased compared to the prior chest CT of 08/22/2018. 3. Complex cystic lesion of the left adnexa measuring up to 11.3 cm in long axis, photopenic. Smaller lesion of the right ovary is not substantially hypermetabolic either. 4. 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 Recommend semi-annual imaging followup by CTA or MRA and referral to cardiothoracic surgery if not already obtained. This recommendation follows 2010 ACCF/AHA/AATS/ACR/ASA/SCA/SCAI/SIR/STS/SVM Guidelines for the Diagnosis and Management of Patients With Thoracic Aortic Disease. Circulation. 2010; 121: T614-E31. Aortic aneurysm NOS (ICD10-I71.9) 5. Other imaging findings of potential clinical significance: Aortic Atherosclerosis (ICD10-I70.0) and Emphysema (ICD10-J43.9). Coronary atherosclerosis and  extensive abdominal atherosclerosis. Moderate cardiomegaly. Dense mitral valve calcifications. Small bilateral pleural effusions along with subcutaneous edema suggesting mild third spacing of fluid. Nonspecific wall thickening in the gallbladder. Sigmoid colon diverticulosis. Degenerative hip arthropathy. Electronically Signed   By: Van Clines M.D.   On: 07/26/2021 07:13   DG Chest Port 1 View  Result Date: 08/08/2021 CLINICAL DATA:  Status post thoracentesis on the left side. EXAM: PORTABLE CHEST 1 VIEW COMPARISON:  08/07/2021 FINDINGS: Improved aeration in the left lower chest. Residual densities at the left lung base. Prominent interstitial lung markings bilaterally and minimally changed. Blunting of the right costophrenic angle is suggestive for right pleural fluid. Negative for pneumothorax. Heart size remains enlarged. IMPRESSION: 1. Negative for a pneumothorax following the left thoracentesis. 2. Improved aeration in the left lower lung with residual densities at the left lung base. 3. Evidence for right pleural effusion. Electronically Signed   By: Markus Daft M.D.   On: 08/08/2021 10:38   DG Chest Portable 1 View  Result Date: 08/07/2021 CLINICAL DATA:  Increasing shortness of breath EXAM: PORTABLE CHEST 1 VIEW COMPARISON:  07/08/2021 FINDINGS: Improved right lung aeration. Worse left base aeration at least partially from pleural fluid. Small right pleural effusion. Generalized reticulonodular opacity. Chronic cardiomegaly. IMPRESSION: 1. Left more than right pleural effusion with obscured or opacified left lower lobe. 2. Significant clearance of right-sided pneumonia since chest x-ray last month. 3. Background chronic lung disease and nodularity reference recent PET. Electronically Signed   By: Jorje Guild M.D.   On: 08/07/2021 05:04   US THORACENTESIS ASP PLEURAL SPACE W/IMG GUIDE  Result Date: 08/08/2021 INDICATION: Patient with shortness of breath and large left pleural  effusion request received for thoracentesis. EXAM: ULTRASOUND GUIDED DIAGNOSTIC AND THERAPEUTIC THORACENTESIS MEDICATIONS: Local 1% lidocaine only. COMPLICATIONS: None immediate. PROCEDURE: An ultrasound guided thoracentesis was thoroughly discussed with the patient and questions answered. The benefits, risks, alternatives and complications were also discussed. The patient understands and wishes to proceed with the procedure. Written consent was obtained. Ultrasound was performed to localize and mark an adequate pocket of fluid in the left chest. The area was then prepped and draped in the normal sterile fashion. 1% Lidocaine was used for local anesthesia. Under ultrasound guidance a 6 Fr Safe-T-Centesis catheter was introduced. Thoracentesis was performed. The catheter was removed and a dressing applied. FINDINGS: A total of approximately 700 mL of  clear yellow fluid was removed. Samples were sent to the laboratory as requested by the clinical team. IMPRESSION: Successful ultrasound guided left thoracentesis yielding 700 mL of pleural fluid. Read By: Tsosie Billing PA-C Electronically Signed   By: Ruthann Cancer M.D.   On: 08/08/2021 10:47      ASSESSMENT/PLAN   Acute on chronic hypoxemic respiratory failure   - currently seems to have complete atelectasis of LLL due to large left pleural effusion    - recommend incentive spirometry and flutter valve -agree with empiric tx for CAP and COPD - agree with cefepime -agree with solumedrol - currently at dose of 80mg  would reduce to 40mg  -poor prognosis in DNR patient would recommend palliative evaluation  Large left pleural effusion   - s/p aspiration of 700cc cloudly straw colored lymphocyte predominant transudate   - this is likely due to CKD/CHF and is likely the etiology    - due to hx of cancer would evaluate cytology      Thank you for allowing me to participate in the care of this patient.  Total face to face encounter time for this patient  visit was >45 min. >50% of the time was  spent in counseling and coordination of care.   Patient/Family are satisfied with care plan and all questions have been answered.  This document was prepared using Dragon voice recognition software and may include unintentional dictation errors.     Ottie Glazier, M.D.  Division of [place]

## 2021-08-08 NOTE — Progress Notes (Signed)
OT Cancellation Note  Patient Details Name: Felicia Sellers MRN: [id number] DOB: 1941-06-26   Cancelled Treatment:    Reason Eval/Treat Not Completed: Patient not medically ready. Consult received, chart reviewed. Pt noted with increased SOB this morning. Now pending stat thoracentesis. Will hold OT evaluation and re-attempt at later date/time as pt is more medically appropriate for evaluation of ADL and exertional activity.   Ardeth Perfect., MPH, MS, OTR/L ascom [phone] 08/08/21, 8:23 AM

## 2021-08-08 NOTE — Assessment & Plan Note (Signed)
Recommend cessation. ?

## 2021-08-08 NOTE — Assessment & Plan Note (Signed)
--   On the right side, status post lobectomy per chart

## 2021-08-08 NOTE — Assessment & Plan Note (Addendum)
--   Mild pedal edema but no evidence of pulmonary edema or decompensation, not on a diuretic at home, monitor volume status daily.  Continue Toprol-XL.  Appears stable.

## 2021-08-08 NOTE — Assessment & Plan Note (Addendum)
--   New diagnosis last admission Per discharge summary, TSH was within normal limits.  TTE was without significant abnormality.  Rate controlled.  Continue beta-blocker.  Anticoagulation was not pursued based on suspicion for need of bronchoscopy -- Prophylactic Lovenox for now.  Discuss risk/benefit of anticoagulation as an outpatient.

## 2021-08-08 NOTE — ED Notes (Signed)
RN to bedside to answer call bell. Pt states "I cant breath\E". RN admin neb that is due. Pt O2 sat was 78% on [place] at 2lpm. After neb completed placed on NRB. Had ED MD come to bedside. Msg attending MD as well.

## 2021-08-08 NOTE — Assessment & Plan Note (Signed)
--   Treatment as per COPD

## 2021-08-08 NOTE — ED Notes (Signed)
Pt now on BiPap and better.

## 2021-08-08 NOTE — TOC Progression Note (Addendum)
Transition of Care [facility]) - Progression Note    Patient Details  Name: Felicia Sellers MRN: [id number] Date of Birth: [birth year]  Transition of [facility]) CM/SW [place], [place] Phone Number: [phone] 08/08/2021, 2:47 PM  Clinical Narrative:     CSW spoke with patient. Patient refuses to return to [facility].  Patient would prefer to return home with home health and O2. Patient stated she has support at home and people who will care for her.  CSW contacted [place] requested assessment for home health services.  Felicia Sellers confirmed request.  Patient is currently on 6L at 95%.  ED RN contact Attending about possible d/c.  Attending stated the patient is not medically stable for d/c and will remain inpatient for the next day or two.  Pulmonology was consulted and has recommended a palliative care consult. Palliative Consult initiated.  CSW updated patient's main contact and next of kin Felicia Sellers [facility]) [phone], on patient disposition.  Felicia Sellers requested an update when the patient was ready to discharge to ensure he was at the house when she arrives.   Expected Discharge Plan: [place] Barriers to Discharge: Continued Medical Work up  Expected Discharge Plan and Services Expected Discharge Plan: [place] In-house Referral: Clinical Social Work   Post Acute Care Choice: [place] Living arrangements for the past 2 months: [place])                                       Social Determinants of Health (SDOH) Interventions    Readmission Risk Interventions No flowsheet data found.

## 2021-08-08 NOTE — ED Notes (Signed)
RN to bedside to introduce self to pt. Pt sleeping.  

## 2021-08-08 NOTE — Assessment & Plan Note (Signed)
--  followed by Dr. Fransisca Connors

## 2021-08-08 NOTE — Assessment & Plan Note (Addendum)
--   Secondary to severe COPD exacerbation complicated by large left pleural effusion, underlying emphysema -- Much improved after thoracentesis 10/17.  Off BiPAP.  Currently on 4 L nasal cannula which is new, patient reports she has not used oxygen at home. --Arrange home oxygen, anticipate discharge next 1 to 2 days based on clinical improvement.

## 2021-08-08 NOTE — Assessment & Plan Note (Signed)
--   Appears stable, likely secondary to lung disease

## 2021-08-08 NOTE — ED Notes (Signed)
Pt sleeping fowlers in bed with [place] applied. Pt arousable, a/ox4, has audible rhonchi. Pt states she has cough, SOB. +pneumonia DX. LS rhonchi throughout

## 2021-08-08 NOTE — Assessment & Plan Note (Addendum)
--   Reportedly in chart, not clear whether currently drinking, monitor, CIWA.  No signs or symptoms of withdrawal.

## 2021-08-08 NOTE — Evaluation (Addendum)
Physical Therapy Evaluation Patient Details Name: Felicia Sellers MRN: [id number] DOB: Sep 12, 1941 Today's Date: 08/08/2021  History of Present Illness  Felicia Sellers is a 80 y.o. female with history of lung cancer status post partial lung resection, CHF, COPD, CAD, hypertension who comes from [facility] with EMS for concerns for shortness of breath and productive cough for 3 days.   Clinical Impression  Pt admitted with above diagnosis. Pt received sleeping in bed resting on 6L/min via [place]. Easily aroused to light touch on arm and to name. A&Ox4 and able to report home lay out, PLOF, DME, and assistance at home she has access to without difficulty. Reports prior to admission pt was at [employer] and using RW for mobility. Prior to SNF, pt from home and indep with no AD with household ambulation and ADL's/IADL's with help from her nephew that lives with her only in evenings as he works during the day. CSW arrives in room during PT eval with conversation held on care and pt's plans/ goals of care once medically stable to D/c. Reporting poor experience from SNF and that she wishes to return home after her current admission. PT able to titrate pt to 3L/min and maintain SPO2 at rest at 94%. Supervision for pt to transfer to EoB and with standing to RW, pt does desat to mid 80's on 3 L/min. Does require cuing for safe hand placement but able to stand with supervision overall. Ultimately relied on O2 titration to 8L to return to> 90% with standing and amb. Pt displays safe amb with RW with no LOB or sway noted just decreased speed and reporting LE weakness worse from her baseline. HR trending upper 90's to low 100's up to 106 BPM during amb and SPO2 > 90%. Tolerated SPO2 at 94% with remaining 10' of ambulation back to bed. Able to return to supine in bed with supervision and return to 6L/min with Spo2 >90%. RN notified and CSW of O2 sats during evaluation. Please see additional oxygen note in pt's  chart. Overall pt presents with deficits in LE endurance and strength with ambulation and will benefit from [facility] PT once returning to home environment to return to optimal level of function with ADL's/IADL's. Pt currently with functional limitations due to the deficits listed below (see PT Problem List). Pt will benefit from skilled PT to increase their independence and safety with mobility to allow discharge to the venue listed below.       Recommendations for follow up therapy are one component of a multi-disciplinary discharge planning process, led by the attending physician.  Recommendations may be updated based on patient status, additional functional criteria and insurance authorization.  Follow Up Recommendations Home health PT;Supervision for mobility/OOB    Equipment Recommendations  None recommended by PT    Recommendations for Other Services       Precautions / Restrictions Precautions Precautions: Fall Restrictions Weight Bearing Restrictions: No      Mobility  Bed Mobility Overal bed mobility: Needs Assistance Bed Mobility: Supine to Sit;Sit to Supine     Supine to sit: Supervision;HOB elevated Sit to supine: Supervision;HOB elevated     Patient Response: Cooperative  Transfers Overall transfer level: Needs assistance Equipment used: Rolling walker (2 wheeled) Transfers: Sit to/from Stand Sit to Stand: Supervision         General transfer comment: Required VC's for hand placement.  Ambulation/Gait Ambulation/Gait assistance: Supervision Gait Distance (Feet): 25 Feet Assistive device: Rolling walker (2 wheeled) Gait Pattern/deviations: Step-through pattern;Trunk flexed  Gait velocity: decreased   General Gait Details: Stable throughout with no LOB or sway noted. Endorses LE weakness while walking.  Stairs            Wheelchair Mobility    Modified Rankin (Stroke Patients Only)       Balance Overall balance assessment: Needs  assistance Sitting-balance support: Feet unsupported;Bilateral upper extremity supported Sitting balance-Leahy Scale: Fair       Standing balance-Leahy Scale: Fair Standing balance comment: Minor need of RW for steadiness. More necessary for LE weakness pt is endorsing moreso than balance.                             Pertinent Vitals/Pain Pain Assessment: No/denies pain    Home Living Family/patient expects to be discharged to:: Private residence Living Arrangements: Other relatives (nephew) Available Help at Discharge: Family;Available PRN/intermittently;Neighbor (nephew works during day. Has neighbor that can assist PRN.) Type of Home: House Home Access: Stairs to enter Entrance Stairs-Rails: Can reach both Entrance Stairs-Number of Steps: 5 Home Layout: One level Home Equipment: Walker - 2 wheels;Cane - single point Additional Comments: Reports doesn't use AD    Prior Function Level of Independence: Needs assistance   Gait / Transfers Assistance Needed: Reports amb with RW at SNF  ADL's / Homemaking Assistance Needed: From SNF prior to current hospitalization        Hand Dominance   Dominant Hand: Right    Extremity/Trunk Assessment   Upper Extremity Assessment Upper Extremity Assessment: Defer to OT evaluation    Lower Extremity Assessment Lower Extremity Assessment: Generalized weakness    Cervical / Trunk Assessment Cervical / Trunk Assessment: Normal  Communication   Communication: HOH  Cognition Arousal/Alertness: Awake/alert Behavior During Therapy: WFL for tasks assessed/performed Overall Cognitive Status: Within Functional Limits for tasks assessed                                 General Comments: A&O x4      General Comments General comments (skin integrity, edema, etc.): SPO2 on 3 L at 94%. Required 8 L O2 with ambulation to stay > 90%. Tolerated titration to 6L last 10' of amb with SPO2 at 91%.    Exercises Other  Exercises Other Exercises: Role of PT in acute setting, D/c recs, PLB   Assessment/Plan    PT Assessment Patient needs continued PT services  PT Problem List Decreased strength;Decreased activity tolerance;Cardiopulmonary status limiting activity       PT Treatment Interventions DME instruction;Therapeutic exercise;Gait training;Balance training;Stair training;Neuromuscular re-education;Therapeutic activities;Functional mobility training;Patient/family education    PT Goals (Current goals can be found in the Care Plan section)  Acute Rehab PT Goals Patient Stated Goal: to go home PT Goal Formulation: With patient Time For Goal Achievement: 08/22/21 Potential to Achieve Goals: Good    Frequency Min 2X/week   Barriers to discharge Decreased caregiver support;Other (comment) No help at home except in evenings from nephew. 6 stairs to enter house    Co-evaluation               AM-PAC PT "6 Clicks" Mobility  Outcome Measure Help needed turning from your back to your side while in a flat bed without using bedrails?: A Little Help needed moving from lying on your back to sitting on the side of a flat bed without using bedrails?: A Little Help needed moving to and  from a bed to a chair (including a wheelchair)?: A Little Help needed standing up from a chair using your arms (e.g., wheelchair or bedside chair)?: A Little Help needed to walk in hospital room?: A Little Help needed climbing 3-5 steps with a railing? : A Lot 6 Click Score: 17    End of Session Equipment Utilized During Treatment: Gait belt;Oxygen Activity Tolerance: Patient tolerated treatment well Patient left: in bed;with call bell/phone within reach Nurse Communication: Mobility status PT Visit Diagnosis: Other abnormalities of gait and mobility (R26.89);Muscle weakness (generalized) (M62.81);Difficulty in walking, not elsewhere classified (R26.2)    Time: 0981-1914 PT Time Calculation (min) (ACUTE ONLY): 50  min   Charges:   PT Evaluation $PT Eval Moderate Complexity: 1 Mod PT Treatments $Therapeutic Activity: 8-22 mins       Fraya Ueda M. Fairly IV, PT, DPT Physical Therapist- [facility]  08/08/2021, 2:27 PM

## 2021-08-08 NOTE — Assessment & Plan Note (Signed)
Continue statin. 

## 2021-08-08 NOTE — ED Notes (Signed)
RN to bedside to answer call light. Pt complaint of Sob and "I cant breathe". Oxygen sat 78% on [place]. Pt placed on Neb mask for due treatment.

## 2021-08-08 NOTE — Procedures (Signed)
PROCEDURE SUMMARY:  Successful US guided left thoracentesis. Yielded 700 mL of clear yellow fluid. Pt tolerated procedure well. No immediate complications.  Specimen was sent for labs. CXR ordered.  EBL < 1 mL  Hedy Jacob PA-C 08/08/2021 9:56 AM

## 2021-08-08 NOTE — Hospital Course (Addendum)
80 year old woman presented from [facility] rehab PMH advanced COPD, chronic hypoxic respiratory failure on 2 L, lung cancer status post lobectomy, presented with respiratory distress. --10/16 admitted for acute on chronic hypoxic respiratory failure with COPD exacerbation --10/17 hypoxic to the 70s on nasal cannula this morning, required BiPAP to improve oxygen saturations, urgent thoracentesis this morning planned, upgraded to stepdown unit, pulmonology consultation continue to treat COPD -- 10/18 overall improved, requiring 4 L nasal cannula which is new, will need home oxygen, if continues to improve can likely discharge in the next 48 hours.

## 2021-08-08 NOTE — Assessment & Plan Note (Addendum)
--   Steroids, bronchodilators, supplemental oxygen.  Pulmonology consultation appreciated.  Overall improving, likely home soon. Marland Kitchen

## 2021-08-08 NOTE — Assessment & Plan Note (Signed)
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  Has been referred by Dr. Janese Banks to vascular surgery.

## 2021-08-08 NOTE — Assessment & Plan Note (Signed)
--   Status postthoracentesis 10/17 with removal of 700 cc

## 2021-08-08 NOTE — ED Notes (Signed)
Patient is sleeping, chest rise and fall observed.No resp distress noted. A-fib with a rate between 90-100 observed on monitor. Siderails up x 2, call bell within reach. NAD noted.

## 2021-08-08 NOTE — ED Notes (Signed)
Pt is on  at 6lpm and NRB at 15lpm. Respiratory has been called for bipap machine.

## 2021-08-08 NOTE — ED Notes (Signed)
MD at bedside. 

## 2021-08-08 NOTE — Progress Notes (Addendum)
Progress Note  Felicia Sellers   YYT:[id number]  DOB: 1941-05-29  DOA: 08/07/2021     1 Date of Service: 08/08/2021   Clinical Course 80 year old woman presented from [facility] rehab PMH advanced COPD, chronic hypoxic respiratory failure on 2 L, lung cancer status post lobectomy, presented with respiratory distress. --10/16 admitted for acute on chronic hypoxic respiratory failure with COPD exacerbation --10/17 hypoxic to the 70s on nasal cannula this morning, required BiPAP to improve oxygen saturations, urgent thoracentesis this morning planned, upgraded to stepdown unit, pulmonology consultation continue to treat COPD  Assessment and Plan * COPD with acute exacerbation (HCC) -- Steroids, bronchodilators, BiPAP as needed.  Pulmonology consultation.  Acute on chronic respiratory failure with hypoxia (HCC) -- Secondary to severe COPD exacerbation complicated by large left pleural effusion, underlying emphysema -- More hypoxic this morning requiring initiation of BiPAP, upgraded to stepdown status, urgent thoracentesis completed removal 700 cc. -- Wean BiPAP as tolerated.  Pulmonology consultation.  Emphysema ([place]) -- Treatment as per COPD  Pleural effusion on left -- Status postthoracentesis 10/17 with removal of 700 cc  Unspecified atrial fibrillation ([place]) -- New diagnosis last admission Per discharge summary, TSH was within normal limits.  TTE was without significant abnormality.  Rate controlled.  Continue beta-blocker.  Anticoagulation was not pursued based on suspicion for need of bronchoscopy -- Lovenox for now, discuss anticoagulation risk/benefit with patient as condition improves  Alcohol abuse -- Reportedly in chart, not clear whether currently drinking, monitor, CIWA  Lung mass --followed by Dr Janese Banks, with plans for repeat CT as an outpatient  Aortic atherosclerosis ([place]) -- Continue statin  Chronic diastolic CHF (congestive heart failure) (HCC) -- Mild pedal edema but  no evidence of pulmonary edema or decompensation, not on a diuretic at home, monitor volume status daily.  Continue Toprol-XL.  Chronic hyponatremia -- Appears stable, likely secondary to lung disease  Ovarian mass --followed by Dr. 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  Has been referred by Dr. Janese Banks to vascular surgery.  Tobacco dependence -- Recommend cessation  Personal history of lung cancer -- On the right side, status post lobectomy per chart  Subjective:  Feels short of breath, on BiPAP, history difficult RN found the patient with SPO2 in the 70s this morning  Objective Vitals:   08/08/21 0800 08/08/21 0900 08/08/21 0930 08/08/21 0931  BP: 124/78 127/80 110/68 110/68  Pulse: 97 98 68 65  Resp: (!) 24 (!) 30    Temp:      TempSrc:      SpO2: (!) 76% 98% 94% 99%  Weight:      Height:       53.5 kg  Vital signs were reviewed and unremarkable except for:   Hypoxia in the 84s  Exam Physical Exam Vitals reviewed.  Constitutional:      General: She is not in acute distress. Cardiovascular:     Rate and Rhythm: Normal rate and regular rhythm.     Heart sounds: No murmur heard. Pulmonary:     Effort: Respiratory distress present.     Breath sounds: Wheezing present. No rhonchi or rales.     Comments: Marked increased respiratory effort. Musculoskeletal:     Right lower leg: Edema present.     Left lower leg: Edema present.     Comments:  bilateral pedal edema.  Psychiatric:        Mood and Affect: Mood normal.  Behavior: Behavior normal.    Labs / Other Information My review of labs, imaging, notes and other tests is significant for     Sodium 129, unremarkable CBC  Disposition Plan: Status is: Inpatient  Remains inpatient appropriate because: Respiratory failure on BiPAP, admitting to SDU  DNR Enoxaparin low-dose until no more procedures anticipated   Time spent:  45 minutes critical care time inpatient with respiratory failure requiring BiPAP.  Coordinate care with radiology for expeditious thoracentesis, discussed with pulmonology  [facility] 08/08/2021, 10:51 AM

## 2021-08-08 NOTE — ED Notes (Signed)
RN weaned pt from BIPAP to [place] at 6lpm per verbal order of MD.

## 2021-08-08 NOTE — Assessment & Plan Note (Signed)
--  followed by Dr Janese Banks, with plans for repeat CT as an outpatient

## 2021-08-08 NOTE — Progress Notes (Signed)
Physical Therapy Treatment Patient Details Name: Toniette Devera MRN: [id number] DOB: 1941-03-13 Today's Date: 08/08/2021   History of Present Illness Ramyah Pankowski is a 80 y.o. female with history of lung cancer status post partial lung resection, CHF, COPD, CAD, hypertension who comes from [facility] with EMS for concerns for shortness of breath and productive cough for 3 days.    PT Comments    SaO2 on 3L at rest = 94% SaO2 on 3L while ambulating = 84% SaO2 on 8 liters of O2 while ambulating = 97%   SaO2 on 6 liters of O2 on last 10' of ambulation: 94%  Recommendations for follow up therapy are one component of a multi-disciplinary discharge planning process, led by the attending physician.  Recommendations may be updated based on patient status, additional functional criteria and insurance authorization.  Follow Up Recommendations        Equipment Recommendations       Recommendations for Other Services       Precautions / Restrictions       Mobility  Bed Mobility                    Transfers                    Ambulation/Gait                 Stairs             Wheelchair Mobility    Modified Rankin (Stroke Patients Only)       Balance                                            Cognition                                              Exercises      General Comments        Pertinent Vitals/Pain      Home Living                      Prior Function            PT Goals (current goals can now be found in the care plan section)      Frequency           PT Plan      Co-evaluation              AM-PAC PT "6 Clicks" Mobility   Outcome Measure                   End of Session               Time:  -     Charges:                       Salem Caster. Fairly IV, PT, DPT Physical Therapist- [facility]   08/08/2021, 2:16 PM

## 2021-08-09 ENCOUNTER — Encounter: Payer: Self-pay | Admitting: Family Medicine

## 2021-08-09 DIAGNOSIS — E871 Hypo-osmolality and hyponatremia: Secondary | ICD-10-CM | POA: Diagnosis not present

## 2021-08-09 DIAGNOSIS — E43 Unspecified severe protein-calorie malnutrition: Secondary | ICD-10-CM

## 2021-08-09 DIAGNOSIS — J9601 Acute respiratory failure with hypoxia: Secondary | ICD-10-CM | POA: Diagnosis not present

## 2021-08-09 DIAGNOSIS — J441 Chronic obstructive pulmonary disease with (acute) exacerbation: Secondary | ICD-10-CM | POA: Diagnosis not present

## 2021-08-09 DIAGNOSIS — I5032 Chronic diastolic (congestive) heart failure: Secondary | ICD-10-CM | POA: Diagnosis not present

## 2021-08-09 LAB — CYTOLOGY - NON PAP

## 2021-08-09 MED ORDER — METHYLPREDNISOLONE SODIUM SUCC 40 MG IJ SOLR
40.0000 mg | Freq: Every day | INTRAMUSCULAR | Status: DC
  Administered 2021-08-10 – 2021-08-11 (×2): 40 mg via INTRAVENOUS
  Filled 2021-08-09 (×2): qty 1

## 2021-08-09 MED ORDER — ENSURE ENLIVE PO LIQD
237.0000 mL | Freq: Three times a day (TID) | ORAL | Status: DC
  Administered 2021-08-09 – 2021-08-10 (×4): 237 mL via ORAL

## 2021-08-09 MED ORDER — AMOXICILLIN-POT CLAVULANATE 500-125 MG PO TABS
1.0000 | ORAL_TABLET | Freq: Three times a day (TID) | ORAL | Status: DC
  Administered 2021-08-09 – 2021-08-11 (×7): 500 mg via ORAL
  Filled 2021-08-09 (×11): qty 1

## 2021-08-09 NOTE — Progress Notes (Signed)
Initial Nutrition Assessment  DOCUMENTATION CODES:   Severe malnutrition in context of chronic illness  INTERVENTION:   Ensure Enlive po TID, each supplement provides 350 kcal and 20 grams of protein  Magic cup TID with meals, each supplement provides 290 kcal and 9 grams of protein  MVI, folic acid and thiamine po daily  Dysphagia 3 diet   Pt at high refeed risk; recommend monitor potassium, magnesium and phosphorus labs daily until stable  NUTRITION DIAGNOSIS:   Severe Malnutrition related to chronic illness (COPD, lung cancer, CHF, Etoh abuse) as evidenced by severe fat depletion, severe muscle depletion.  GOAL:   Patient will meet greater than or equal to 90% of their needs  MONITOR:   Supplement acceptance, Labs, PO intake, Weight trends, Skin, I & O's  REASON FOR ASSESSMENT:   Consult Assessment of nutrition requirement/status  ASSESSMENT:   80 y/o female with h/o etoh abuse, AA, COPD, lung cancer s/p resection, CHF, CAD, HTN, PUD and ovarian mass who is admittted with SOB.  Pt s/p thoracentesis with 751m output 10/17  Met with pt in room today. Pt reports fair appetite and oral intake at baseline but reports that she has not been eating well in hospital r/t SOB. Pt reports that her throat is dry and she is having difficulty chewing and swallowing certain foods r/t her increased work of breathing. Pt ate ~25% of her breakfast this morning. Pt reports eating ~25% of her lunch but pt did eat two ice cream cups as she reports that the cold feels good on her throat. RD suspects pt with poor oral intake at baseline r/t etoh abuse. RD discussed with pt the importance of adequate nutrition needed to preserve lean muscle. Pt reluctant agrees to try vanilla or strawberry Ensure. RD will add Magic Cups to meal trays. Pt is likely at high refeed risk. Per chart, pt appears to have weight gain pta. Pt down ~4lbs since admit.   Medications reviewed and include: augmentin, colace,  lovenox, folic acid, solu-medrol, MVI, nicotine, thiamine   Labs reviewed: Na 129(L) BNP 581.7(H)- 10/16  NUTRITION - FOCUSED PHYSICAL EXAM:  Flowsheet Row Most Recent Value  Orbital Region Moderate depletion  Upper Arm Region Severe depletion  Thoracic and Lumbar Region Severe depletion  Buccal Region Moderate depletion  Temple Region Moderate depletion  Clavicle Bone Region Severe depletion  Clavicle and Acromion Bone Region Severe depletion  Scapular Bone Region Severe depletion  Dorsal Hand Severe depletion  Patellar Region Severe depletion  Anterior Thigh Region Severe depletion  Posterior Calf Region Severe depletion  Edema (RD Assessment) Mild  Hair Reviewed  Eyes Reviewed  Mouth Reviewed  Skin Reviewed  Nails Reviewed   Diet Order:   Diet Order             Diet regular Room service appropriate? Yes; Fluid consistency: Thin  Diet effective now                  EDUCATION NEEDS:   Education needs have been addressed  Skin:  Skin Assessment: Reviewed RN Assessment (ecchymosis)  Last BM:  pta  Height:   Ht Readings from Last 1 Encounters:  08/07/21 5' 3" (1.6 m)    Weight:   Wt Readings from Last 1 Encounters:  08/09/21 51.6 kg    Ideal Body Weight:  52.3 kg  BMI:  Body mass index is 20.15 kg/m.  Estimated Nutritional Needs:   Kcal:  1400-1600kcal/day  Protein:  70-80g/day  Fluid:  1.3-1.5L/day  Koleen Distance MS, RD, LDN Please refer to [facility] for RD and/or RD on-call/weekend/after hours pager

## 2021-08-09 NOTE — Progress Notes (Signed)
Progress Note Felicia Sellers   WGY:[id number]  DOB: [birth year]  DOA: 08/07/2021     2 Date of Service: 08/09/2021   Clinical Course 80 year old woman presented from [place] rehab PMH advanced COPD, chronic hypoxic respiratory failure on 2 L, lung cancer status post lobectomy, presented with respiratory distress. --10/16 admitted for acute on chronic hypoxic respiratory failure with COPD exacerbation --10/17 hypoxic to the 70s on nasal cannula this morning, required BiPAP to improve oxygen saturations, urgent thoracentesis this morning planned, upgraded to stepdown unit, pulmonology consultation continue to treat COPD -- 10/18 overall improved, requiring 4 L nasal cannula which is new, will need home oxygen, if continues to improve can likely discharge in the next 48 hours with HHPT  Assessment and Plan * COPD with acute exacerbation (HCC) -- Steroids, bronchodilators, supplemental oxygen.  Pulmonology consultation appreciated.  Overall improving, likely home soon. .  Acute on chronic respiratory failure with hypoxia (HCC) -- Secondary to severe COPD exacerbation complicated by large left pleural effusion, underlying emphysema -- Much improved after thoracentesis 10/17.  Off BiPAP.  Currently on 4 L nasal cannula which is new, patient reports she has not used oxygen at home. --Arrange home oxygen, anticipate discharge next 1 to 2 days based on clinical improvement.  Emphysema ([place]) -- Treatment as per COPD  Pleural effusion on left -- Status postthoracentesis 10/17 with removal of 700 cc  Unspecified atrial fibrillation ([place]) -- New diagnosis last admission Per discharge summary, TSH was within normal limits.  TTE was without significant abnormality.  Rate controlled.  Continue beta-blocker.  Anticoagulation was not pursued based on suspicion for need of bronchoscopy -- Prophylactic Lovenox for now.  Discuss risk/benefit of anticoagulation as an outpatient.  Alcohol abuse --  Reportedly in chart, not clear whether currently drinking, monitor, CIWA.  No signs or symptoms of withdrawal.  Protein-calorie malnutrition, severe Ensure Enlive po TID, each supplement provides 350 kcal and 20 grams of protein Magic cup TID with meals, each supplement provides 290 kcal and 9 grams of protein MVI, folic acid and thiamine po daily  Lung mass --followed by Dr Janese Banks, with plans for repeat CT as an outpatient  Aortic atherosclerosis ([place]) -- Continue statin  Chronic diastolic CHF (congestive heart failure) ([place]) -- Mild pedal edema but no evidence of pulmonary edema or decompensation, not on a diuretic at home, monitor volume status daily.  Continue Toprol-XL.  Appears stable.  Chronic hyponatremia -- Appears stable, likely secondary to lung disease  Ovarian mass --followed by Dr. 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  Has been referred by Dr. Janese Banks to vascular surgery.  Tobacco dependence -- Recommend cessation  Personal history of lung cancer -- On the right side, status post lobectomy per chart  Subjective:  Feels better, breathing better.  Still not back to baseline and more short of breath than usual.  Objective Vitals:   08/09/21 0752 08/09/21 1109 08/09/21 1320 08/09/21 1431  BP:  127/86    Pulse: 94 94 96   Resp: 18 18 (!) 24   Temp:  (!) 97.5 F (36.4 C)    TempSrc:  Oral    SpO2: 95% 100% 96%   Weight:    51.6 kg  Height:       51.6 kg  Vital signs were reviewed and unremarkable.  Exam Physical Exam Constitutional:      General: She is not in acute distress.    Appearance: She  is ill-appearing. She is not toxic-appearing.  Cardiovascular:     Rate and Rhythm: Normal rate and regular rhythm.     Heart sounds: No murmur heard. Pulmonary:     Breath sounds: No wheezing, rhonchi or rales.     Comments: Mild to moderate increased respiratory effort, able to speak  in sentences.  Appears short of breath. Musculoskeletal:     Right lower leg: Edema present.     Left lower leg: Edema present.     Comments: Mild bilateral pedal edema.  Neurological:     Mental Status: She is alert.  Psychiatric:        Mood and Affect: Mood normal.        Behavior: Behavior normal.    Labs / Other Information My review of labs, imaging, notes and other tests is significant for     Sodium 129, procalcitonin negative, CBC unremarkable.  Disposition Plan: Status is: Inpatient  Remains inpatient appropriate because: COPD exacerbation, breathing not back to baseline, continues to require oxygen which is new  DNR  Time spent: 20 minutes [facility] 08/09/2021, 3:27 PM

## 2021-08-09 NOTE — Assessment & Plan Note (Signed)
   Ensure Enlive po TID, each supplement provides 350 kcal and 20 grams of protein  Magic cup TID with meals, each supplement provides 290 kcal and 9 grams of protein  MVI, folic acid and thiamine po daily

## 2021-08-09 NOTE — Progress Notes (Signed)
Physical Therapy Treatment Patient Details Name: Felicia Sellers MRN: [id number] DOB: [birth year] Today's Date: 08/09/2021   History of Present Illness Felicia Sellers is a 80 y.o. female with history of lung cancer status post partial lung resection, CHF, COPD, CAD, hypertension who comes from [place] with EMS for concerns for shortness of breath and productive cough for 3 days.    PT Comments    Pt received upright in bed agreeable to PT. Pt able to articulate her words louder today and more clear and overall appears like she is feeling better. Resting on 4L/min via [place] with HR trending in low 100's and Spo2 at 90%. Remains supervision for bed mobility to supine to sit EOB and remains needing minor VC's for hand placement with standing to RW.  Pt able to progress ambulation to 29' today with supervision but requesting to return to room due to fatigue in LE's. Pt returned to sitting EoB with HR up to 113-118 BPM and Spo2 to 81% remaining on 4L. Quick return of sats > 90% after ~30 sec seated rest at EOB. Pt still displaying minor deficits in safety awareness with transfers and deficits in strength/endurance with OOB activity thus would benefit from [facility] PT to optimize function and safety with OOB with LRAD.    Recommendations for follow up therapy are one component of a multi-disciplinary discharge planning process, led by the attending physician.  Recommendations may be updated based on patient status, additional functional criteria and insurance authorization.  Follow Up Recommendations  Home health PT;Supervision for mobility/OOB     Equipment Recommendations  None recommended by PT    Recommendations for Other Services       Precautions / Restrictions Precautions Precautions: Fall Restrictions Weight Bearing Restrictions: No     Mobility  Bed Mobility Overal bed mobility: Needs Assistance Bed Mobility: Supine to Sit     Supine to sit: Supervision;HOB elevated Sit to  supine: Supervision;HOB elevated     Patient Response: Cooperative  Transfers Overall transfer level: Needs assistance Equipment used: Rolling walker (2 wheeled) Transfers: Sit to/from Stand Sit to Stand: Supervision         General transfer comment: Required VC's for hand placement.  Ambulation/Gait Ambulation/Gait assistance: Supervision Gait Distance (Feet): 50 Feet Assistive device: Rolling walker (2 wheeled) Gait Pattern/deviations: Step-through pattern;Trunk flexed Gait velocity: decreased   General Gait Details: Remains stable with RW but does continue to endorse LE weakness   Stairs             Wheelchair Mobility    Modified Rankin (Stroke Patients Only)       Balance Overall balance assessment: Needs assistance Sitting-balance support: Feet unsupported;No upper extremity supported Sitting balance-Leahy Scale: Fair     Standing balance support: Bilateral upper extremity supported Standing balance-Leahy Scale: Fair Standing balance comment: Minor need of RW for steadiness. More necessary for LE weakness pt is endorsing moreso than balance.                            Cognition Arousal/Alertness: Awake/alert Behavior During Therapy: WFL for tasks assessed/performed Overall Cognitive Status: Within Functional Limits for tasks assessed                                        Exercises      General Comments General comments (skin integrity, edema, etc.): Desat  on 4L from 94% with amb to 81% after amb 50'. Pt asymptomatic with quick return to > 90% with seated rest ~30 sec      Pertinent Vitals/Pain Pain Assessment: No/denies pain    Home Living                      Prior Function            PT Goals (current goals can now be found in the care plan section) Acute Rehab PT Goals Patient Stated Goal: to go home PT Goal Formulation: With patient Time For Goal Achievement: 08/22/21 Potential to Achieve  Goals: Good Progress towards PT goals: Progressing toward goals    Frequency    Min 2X/week      PT Plan Current plan remains appropriate    Co-evaluation              AM-PAC PT "6 Clicks" Mobility   Outcome Measure  Help needed turning from your back to your side while in a flat bed without using bedrails?: A Little Help needed moving from lying on your back to sitting on the side of a flat bed without using bedrails?: A Little Help needed moving to and from a bed to a chair (including a wheelchair)?: A Little Help needed standing up from a chair using your arms (e.g., wheelchair or bedside chair)?: A Little Help needed to walk in hospital room?: A Little Help needed climbing 3-5 steps with a railing? : A Lot 6 Click Score: 17    End of Session Equipment Utilized During Treatment: Gait belt;Oxygen Activity Tolerance: Patient tolerated treatment well Patient left: in bed;with call bell/phone within reach Nurse Communication: Mobility status PT Visit Diagnosis: Other abnormalities of gait and mobility (R26.89);Muscle weakness (generalized) (M62.81);Difficulty in walking, not elsewhere classified (R26.2)     Time: 5035-4656 PT Time Calculation (min) (ACUTE ONLY): 31 min  Charges:  $Therapeutic Activity: 23-37 mins                     Felicia Sellers IV, PT, DPT Physical Therapist- [facility]  08/09/2021, 10:55 AM

## 2021-08-09 NOTE — Progress Notes (Signed)
Pulmonary Medicine          Date: 08/09/2021,   MRN# [id number] Felicia Sellers 04/10/1941     AdmissionWeight: 53.5 kg                 CurrentWeight: 53.5 kg  Refferring physician: Dr Sarajane Jews    CHIEF COMPLAINT:   Acute on chronic hypoxemic respiratory failure   HISTORY OF PRESENT ILLNESS   This is a 80 yo F with hx of Aortic aneurysm, Right lung cancer s/p resection , CHF, chronic hyponatremia, COPD with chronic hypoxemia on 3L/min [place] supplemental O2 at home. She has hx of gastric ulceration, essential HTN, and ovarian mass, who came in after acute on sent SOB and was admitted with COPD exacerbation. She was noted to be hypoxemic in the 70s.  She had chest imaging done and was noted to have large left sided pleural effusion. This was drained and fluid was cloudy straw colored appx 700cc.  I was present during procedure and there were no complications noted. Fluid profile thus far consistent with lymphocyte predominant transudate. PCCM consultation for further evaluation and management.   08/09/21- patient shares she is feeling better and is now on 3L/min [place]. She is speaking in full sentences and is normoxic but does report dyspnea. Plan to continue current regimen. Dcd Cefepime and started augmetin 500/125 tid with solumedrol reduced to 40mg  daily.  PAST MEDICAL HISTORY   Past Medical History:  Diagnosis Date   Aortic aneurysm ([place]) 08/08/2021   Aortic atherosclerosis ([place]) 08/08/2021   Cancer ([place])    right lung removed - lung cancer 1/3 lung removed    CHF (congestive heart failure) (HCC)    Chronic diastolic CHF (congestive heart failure) ([place]) 08/08/2021   Chronic hyponatremia 11/04/2015   Overview:  Normal serum oslmolality   COPD (chronic obstructive pulmonary disease) (HCC)    Coronary artery disease    Emphysema ([place]) 08/08/2021   History of stomach ulcers    Hypertension    Lung mass 08/08/2021   Ovarian mass 08/08/2021   Pneumonia    Unspecified  atrial fibrillation ([place]) 08/08/2021     SURGICAL HISTORY   Past Surgical History:  Procedure Laterality Date   LUNG REMOVAL, PARTIAL Left      FAMILY HISTORY   Family History  Problem Relation Age of Onset   Breast cancer Neg Hx      SOCIAL HISTORY   Social History   Tobacco Use   Smoking status: Former    Packs/day: 1.00    Years: 66.00    Pack years: 66.00    Types: Cigarettes    Quit date: 06/26/2020    Years since quitting: 1.1   Smokeless tobacco: Never  Vaping Use   Vaping Use: Never used  Substance Use Topics   Alcohol use: Not Currently    Comment: use to drink alot ( vodka) 6 months ago   Drug use: Never     MEDICATIONS    Home Medication:  REM   Current Medication:  Current Facility-Administered Medications:    acetaminophen (TYLENOL) tablet 650 mg, 650 mg, Oral, Q6H PRN **OR** acetaminophen (TYLENOL) suppository 650 mg, 650 mg, Rectal, Q6H PRN, Karmen Bongo, MD   albuterol (PROVENTIL) (2.5 MG/3ML) 0.083% nebulizer solution 2.5 mg, 2.5 mg, Nebulization, Q2H PRN, Karmen Bongo, MD, 2.5 mg at 08/08/21 0810   atorvastatin (LIPITOR) tablet 40 mg, 40 mg, Oral, QHS, Karmen Bongo, MD, 40 mg at 08/08/21 2111   bisacodyl (DULCOLAX) EC  tablet 5 mg, 5 mg, Oral, Daily PRN, Karmen Bongo, MD   ceFEPIme (MAXIPIME) 2 g in sodium chloride 0.9 % 100 mL IVPB, 2 g, Intravenous, Q12H, Karmen Bongo, MD, Stopped at 08/08/21 2341   docusate sodium (COLACE) capsule 100 mg, 100 mg, Oral, BID, Karmen Bongo, MD, 100 mg at 08/08/21 2111   enoxaparin (LOVENOX) injection 40 mg, 40 mg, Subcutaneous, Q24H, Karmen Bongo, MD, 40 mg at 71/06/26 9485   folic acid (FOLVITE) tablet 1 mg, 1 mg, Oral, Daily, Samuella Cota, MD   gabapentin (NEURONTIN) capsule 300 mg, 300 mg, Oral, TID, Karmen Bongo, MD, 300 mg at 08/08/21 2111   guaiFENesin (MUCINEX) 12 hr tablet 600 mg, 600 mg, Oral, BID PRN, Karmen Bongo, MD, 600 mg at 08/07/21 1429   hydrALAZINE  (APRESOLINE) injection 5 mg, 5 mg, Intravenous, Q4H PRN, Karmen Bongo, MD   HYDROcodone-acetaminophen (NORCO/VICODIN) 5-325 MG per tablet 1-2 tablet, 1-2 tablet, Oral, Q4H PRN, Karmen Bongo, MD   ipratropium-albuterol (DUONEB) 0.5-2.5 (3) MG/3ML nebulizer solution 3 mL, 3 mL, Nebulization, Q6H, Karmen Bongo, MD, 3 mL at 08/09/21 0747   LORazepam (ATIVAN) tablet 1-4 mg, 1-4 mg, Oral, Q1H PRN **OR** LORazepam (ATIVAN) injection 1-4 mg, 1-4 mg, Intravenous, Q1H PRN, Samuella Cota, MD   methylPREDNISolone sodium succinate (SOLU-MEDROL) 125 mg/2 mL injection 80 mg, 80 mg, Intravenous, Daily, Samuella Cota, MD, 80 mg at 08/08/21 1023   metoprolol succinate (TOPROL-XL) 24 hr tablet 25 mg, 25 mg, Oral, Daily, Karmen Bongo, MD, 25 mg at 08/08/21 1026   morphine 2 MG/ML injection 2 mg, 2 mg, Intravenous, Q2H PRN, Karmen Bongo, MD   multivitamin with minerals tablet 1 tablet, 1 tablet, Oral, Daily, Samuella Cota, MD   nicotine (NICODERM CQ - dosed in mg/24 hours) patch 14 mg, 14 mg, Transdermal, Daily, Karmen Bongo, MD, 14 mg at 08/08/21 1030   ondansetron (ZOFRAN) tablet 4 mg, 4 mg, Oral, Q6H PRN **OR** ondansetron (ZOFRAN) injection 4 mg, 4 mg, Intravenous, Q6H PRN, Karmen Bongo, MD   polyethylene glycol (MIRALAX / GLYCOLAX) packet 17 g, 17 g, Oral, Daily PRN, Karmen Bongo, MD   sodium chloride flush (NS) 0.9 % injection 3 mL, 3 mL, Intravenous, Q12H, Karmen Bongo, MD, 3 mL at 08/08/21 0948   thiamine tablet 100 mg, 100 mg, Oral, Daily, 100 mg at 08/08/21 1342 **OR** thiamine (B-1) injection 100 mg, 100 mg, Intravenous, Daily, Samuella Cota, MD   zolpidem Lorrin Mais) tablet 5 mg, 5 mg, Oral, QHS PRN, Karmen Bongo, MD, 5 mg at 08/07/21 2306    ALLERGIES   Aspirin     REVIEW OF SYSTEMS    Review of Systems:  Gen:  Denies  fever, sweats, chills weigh loss  HEENT: Denies blurred vision, double vision, ear pain, eye pain, hearing loss, nose bleeds, sore  throat Cardiac:  No dizziness, chest pain or heaviness, chest tightness,edema Resp:   Denies cough or sputum porduction, shortness of breath,wheezing, hemoptysis,  Gi: Denies swallowing difficulty, stomach pain, nausea or vomiting, diarrhea, constipation, bowel incontinence Gu:  Denies bladder incontinence, burning urine Ext:   Denies Joint pain, stiffness or swelling Skin: Denies  skin rash, easy bruising or bleeding or hives Endoc:  Denies polyuria, polydipsia , polyphagia or weight change Psych:   Denies depression, insomnia or hallucinations   Other:  All other systems negative   VS: BP 124/81   Pulse 94   Temp (!) 97.5 F (36.4 C) (Oral)   Resp 18   Ht 5\' 3"  (  1.6 m)   Wt 53.5 kg   SpO2 95%   BMI 20.90 kg/m      PHYSICAL EXAM    GENERAL:NAD, no fevers, chills, no weakness no fatigue HEAD: Normocephalic, atraumatic.  EYES: Pupils equal, round, reactive to light. Extraocular muscles intact. No scleral icterus.  MOUTH: Moist mucosal membrane. Dentition intact. No abscess noted.  EAR, NOSE, THROAT: Clear without exudates. No external lesions.  NECK: Supple. No thyromegaly. No nodules. No JVD.  PULMONARY: Decreased air entry b/l worse on left lung  CARDIOVASCULAR: S1 and S2. Regular rate and rhythm. No murmurs, rubs, or gallops. No edema. Pedal pulses 2+ bilaterally.  GASTROINTESTINAL: Soft, nontender, nondistended. No masses. Positive bowel sounds. No hepatosplenomegaly.  MUSCULOSKELETAL: No swelling, clubbing, or edema. Range of motion full in all extremities.  NEUROLOGIC: Cranial nerves II through XII are intact. No gross focal neurological deficits. Sensation intact. Reflexes intact.  SKIN: No ulceration, lesions, rashes, or cyanosis. Skin warm and dry. Turgor intact.  PSYCHIATRIC: Mood, affect within normal limits. The patient is awake, alert and oriented x 3. Insight, judgment intact.       IMAGING      CT CHEST W CONTRAST  Result Date: 08/07/2021 CLINICAL  DATA:  COPD exacerbation EXAM: CT CHEST WITH CONTRAST TECHNIQUE: Multidetector CT imaging of the chest was performed during intravenous contrast administration. CONTRAST:  30mL OMNIPAQUE IOHEXOL 300 MG/ML  SOLN COMPARISON:  07/09/2021 FINDINGS: Cardiovascular: Cardiomegaly. No pericardial effusion. Advanced atherosclerosis of the aorta with irregular plaque diffusely and the descending aneurysm which measures 4.9 Cm in diameter. No pulmonary artery filling defect. Mediastinum/Nodes: No adenopathy mass. Mild food retention of the esophagus. Lungs/Pleura: Moderate to large left pleural effusion which is simple density with extensive atelectasis. Significant clearing right-sided pneumonia since prior CT. Postoperative right upper lobe. Improved nodularity on prior which is also attributed to infection. Emphysema. Upper Abdomen: Extensive atherosclerosis. Hepatic venous reflux attributed to right heart failure Musculoskeletal: Exaggerated kyphosis.  No acute finding IMPRESSION: 1. Moderate to large simple left pleural effusion with multi segment atelectasis. 2. Essentially resolved pneumonia seen on prior CT. 3. Advanced atherosclerosis especially of the aorta with descending aneurysm measuring up to 4.9 cm in diameter. Follow-up depends on true morbidity/life expectancy. Aortic Atherosclerosis (ICD10-I70.0) and Emphysema (ICD10-J43.9). Electronically Signed   By: Jorje Guild M.D.   On: 08/07/2021 10:22   MR BRAIN W WO CONTRAST  Result Date: 07/10/2021 CLINICAL DATA:  Hematologic malignancy, staging. EXAM: MRI HEAD WITHOUT AND WITH CONTRAST TECHNIQUE: Multiplanar, multiecho pulse sequences of the brain and surrounding structures were obtained without and with intravenous contrast. CONTRAST:  31mL GADAVIST GADOBUTROL 1 MMOL/ML IV SOLN COMPARISON:  Head CT 05/28/2014 FINDINGS: Brain: No enhancement or swelling to suggest metastatic disease. No acute infarction, hemorrhage, hydrocephalus, extra-axial collection or  mass lesion. Chronic small vessel ischemia that is extensive in the hemispheric white matter and mild in the pons. Small remote bilateral cerebellar infarcts. Vascular: Normal flow voids. Skull and upper cervical spine: Normal marrow signal. Sinuses/Orbits: Negative. IMPRESSION: 1. No acute finding or evidence of metastatic disease. 2. Extensive chronic small vessel ischemia. Electronically Signed   By: Jorje Guild M.D.   On: 07/10/2021 11:39   NM PET Image Initial (PI) Skull Base To Thigh  Result Date: 07/26/2021 CLINICAL DATA:  Initial treatment strategy for right lung mass. EXAM: NUCLEAR MEDICINE PET SKULL BASE TO THIGH TECHNIQUE: 6.1 mCi F-18 FDG was injected intravenously. Full-ring PET imaging was performed from the skull base to thigh after the radiotracer.  CT data was obtained and used for attenuation correction and anatomic localization. Fasting blood glucose: 78 mg/dl COMPARISON:  CT chest 07/09/2021 FINDINGS: Mediastinal blood pool activity: SUV max 1.9 Liver activity: SUV max NA NECK: No significant abnormal hypermetabolic activity in this region. Incidental CT findings: Bilateral common carotid atherosclerotic calcifications. CHEST: Substantial clearing of prior airspace opacity with mild residual peripheral nodularity in the remaining right lung, particularly peripherally in the right lower lobe, which warrants surveillance but is currently not of high suspicion for active malignancy. This clustered peripheral nodularity has a maximum SUV up to 1.9. No central obstructing hypermetabolic lesion is identified. Right lower paratracheal lymph node 1.2 cm in short axis with maximum SUV 2.4, previous maximum SUV 3.5. Incidental CT findings: Centrilobular emphysema. Coronary, aortic arch, and branch vessel atherosclerotic vascular disease. 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 Aneurysm of the descending thoracic aorta 5.1 cm in diameter. Moderate cardiomegaly. Dense mitral valve  calcification. Trace pericardial effusion posteriorly. Small bilateral pleural effusions with associated passive atelectasis. Postoperative findings in the right lung. There is also some mild peripheral scattered nodularity in the left lung which is moderately improved compared to 07/09/2021. ABDOMEN/PELVIS: Complex cystic lesion in the left adnexa measuring 11.3 by 6.3 cm on image 191 series 4 with several loculations of varying density, but photopenic on the PET data. Smaller photopenic lesion of the right ovary. Incidental CT findings: Gallbladder wall thickening, nonspecific although inflammation is not readily excluded. Extensive atherosclerosis. Sigmoid colon diverticulosis. Diffuse subcutaneous edema. SKELETON: No significant abnormal hypermetabolic activity in this region. Incidental CT findings: Degenerative hip arthropathy, right greater than left. IMPRESSION: 1. Interval clearance of the vast majority of the airspace opacity in the right lung. There some small peripheral areas of scattered nodularity which merit chest CT surveillance but which are not substantially hypermetabolic today, and accordingly may potentially be inflammatory/infectious. Postoperative findings in the right lung. 2. Stable mild paratracheal adenopathy, although SUV has decreased compared to the prior chest CT of 08/22/2018. 3. Complex cystic lesion of the left adnexa measuring up to 11.3 cm in long axis, photopenic. Smaller lesion of the right ovary is not substantially hypermetabolic either. 4. 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 Recommend semi-annual imaging followup by CTA or MRA and referral to cardiothoracic surgery if not already obtained. This recommendation follows 2010 ACCF/AHA/AATS/ACR/ASA/SCA/SCAI/SIR/STS/SVM Guidelines for the Diagnosis and Management of Patients With Thoracic Aortic Disease. Circulation. 2010; 121: T245-Y09. Aortic aneurysm NOS (ICD10-I71.9) 5. Other  imaging findings of potential clinical significance: Aortic Atherosclerosis (ICD10-I70.0) and Emphysema (ICD10-J43.9). Coronary atherosclerosis and extensive abdominal atherosclerosis. Moderate cardiomegaly. Dense mitral valve calcifications. Small bilateral pleural effusions along with subcutaneous edema suggesting mild third spacing of fluid. Nonspecific wall thickening in the gallbladder. Sigmoid colon diverticulosis. Degenerative hip arthropathy. Electronically Signed   By: Van Clines M.D.   On: 07/26/2021 07:13   DG Chest Port 1 View  Result Date: 08/08/2021 CLINICAL DATA:  Status post thoracentesis on the left side. EXAM: PORTABLE CHEST 1 VIEW COMPARISON:  08/07/2021 FINDINGS: Improved aeration in the left lower chest. Residual densities at the left lung base. Prominent interstitial lung markings bilaterally and minimally changed. Blunting of the right costophrenic angle is suggestive for right pleural fluid. Negative for pneumothorax. Heart size remains enlarged. IMPRESSION: 1. Negative for a pneumothorax following the left thoracentesis. 2. Improved aeration in the left lower lung with residual densities at the left lung base. 3. Evidence for right pleural effusion. Electronically  Signed   By: Markus Daft M.D.   On: 08/08/2021 10:38   DG Chest Portable 1 View  Result Date: 08/07/2021 CLINICAL DATA:  Increasing shortness of breath EXAM: PORTABLE CHEST 1 VIEW COMPARISON:  07/08/2021 FINDINGS: Improved right lung aeration. Worse left base aeration at least partially from pleural fluid. Small right pleural effusion. Generalized reticulonodular opacity. Chronic cardiomegaly. IMPRESSION: 1. Left more than right pleural effusion with obscured or opacified left lower lobe. 2. Significant clearance of right-sided pneumonia since chest x-ray last month. 3. Background chronic lung disease and nodularity reference recent PET. Electronically Signed   By: Jorje Guild M.D.   On: 08/07/2021 05:04   US  THORACENTESIS ASP PLEURAL SPACE W/IMG GUIDE  Result Date: 08/08/2021 INDICATION: Patient with shortness of breath and large left pleural effusion request received for thoracentesis. EXAM: ULTRASOUND GUIDED DIAGNOSTIC AND THERAPEUTIC THORACENTESIS MEDICATIONS: Local 1% lidocaine only. COMPLICATIONS: None immediate. PROCEDURE: An ultrasound guided thoracentesis was thoroughly discussed with the patient and questions answered. The benefits, risks, alternatives and complications were also discussed. The patient understands and wishes to proceed with the procedure. Written consent was obtained. Ultrasound was performed to localize and mark an adequate pocket of fluid in the left chest. The area was then prepped and draped in the normal sterile fashion. 1% Lidocaine was used for local anesthesia. Under ultrasound guidance a 6 Fr Safe-T-Centesis catheter was introduced. Thoracentesis was performed. The catheter was removed and a dressing applied. FINDINGS: A total of approximately 700 mL of clear yellow fluid was removed. Samples were sent to the laboratory as requested by the clinical team. IMPRESSION: Successful ultrasound guided left thoracentesis yielding 700 mL of pleural fluid. Read By: Tsosie Billing PA-C Electronically Signed   By: Ruthann Cancer M.D.   On: 08/08/2021 10:47      ASSESSMENT/PLAN   Acute on chronic hypoxemic respiratory failure   - currently seems to have complete atelectasis of LLL due to large left pleural effusion    - recommend incentive spirometry and flutter valve -agree with empiric tx for CAP and COPD - agree with cefepime -agree with solumedrol - currently at dose of 80mg  would reduce to 40mg  -poor prognosis in DNR patient would recommend palliative evaluation  Large left pleural effusion   - s/p aspiration of 700cc cloudly straw colored lymphocyte predominant transudate   - this is likely due to CKD/CHF and is likely the etiology    - due to hx of cancer would evaluate  cytology      Thank you for allowing me to participate in the care of this patient.  Total face to face encounter time for this patient visit was >45 min. >50% of the time was  spent in counseling and coordination of care.   Patient/Family are satisfied with care plan and all questions have been answered.  This document was prepared using Dragon voice recognition software and may include unintentional dictation errors.     Ottie Glazier, M.D.  Division of [place]

## 2021-08-09 NOTE — TOC Progression Note (Signed)
Transition of Care [facility]) - Progression Note    Patient Details  Name: Delisha Peaden MRN: [id number] Date of Birth: Dec 10, 1940  Transition of Care [facility] CM/SW Contact  Eileen Stanford, LCSW Phone Number: 08/09/2021, 10:13 AM  Clinical Narrative:  [place] will service at dc.     Expected Discharge Plan: [place] Barriers to Discharge: Continued Medical Work up  Expected Discharge Plan and Services Expected Discharge Plan: [place] In-house Referral: Clinical Social Work   Post Acute Care Choice: [place] Living arrangements for the past 2 months: [place] [facility])                                       Social Determinants of Health (SDOH) Interventions    Readmission Risk Interventions No flowsheet data found.

## 2021-08-10 DIAGNOSIS — Z7189 Other specified counseling: Secondary | ICD-10-CM

## 2021-08-10 DIAGNOSIS — F1010 Alcohol abuse, uncomplicated: Secondary | ICD-10-CM

## 2021-08-10 DIAGNOSIS — J441 Chronic obstructive pulmonary disease with (acute) exacerbation: Secondary | ICD-10-CM | POA: Diagnosis not present

## 2021-08-10 DIAGNOSIS — J9621 Acute and chronic respiratory failure with hypoxia: Secondary | ICD-10-CM | POA: Diagnosis not present

## 2021-08-10 LAB — CULTURE, RESPIRATORY W GRAM STAIN

## 2021-08-10 LAB — GLUCOSE, CAPILLARY: Glucose-Capillary: 107 mg/dL — ABNORMAL HIGH (ref 70–99)

## 2021-08-10 MED ORDER — SULFAMETHOXAZOLE-TRIMETHOPRIM 800-160 MG PO TABS
1.0000 | ORAL_TABLET | Freq: Two times a day (BID) | ORAL | Status: DC
  Administered 2021-08-10 – 2021-08-11 (×3): 1 via ORAL
  Filled 2021-08-10 (×5): qty 1

## 2021-08-10 NOTE — Progress Notes (Signed)
Physical Therapy Treatment Patient Details Name: Felicia Sellers MRN: [id number] DOB: September 18, 1941 Today's Date: 08/10/2021   History of Present Illness Felicia Sellers is a 80 y.o. female with history of lung cancer status post partial lung resection, CHF, COPD, CAD, hypertension who comes from [place] with EMS for concerns for shortness of breath and productive cough for 3 days.    PT Comments    Pt received supine in bed with NT in room taking vitals. Agreeable to PT services. Pt remains supervision for all mobility requiring min VC's for safe hand placement. Remains on 4L/min with resting HR trending at 102 BPM and SPO2 at 94%. Pt tolerated progression of gait to 7' with RW and maintained SPO2 > 90% throughout on 4L/min via [place] with max HR reaching 118 BPM. Minor SOB noted but subsided with seated rest after ~1 min. Pt SPT with no AD to [facility] to void bladder continently. Able to perform transfers with supervision and perform perihygiene in sitting independently. SPT return to bed with same level of supervision. D/c recs remain appropriate to progress safe ambulation and endurance with functional mobility in home environment.     Recommendations for follow up therapy are one component of a multi-disciplinary discharge planning process, led by the attending physician.  Recommendations may be updated based on patient status, additional functional criteria and insurance authorization.  Follow Up Recommendations  Home health PT;Supervision for mobility/OOB     Equipment Recommendations  None recommended by PT    Recommendations for Other Services       Precautions / Restrictions Precautions Precautions: Fall Restrictions Weight Bearing Restrictions: No     Mobility  Bed Mobility Overal bed mobility: Needs Assistance Bed Mobility: Supine to Sit;Sit to Supine     Supine to sit: Supervision;HOB elevated Sit to supine: Supervision     Patient Response:  Cooperative  Transfers Overall transfer level: Needs assistance Equipment used: Rolling walker (2 wheeled) Transfers: Sit to/from Stand Sit to Stand: Supervision         General transfer comment: Required VC's for hand placement.  Ambulation/Gait Ambulation/Gait assistance: Supervision Gait Distance (Feet): 75 Feet Assistive device: Rolling walker (2 wheeled) Gait Pattern/deviations: Step-through pattern;Trunk flexed Gait velocity: decreased   General Gait Details: Remains stable with RW but does continue to endorse LE weakness. Able to perform head turns during amb with no LOB.   Stairs             Wheelchair Mobility    Modified Rankin (Stroke Patients Only)       Balance Overall balance assessment: Needs assistance Sitting-balance support: Feet unsupported;No upper extremity supported Sitting balance-Leahy Scale: Fair     Standing balance support: Bilateral upper extremity supported Standing balance-Leahy Scale: Fair Standing balance comment: Minor need of RW for steadiness. More necessary for LE weakness pt is endorsing moreso than balance.                            Cognition Arousal/Alertness: Awake/alert Behavior During Therapy: WFL for tasks assessed/performed Overall Cognitive Status: Within Functional Limits for tasks assessed                                        Exercises General Exercises - Lower Extremity Long Arc Quad: AROM;Strengthening;Both;10 reps;Seated Hip Flexion/Marching: AROM;Strengthening;Both;10 reps;Seated    General Comments General comments (skin integrity, edema, etc.):  Remains >90% on 4L throughout ambulation today. Does display minor SOB but did progress ambulation distance. Max HR up to 118 BPM      Pertinent Vitals/Pain Pain Assessment: No/denies pain    Home Living                      Prior Function            PT Goals (current goals can now be found in the care plan  section) Acute Rehab PT Goals Patient Stated Goal: to go home PT Goal Formulation: With patient Time For Goal Achievement: 08/22/21 Potential to Achieve Goals: Good Progress towards PT goals: Progressing toward goals    Frequency    Min 2X/week      PT Plan Current plan remains appropriate    Co-evaluation              AM-PAC PT "6 Clicks" Mobility   Outcome Measure  Help needed turning from your back to your side while in a flat bed without using bedrails?: A Little Help needed moving from lying on your back to sitting on the side of a flat bed without using bedrails?: A Little Help needed moving to and from a bed to a chair (including a wheelchair)?: A Little Help needed standing up from a chair using your arms (e.g., wheelchair or bedside chair)?: A Little Help needed to walk in hospital room?: A Little Help needed climbing 3-5 steps with a railing? : A Lot 6 Click Score: 17    End of Session Equipment Utilized During Treatment: Gait belt;Oxygen Activity Tolerance: Patient tolerated treatment well Patient left: in bed;with call bell/phone within reach Nurse Communication: Mobility status PT Visit Diagnosis: Other abnormalities of gait and mobility (R26.89);Muscle weakness (generalized) (M62.81);Difficulty in walking, not elsewhere classified (R26.2)     Time: 0174-9449 PT Time Calculation (min) (ACUTE ONLY): 20 min  Charges:  $Therapeutic Exercise: 8-22 mins                    Salem Caster. Fairly IV, PT, DPT Physical Therapist- [facility]  08/10/2021, 11:58 AM

## 2021-08-10 NOTE — Progress Notes (Addendum)
PROGRESS NOTE    Felicia Sellers  GYF:[id number] DOB: 1941-03-24 DOA: 08/07/2021 PCP: [facility]    Assessment & Plan:   Principal Problem:   COPD with acute exacerbation ([place]) Active Problems:   Chronic hyponatremia   Personal history of lung cancer   Acute on chronic respiratory failure with hypoxia (HCC)   Pleural effusion on left   Chronic diastolic CHF (congestive heart failure) (HCC)   Tobacco dependence   Alcohol abuse   Aortic atherosclerosis (HCC)   Emphysema (HCC)   Unspecified atrial fibrillation (HCC)   Lung mass   Aortic aneurysm (HCC)   Ovarian mass   Protein-calorie malnutrition, severe   COPD exacerbation: w/ sputum cx growing acinetobacter. Continue on abxs (amoxicillin, bactrim), steroids, bronchodilators & continue on supplemental oxygen and wean as tolerated. Pulmon following & recs apprec . Acute hypoxic respiratory failure: secondary to severe COPD exacerbation complicated by large left pleural effusion, underlying emphysema. S/p thoracentesis on 10/17. Weaned off of BiPAP  Emphysema: as managed above    Left pleural effusion: s/p thoracentesis on 10/17   A. fib: nonspecified. New dx last admission. Continue on metoprolol and lovenox   Alcohol abuse: continue on CIWA protocol. Alcohol cessation counseling    Severe protein-calorie malnutrition: continue on nutritional supplements    Lung mass: managed by heme/onco as an outpatient. Plans for repeat CT as an outpatient    Aortic atherosclerosis: continue on statin    Chronic diastolic CHF: w/ mild pedal edema but no evidence of pulmonary edema or decompensation. Continue on metoprolol    Chronic hyponatremia: likely secondary to lung disease   Ovarian mass: followed by Dr. 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 Has been referred by Dr. Janese Banks to vascular surgery.   Tobacco dependence: smoking cessation  counseling    Hx of right lung cancer: s/p lobectomy per chart   DVT prophylaxis: lovenox  Code Status: DNR Family Communication: discussed pt's care w/ pt's nephew, [name], and answered his questions  Disposition Plan: likely d/c home w/ HH   Level of care: Progressive Cardiac   Status is: Inpatient  Remains inpatient appropriate because: respiratory status needs to be improved more   Consultants:  Pulmon   Procedures:   Antimicrobials: amox, bactrim    Subjective: Pt c/o shortness of breath   Objective: Vitals:   08/10/21 0455 08/10/21 0810 08/10/21 0818 08/10/21 1058  BP: 138/85  137/90 129/65  Pulse: 100  80 (!) 105  Resp: 18  18 18   Temp: 98.2 F (36.8 C)   97.6 F (36.4 C)  TempSrc:    Oral  SpO2: 96% 92% 96% 97%  Weight:      Height:        Intake/Output Summary (Last 24 hours) at 08/10/2021 1308 Last data filed at 08/10/2021 1026 Gross per 24 hour  Intake 480 ml  Output 1100 ml  Net -620 ml   Filed Weights   08/07/21 0424 08/09/21 1431  Weight: 53.5 kg 51.6 kg    Examination:  General exam: Appears calm and comfortable. Frail appearing  Respiratory system: Course breath sounds b/l  Cardiovascular system: S1 & S2 +. No rubs, gallops or clicks.  Gastrointestinal system: Abdomen is nondistended, soft and nontender. Normal bowel sounds heard. Central nervous system: Alert and oriented. Moves all extremities  Psychiatry: Judgement and insight appear normal. Mood & affect appropriate.     Data Reviewed: I have  personally reviewed following labs and imaging studies  CBC: Recent Labs  Lab 08/07/21 0435 08/08/21 0651  WBC 7.4 5.4  NEUTROABS 5.1  --   HGB 11.5* 12.3  HCT 33.7* 34.6*  MCV 96.0 93.5  PLT 255 542   Basic Metabolic Panel: Recent Labs  Lab 08/07/21 0435 08/08/21 0651  NA 130* 129*  K 4.1 3.8  CL 96* 91*  CO2 26 28  GLUCOSE 110* 130*  BUN 17 18  CREATININE 0.67 0.75  CALCIUM 8.8* 8.5*   GFR: Estimated Creatinine  Clearance: 44.2 mL/min (by C-G formula based on SCr of 0.75 mg/dL). Liver Function Tests: No results for input(s): AST, ALT, ALKPHOS, BILITOT, PROT, ALBUMIN in the last 168 hours. No results for input(s): LIPASE, AMYLASE in the last 168 hours. No results for input(s): AMMONIA in the last 168 hours. Coagulation Profile: No results for input(s): INR, PROTIME in the last 168 hours. Cardiac Enzymes: No results for input(s): CKTOTAL, CKMB, CKMBINDEX, TROPONINI in the last 168 hours. BNP (last 3 results) No results for input(s): PROBNP in the last 8760 hours. HbA1C: No results for input(s): HGBA1C in the last 72 hours. CBG: No results for input(s): GLUCAP in the last 168 hours. Lipid Profile: No results for input(s): CHOL, HDL, LDLCALC, TRIG, CHOLHDL, LDLDIRECT in the last 72 hours. Thyroid Function Tests: No results for input(s): TSH, T4TOTAL, FREET4, T3FREE, THYROIDAB in the last 72 hours. Anemia Panel: No results for input(s): VITAMINB12, FOLATE, FERRITIN, TIBC, IRON, RETICCTPCT in the last 72 hours. Sepsis Labs: Recent Labs  Lab 08/07/21 0435 08/07/21 0436 08/08/21 0651  PROCALCITON <0.10  --  <0.10  LATICACIDVEN  --  1.4  --     Recent Results (from the past 240 hour(s))  Resp Panel by RT-PCR (Flu A&B, Covid) Nasopharyngeal Swab     Status: None   Collection Time: 08/07/21  4:13 AM   Specimen: Nasopharyngeal Swab; Nasopharyngeal(NP) swabs in vial transport medium  Result Value Ref Range Status   SARS Coronavirus 2 by RT PCR NEGATIVE NEGATIVE Final    Comment: (NOTE) SARS-CoV-2 target nucleic acids are NOT DETECTED.  The SARS-CoV-2 RNA is generally detectable in upper respiratory specimens during the acute phase of infection. The lowest concentration of SARS-CoV-2 viral copies this assay can detect is 138 copies/mL. A negative result does not preclude SARS-Cov-2 infection and should not be used as the sole basis for treatment or other patient management decisions. A  negative result may occur with  improper specimen collection/handling, submission of specimen other than nasopharyngeal swab, presence of viral mutation(s) within the areas targeted by this assay, and inadequate number of viral copies(<138 copies/mL). A negative result must be combined with clinical observations, patient history, and epidemiological information. The expected result is Negative.  Fact Sheet for Patients:  EntrepreneurPulse.com.au  Fact Sheet for Healthcare Providers:  IncredibleEmployment.be  This test is no t yet approved or cleared by the [place] FDA and  has been authorized for detection and/or diagnosis of SARS-CoV-2 by FDA under an Emergency Use Authorization (EUA). This EUA will remain  in effect (meaning this test can be used) for the duration of the COVID-19 declaration under Section 564(b)(1) of the Act, 21 U.S.C.section 360bbb-3(b)(1), unless the authorization is terminated  or revoked sooner.       Influenza A by PCR NEGATIVE NEGATIVE Final   Influenza B by PCR NEGATIVE NEGATIVE Final    Comment: (NOTE) The Xpert Xpress SARS-CoV-2/FLU/RSV plus assay is intended as an aid in the diagnosis  of influenza from Nasopharyngeal swab specimens and should not be used as a sole basis for treatment. Nasal washings and aspirates are unacceptable for Xpert Xpress SARS-CoV-2/FLU/RSV testing.  Fact Sheet for Patients: EntrepreneurPulse.com.au  Fact Sheet for Healthcare Providers: IncredibleEmployment.be  This test is not yet approved or cleared by the [place] FDA and has been authorized for detection and/or diagnosis of SARS-CoV-2 by FDA under an Emergency Use Authorization (EUA). This EUA will remain in effect (meaning this test can be used) for the duration of the COVID-19 declaration under Section 564(b)(1) of the Act, 21 U.S.C. section 360bbb-3(b)(1), unless the authorization  is terminated or revoked.  Performed at [facility], [address] [place], [place] [address]   Culture, blood (Routine X 2) w Reflex to ID Panel     Status: None (Preliminary result)   Collection Time: 08/07/21  4:36 AM   Specimen: BLOOD RIGHT HAND  Result Value Ref Range Status   Specimen Description BLOOD RIGHT HAND  Final   Special Requests   Final    BOTTLES DRAWN AEROBIC AND ANAEROBIC Blood Culture results may not be optimal due to an inadequate volume of blood received in culture bottles   Culture   Final    NO GROWTH 3 DAYS Performed at [facility], [address], [place], [place] [address]    Report Status PENDING  Incomplete  Culture, blood (Routine X 2) w Reflex to ID Panel     Status: None (Preliminary result)   Collection Time: 08/07/21  5:07 AM   Specimen: BLOOD RIGHT FOREARM  Result Value Ref Range Status   Specimen Description BLOOD RIGHT FOREARM  Final   Special Requests   Final    BOTTLES DRAWN AEROBIC AND ANAEROBIC Blood Culture adequate volume   Culture   Final    NO GROWTH 3 DAYS Performed at [facility], [address] [place], [place] [address]    Report Status PENDING  Incomplete  Expectorated Sputum Assessment w Gram Stain, Rflx to Resp Cult     Status: None   Collection Time: 08/07/21  8:32 PM   Specimen: Sputum  Result Value Ref Range Status   Specimen Description SPUTUM  Final   Special Requests NONE  Final   Sputum evaluation   Final    THIS SPECIMEN IS ACCEPTABLE FOR SPUTUM CULTURE Performed at [facility], [address] [place], [place] [address]    Report Status 08/07/2021 FINAL  Final  Culture, Respiratory w Gram Stain     Status: None   Collection Time: 08/07/21  8:32 PM   Specimen: SPU  Result Value Ref Range Status   Specimen Description   Final    SPUTUM Performed at [facility], [address], [place], [place] [address]    Special Requests   Final    NONE Reflexed from  [id number] Performed at [facility], [address] [place], [place] [address]    Gram Stain   Final    MODERATE WBC PRESENT,BOTH PMN AND MONONUCLEAR RARE YEAST FEW GRAM POSITIVE COCCI IN PAIRS FEW GRAM VARIABLE ROD    Culture   Final    FEW ACINETOBACTER BAUMANNII MODERATE DIPHTHEROIDS(CORYNEBACTERIUM SPECIES) Standardized susceptibility testing for this organism is not available. Performed at [facility], [place] [address] [place], [place] [address]    Report Status 08/10/2021 FINAL  Final   Organism ID, Bacteria ACINETOBACTER BAUMANNII  Final      Susceptibility   Acinetobacter  baumannii - MIC*    CEFTAZIDIME 4 SENSITIVE Sensitive     CIPROFLOXACIN >=4 RESISTANT Resistant     GENTAMICIN 2 SENSITIVE Sensitive     IMIPENEM <=0.25 SENSITIVE Sensitive     PIP/TAZO <=4 SENSITIVE Sensitive     TRIMETH/SULFA <=20 SENSITIVE Sensitive     AMPICILLIN/SULBACTAM <=2 SENSITIVE Sensitive     * FEW ACINETOBACTER BAUMANNII  Body fluid culture w Gram Stain     Status: None (Preliminary result)   Collection Time: 08/08/21  9:45 AM   Specimen: PATH Cytology Pleural fluid  Result Value Ref Range Status   Specimen Description   Final    PLEURAL Performed at [facility], [address], [place], [place] [address]    Special Requests   Final    PLEURAL Performed at [facility], [address] [place], [place] [address]    Gram Stain   Final    WBC PRESENT, PREDOMINANTLY MONONUCLEAR NO ORGANISMS SEEN CYTOSPIN SMEAR    Culture   Final    NO GROWTH 2 DAYS Performed at [facility], [place] [address], [place], [place] [address]    Report Status PENDING  Incomplete         Radiology Studies: No results found.      Scheduled Meds:  amoxicillin-clavulanate  1 tablet Oral Q8H   atorvastatin  40 mg Oral QHS   docusate sodium  100 mg Oral BID   enoxaparin (LOVENOX) injection  40 mg Subcutaneous Q24H   feeding supplement  237 mL Oral TID BM    folic acid  1 mg Oral Daily   gabapentin  300 mg Oral TID   ipratropium-albuterol  3 mL Nebulization Q6H   methylPREDNISolone (SOLU-MEDROL) injection  40 mg Intravenous Daily   metoprolol succinate  25 mg Oral Daily   multivitamin with minerals  1 tablet Oral Daily   nicotine  14 mg Transdermal Daily   sodium chloride flush  3 mL Intravenous Q12H   sulfamethoxazole-trimethoprim  1 tablet Oral Q12H   thiamine  100 mg Oral Daily   Or   thiamine  100 mg Intravenous Daily   Continuous Infusions:   LOS: 3 days    Time spent: 35 mins     Wyvonnia Dusky, MD [facility] Pager [phone]  If 7PM-7AM, please contact night-coverage 08/10/2021, 1:08 PM

## 2021-08-10 NOTE — Consult Note (Signed)
Consultation Note Date: 08/10/2021   Patient Name: Felicia Sellers  DOB: 01-Mar-1941  MRN: [id number]  Age / Sex: 80 y.o., female  PCP: [facility] Referring Physician: Wyvonnia Dusky, MD  Reason for Consultation: Establishing goals of care  HPI/Patient Profile: Ciclaly Mulcahey is a 80 y.o. female with medical history significant of lung cancer s/p lobectomy; chronic diastolic CHF; COPD; CAD; and HTN presenting with SOB.  She reports that she has been at [facility] rehab since her last hospitalization and has been very unhappy there; her nephew is collecting her things to help her return home now.  She started feeling SOB with coarse but dry cough about 3 days ago.  She was previously hospitalized with PNA and stopped smoking just prior to that admission.  No fever.  +LE edema but chronic in nature.  She reports that she is not on home O2.    Clinical Assessment and Goals of Care: Patient is resting in bed. She states her nephew stays with her at times.   She states she lives with her 100 pound Rotweiler, and is successful with caring for him and herself. She states going up and down stairs is difficult because of breathing. She tells me she feels she will not need help for a while, and states "I know how to pick up the phone if I do."   Attempted to broach [place], but with asking who her surrogate decision maker would be, she states she has already been asked this, and "I'm not making any decisions right now." She states "I want to go home, and then I'll see how it goes and make decisions from there". She indicates she expects a SW to come to see her in her home.   Would recommend outpatient palliative if she will accept  the service.     SUMMARY OF RECOMMENDATIONS   Recommend outpatient palliative  Prognosis:  Poor overall        Primary Diagnoses: Present on Admission:  COPD with acute  exacerbation (HCC)  Pleural effusion on left  Acute on chronic respiratory failure with hypoxia (HCC)  Chronic hyponatremia   I have reviewed the medical record, interviewed the patient and family, and examined the patient. The following aspects are pertinent.  Past Medical History:  Diagnosis Date   Aortic aneurysm ([place]) 08/08/2021   Aortic atherosclerosis ([place]) 08/08/2021   Cancer ([place])    right lung removed - lung cancer 1/3 lung removed    CHF (congestive heart failure) (HCC)    Chronic diastolic CHF (congestive heart failure) ([place]) 08/08/2021   Chronic hyponatremia 11/04/2015   Overview:  Normal serum oslmolality   COPD (chronic obstructive pulmonary disease) (HCC)    Coronary artery disease    Emphysema ([place]) 08/08/2021   History of stomach ulcers    Hypertension    Lung mass 08/08/2021   Ovarian mass 08/08/2021   Pneumonia    Unspecified atrial fibrillation ([place]) 08/08/2021   Social History   Socioeconomic History   Marital status: Single  Spouse name: Not on file   Number of children: Not on file   Years of education: Not on file   Highest education level: Not on file  Occupational History   Not on file  Tobacco Use   Smoking status: Former    Packs/day: 1.00    Years: 66.00    Pack years: 66.00    Types: Cigarettes    Quit date: 06/26/2020    Years since quitting: 1.1   Smokeless tobacco: Never  Vaping Use   Vaping Use: Never used  Substance and Sexual Activity   Alcohol use: Not Currently    Comment: use to drink alot ( vodka) 6 months ago   Drug use: Never   Sexual activity: Not Currently  Other Topics Concern   Not on file  Social History Narrative   Not on file   Social Determinants of Health   Financial Resource Strain: Not on file  Food Insecurity: Not on file  Transportation Needs: Not on file  Physical Activity: Not on file  Stress: Not on file  Social Connections: Not on file   Family History  Problem Relation Age of Onset    Breast cancer Neg Hx    Scheduled Meds:  amoxicillin-clavulanate  1 tablet Oral Q8H   atorvastatin  40 mg Oral QHS   docusate sodium  100 mg Oral BID   enoxaparin (LOVENOX) injection  40 mg Subcutaneous Q24H   feeding supplement  237 mL Oral TID BM   folic acid  1 mg Oral Daily   gabapentin  300 mg Oral TID   ipratropium-albuterol  3 mL Nebulization Q6H   methylPREDNISolone (SOLU-MEDROL) injection  40 mg Intravenous Daily   metoprolol succinate  25 mg Oral Daily   multivitamin with minerals  1 tablet Oral Daily   nicotine  14 mg Transdermal Daily   sodium chloride flush  3 mL Intravenous Q12H   sulfamethoxazole-trimethoprim  1 tablet Oral Q12H   thiamine  100 mg Oral Daily   Or   thiamine  100 mg Intravenous Daily   Continuous Infusions: PRN Meds:.acetaminophen **OR** acetaminophen, albuterol, bisacodyl, guaiFENesin, hydrALAZINE, HYDROcodone-acetaminophen, LORazepam **OR** LORazepam, ondansetron **OR** ondansetron (ZOFRAN) IV, polyethylene glycol, zolpidem Medications Prior to Admission:  Prior to Admission medications   Medication Sig Start Date End Date Taking? Authorizing Provider  Albuterol Sulfate (PROAIR RESPICLICK) 191 (90 Base) MCG/ACT AEPB Inhale 2 puffs into the lungs every 6 (six) hours as needed. 06/28/20  Yes Danford, Suann Larry, MD  amLODipine (NORVASC) 10 MG tablet Take 10 mg by mouth daily.   Yes [provider]  atorvastatin (LIPITOR) 40 MG tablet Take 40 mg by mouth at bedtime.   Yes [provider]  calcium carbonate (OS-CAL - DOSED IN MG OF ELEMENTAL CALCIUM) 1250 (500 Ca) MG tablet Take 1 tablet by mouth 2 (two) times daily with a meal.    Yes [provider]  Cholecalciferol 1000 units capsule Take 1,000 Units by mouth daily.   Yes [provider]  diclofenac Sodium (VOLTAREN) 1 % GEL Apply topically 2 (two) times daily. Apply to upper back and shoulders every morning and at bedtime related to muscle weakness.   Yes [provider]  docusate sodium (COLACE) 100 MG capsule Take 100 mg by mouth daily.   Yes [provider]  gabapentin (NEURONTIN) 300 MG capsule Take 300 mg by mouth 3 (three) times daily.   Yes [provider]  ipratropium (ATROVENT) 0.02 % nebulizer solution Take 0.5  mg by nebulization every 12 (twelve) hours as needed for wheezing or shortness of breath.   Yes [provider]  lisinopril (ZESTRIL) 10 MG tablet Take 10 mg by mouth daily.    Yes [provider]  loperamide (IMODIUM) 2 MG capsule Take 4 mg by mouth as needed for diarrhea or loose stools.   Yes [provider]  metoprolol succinate (TOPROL-XL) 25 MG 24 hr tablet Take 25 mg by mouth daily.   Yes [provider]  sodium chloride 1 g tablet Take 2 g by mouth 3 (three) times daily.   Yes [provider]   Allergies  Allergen Reactions   Aspirin Other (See Comments)    High risk of bleeding   Review of Systems  Respiratory:  Positive for shortness of breath.    Physical Exam Pulmonary:     Effort: Pulmonary effort is normal.  Neurological:     Mental Status: She is alert.    Vital Signs: BP 129/65   Pulse (!) 105   Temp 97.6 F (36.4 C) (Oral)   Resp 18   Ht 5\' 3"  (1.6 m)   Wt 51.6 kg   SpO2 97%   BMI 20.15 kg/m  Pain Scale: 0-10   Pain Score: 0-No pain   SpO2: SpO2: 97 % O2 Device:SpO2: 97 % O2 Flow Rate: .O2 Flow Rate (L/min): 4 L/min  IO: Intake/output summary:  Intake/Output Summary (Last 24 hours) at 08/10/2021 1437 Last data filed at 08/10/2021 1422 Gross per 24 hour  Intake 720 ml  Output 1100 ml  Net -380 ml    LBM:   Baseline Weight: Weight: 53.5 kg Most recent weight: Weight: 51.6 kg        Time In: 2:15 Time Out: 2:45 Time Total: 30 min Greater than 50%  of this time was spent counseling and coordinating care related to the above assessment and plan.  Signed by: Asencion Gowda, NP   Please contact Palliative Medicine  Team phone at [phone] for questions and concerns.  For individual provider: See Shea Evans

## 2021-08-10 NOTE — Progress Notes (Signed)
OT Cancellation Note  Patient Details Name: Felicia Sellers MRN: [id number] DOB: [birth year]   Cancelled Treatment:    Reason Eval/Treat Not Completed: Patient declined, no reason specified. Upon arrival to room, pt initially declining OT tx after working with PT this AM. Following MOD encouragement and education on benefits of OOB mobility, pt agreeable to OT tx, however lunch tray arrived shortly after and pt declined to participate in OT tx. OT to re-attempt at later time/date as able.  Fredirick Maudlin, OTR/L [place]

## 2021-08-10 NOTE — Progress Notes (Signed)
Pulmonary Medicine          Date: 08/10/2021,   MRN# [id number] Felicia Sellers [birth year]     AdmissionWeight: 53.5 kg                 CurrentWeight: 51.6 kg  Refferring physician: Dr Sarajane Jews    CHIEF COMPLAINT:   Acute on chronic hypoxemic respiratory failure   HISTORY OF PRESENT ILLNESS   This is a 80 yo F with hx of Aortic aneurysm, Right lung cancer s/p resection , CHF, chronic hyponatremia, COPD with chronic hypoxemia on 3L/min [place] supplemental O2 at home. She has hx of gastric ulceration, essential HTN, and ovarian mass, who came in after acute on sent SOB and was admitted with COPD exacerbation. She was noted to be hypoxemic in the 70s.  She had chest imaging done and was noted to have large left sided pleural effusion. This was drained and fluid was cloudy straw colored appx 700cc.  I was present during procedure and there were no complications noted. Fluid profile thus far consistent with lymphocyte predominant transudate. PCCM consultation for further evaluation and management.   08/09/21- patient shares she is feeling better and is now on 3L/min [place]. She is speaking in full sentences and is normoxic but does report dyspnea. Plan to continue current regimen. Dcd Cefepime and started augmetin 500/125 tid with solumedrol reduced to 40mg  daily.    08/10/21-  patient is stable smiling during interview. She is getting close to baseline.   PAST MEDICAL HISTORY   Past Medical History:  Diagnosis Date   Aortic aneurysm ([place]) 08/08/2021   Aortic atherosclerosis ([place]) 08/08/2021   Cancer ([place])    right lung removed - lung cancer 1/3 lung removed    CHF (congestive heart failure) (HCC)    Chronic diastolic CHF (congestive heart failure) ([place]) 08/08/2021   Chronic hyponatremia 11/04/2015   Overview:  Normal serum oslmolality   COPD (chronic obstructive pulmonary disease) (HCC)    Coronary artery disease    Emphysema ([place]) 08/08/2021   History of stomach ulcers     Hypertension    Lung mass 08/08/2021   Ovarian mass 08/08/2021   Pneumonia    Unspecified atrial fibrillation ([place]) 08/08/2021     SURGICAL HISTORY   Past Surgical History:  Procedure Laterality Date   LUNG REMOVAL, PARTIAL Left      FAMILY HISTORY   Family History  Problem Relation Age of Onset   Breast cancer Neg Hx      SOCIAL HISTORY   Social History   Tobacco Use   Smoking status: Former    Packs/day: 1.00    Years: 66.00    Pack years: 66.00    Types: Cigarettes    Quit date: 06/26/2020    Years since quitting: 1.1   Smokeless tobacco: Never  Vaping Use   Vaping Use: Never used  Substance Use Topics   Alcohol use: Not Currently    Comment: use to drink alot ( vodka) 6 months ago   Drug use: Never     MEDICATIONS    Home Medication:  REM   Current Medication:  Current Facility-Administered Medications:    acetaminophen (TYLENOL) tablet 650 mg, 650 mg, Oral, Q6H PRN **OR** acetaminophen (TYLENOL) suppository 650 mg, 650 mg, Rectal, Q6H PRN, Karmen Bongo, MD   albuterol (PROVENTIL) (2.5 MG/3ML) 0.083% nebulizer solution 2.5 mg, 2.5 mg, Nebulization, Q2H PRN, Karmen Bongo, MD, 2.5 mg at 08/08/21 0810   amoxicillin-clavulanate (AUGMENTIN) 500-125 MG  per tablet 500 mg, 1 tablet, Oral, Q8H, Emsley Custer, MD, 500 mg at 08/10/21 0603   atorvastatin (LIPITOR) tablet 40 mg, 40 mg, Oral, QHS, Karmen Bongo, MD, 40 mg at 08/09/21 2139   bisacodyl (DULCOLAX) EC tablet 5 mg, 5 mg, Oral, Daily PRN, Karmen Bongo, MD   docusate sodium (COLACE) capsule 100 mg, 100 mg, Oral, BID, Karmen Bongo, MD, 100 mg at 08/10/21 0953   enoxaparin (LOVENOX) injection 40 mg, 40 mg, Subcutaneous, Q24H, Karmen Bongo, MD, 40 mg at 08/09/21 2140   feeding supplement (ENSURE ENLIVE / ENSURE PLUS) liquid 237 mL, 237 mL, Oral, TID BM, Samuella Cota, MD, 237 mL at 57/84/69 6295   folic acid (FOLVITE) tablet 1 mg, 1 mg, Oral, Daily, Samuella Cota, MD, 1 mg at  08/10/21 0954   gabapentin (NEURONTIN) capsule 300 mg, 300 mg, Oral, TID, Karmen Bongo, MD, 300 mg at 08/10/21 0954   guaiFENesin (MUCINEX) 12 hr tablet 600 mg, 600 mg, Oral, BID PRN, Karmen Bongo, MD, 600 mg at 08/07/21 1429   hydrALAZINE (APRESOLINE) injection 5 mg, 5 mg, Intravenous, Q4H PRN, Karmen Bongo, MD   HYDROcodone-acetaminophen (NORCO/VICODIN) 5-325 MG per tablet 1-2 tablet, 1-2 tablet, Oral, Q4H PRN, Karmen Bongo, MD   ipratropium-albuterol (DUONEB) 0.5-2.5 (3) MG/3ML nebulizer solution 3 mL, 3 mL, Nebulization, Q6H, Karmen Bongo, MD, 3 mL at 08/10/21 0810   LORazepam (ATIVAN) tablet 1-4 mg, 1-4 mg, Oral, Q1H PRN **OR** LORazepam (ATIVAN) injection 1-4 mg, 1-4 mg, Intravenous, Q1H PRN, Samuella Cota, MD   methylPREDNISolone sodium succinate (SOLU-MEDROL) 40 mg/mL injection 40 mg, 40 mg, Intravenous, Daily, Ottie Glazier, MD, 40 mg at 08/10/21 0953   metoprolol succinate (TOPROL-XL) 24 hr tablet 25 mg, 25 mg, Oral, Daily, Karmen Bongo, MD, 25 mg at 08/10/21 2841   multivitamin with minerals tablet 1 tablet, 1 tablet, Oral, Daily, Samuella Cota, MD, 1 tablet at 08/10/21 3244   nicotine (NICODERM CQ - dosed in mg/24 hours) patch 14 mg, 14 mg, Transdermal, Daily, Karmen Bongo, MD, 14 mg at 08/10/21 0953   ondansetron (ZOFRAN) tablet 4 mg, 4 mg, Oral, Q6H PRN **OR** ondansetron (ZOFRAN) injection 4 mg, 4 mg, Intravenous, Q6H PRN, Karmen Bongo, MD   polyethylene glycol (MIRALAX / GLYCOLAX) packet 17 g, 17 g, Oral, Daily PRN, Karmen Bongo, MD   sodium chloride flush (NS) 0.9 % injection 3 mL, 3 mL, Intravenous, Q12H, Karmen Bongo, MD, 3 mL at 08/10/21 0955   thiamine tablet 100 mg, 100 mg, Oral, Daily, 100 mg at 08/10/21 0954 **OR** thiamine (B-1) injection 100 mg, 100 mg, Intravenous, Daily, Samuella Cota, MD   zolpidem Lorrin Mais) tablet 5 mg, 5 mg, Oral, QHS PRN, Karmen Bongo, MD, 5 mg at 08/09/21 2140    ALLERGIES   Aspirin     REVIEW  OF SYSTEMS    Review of Systems:  Gen:  Denies  fever, sweats, chills weigh loss  HEENT: Denies blurred vision, double vision, ear pain, eye pain, hearing loss, nose bleeds, sore throat Cardiac:  No dizziness, chest pain or heaviness, chest tightness,edema Resp:   Denies cough or sputum porduction, shortness of breath,wheezing, hemoptysis,  Gi: Denies swallowing difficulty, stomach pain, nausea or vomiting, diarrhea, constipation, bowel incontinence Gu:  Denies bladder incontinence, burning urine Ext:   Denies Joint pain, stiffness or swelling Skin: Denies  skin rash, easy bruising or bleeding or hives Endoc:  Denies polyuria, polydipsia , polyphagia or weight change Psych:   Denies depression, insomnia or hallucinations  Other:  All other systems negative   VS: BP 137/90   Pulse 80   Temp 98.2 F (36.8 C)   Resp 18   Ht 5\' 3"  (1.6 m)   Wt 51.6 kg   SpO2 96%   BMI 20.15 kg/m      PHYSICAL EXAM    GENERAL:NAD, no fevers, chills, no weakness no fatigue HEAD: Normocephalic, atraumatic.  EYES: Pupils equal, round, reactive to light. Extraocular muscles intact. No scleral icterus.  MOUTH: Moist mucosal membrane. Dentition intact. No abscess noted.  EAR, NOSE, THROAT: Clear without exudates. No external lesions.  NECK: Supple. No thyromegaly. No nodules. No JVD.  PULMONARY: Decreased air entry b/l worse on left lung  CARDIOVASCULAR: S1 and S2. Regular rate and rhythm. No murmurs, rubs, or gallops. No edema. Pedal pulses 2+ bilaterally.  GASTROINTESTINAL: Soft, nontender, nondistended. No masses. Positive bowel sounds. No hepatosplenomegaly.  MUSCULOSKELETAL: No swelling, clubbing, or edema. Range of motion full in all extremities.  NEUROLOGIC: Cranial nerves II through XII are intact. No gross focal neurological deficits. Sensation intact. Reflexes intact.  SKIN: No ulceration, lesions, rashes, or cyanosis. Skin warm and dry. Turgor intact.  PSYCHIATRIC: Mood, affect within  normal limits. The patient is awake, alert and oriented x 3. Insight, judgment intact.       IMAGING      CT CHEST W CONTRAST  Result Date: 08/07/2021 CLINICAL DATA:  COPD exacerbation EXAM: CT CHEST WITH CONTRAST TECHNIQUE: Multidetector CT imaging of the chest was performed during intravenous contrast administration. CONTRAST:  70mL OMNIPAQUE IOHEXOL 300 MG/ML  SOLN COMPARISON:  07/09/2021 FINDINGS: Cardiovascular: Cardiomegaly. No pericardial effusion. Advanced atherosclerosis of the aorta with irregular plaque diffusely and the descending aneurysm which measures 4.9 Cm in diameter. No pulmonary artery filling defect. Mediastinum/Nodes: No adenopathy mass. Mild food retention of the esophagus. Lungs/Pleura: Moderate to large left pleural effusion which is simple density with extensive atelectasis. Significant clearing right-sided pneumonia since prior CT. Postoperative right upper lobe. Improved nodularity on prior which is also attributed to infection. Emphysema. Upper Abdomen: Extensive atherosclerosis. Hepatic venous reflux attributed to right heart failure Musculoskeletal: Exaggerated kyphosis.  No acute finding IMPRESSION: 1. Moderate to large simple left pleural effusion with multi segment atelectasis. 2. Essentially resolved pneumonia seen on prior CT. 3. Advanced atherosclerosis especially of the aorta with descending aneurysm measuring up to 4.9 cm in diameter. Follow-up depends on true morbidity/life expectancy. Aortic Atherosclerosis (ICD10-I70.0) and Emphysema (ICD10-J43.9). Electronically Signed   By: Jorje Guild M.D.   On: 08/07/2021 10:22   NM PET Image Initial (PI) Skull Base To Thigh  Result Date: 07/26/2021 CLINICAL DATA:  Initial treatment strategy for right lung mass. EXAM: NUCLEAR MEDICINE PET SKULL BASE TO THIGH TECHNIQUE: 6.1 mCi F-18 FDG was injected intravenously. Full-ring PET imaging was performed from the skull base to thigh after the radiotracer. CT data was  obtained and used for attenuation correction and anatomic localization. Fasting blood glucose: 78 mg/dl COMPARISON:  CT chest 07/09/2021 FINDINGS: Mediastinal blood pool activity: SUV max 1.9 Liver activity: SUV max NA NECK: No significant abnormal hypermetabolic activity in this region. Incidental CT findings: Bilateral common carotid atherosclerotic calcifications. CHEST: Substantial clearing of prior airspace opacity with mild residual peripheral nodularity in the remaining right lung, particularly peripherally in the right lower lobe, which warrants surveillance but is currently not of high suspicion for active malignancy. This clustered peripheral nodularity has a maximum SUV up to 1.9. No central obstructing hypermetabolic lesion is identified. Right lower paratracheal lymph  node 1.2 cm in short axis with maximum SUV 2.4, previous maximum SUV 3.5. Incidental CT findings: Centrilobular emphysema. Coronary, aortic arch, and branch vessel atherosclerotic vascular disease. 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 Aneurysm of the descending thoracic aorta 5.1 cm in diameter. Moderate cardiomegaly. Dense mitral valve calcification. Trace pericardial effusion posteriorly. Small bilateral pleural effusions with associated passive atelectasis. Postoperative findings in the right lung. There is also some mild peripheral scattered nodularity in the left lung which is moderately improved compared to 07/09/2021. ABDOMEN/PELVIS: Complex cystic lesion in the left adnexa measuring 11.3 by 6.3 cm on image 191 series 4 with several loculations of varying density, but photopenic on the PET data. Smaller photopenic lesion of the right ovary. Incidental CT findings: Gallbladder wall thickening, nonspecific although inflammation is not readily excluded. Extensive atherosclerosis. Sigmoid colon diverticulosis. Diffuse subcutaneous edema. SKELETON: No significant abnormal hypermetabolic activity in this region. Incidental CT  findings: Degenerative hip arthropathy, right greater than left. IMPRESSION: 1. Interval clearance of the vast majority of the airspace opacity in the right lung. There some small peripheral areas of scattered nodularity which merit chest CT surveillance but which are not substantially hypermetabolic today, and accordingly may potentially be inflammatory/infectious. Postoperative findings in the right lung. 2. Stable mild paratracheal adenopathy, although SUV has decreased compared to the prior chest CT of 08/22/2018. 3. Complex cystic lesion of the left adnexa measuring up to 11.3 cm in long axis, photopenic. Smaller lesion of the right ovary is not substantially hypermetabolic either. 4. 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 Recommend semi-annual imaging followup by CTA or MRA and referral to cardiothoracic surgery if not already obtained. This recommendation follows 2010 ACCF/AHA/AATS/ACR/ASA/SCA/SCAI/SIR/STS/SVM Guidelines for the Diagnosis and Management of Patients With Thoracic Aortic Disease. Circulation. 2010; 121: N462-V03. Aortic aneurysm NOS (ICD10-I71.9) 5. Other imaging findings of potential clinical significance: Aortic Atherosclerosis (ICD10-I70.0) and Emphysema (ICD10-J43.9). Coronary atherosclerosis and extensive abdominal atherosclerosis. Moderate cardiomegaly. Dense mitral valve calcifications. Small bilateral pleural effusions along with subcutaneous edema suggesting mild third spacing of fluid. Nonspecific wall thickening in the gallbladder. Sigmoid colon diverticulosis. Degenerative hip arthropathy. Electronically Signed   By: Van Clines M.D.   On: 07/26/2021 07:13   DG Chest Port 1 View  Result Date: 08/08/2021 CLINICAL DATA:  Status post thoracentesis on the left side. EXAM: PORTABLE CHEST 1 VIEW COMPARISON:  08/07/2021 FINDINGS: Improved aeration in the left lower chest. Residual densities at the left lung base. Prominent  interstitial lung markings bilaterally and minimally changed. Blunting of the right costophrenic angle is suggestive for right pleural fluid. Negative for pneumothorax. Heart size remains enlarged. IMPRESSION: 1. Negative for a pneumothorax following the left thoracentesis. 2. Improved aeration in the left lower lung with residual densities at the left lung base. 3. Evidence for right pleural effusion. Electronically Signed   By: Markus Daft M.D.   On: 08/08/2021 10:38   DG Chest Portable 1 View  Result Date: 08/07/2021 CLINICAL DATA:  Increasing shortness of breath EXAM: PORTABLE CHEST 1 VIEW COMPARISON:  07/08/2021 FINDINGS: Improved right lung aeration. Worse left base aeration at least partially from pleural fluid. Small right pleural effusion. Generalized reticulonodular opacity. Chronic cardiomegaly. IMPRESSION: 1. Left more than right pleural effusion with obscured or opacified left lower lobe. 2. Significant clearance of right-sided pneumonia since chest x-ray last month. 3. Background chronic lung disease and nodularity reference recent PET. Electronically Signed   By: Jorje Guild M.D.   On:  08/07/2021 05:04   US THORACENTESIS ASP PLEURAL SPACE W/IMG GUIDE  Result Date: 08/08/2021 INDICATION: Patient with shortness of breath and large left pleural effusion request received for thoracentesis. EXAM: ULTRASOUND GUIDED DIAGNOSTIC AND THERAPEUTIC THORACENTESIS MEDICATIONS: Local 1% lidocaine only. COMPLICATIONS: None immediate. PROCEDURE: An ultrasound guided thoracentesis was thoroughly discussed with the patient and questions answered. The benefits, risks, alternatives and complications were also discussed. The patient understands and wishes to proceed with the procedure. Written consent was obtained. Ultrasound was performed to localize and mark an adequate pocket of fluid in the left chest. The area was then prepped and draped in the normal sterile fashion. 1% Lidocaine was used for local  anesthesia. Under ultrasound guidance a 6 Fr Safe-T-Centesis catheter was introduced. Thoracentesis was performed. The catheter was removed and a dressing applied. FINDINGS: A total of approximately 700 mL of clear yellow fluid was removed. Samples were sent to the laboratory as requested by the clinical team. IMPRESSION: Successful ultrasound guided left thoracentesis yielding 700 mL of pleural fluid. Read By: Tsosie Billing PA-C Electronically Signed   By: Ruthann Cancer M.D.   On: 08/08/2021 10:47      ASSESSMENT/PLAN   Acute on chronic hypoxemic respiratory failure   - currently seems to have complete atelectasis of LLL due to large left pleural effusion    - recommend incentive spirometry and flutter valve -agree with empiric tx for CAP and COPD - agree with cefepime -agree with solumedrol - currently at dose of 80mg  would reduce to 40mg  -poor prognosis in DNR patient would recommend palliative evaluation  Large left pleural effusion   - s/p aspiration of 700cc cloudly straw colored lymphocyte predominant transudate   - this is likely due to CKD/CHF and is likely the etiology    - due to hx of cancer would evaluate cytology      Thank you for allowing me to participate in the care of this patient.     Patient/Family are satisfied with care plan and all questions have been answered.  This document was prepared using Dragon voice recognition software and may include unintentional dictation errors.     Ottie Glazier, M.D.  Division of [address]

## 2021-08-10 NOTE — Progress Notes (Signed)
SATURATION QUALIFICATIONS: (This note is used to comply with regulatory documentation for home oxygen)  Patient Saturations on Room Air at Rest = 88%  Patient Saturations on Room Air while Ambulating = 85%  Patient Saturations on 4 Liters of oxygen while Ambulating = 92%  Please briefly explain why patient needs home oxygen:

## 2021-08-11 DIAGNOSIS — J9621 Acute and chronic respiratory failure with hypoxia: Secondary | ICD-10-CM | POA: Diagnosis not present

## 2021-08-11 DIAGNOSIS — J441 Chronic obstructive pulmonary disease with (acute) exacerbation: Secondary | ICD-10-CM | POA: Diagnosis not present

## 2021-08-11 DIAGNOSIS — F1010 Alcohol abuse, uncomplicated: Secondary | ICD-10-CM | POA: Diagnosis not present

## 2021-08-11 LAB — CBC
HCT: 37.9 % (ref 36.0–46.0)
Hemoglobin: 12.8 g/dL (ref 12.0–15.0)
MCH: 31.6 pg (ref 26.0–34.0)
MCHC: 33.8 g/dL (ref 30.0–36.0)
MCV: 93.6 fL (ref 80.0–100.0)
Platelets: 268 10*3/uL (ref 150–400)
RBC: 4.05 MIL/uL (ref 3.87–5.11)
RDW: 15.4 % (ref 11.5–15.5)
WBC: 10.2 10*3/uL (ref 4.0–10.5)
nRBC: 0 % (ref 0.0–0.2)

## 2021-08-11 LAB — BODY FLUID CULTURE W GRAM STAIN: Culture: NO GROWTH

## 2021-08-11 LAB — BASIC METABOLIC PANEL
Anion gap: 7 (ref 5–15)
BUN: 20 mg/dL (ref 8–23)
CO2: 30 mmol/L (ref 22–32)
Calcium: 8.4 mg/dL — ABNORMAL LOW (ref 8.9–10.3)
Chloride: 89 mmol/L — ABNORMAL LOW (ref 98–111)
Creatinine, Ser: 0.61 mg/dL (ref 0.44–1.00)
GFR, Estimated: >60 mL/min (ref >60)
Glucose, Bld: 83 mg/dL (ref 70–99)
Potassium: 4.1 mmol/L (ref 3.5–5.1)
Sodium: 126 mmol/L — ABNORMAL LOW (ref 135–145)

## 2021-08-11 MED ORDER — PREDNISONE 20 MG PO TABS: 40.0000 mg | ORAL_TABLET | Freq: Every day | ORAL | 0 refills | Status: DC

## 2021-08-11 MED ORDER — SODIUM CHLORIDE 1 G PO TABS
1.0000 g | ORAL_TABLET | Freq: Two times a day (BID) | ORAL | Status: DC
  Administered 2021-08-11 (×2): 1 g via ORAL
  Filled 2021-08-11 (×3): qty 1

## 2021-08-11 MED ORDER — SULFAMETHOXAZOLE-TRIMETHOPRIM 800-160 MG PO TABS: 1.0000 | ORAL_TABLET | Freq: Two times a day (BID) | ORAL | 0 refills | Status: DC

## 2021-08-11 MED ORDER — AMOXICILLIN-POT CLAVULANATE 500-125 MG PO TABS: 1.0000 | ORAL_TABLET | Freq: Three times a day (TID) | ORAL | 0 refills | Status: DC

## 2021-08-11 NOTE — Care Management Important Message (Signed)
Important Message  Patient Details  Name: Felicia Sellers MRN: [id number] Date of Birth: Jun 10, 1941   Medicare Important Message Given:  Yes     Dannette Barbara 08/11/2021, 2:36 PM

## 2021-08-11 NOTE — Discharge Summary (Addendum)
Physician Discharge Summary  Felicia Sellers WPV:[id number] DOB: Nov 06, 1940 DOA: 08/07/2021  PCP: [facility]  Admit date: 08/07/2021 Discharge date: 08/11/2021  Admitted From: home  Disposition:  home w/ home health   Recommendations for Outpatient Follow-up:  Follow up with PCP in 1 weeks F/u w/ pulmon, Felicia Sellers in 1-2 weeks   Home Health: yes  Equipment/Devices: 4L [place]  Discharge Condition: stable  CODE STATUS: DNR Diet recommendation: Dysphagia III: Heart Healthy  Brief/Interim Summary:  HPI was taken from Dr. Lorin Sellers: Felicia Sellers is a 80 y.o. female with medical history significant of lung cancer s/p lobectomy; chronic diastolic CHF; COPD; CAD; and HTN presenting with SOB.  She reports that she has been at [facility] rehab since her last hospitalization and has been very unhappy there; her nephew is collecting her things to help her return home now.  She started feeling SOB with coarse but dry cough about 3 days ago.  She was previously hospitalized with PNA and stopped smoking just prior to that admission.  No fever.  +LE edema but chronic in nature.  She reports that she is not on home O2.   She was last admitted from 9/16-22 with acute respiratory failure with hypoxia and was discharged on 2L home O2.  She was thought to have post-obstructive PNA with concern for primary lung cancer.  Subsequent outpatient PET scan was performed that was not diagnostic and bronch was not recommended; she is suggested to have repeat CT in 3 months.  Meanwhile, she does have a complex cystic lesion of the L adnexa and has been recommended to f/u with GYN Onc ([place]).  She also is involved with palliative care and was last seen on 10/3 with plan for f/u in 2 weeks.   Her nephew had the folks come pick up the oxygen after 2 weeks following last hospitalization.  He thinks she will need placement again.  He reports that she is continuing to smoke.  She was a "heavy alcoholic for 70 years"  and stopped at the time of last hospitalization - maybe.  He doesn't make any decisions and defers all decisions to her; he will make decisions only if she cannot.  Her nephew is happy to call back if we leave a message for him, but isn't always available to answer the phone when called.  Hospital course from Dr. Jimmye Sellers 10/19-10/20/22:  Pt was found to have COPD exacerbation & sputum cx growing acinetobacter. Pt was treated w/  IV steroids, IV abxs initially and then changed over to amoxicillin & bactrim. Pt was sent home w/ po steroids to complete the course as well. Of note, pt was unable to weaned from supplemental oxygen and was d/c home w/ 4L [place]. Pt has a poor prognosis and high risk for re-admission. For more information, please see previous progress/consult notes.   Discharge Diagnoses:  Principal Problem:   COPD with acute exacerbation ([place]) Active Problems:   Chronic hyponatremia   Personal history of lung cancer   Acute on chronic respiratory failure with hypoxia (HCC)   Pleural effusion on left   Chronic diastolic CHF (congestive heart failure) (HCC)   Tobacco dependence   Alcohol abuse   Aortic atherosclerosis (HCC)   Emphysema (HCC)   Unspecified atrial fibrillation (HCC)   Lung mass   Aortic aneurysm (HCC)   Ovarian mass   Protein-calorie malnutrition, severe   COPD exacerbation: w/ sputum cx growing acinetobacter. Continue on abxs (amoxicillin, bactrim), steroids, bronchodilators & continue on  supplemental oxygen and unable to wean off. D/c home w/ 4L [place]. Pulmon following & recs apprec . Acute hypoxic respiratory failure: secondary to severe COPD exacerbation complicated by large left pleural effusion, underlying emphysema. S/p thoracentesis on 10/17. Weaned off of BiPAP  Emphysema: as managed above    Left pleural effusion: s/p thoracentesis on 10/17   A. fib: nonspecified. New dx last admission. Continue on metoprolol, amlodipine and lovenox. Not on chronic  anticoagulation likely secondary to high fall & bleeding risk   Alcohol abuse: received alcohol cessation counseling     Severe protein-calorie malnutrition: continue on nutritional supplements    Lung mass: managed by heme/onco as an outpatient. Plans for repeat CT as an outpatient    Aortic atherosclerosis: continue on statin    Chronic diastolic CHF: w/ mild pedal edema but no evidence of pulmonary edema or decompensation. 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 Has been referred by Felicia Sellers to vascular surgery.   Tobacco dependence: smoking cessation counseling    Hx of right lung cancer: s/p lobectomy per chart   Discharge Instructions  Discharge Instructions     Diet - low sodium heart healthy   Complete by: As directed    Dysphagia III diet   Discharge instructions   Complete by: As directed    F/u w/ PCP in 1 week. F/u w/ pulmon, Felicia Sellers, in 1-2 weeks   Increase activity slowly   Complete by: As directed       Allergies as of 08/11/2021       Reactions   Aspirin Other (See Comments)   High risk of bleeding        Medication List     TAKE these medications    Albuterol Sulfate 108 (90 Base) MCG/ACT Aepb Commonly known as: PROAIR RESPICLICK Inhale 2 puffs into the lungs every 6 (six) hours as needed.   amLODipine 10 MG tablet Commonly known as: NORVASC Take 10 mg by mouth daily.   amoxicillin-clavulanate 500-125 MG tablet Commonly known as: AUGMENTIN Take 1 tablet (500 mg total) by mouth every 8 (eight) hours for 2 days.   atorvastatin 40 MG tablet Commonly known as: LIPITOR Take 40 mg by mouth at bedtime.   calcium carbonate 1250 (500 Ca) MG tablet Commonly known as: OS-CAL - dosed in mg of elemental calcium Take 1 tablet by mouth 2 (two) times daily  with a meal.   Cholecalciferol 25 MCG (1000 UT) capsule Take 1,000 Units by mouth daily.   diclofenac Sodium 1 % Gel Commonly known as: VOLTAREN Apply topically 2 (two) times daily. Apply to upper back and shoulders every morning and at bedtime related to muscle weakness.   docusate sodium 100 MG capsule Commonly known as: COLACE Take 100 mg by mouth daily.   gabapentin 300 MG capsule Commonly known as: NEURONTIN Take 300 mg by mouth 3 (three) times daily.   ipratropium 0.02 % nebulizer solution Commonly known as: ATROVENT Take 0.5 mg by nebulization every 12 (twelve) hours as needed for wheezing or shortness of breath.   lisinopril 10 MG tablet Commonly known as: ZESTRIL Take 10 mg by mouth daily.   loperamide 2 MG capsule Commonly known as: IMODIUM Take 4 mg by mouth as needed for diarrhea or loose stools.   metoprolol succinate 25  MG 24 hr tablet Commonly known as: TOPROL-XL Take 25 mg by mouth daily.   predniSONE 20 MG tablet Commonly known as: DELTASONE Take 2 tablets (40 mg total) by mouth daily for 5 days.   sodium chloride 1 g tablet Take 2 g by mouth 3 (three) times daily.   sulfamethoxazole-trimethoprim 800-160 MG tablet Commonly known as: BACTRIM DS Take 1 tablet by mouth every 12 (twelve) hours for 4 days.               Durable Medical Equipment  (From admission, onward)           Start     Ordered   08/11/21 0730  For home use only DME oxygen  Once       Question Answer Comment  Length of Need Lifetime   Mode or (Route) Nasal cannula   Liters per Minute 4   Frequency Continuous (stationary and portable oxygen unit needed)   Oxygen conserving device Yes   Oxygen delivery system Gas      08/11/21 0730            Allergies  Allergen Reactions   Aspirin Other (See Comments)    High risk of bleeding    Consultations: Pulmon    Procedures/Studies: CT CHEST W CONTRAST  Result Date: 08/07/2021 CLINICAL DATA:  COPD  exacerbation EXAM: CT CHEST WITH CONTRAST TECHNIQUE: Multidetector CT imaging of the chest was performed during intravenous contrast administration. CONTRAST:  48mL OMNIPAQUE IOHEXOL 300 MG/ML  SOLN COMPARISON:  07/09/2021 FINDINGS: Cardiovascular: Cardiomegaly. No pericardial effusion. Advanced atherosclerosis of the aorta with irregular plaque diffusely and the descending aneurysm which measures 4.9 Cm in diameter. No pulmonary artery filling defect. Mediastinum/Nodes: No adenopathy mass. Mild food retention of the esophagus. Lungs/Pleura: Moderate to large left pleural effusion which is simple density with extensive atelectasis. Significant clearing right-sided pneumonia since prior CT. Postoperative right upper lobe. Improved nodularity on prior which is also attributed to infection. Emphysema. Upper Abdomen: Extensive atherosclerosis. Hepatic venous reflux attributed to right heart failure Musculoskeletal: Exaggerated kyphosis.  No acute finding IMPRESSION: 1. Moderate to large simple left pleural effusion with multi segment atelectasis. 2. Essentially resolved pneumonia seen on prior CT. 3. Advanced atherosclerosis especially of the aorta with descending aneurysm measuring up to 4.9 cm in diameter. Follow-up depends on true morbidity/life expectancy. Aortic Atherosclerosis (ICD10-I70.0) and Emphysema (ICD10-J43.9). Electronically Signed   By: Jorje Guild M.D.   On: 08/07/2021 10:22   NM PET Image Initial (PI) Skull Base To Thigh  Result Date: 07/26/2021 CLINICAL DATA:  Initial treatment strategy for right lung mass. EXAM: NUCLEAR MEDICINE PET SKULL BASE TO THIGH TECHNIQUE: 6.1 mCi F-18 FDG was injected intravenously. Full-ring PET imaging was performed from the skull base to thigh after the radiotracer. CT data was obtained and used for attenuation correction and anatomic localization. Fasting blood glucose: 78 mg/dl COMPARISON:  CT chest 07/09/2021 FINDINGS: Mediastinal blood pool activity: SUV max  1.9 Liver activity: SUV max NA NECK: No significant abnormal hypermetabolic activity in this region. Incidental CT findings: Bilateral common carotid atherosclerotic calcifications. CHEST: Substantial clearing of prior airspace opacity with mild residual peripheral nodularity in the remaining right lung, particularly peripherally in the right lower lobe, which warrants surveillance but is currently not of high suspicion for active malignancy. This clustered peripheral nodularity has a maximum SUV up to 1.9. No central obstructing hypermetabolic lesion is identified. Right lower paratracheal lymph node 1.2 cm in short axis with maximum SUV 2.4, previous maximum  SUV 3.5. Incidental CT findings: Centrilobular emphysema. Coronary, aortic arch, and branch vessel atherosclerotic vascular disease. 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 Aneurysm of the descending thoracic aorta 5.1 cm in diameter. Moderate cardiomegaly. Dense mitral valve calcification. Trace pericardial effusion posteriorly. Small bilateral pleural effusions with associated passive atelectasis. Postoperative findings in the right lung. There is also some mild peripheral scattered nodularity in the left lung which is moderately improved compared to 07/09/2021. ABDOMEN/PELVIS: Complex cystic lesion in the left adnexa measuring 11.3 by 6.3 cm on image 191 series 4 with several loculations of varying density, but photopenic on the PET data. Smaller photopenic lesion of the right ovary. Incidental CT findings: Gallbladder wall thickening, nonspecific although inflammation is not readily excluded. Extensive atherosclerosis. Sigmoid colon diverticulosis. Diffuse subcutaneous edema. SKELETON: No significant abnormal hypermetabolic activity in this region. Incidental CT findings: Degenerative hip arthropathy, right greater than left. IMPRESSION: 1. Interval clearance of the vast majority of the airspace opacity in the right lung. There some small  peripheral areas of scattered nodularity which merit chest CT surveillance but which are not substantially hypermetabolic today, and accordingly may potentially be inflammatory/infectious. Postoperative findings in the right lung. 2. Stable mild paratracheal adenopathy, although SUV has decreased compared to the prior chest CT of 08/22/2018. 3. Complex cystic lesion of the left adnexa measuring up to 11.3 cm in long axis, photopenic. Smaller lesion of the right ovary is not substantially hypermetabolic either. 4. 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 Recommend semi-annual imaging followup by CTA or MRA and referral to cardiothoracic surgery if not already obtained. This recommendation follows 2010 ACCF/AHA/AATS/ACR/ASA/SCA/SCAI/SIR/STS/SVM Guidelines for the Diagnosis and Management of Patients With Thoracic Aortic Disease. Circulation. 2010; 121: K932-I71. Aortic aneurysm NOS (ICD10-I71.9) 5. Other imaging findings of potential clinical significance: Aortic Atherosclerosis (ICD10-I70.0) and Emphysema (ICD10-J43.9). Coronary atherosclerosis and extensive abdominal atherosclerosis. Moderate cardiomegaly. Dense mitral valve calcifications. Small bilateral pleural effusions along with subcutaneous edema suggesting mild third spacing of fluid. Nonspecific wall thickening in the gallbladder. Sigmoid colon diverticulosis. Degenerative hip arthropathy. Electronically Signed   By: Van Clines M.D.   On: 07/26/2021 07:13   DG Chest Port 1 View  Result Date: 08/08/2021 CLINICAL DATA:  Status post thoracentesis on the left side. EXAM: PORTABLE CHEST 1 VIEW COMPARISON:  08/07/2021 FINDINGS: Improved aeration in the left lower chest. Residual densities at the left lung base. Prominent interstitial lung markings bilaterally and minimally changed. Blunting of the right costophrenic angle is suggestive for right pleural fluid. Negative for pneumothorax. Heart size remains  enlarged. IMPRESSION: 1. Negative for a pneumothorax following the left thoracentesis. 2. Improved aeration in the left lower lung with residual densities at the left lung base. 3. Evidence for right pleural effusion. Electronically Signed   By: Markus Daft M.D.   On: 08/08/2021 10:38   DG Chest Portable 1 View  Result Date: 08/07/2021 CLINICAL DATA:  Increasing shortness of breath EXAM: PORTABLE CHEST 1 VIEW COMPARISON:  07/08/2021 FINDINGS: Improved right lung aeration. Worse left base aeration at least partially from pleural fluid. Small right pleural effusion. Generalized reticulonodular opacity. Chronic cardiomegaly. IMPRESSION: 1. Left more than right pleural effusion with obscured or opacified left lower lobe. 2. Significant clearance of right-sided pneumonia since chest x-ray last month. 3. Background chronic lung disease and nodularity reference recent PET. Electronically Signed   By: Jorje Guild M.D.   On: 08/07/2021 05:04   US THORACENTESIS ASP PLEURAL SPACE W/IMG GUIDE  Result Date: 08/08/2021 INDICATION: Patient with shortness of breath and large left pleural effusion request received for thoracentesis. EXAM: ULTRASOUND GUIDED DIAGNOSTIC AND THERAPEUTIC THORACENTESIS MEDICATIONS: Local 1% lidocaine only. COMPLICATIONS: None immediate. PROCEDURE: An ultrasound guided thoracentesis was thoroughly discussed with the patient and questions answered. The benefits, risks, alternatives and complications were also discussed. The patient understands and wishes to proceed with the procedure. Written consent was obtained. Ultrasound was performed to localize and mark an adequate pocket of fluid in the left chest. The area was then prepped and draped in the normal sterile fashion. 1% Lidocaine was used for local anesthesia. Under ultrasound guidance a 6 Fr Safe-T-Centesis catheter was introduced. Thoracentesis was performed. The catheter was removed and a dressing applied. FINDINGS: A total of  approximately 700 mL of clear yellow fluid was removed. Samples were sent to the laboratory as requested by the clinical team. IMPRESSION: Successful ultrasound guided left thoracentesis yielding 700 mL of pleural fluid. Read By: Tsosie Billing PA-C Electronically Signed   By: Ruthann Cancer M.D.   On: 08/08/2021 10:47   (Echo, Carotid, EGD, Colonoscopy, ERCP)    Subjective: Pt c/o fatigue   Discharge Exam: Vitals:   08/11/21 0714 08/11/21 1117  BP: (!) 149/103 134/86  Pulse: (!) 107 (!) 103  Resp: 18   Temp: 97.6 F (36.4 C) (!) 97.5 F (36.4 C)  SpO2: 96% 95%   Vitals:   08/11/21 0454 08/11/21 0500 08/11/21 0714 08/11/21 1117  BP: (!) 134/92  (!) 149/103 134/86  Pulse: (!) 51  (!) 107 (!) 103  Resp: 20 18 18    Temp: 98 F (36.7 C)  97.6 F (36.4 C) (!) 97.5 F (36.4 C)  TempSrc: Oral  Oral Oral  SpO2: 98%  96% 95%  Weight:      Height:        General: Pt is alert, awake, not in acute distress. Frail appearing  Cardiovascular: S1/S2 +, no rubs, no gallops Respiratory: course breath sounds b/l  Abdominal: Soft, NT, ND, bowel sounds + Extremities: no edema, no cyanosis    The results of significant diagnostics from this hospitalization (including imaging, microbiology, ancillary and laboratory) are listed below for reference.     Microbiology: Recent Results (from the past 240 hour(s))  Resp Panel by RT-PCR (Flu A&B, Covid) Nasopharyngeal Swab     Status: None   Collection Time: 08/07/21  4:13 AM   Specimen: Nasopharyngeal Swab; Nasopharyngeal(NP) swabs in vial transport medium  Result Value Ref Range Status   SARS Coronavirus 2 by RT PCR NEGATIVE NEGATIVE Final    Comment: (NOTE) SARS-CoV-2 target nucleic acids are NOT DETECTED.  The SARS-CoV-2 RNA is generally detectable in upper respiratory specimens during the acute phase of infection. The lowest concentration of SARS-CoV-2 viral copies this assay can detect is 138 copies/mL. A negative result does not  preclude SARS-Cov-2 infection and should not be used as the sole basis for treatment or other patient management decisions. A negative result may occur with  improper specimen collection/handling, submission of specimen other than nasopharyngeal swab, presence of viral mutation(s) within the areas targeted by this assay, and inadequate number of viral copies(<138 copies/mL). A negative result must be combined with clinical observations, patient history, and epidemiological information. The expected result is Negative.  Fact Sheet for Patients:  EntrepreneurPulse.com.au  Fact Sheet for Healthcare Providers:  IncredibleEmployment.be  This test is no t yet approved or cleared by the [place] FDA and  has been authorized for detection and/or  diagnosis of SARS-CoV-2 by FDA under an Emergency Use Authorization (EUA). This EUA will remain  in effect (meaning this test can be used) for the duration of the COVID-19 declaration under Section 564(b)(1) of the Act, 21 U.S.C.section 360bbb-3(b)(1), unless the authorization is terminated  or revoked sooner.       Influenza A by PCR NEGATIVE NEGATIVE Final   Influenza B by PCR NEGATIVE NEGATIVE Final    Comment: (NOTE) The Xpert Xpress SARS-CoV-2/FLU/RSV plus assay is intended as an aid in the diagnosis of influenza from Nasopharyngeal swab specimens and should not be used as a sole basis for treatment. Nasal washings and aspirates are unacceptable for Xpert Xpress SARS-CoV-2/FLU/RSV testing.  Fact Sheet for Patients: EntrepreneurPulse.com.au  Fact Sheet for Healthcare Providers: IncredibleEmployment.be  This test is not yet approved or cleared by the [place] FDA and has been authorized for detection and/or diagnosis of SARS-CoV-2 by FDA under an Emergency Use Authorization (EUA). This EUA will remain in effect (meaning this test can be used) for the  duration of the COVID-19 declaration under Section 564(b)(1) of the Act, 21 U.S.C. section 360bbb-3(b)(1), unless the authorization is terminated or revoked.  Performed at [facility], [address] [place], [place] [address]   Culture, blood (Routine X 2) w Reflex to ID Panel     Status: None (Preliminary result)   Collection Time: 08/07/21  4:36 AM   Specimen: BLOOD RIGHT HAND  Result Value Ref Range Status   Specimen Description BLOOD RIGHT HAND  Final   Special Requests   Final    BOTTLES DRAWN AEROBIC AND ANAEROBIC Blood Culture results may not be optimal due to an inadequate volume of blood received in culture bottles   Culture   Final    NO GROWTH 4 DAYS Performed at [facility], [address], [place], [place] [address]    Report Status PENDING  Incomplete  Culture, blood (Routine X 2) w Reflex to ID Panel     Status: None (Preliminary result)   Collection Time: 08/07/21  5:07 AM   Specimen: BLOOD RIGHT FOREARM  Result Value Ref Range Status   Specimen Description BLOOD RIGHT FOREARM  Final   Special Requests   Final    BOTTLES DRAWN AEROBIC AND ANAEROBIC Blood Culture adequate volume   Culture   Final    NO GROWTH 4 DAYS Performed at [facility], [address] [place], [place] [address]    Report Status PENDING  Incomplete  Expectorated Sputum Assessment w Gram Stain, Rflx to Resp Cult     Status: None   Collection Time: 08/07/21  8:32 PM   Specimen: Sputum  Result Value Ref Range Status   Specimen Description SPUTUM  Final   Special Requests NONE  Final   Sputum evaluation   Final    THIS SPECIMEN IS ACCEPTABLE FOR SPUTUM CULTURE Performed at [facility], [address] [place], [place] [address]    Report Status 08/07/2021 FINAL  Final  Culture, Respiratory w Gram Stain     Status: None   Collection Time: 08/07/21  8:32 PM   Specimen: SPU  Result Value Ref Range Status   Specimen Description   Final     SPUTUM Performed at [facility], [address] [place], [place] [address]    Special Requests   Final    NONE Reflexed from [phone] Performed at [facility], [address], [place],  [address]    Gram  Stain   Final    MODERATE WBC PRESENT,BOTH PMN AND MONONUCLEAR RARE YEAST FEW GRAM POSITIVE COCCI IN PAIRS FEW GRAM VARIABLE ROD    Culture   Final    FEW ACINETOBACTER BAUMANNII MODERATE DIPHTHEROIDS(CORYNEBACTERIUM SPECIES) Standardized susceptibility testing for this organism is not available. Performed at [facility], [place] [address] [place], [place] [address]    Report Status 08/10/2021 FINAL  Final   Organism ID, Bacteria ACINETOBACTER BAUMANNII  Final      Susceptibility   Acinetobacter baumannii - MIC*    CEFTAZIDIME 4 SENSITIVE Sensitive     CIPROFLOXACIN >=4 RESISTANT Resistant     GENTAMICIN 2 SENSITIVE Sensitive     IMIPENEM <=0.25 SENSITIVE Sensitive     PIP/TAZO <=4 SENSITIVE Sensitive     TRIMETH/SULFA <=20 SENSITIVE Sensitive     AMPICILLIN/SULBACTAM <=2 SENSITIVE Sensitive     * FEW ACINETOBACTER BAUMANNII  Body fluid culture w Gram Stain     Status: None (Preliminary result)   Collection Time: 08/08/21  9:45 AM   Specimen: PATH Cytology Pleural fluid  Result Value Ref Range Status   Specimen Description   Final    PLEURAL Performed at [facility], [address] [place], [place] [address]    Special Requests   Final    PLEURAL Performed at [facility], [address] [place], [place] [address]    Gram Stain   Final    WBC PRESENT, PREDOMINANTLY MONONUCLEAR NO ORGANISMS SEEN CYTOSPIN SMEAR    Culture   Final    NO GROWTH 3 DAYS Performed at [facility], [place] [address], [place],  [address]    Report Status PENDING  Incomplete     Labs: BNP (last 3 results) Recent Labs    07/08/21 2206 08/07/21 0435  BNP 288.7* 762.2*   Basic Metabolic Panel: Recent Labs  Lab  08/07/21 0435 08/08/21 0651 08/11/21 0405  NA 130* 129* 126*  K 4.1 3.8 4.1  CL 96* 91* 89*  CO2 26 28 30   GLUCOSE 110* 130* 83  BUN 17 18 20   CREATININE 0.67 0.75 0.61  CALCIUM 8.8* 8.5* 8.4*   Liver Function Tests: No results for input(s): AST, ALT, ALKPHOS, BILITOT, PROT, ALBUMIN in the last 168 hours. No results for input(s): LIPASE, AMYLASE in the last 168 hours. No results for input(s): AMMONIA in the last 168 hours. CBC: Recent Labs  Lab 08/07/21 0435 08/08/21 0651 08/11/21 0405  WBC 7.4 5.4 10.2  NEUTROABS 5.1  --   --   HGB 11.5* 12.3 12.8  HCT 33.7* 34.6* 37.9  MCV 96.0 93.5 93.6  PLT 255 261 268   Cardiac Enzymes: No results for input(s): CKTOTAL, CKMB, CKMBINDEX, TROPONINI in the last 168 hours. BNP: Invalid input(s): POCBNP CBG: Recent Labs  Lab 08/10/21 2133  GLUCAP 107*   D-Dimer No results for input(s): DDIMER in the last 72 hours. Hgb A1c No results for input(s): HGBA1C in the last 72 hours. Lipid Profile No results for input(s): CHOL, HDL, LDLCALC, TRIG, CHOLHDL, LDLDIRECT in the last 72 hours. Thyroid function studies No results for input(s): TSH, T4TOTAL, T3FREE, THYROIDAB in the last 72 hours.  Invalid input(s): FREET3 Anemia work up No results for input(s): VITAMINB12, FOLATE, FERRITIN, TIBC, IRON, RETICCTPCT in the last 72 hours. Urinalysis    Component Value Date/Time   COLORURINE YELLOW 07/09/2021 [place] 07/09/2021 0857   APPEARANCEUR Clear 05/28/2014 0855   LABSPEC 1.010 07/09/2021 0857  LABSPEC 1.013 05/28/2014 0855   PHURINE 5.5 07/09/2021 0857   GLUCOSEU NEGATIVE 07/09/2021 0857   GLUCOSEU Negative 05/28/2014 0855   HGBUR NEGATIVE 07/09/2021 0857   BILIRUBINUR NEGATIVE 07/09/2021 0857   BILIRUBINUR Negative 05/28/2014 0855   KETONESUR NEGATIVE 07/09/2021 0857   PROTEINUR NEGATIVE 07/09/2021 0857   NITRITE NEGATIVE 07/09/2021 0857   LEUKOCYTESUR NEGATIVE 07/09/2021 0857   LEUKOCYTESUR Negative  05/28/2014 0855   Sepsis Labs Invalid input(s): PROCALCITONIN,  WBC,  LACTICIDVEN Microbiology Recent Results (from the past 240 hour(s))  Resp Panel by RT-PCR (Flu A&B, Covid) Nasopharyngeal Swab     Status: None   Collection Time: 08/07/21  4:13 AM   Specimen: Nasopharyngeal Swab; Nasopharyngeal(NP) swabs in vial transport medium  Result Value Ref Range Status   SARS Coronavirus 2 by RT PCR NEGATIVE NEGATIVE Final    Comment: (NOTE) SARS-CoV-2 target nucleic acids are NOT DETECTED.  The SARS-CoV-2 RNA is generally detectable in upper respiratory specimens during the acute phase of infection. The lowest concentration of SARS-CoV-2 viral copies this assay can detect is 138 copies/mL. A negative result does not preclude SARS-Cov-2 infection and should not be used as the sole basis for treatment or other patient management decisions. A negative result may occur with  improper specimen collection/handling, submission of specimen other than nasopharyngeal swab, presence of viral mutation(s) within the areas targeted by this assay, and inadequate number of viral copies(<138 copies/mL). A negative result must be combined with clinical observations, patient history, and epidemiological information. The expected result is Negative.  Fact Sheet for Patients:  EntrepreneurPulse.com.au  Fact Sheet for Healthcare Providers:  IncredibleEmployment.be  This test is no t yet approved or cleared by the [place] FDA and  has been authorized for detection and/or diagnosis of SARS-CoV-2 by FDA under an Emergency Use Authorization (EUA). This EUA will remain  in effect (meaning this test can be used) for the duration of the COVID-19 declaration under Section 564(b)(1) of the Act, 21 U.S.C.section 360bbb-3(b)(1), unless the authorization is terminated  or revoked sooner.       Influenza A by PCR NEGATIVE NEGATIVE Final   Influenza B by PCR NEGATIVE  NEGATIVE Final    Comment: (NOTE) The Xpert Xpress SARS-CoV-2/FLU/RSV plus assay is intended as an aid in the diagnosis of influenza from Nasopharyngeal swab specimens and should not be used as a sole basis for treatment. Nasal washings and aspirates are unacceptable for Xpert Xpress SARS-CoV-2/FLU/RSV testing.  Fact Sheet for Patients: EntrepreneurPulse.com.au  Fact Sheet for Healthcare Providers: IncredibleEmployment.be  This test is not yet approved or cleared by the [place] FDA and has been authorized for detection and/or diagnosis of SARS-CoV-2 by FDA under an Emergency Use Authorization (EUA). This EUA will remain in effect (meaning this test can be used) for the duration of the COVID-19 declaration under Section 564(b)(1) of the Act, 21 U.S.C. section 360bbb-3(b)(1), unless the authorization is terminated or revoked.  Performed at [facility], [address] [place], [place] [address]   Culture, blood (Routine X 2) w Reflex to ID Panel     Status: None (Preliminary result)   Collection Time: 08/07/21  4:36 AM   Specimen: BLOOD RIGHT HAND  Result Value Ref Range Status   Specimen Description BLOOD RIGHT HAND  Final   Special Requests   Final    BOTTLES DRAWN AEROBIC AND ANAEROBIC Blood Culture results may not be optimal due to an inadequate volume of blood received in culture bottles   Culture  Final    NO GROWTH 4 DAYS Performed at [facility], [address] [place], [place] [address]    Report Status PENDING  Incomplete  Culture, blood (Routine X 2) w Reflex to ID Panel     Status: None (Preliminary result)   Collection Time: 08/07/21  5:07 AM   Specimen: BLOOD RIGHT FOREARM  Result Value Ref Range Status   Specimen Description BLOOD RIGHT FOREARM  Final   Special Requests   Final    BOTTLES DRAWN AEROBIC AND ANAEROBIC Blood Culture adequate volume   Culture   Final    NO GROWTH 4 DAYS Performed  at [facility], [address] [place], [place] [address]    Report Status PENDING  Incomplete  Expectorated Sputum Assessment w Gram Stain, Rflx to Resp Cult     Status: None   Collection Time: 08/07/21  8:32 PM   Specimen: Sputum  Result Value Ref Range Status   Specimen Description SPUTUM  Final   Special Requests NONE  Final   Sputum evaluation   Final    THIS SPECIMEN IS ACCEPTABLE FOR SPUTUM CULTURE Performed at [facility], [address] [place], [place] [address]    Report Status 08/07/2021 FINAL  Final  Culture, Respiratory w Gram Stain     Status: None   Collection Time: 08/07/21  8:32 PM   Specimen: SPU  Result Value Ref Range Status   Specimen Description   Final    SPUTUM Performed at [facility], [address], [place], [place] [address]    Special Requests   Final    NONE Reflexed from [phone] Performed at [facility], [address] [place], [place] [address]    Gram Stain   Final    MODERATE WBC PRESENT,BOTH PMN AND MONONUCLEAR RARE YEAST FEW GRAM POSITIVE COCCI IN PAIRS FEW GRAM VARIABLE ROD    Culture   Final    FEW ACINETOBACTER BAUMANNII MODERATE DIPHTHEROIDS(CORYNEBACTERIUM SPECIES) Standardized susceptibility testing for this organism is not available. Performed at [facility], [place] [address] [place], [place] [address]    Report Status 08/10/2021 FINAL  Final   Organism ID, Bacteria ACINETOBACTER BAUMANNII  Final      Susceptibility   Acinetobacter baumannii - MIC*    CEFTAZIDIME 4 SENSITIVE Sensitive     CIPROFLOXACIN >=4 RESISTANT Resistant     GENTAMICIN 2 SENSITIVE Sensitive     IMIPENEM <=0.25 SENSITIVE Sensitive     PIP/TAZO <=4 SENSITIVE Sensitive     TRIMETH/SULFA <=20 SENSITIVE Sensitive     AMPICILLIN/SULBACTAM <=2 SENSITIVE Sensitive     * FEW ACINETOBACTER BAUMANNII  Body fluid culture w Gram Stain     Status: None (Preliminary result)   Collection Time: 08/08/21  9:45 AM    Specimen: PATH Cytology Pleural fluid  Result Value Ref Range Status   Specimen Description   Final    PLEURAL Performed at [facility], [address] [place], [place] [address]    Special Requests   Final    PLEURAL Performed at [facility], [address] [place], [place] [address]    Gram Stain   Final    WBC PRESENT, PREDOMINANTLY MONONUCLEAR NO ORGANISMS SEEN CYTOSPIN SMEAR    Culture   Final    NO GROWTH 3 DAYS Performed at [facility], [place] [address], [place], [place] [address]    Report Status PENDING  Incomplete     Time  coordinating discharge: Over 30 minutes  SIGNED:   Wyvonnia Dusky, MD  [facility] 08/11/2021, 2:29 PM Pager   If 7PM-7AM, please contact night-coverage

## 2021-08-11 NOTE — Progress Notes (Addendum)
Mobility Specialist - Progress Note   08/11/21 1600  Mobility  Activity Dangled on edge of bed  Level of Assistance Standby assist, set-up cues, supervision of patient - no hands on  Assistive Device None  Distance Ambulated (ft) 0 ft  Mobility Sit up in bed/chair position for meals  Mobility Response Tolerated well  Mobility performed by Mobility specialist  $Mobility charge 1 Mobility    Pt lying in bed, utilizing 4L. Initially was agreeable to OOB activity, but then declined. Agreeable to participate in therex with supervision. Pt left EOB with alarm set.    Kathee Delton Mobility Specialist 08/11/21, 4:31 PM

## 2021-08-11 NOTE — TOC Transition Note (Signed)
Transition of Care [facility]) - CM/SW Discharge Note   Patient Details  Name: Felicia Sellers MRN: [id number] Date of Birth: 08-16-1941  [facility] CM/SW Contact:  Eileen Stanford, LCSW Phone Number: 08/11/2021, 2:46 PM   Clinical Narrative:   EMS transport set for 5:00.     Final next level of care: [place] Barriers to Discharge: No Barriers Identified   Patient Goals and CMS Choice        Discharge Placement                Patient to be transferred to facility by: [facility]   Patient and family notified of of transfer: 08/11/21  Discharge Plan and Services In-house Referral: Clinical Social Work   Post Acute Care Choice: [place]                               Social Determinants of Health (SDOH) Interventions     Readmission Risk Interventions No flowsheet data found.

## 2021-08-11 NOTE — Progress Notes (Signed)
Pulmonary Medicine          Date: 08/11/2021,   MRN# [id number] Felicia Sellers Nov 09, 1940     AdmissionWeight: 53.5 kg                 CurrentWeight: 51.6 kg  Refferring physician: Dr Sarajane Jews    CHIEF COMPLAINT:   Acute on chronic hypoxemic respiratory failure   HISTORY OF PRESENT ILLNESS   This is a 80 yo F with hx of Aortic aneurysm, Right lung cancer s/p resection , CHF, chronic hyponatremia, COPD with chronic hypoxemia on 3L/min [place] supplemental O2 at home. She has hx of gastric ulceration, essential HTN, and ovarian mass, who came in after acute on sent SOB and was admitted with COPD exacerbation. She was noted to be hypoxemic in the 70s.  She had chest imaging done and was noted to have large left sided pleural effusion. This was drained and fluid was cloudy straw colored appx 700cc.  I was present during procedure and there were no complications noted. Fluid profile thus far consistent with lymphocyte predominant transudate. PCCM consultation for further evaluation and management.   08/09/21- patient shares she is feeling better and is now on 3L/min [place]. She is speaking in full sentences and is normoxic but does report dyspnea. Plan to continue current regimen. Dcd Cefepime and started augmetin 500/125 tid with solumedrol reduced to 40mg  daily.    08/10/21-  patient is stable smiling during interview. She is getting close to baseline.   08/11/21- patient cleared for dc home, reviewed with Dr Jimmye Norman today.   PAST MEDICAL HISTORY   Past Medical History:  Diagnosis Date   Aortic aneurysm ([place]) 08/08/2021   Aortic atherosclerosis ([place]) 08/08/2021   Cancer ([place])    right lung removed - lung cancer 1/3 lung removed    CHF (congestive heart failure) (H[facility])    Chronic diastolic CHF (congestive heart failure) ([place]) 08/08/2021   Chronic hyponatremia 11/04/2015   Overview:  Normal serum oslmolality   COPD (chronic obstructive pulmonary disease) (HCC)    Coronary artery  disease    Emphysema ([place]) 08/08/2021   History of stomach ulcers    Hypertension    Lung mass 08/08/2021   Ovarian mass 08/08/2021   Pneumonia    Unspecified atrial fibrillation ([place]) 08/08/2021     SURGICAL HISTORY   Past Surgical History:  Procedure Laterality Date   LUNG REMOVAL, PARTIAL Left      FAMILY HISTORY   Family History  Problem Relation Age of Onset   Breast cancer Neg Hx      SOCIAL HISTORY   Social History   Tobacco Use   Smoking status: Former    Packs/day: 1.00    Years: 66.00    Pack years: 66.00    Types: Cigarettes    Quit date: 06/26/2020    Years since quitting: 1.1   Smokeless tobacco: Never  Vaping Use   Vaping Use: Never used  Substance Use Topics   Alcohol use: Not Currently    Comment: use to drink alot ( vodka) 6 months ago   Drug use: Never     MEDICATIONS    Home Medication:  REM   Current Medication:  Current Facility-Administered Medications:    acetaminophen (TYLENOL) tablet 650 mg, 650 mg, Oral, Q6H PRN **OR** acetaminophen (TYLENOL) suppository 650 mg, 650 mg, Rectal, Q6H PRN, Karmen Bongo, MD   albuterol (PROVENTIL) (2.5 MG/3ML) 0.083% nebulizer solution 2.5 mg, 2.5 mg, Nebulization, Q2H PRN, Lorin Mercy,  Anderson Malta, MD, 2.5 mg at 08/10/21 1423   amoxicillin-clavulanate (AUGMENTIN) 500-125 MG per tablet 500 mg, 1 tablet, Oral, Q8H, Donovon Micheletti, MD, 500 mg at 08/11/21 0527   atorvastatin (LIPITOR) tablet 40 mg, 40 mg, Oral, QHS, Karmen Bongo, MD, 40 mg at 08/10/21 2136   bisacodyl (DULCOLAX) EC tablet 5 mg, 5 mg, Oral, Daily PRN, Karmen Bongo, MD   docusate sodium (COLACE) capsule 100 mg, 100 mg, Oral, BID, Karmen Bongo, MD, 100 mg at 08/11/21 0905   enoxaparin (LOVENOX) injection 40 mg, 40 mg, Subcutaneous, Q24H, Karmen Bongo, MD, 40 mg at 08/10/21 2136   feeding supplement (ENSURE ENLIVE / ENSURE PLUS) liquid 237 mL, 237 mL, Oral, TID BM, Samuella Cota, MD, 237 mL at 92/42/68 3419   folic acid  (FOLVITE) tablet 1 mg, 1 mg, Oral, Daily, Samuella Cota, MD, 1 mg at 08/11/21 6222   gabapentin (NEURONTIN) capsule 300 mg, 300 mg, Oral, TID, Karmen Bongo, MD, 300 mg at 08/11/21 9798   guaiFENesin (MUCINEX) 12 hr tablet 600 mg, 600 mg, Oral, BID PRN, Karmen Bongo, MD, 600 mg at 08/10/21 1423   hydrALAZINE (APRESOLINE) injection 5 mg, 5 mg, Intravenous, Q4H PRN, Karmen Bongo, MD   HYDROcodone-acetaminophen (NORCO/VICODIN) 5-325 MG per tablet 1-2 tablet, 1-2 tablet, Oral, Q4H PRN, Karmen Bongo, MD   ipratropium-albuterol (DUONEB) 0.5-2.5 (3) MG/3ML nebulizer solution 3 mL, 3 mL, Nebulization, Q6H, Karmen Bongo, MD, 3 mL at 08/11/21 0739   LORazepam (ATIVAN) tablet 1-4 mg, 1-4 mg, Oral, Q1H PRN **OR** LORazepam (ATIVAN) injection 1-4 mg, 1-4 mg, Intravenous, Q1H PRN, Samuella Cota, MD   methylPREDNISolone sodium succinate (SOLU-MEDROL) 40 mg/mL injection 40 mg, 40 mg, Intravenous, Daily, Ottie Glazier, MD, 40 mg at 08/11/21 9211   metoprolol succinate (TOPROL-XL) 24 hr tablet 25 mg, 25 mg, Oral, Daily, Karmen Bongo, MD, 25 mg at 08/11/21 9417   multivitamin with minerals tablet 1 tablet, 1 tablet, Oral, Daily, Samuella Cota, MD, 1 tablet at 08/11/21 4081   nicotine (NICODERM CQ - dosed in mg/24 hours) patch 14 mg, 14 mg, Transdermal, Daily, Karmen Bongo, MD, 14 mg at 08/11/21 0908   ondansetron (ZOFRAN) tablet 4 mg, 4 mg, Oral, Q6H PRN **OR** ondansetron (ZOFRAN) injection 4 mg, 4 mg, Intravenous, Q6H PRN, Karmen Bongo, MD   polyethylene glycol (MIRALAX / GLYCOLAX) packet 17 g, 17 g, Oral, Daily PRN, Karmen Bongo, MD   sodium chloride flush (NS) 0.9 % injection 3 mL, 3 mL, Intravenous, Q12H, Karmen Bongo, MD, 3 mL at 08/11/21 0909   sodium chloride tablet 1 g, 1 g, Oral, BID WC, Wyvonnia Dusky, MD   sulfamethoxazole-trimethoprim (BACTRIM DS) 800-160 MG per tablet 1 tablet, 1 tablet, Oral, Q12H, Wyvonnia Dusky, MD, 1 tablet at 08/11/21 4481    thiamine tablet 100 mg, 100 mg, Oral, Daily, 100 mg at 08/11/21 0904 **OR** thiamine (B-1) injection 100 mg, 100 mg, Intravenous, Daily, Samuella Cota, MD   zolpidem Lorrin Mais) tablet 5 mg, 5 mg, Oral, QHS PRN, Karmen Bongo, MD, 5 mg at 08/10/21 2135    ALLERGIES   Aspirin     REVIEW OF SYSTEMS    Review of Systems:  Gen:  Denies  fever, sweats, chills weigh loss  HEENT: Denies blurred vision, double vision, ear pain, eye pain, hearing loss, nose bleeds, sore throat Cardiac:  No dizziness, chest pain or heaviness, chest tightness,edema Resp:   Denies cough or sputum porduction, shortness of breath,wheezing, hemoptysis,  Gi: Denies swallowing difficulty, stomach pain, nausea  or vomiting, diarrhea, constipation, bowel incontinence Gu:  Denies bladder incontinence, burning urine Ext:   Denies Joint pain, stiffness or swelling Skin: Denies  skin rash, easy bruising or bleeding or hives Endoc:  Denies polyuria, polydipsia , polyphagia or weight change Psych:   Denies depression, insomnia or hallucinations   Other:  All other systems negative   VS: BP (!) 149/103 (BP Location: Left Arm)   Pulse (!) 107   Temp 97.6 F (36.4 C) (Oral)   Resp 18   Ht 5\' 3"  (1.6 m)   Wt 51.6 kg   SpO2 96%   BMI 20.15 kg/m      PHYSICAL EXAM    GENERAL:NAD, no fevers, chills, no weakness no fatigue HEAD: Normocephalic, atraumatic.  EYES: Pupils equal, round, reactive to light. Extraocular muscles intact. No scleral icterus.  MOUTH: Moist mucosal membrane. Dentition intact. No abscess noted.  EAR, NOSE, THROAT: Clear without exudates. No external lesions.  NECK: Supple. No thyromegaly. No nodules. No JVD.  PULMONARY: Decreased air entry b/l worse on left lung  CARDIOVASCULAR: S1 and S2. Regular rate and rhythm. No murmurs, rubs, or gallops. No edema. Pedal pulses 2+ bilaterally.  GASTROINTESTINAL: Soft, nontender, nondistended. No masses. Positive bowel sounds. No hepatosplenomegaly.   MUSCULOSKELETAL: No swelling, clubbing, or edema. Range of motion full in all extremities.  NEUROLOGIC: Cranial nerves II through XII are intact. No gross focal neurological deficits. Sensation intact. Reflexes intact.  SKIN: No ulceration, lesions, rashes, or cyanosis. Skin warm and dry. Turgor intact.  PSYCHIATRIC: Mood, affect within normal limits. The patient is awake, alert and oriented x 3. Insight, judgment intact.       IMAGING      CT CHEST W CONTRAST  Result Date: 08/07/2021 CLINICAL DATA:  COPD exacerbation EXAM: CT CHEST WITH CONTRAST TECHNIQUE: Multidetector CT imaging of the chest was performed during intravenous contrast administration. CONTRAST:  97mL OMNIPAQUE IOHEXOL 300 MG/ML  SOLN COMPARISON:  07/09/2021 FINDINGS: Cardiovascular: Cardiomegaly. No pericardial effusion. Advanced atherosclerosis of the aorta with irregular plaque diffusely and the descending aneurysm which measures 4.9 Cm in diameter. No pulmonary artery filling defect. Mediastinum/Nodes: No adenopathy mass. Mild food retention of the esophagus. Lungs/Pleura: Moderate to large left pleural effusion which is simple density with extensive atelectasis. Significant clearing right-sided pneumonia since prior CT. Postoperative right upper lobe. Improved nodularity on prior which is also attributed to infection. Emphysema. Upper Abdomen: Extensive atherosclerosis. Hepatic venous reflux attributed to right heart failure Musculoskeletal: Exaggerated kyphosis.  No acute finding IMPRESSION: 1. Moderate to large simple left pleural effusion with multi segment atelectasis. 2. Essentially resolved pneumonia seen on prior CT. 3. Advanced atherosclerosis especially of the aorta with descending aneurysm measuring up to 4.9 cm in diameter. Follow-up depends on true morbidity/life expectancy. Aortic Atherosclerosis (ICD10-I70.0) and Emphysema (ICD10-J43.9). Electronically Signed   By: Jorje Guild M.D.   On: 08/07/2021 10:22    NM PET Image Initial (PI) Skull Base To Thigh  Result Date: 07/26/2021 CLINICAL DATA:  Initial treatment strategy for right lung mass. EXAM: NUCLEAR MEDICINE PET SKULL BASE TO THIGH TECHNIQUE: 6.1 mCi F-18 FDG was injected intravenously. Full-ring PET imaging was performed from the skull base to thigh after the radiotracer. CT data was obtained and used for attenuation correction and anatomic localization. Fasting blood glucose: 78 mg/dl COMPARISON:  CT chest 07/09/2021 FINDINGS: Mediastinal blood pool activity: SUV max 1.9 Liver activity: SUV max NA NECK: No significant abnormal hypermetabolic activity in this region. Incidental CT findings: Bilateral common carotid atherosclerotic  calcifications. CHEST: Substantial clearing of prior airspace opacity with mild residual peripheral nodularity in the remaining right lung, particularly peripherally in the right lower lobe, which warrants surveillance but is currently not of high suspicion for active malignancy. This clustered peripheral nodularity has a maximum SUV up to 1.9. No central obstructing hypermetabolic lesion is identified. Right lower paratracheal lymph node 1.2 cm in short axis with maximum SUV 2.4, previous maximum SUV 3.5. Incidental CT findings: Centrilobular emphysema. Coronary, aortic arch, and branch vessel atherosclerotic vascular disease. 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 Aneurysm of the descending thoracic aorta 5.1 cm in diameter. Moderate cardiomegaly. Dense mitral valve calcification. Trace pericardial effusion posteriorly. Small bilateral pleural effusions with associated passive atelectasis. Postoperative findings in the right lung. There is also some mild peripheral scattered nodularity in the left lung which is moderately improved compared to 07/09/2021. ABDOMEN/PELVIS: Complex cystic lesion in the left adnexa measuring 11.3 by 6.3 cm on image 191 series 4 with several loculations of varying density, but photopenic on  the PET data. Smaller photopenic lesion of the right ovary. Incidental CT findings: Gallbladder wall thickening, nonspecific although inflammation is not readily excluded. Extensive atherosclerosis. Sigmoid colon diverticulosis. Diffuse subcutaneous edema. SKELETON: No significant abnormal hypermetabolic activity in this region. Incidental CT findings: Degenerative hip arthropathy, right greater than left. IMPRESSION: 1. Interval clearance of the vast majority of the airspace opacity in the right lung. There some small peripheral areas of scattered nodularity which merit chest CT surveillance but which are not substantially hypermetabolic today, and accordingly may potentially be inflammatory/infectious. Postoperative findings in the right lung. 2. Stable mild paratracheal adenopathy, although SUV has decreased compared to the prior chest CT of 08/22/2018. 3. Complex cystic lesion of the left adnexa measuring up to 11.3 cm in long axis, photopenic. Smaller lesion of the right ovary is not substantially hypermetabolic either. 4. 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 Recommend semi-annual imaging followup by CTA or MRA and referral to cardiothoracic surgery if not already obtained. This recommendation follows 2010 ACCF/AHA/AATS/ACR/ASA/SCA/SCAI/SIR/STS/SVM Guidelines for the Diagnosis and Management of Patients With Thoracic Aortic Disease. Circulation. 2010; 121: F027-X41. Aortic aneurysm NOS (ICD10-I71.9) 5. Other imaging findings of potential clinical significance: Aortic Atherosclerosis (ICD10-I70.0) and Emphysema (ICD10-J43.9). Coronary atherosclerosis and extensive abdominal atherosclerosis. Moderate cardiomegaly. Dense mitral valve calcifications. Small bilateral pleural effusions along with subcutaneous edema suggesting mild third spacing of fluid. Nonspecific wall thickening in the gallbladder. Sigmoid colon diverticulosis. Degenerative hip arthropathy.  Electronically Signed   By: Van Clines M.D.   On: 07/26/2021 07:13   DG Chest Port 1 View  Result Date: 08/08/2021 CLINICAL DATA:  Status post thoracentesis on the left side. EXAM: PORTABLE CHEST 1 VIEW COMPARISON:  08/07/2021 FINDINGS: Improved aeration in the left lower chest. Residual densities at the left lung base. Prominent interstitial lung markings bilaterally and minimally changed. Blunting of the right costophrenic angle is suggestive for right pleural fluid. Negative for pneumothorax. Heart size remains enlarged. IMPRESSION: 1. Negative for a pneumothorax following the left thoracentesis. 2. Improved aeration in the left lower lung with residual densities at the left lung base. 3. Evidence for right pleural effusion. Electronically Signed   By: Markus Daft M.D.   On: 08/08/2021 10:38   DG Chest Portable 1 View  Result Date: 08/07/2021 CLINICAL DATA:  Increasing shortness of breath EXAM: PORTABLE CHEST 1 VIEW COMPARISON:  07/08/2021 FINDINGS: Improved right lung aeration. Worse left base aeration at least partially  from pleural fluid. Small right pleural effusion. Generalized reticulonodular opacity. Chronic cardiomegaly. IMPRESSION: 1. Left more than right pleural effusion with obscured or opacified left lower lobe. 2. Significant clearance of right-sided pneumonia since chest x-ray last month. 3. Background chronic lung disease and nodularity reference recent PET. Electronically Signed   By: Jorje Guild M.D.   On: 08/07/2021 05:04   US THORACENTESIS ASP PLEURAL SPACE W/IMG GUIDE  Result Date: 08/08/2021 INDICATION: Patient with shortness of breath and large left pleural effusion request received for thoracentesis. EXAM: ULTRASOUND GUIDED DIAGNOSTIC AND THERAPEUTIC THORACENTESIS MEDICATIONS: Local 1% lidocaine only. COMPLICATIONS: None immediate. PROCEDURE: An ultrasound guided thoracentesis was thoroughly discussed with the patient and questions answered. The benefits, risks,  alternatives and complications were also discussed. The patient understands and wishes to proceed with the procedure. Written consent was obtained. Ultrasound was performed to localize and mark an adequate pocket of fluid in the left chest. The area was then prepped and draped in the normal sterile fashion. 1% Lidocaine was used for local anesthesia. Under ultrasound guidance a 6 Fr Safe-T-Centesis catheter was introduced. Thoracentesis was performed. The catheter was removed and a dressing applied. FINDINGS: A total of approximately 700 mL of clear yellow fluid was removed. Samples were sent to the laboratory as requested by the clinical team. IMPRESSION: Successful ultrasound guided left thoracentesis yielding 700 mL of pleural fluid. Read By: Tsosie Billing PA-C Electronically Signed   By: Ruthann Cancer M.D.   On: 08/08/2021 10:47      ASSESSMENT/PLAN   Acute on chronic hypoxemic respiratory failure   - currently seems to have complete atelectasis of LLL due to large left pleural effusion    - recommend incentive spirometry and flutter valve -agree with empiric tx for CAP and COPD - agree with cefepime -agree with solumedrol - currently at weaning protocol -poor prognosis in DNR patient would continue palliative evaluation  Large left pleural effusion   - s/p aspiration of 700cc cloudly straw colored lymphocyte predominant transudate   - this is likely due to CKD/CHF and is likely the etiology    - due to hx of cancer would evaluate cytology      Thank you for allowing me to participate in the care of this patient.     Patient/Family are satisfied with care plan and all questions have been answered.  This document was prepared using Dragon voice recognition software and may include unintentional dictation errors.     Ottie Glazier, M.D.  Division of [place]

## 2021-08-12 ENCOUNTER — Other Ambulatory Visit: Payer: Self-pay

## 2021-08-12 ENCOUNTER — Inpatient Hospital Stay
Admission: EM | Admit: 2021-08-12 | Discharge: 2021-08-20 | DRG: 291 | Disposition: A | Discharge status: Hospice | Payer: Medicare Other | Attending: Student | Admitting: Student

## 2021-08-12 ENCOUNTER — Emergency Department: Payer: Medicare Other

## 2021-08-12 DIAGNOSIS — I10 Essential (primary) hypertension: Secondary | ICD-10-CM | POA: Diagnosis present

## 2021-08-12 DIAGNOSIS — X58XXXA Exposure to other specified factors, initial encounter: Secondary | ICD-10-CM | POA: Diagnosis present

## 2021-08-12 DIAGNOSIS — H905 Unspecified sensorineural hearing loss: Secondary | ICD-10-CM | POA: Diagnosis present

## 2021-08-12 DIAGNOSIS — Z7901 Long term (current) use of anticoagulants: Secondary | ICD-10-CM

## 2021-08-12 DIAGNOSIS — J189 Pneumonia, unspecified organism: Secondary | ICD-10-CM | POA: Diagnosis present

## 2021-08-12 DIAGNOSIS — Z8711 Personal history of peptic ulcer disease: Secondary | ICD-10-CM

## 2021-08-12 DIAGNOSIS — J9621 Acute and chronic respiratory failure with hypoxia: Secondary | ICD-10-CM | POA: Diagnosis present

## 2021-08-12 DIAGNOSIS — Z85118 Personal history of other malignant neoplasm of bronchus and lung: Secondary | ICD-10-CM

## 2021-08-12 DIAGNOSIS — T17890A Other foreign object in other parts of respiratory tract causing asphyxiation, initial encounter: Secondary | ICD-10-CM | POA: Diagnosis present

## 2021-08-12 DIAGNOSIS — E43 Unspecified severe protein-calorie malnutrition: Secondary | ICD-10-CM | POA: Diagnosis present

## 2021-08-12 DIAGNOSIS — E871 Hypo-osmolality and hyponatremia: Secondary | ICD-10-CM | POA: Diagnosis present

## 2021-08-12 DIAGNOSIS — E876 Hypokalemia: Secondary | ICD-10-CM | POA: Diagnosis present

## 2021-08-12 DIAGNOSIS — Z515 Encounter for palliative care: Secondary | ICD-10-CM | POA: Diagnosis not present

## 2021-08-12 DIAGNOSIS — I248 Other forms of acute ischemic heart disease: Secondary | ICD-10-CM | POA: Diagnosis present

## 2021-08-12 DIAGNOSIS — Z9981 Dependence on supplemental oxygen: Secondary | ICD-10-CM

## 2021-08-12 DIAGNOSIS — G9341 Metabolic encephalopathy: Secondary | ICD-10-CM | POA: Diagnosis not present

## 2021-08-12 DIAGNOSIS — Y92239 Unspecified place in hospital as the place of occurrence of the external cause: Secondary | ICD-10-CM | POA: Diagnosis present

## 2021-08-12 DIAGNOSIS — I4891 Unspecified atrial fibrillation: Secondary | ICD-10-CM | POA: Diagnosis present

## 2021-08-12 DIAGNOSIS — Z79899 Other long term (current) drug therapy: Secondary | ICD-10-CM

## 2021-08-12 DIAGNOSIS — I110 Hypertensive heart disease with heart failure: Principal | ICD-10-CM | POA: Diagnosis present

## 2021-08-12 DIAGNOSIS — R0600 Dyspnea, unspecified: Secondary | ICD-10-CM

## 2021-08-12 DIAGNOSIS — J432 Centrilobular emphysema: Secondary | ICD-10-CM | POA: Diagnosis present

## 2021-08-12 DIAGNOSIS — I2510 Atherosclerotic heart disease of native coronary artery without angina pectoris: Secondary | ICD-10-CM | POA: Diagnosis present

## 2021-08-12 DIAGNOSIS — I5032 Chronic diastolic (congestive) heart failure: Secondary | ICD-10-CM | POA: Diagnosis not present

## 2021-08-12 DIAGNOSIS — Z20822 Contact with and (suspected) exposure to covid-19: Secondary | ICD-10-CM | POA: Diagnosis present

## 2021-08-12 DIAGNOSIS — I4819 Other persistent atrial fibrillation: Secondary | ICD-10-CM | POA: Diagnosis present

## 2021-08-12 DIAGNOSIS — E872 Acidosis, unspecified: Secondary | ICD-10-CM | POA: Diagnosis present

## 2021-08-12 DIAGNOSIS — T501X5A Adverse effect of loop [high-ceiling] diuretics, initial encounter: Secondary | ICD-10-CM | POA: Diagnosis present

## 2021-08-12 DIAGNOSIS — F17210 Nicotine dependence, cigarettes, uncomplicated: Secondary | ICD-10-CM | POA: Diagnosis present

## 2021-08-12 DIAGNOSIS — E785 Hyperlipidemia, unspecified: Secondary | ICD-10-CM | POA: Diagnosis present

## 2021-08-12 DIAGNOSIS — L89152 Pressure ulcer of sacral region, stage 2: Secondary | ICD-10-CM | POA: Diagnosis not present

## 2021-08-12 DIAGNOSIS — F1010 Alcohol abuse, uncomplicated: Secondary | ICD-10-CM | POA: Diagnosis present

## 2021-08-12 DIAGNOSIS — R54 Age-related physical debility: Secondary | ICD-10-CM | POA: Diagnosis present

## 2021-08-12 DIAGNOSIS — Z66 Do not resuscitate: Secondary | ICD-10-CM | POA: Diagnosis present

## 2021-08-12 DIAGNOSIS — Z720 Tobacco use: Secondary | ICD-10-CM | POA: Diagnosis not present

## 2021-08-12 DIAGNOSIS — Z886 Allergy status to analgesic agent status: Secondary | ICD-10-CM

## 2021-08-12 DIAGNOSIS — R778 Other specified abnormalities of plasma proteins: Secondary | ICD-10-CM | POA: Diagnosis not present

## 2021-08-12 DIAGNOSIS — I5033 Acute on chronic diastolic (congestive) heart failure: Secondary | ICD-10-CM | POA: Diagnosis present

## 2021-08-12 DIAGNOSIS — A419 Sepsis, unspecified organism: Secondary | ICD-10-CM | POA: Diagnosis present

## 2021-08-12 DIAGNOSIS — R1310 Dysphagia, unspecified: Secondary | ICD-10-CM | POA: Diagnosis present

## 2021-08-12 DIAGNOSIS — J90 Pleural effusion, not elsewhere classified: Secondary | ICD-10-CM | POA: Diagnosis present

## 2021-08-12 DIAGNOSIS — Z902 Acquired absence of lung [part of]: Secondary | ICD-10-CM

## 2021-08-12 DIAGNOSIS — Z6820 Body mass index (BMI) 20.0-20.9, adult: Secondary | ICD-10-CM

## 2021-08-12 DIAGNOSIS — R339 Retention of urine, unspecified: Secondary | ICD-10-CM | POA: Diagnosis not present

## 2021-08-12 DIAGNOSIS — J441 Chronic obstructive pulmonary disease with (acute) exacerbation: Secondary | ICD-10-CM | POA: Diagnosis present

## 2021-08-12 DIAGNOSIS — Z7189 Other specified counseling: Secondary | ICD-10-CM | POA: Diagnosis not present

## 2021-08-12 LAB — PROCALCITONIN: Procalcitonin: <0.1 ng/mL

## 2021-08-12 LAB — CULTURE, BLOOD (ROUTINE X 2)
Culture: NO GROWTH
Culture: NO GROWTH
Special Requests: ADEQUATE

## 2021-08-12 LAB — BASIC METABOLIC PANEL
Anion gap: 10 (ref 5–15)
Anion gap: 13 (ref 5–15)
BUN: 15 mg/dL (ref 8–23)
BUN: 14 mg/dL (ref 8–23)
CO2: 31 mmol/L (ref 22–32)
CO2: 29 mmol/L (ref 22–32)
Calcium: 8.6 mg/dL — ABNORMAL LOW (ref 8.9–10.3)
Calcium: 8.4 mg/dL — ABNORMAL LOW (ref 8.9–10.3)
Chloride: 85 mmol/L — ABNORMAL LOW (ref 98–111)
Chloride: 86 mmol/L — ABNORMAL LOW (ref 98–111)
Creatinine, Ser: 0.63 mg/dL (ref 0.44–1.00)
Creatinine, Ser: 0.67 mg/dL (ref 0.44–1.00)
GFR, Estimated: >60 mL/min (ref >60)
GFR, Estimated: >60 mL/min (ref >60)
Glucose, Bld: 101 mg/dL — ABNORMAL HIGH (ref 70–99)
Glucose, Bld: 121 mg/dL — ABNORMAL HIGH (ref 70–99)
Potassium: 3.4 mmol/L — ABNORMAL LOW (ref 3.5–5.1)
Potassium: 3.7 mmol/L (ref 3.5–5.1)
Sodium: 126 mmol/L — ABNORMAL LOW (ref 135–145)
Sodium: 128 mmol/L — ABNORMAL LOW (ref 135–145)

## 2021-08-12 LAB — COMPREHENSIVE METABOLIC PANEL
ALT: 40 U/L (ref 0–44)
AST: 23 U/L (ref 15–41)
Albumin: 4.7 g/dL (ref 3.5–5.0)
Alkaline Phosphatase: 159 U/L — ABNORMAL HIGH (ref 38–126)
Anion gap: 11 (ref 5–15)
BUN: 16 mg/dL (ref 8–23)
CO2: 31 mmol/L (ref 22–32)
Calcium: 9.3 mg/dL (ref 8.9–10.3)
Chloride: 85 mmol/L — ABNORMAL LOW (ref 98–111)
Creatinine, Ser: 0.6 mg/dL (ref 0.44–1.00)
GFR, Estimated: >60 mL/min (ref >60)
Glucose, Bld: 119 mg/dL — ABNORMAL HIGH (ref 70–99)
Potassium: 4.4 mmol/L (ref 3.5–5.1)
Sodium: 127 mmol/L — ABNORMAL LOW (ref 135–145)
Total Bilirubin: 1.1 mg/dL (ref 0.3–1.2)
Total Protein: 8.2 g/dL — ABNORMAL HIGH (ref 6.5–8.1)

## 2021-08-12 LAB — TROPONIN I (HIGH SENSITIVITY)
Troponin I (High Sensitivity): 19 ng/L — ABNORMAL HIGH (ref <18)
Troponin I (High Sensitivity): 23 ng/L — ABNORMAL HIGH (ref <18)
Troponin I (High Sensitivity): 18 ng/L — ABNORMAL HIGH (ref <18)

## 2021-08-12 LAB — CBC
HCT: 39.5 % (ref 36.0–46.0)
Hemoglobin: 13.6 g/dL (ref 12.0–15.0)
MCH: 32.9 pg (ref 26.0–34.0)
MCHC: 34.4 g/dL (ref 30.0–36.0)
MCV: 95.4 fL (ref 80.0–100.0)
Platelets: 286 10*3/uL (ref 150–400)
RBC: 4.14 MIL/uL (ref 3.87–5.11)
RDW: 15.4 % (ref 11.5–15.5)
WBC: 17.7 10*3/uL — ABNORMAL HIGH (ref 4.0–10.5)
nRBC: 0 % (ref 0.0–0.2)

## 2021-08-12 LAB — RESP PANEL BY RT-PCR (FLU A&B, COVID) ARPGX2
Influenza A by PCR: NEGATIVE
Influenza B by PCR: NEGATIVE
SARS Coronavirus 2 by RT PCR: NEGATIVE

## 2021-08-12 LAB — LACTIC ACID, PLASMA: Lactic Acid, Venous: 2.3 mmol/L (ref 0.5–1.9)

## 2021-08-12 LAB — MAGNESIUM: Magnesium: 2.1 mg/dL (ref 1.7–2.4)

## 2021-08-12 LAB — BRAIN NATRIURETIC PEPTIDE: B Natriuretic Peptide: 768 pg/mL — ABNORMAL HIGH (ref 0.0–100.0)

## 2021-08-12 LAB — PHOSPHORUS: Phosphorus: 2.2 mg/dL — ABNORMAL LOW (ref 2.5–4.6)

## 2021-08-12 MED ORDER — POTASSIUM CHLORIDE CRYS ER 20 MEQ PO TBCR: 40.0000 meq | EXTENDED_RELEASE_TABLET | Freq: Once | ORAL | Status: DC

## 2021-08-12 MED ORDER — IPRATROPIUM-ALBUTEROL 0.5-2.5 (3) MG/3ML IN SOLN
3.0000 mL | Freq: Once | RESPIRATORY_TRACT | Status: AC
  Administered 2021-08-12: 3 mL via RESPIRATORY_TRACT
  Filled 2021-08-12: qty 9

## 2021-08-12 MED ORDER — ONDANSETRON HCL 4 MG/2ML IJ SOLN: 4.0000 mg | Freq: Three times a day (TID) | INTRAMUSCULAR | Status: DC | PRN

## 2021-08-12 MED ORDER — IPRATROPIUM-ALBUTEROL 0.5-2.5 (3) MG/3ML IN SOLN
3.0000 mL | Freq: Once | RESPIRATORY_TRACT | Status: AC
  Administered 2021-08-12: 3 mL via RESPIRATORY_TRACT

## 2021-08-12 MED ORDER — ADULT MULTIVITAMIN W/MINERALS CH
1.0000 | ORAL_TABLET | Freq: Every day | ORAL | Status: DC
  Administered 2021-08-14 – 2021-08-20 (×6): 1 via ORAL
  Filled 2021-08-12 (×7): qty 1

## 2021-08-12 MED ORDER — ALBUTEROL SULFATE (2.5 MG/3ML) 0.083% IN NEBU: 2.5000 mg | INHALATION_SOLUTION | RESPIRATORY_TRACT | Status: DC | PRN

## 2021-08-12 MED ORDER — DM-GUAIFENESIN ER 30-600 MG PO TB12: 1.0000 | ORAL_TABLET | Freq: Two times a day (BID) | ORAL | Status: DC | PRN

## 2021-08-12 MED ORDER — NICOTINE 21 MG/24HR TD PT24
21.0000 mg | MEDICATED_PATCH | Freq: Every day | TRANSDERMAL | Status: DC
  Administered 2021-08-12 – 2021-08-20 (×9): 21 mg via TRANSDERMAL
  Filled 2021-08-12 (×9): qty 1

## 2021-08-12 MED ORDER — THIAMINE HCL 100 MG PO TABS
100.0000 mg | ORAL_TABLET | Freq: Every day | ORAL | Status: DC
  Administered 2021-08-14 – 2021-08-20 (×6): 100 mg via ORAL
  Filled 2021-08-12 (×7): qty 1

## 2021-08-12 MED ORDER — IPRATROPIUM-ALBUTEROL 0.5-2.5 (3) MG/3ML IN SOLN
3.0000 mL | RESPIRATORY_TRACT | Status: DC
  Administered 2021-08-12 – 2021-08-14 (×4): 3 mL via RESPIRATORY_TRACT
  Filled 2021-08-12 (×5): qty 3

## 2021-08-12 MED ORDER — LORAZEPAM 2 MG/ML IJ SOLN
0.0000 mg | Freq: Two times a day (BID) | INTRAMUSCULAR | Status: AC
  Administered 2021-08-15: 2 mg via INTRAVENOUS
  Filled 2021-08-12: qty 1

## 2021-08-12 MED ORDER — METHYLPREDNISOLONE SODIUM SUCC 125 MG IJ SOLR
125.0000 mg | Freq: Once | INTRAMUSCULAR | Status: AC
  Administered 2021-08-12: 125 mg via INTRAVENOUS
  Filled 2021-08-12: qty 2

## 2021-08-12 MED ORDER — LORAZEPAM 2 MG/ML IJ SOLN
0.0000 mg | Freq: Four times a day (QID) | INTRAMUSCULAR | Status: AC
Start: 2021-08-12 — End: 2021-08-14
  Administered 2021-08-12: 1 mg via INTRAVENOUS
  Administered 2021-08-14: 2 mg via INTRAVENOUS
  Filled 2021-08-12 (×3): qty 1

## 2021-08-12 MED ORDER — FOLIC ACID 1 MG PO TABS
1.0000 mg | ORAL_TABLET | Freq: Every day | ORAL | Status: DC
  Administered 2021-08-14 – 2021-08-20 (×6): 1 mg via ORAL
  Filled 2021-08-12 (×6): qty 1

## 2021-08-12 MED ORDER — THIAMINE HCL 100 MG/ML IJ SOLN
100.0000 mg | Freq: Every day | INTRAMUSCULAR | Status: DC
  Administered 2021-08-13: 100 mg via INTRAVENOUS
  Filled 2021-08-12 (×2): qty 2

## 2021-08-12 MED ORDER — FUROSEMIDE 10 MG/ML IJ SOLN: 40.0000 mg | Freq: Every day | INTRAMUSCULAR | Status: DC

## 2021-08-12 MED ORDER — FUROSEMIDE 10 MG/ML IJ SOLN
40.0000 mg | Freq: Two times a day (BID) | INTRAMUSCULAR | Status: DC
  Administered 2021-08-12 – 2021-08-17 (×12): 40 mg via INTRAVENOUS
  Filled 2021-08-12 (×12): qty 4

## 2021-08-12 MED ORDER — METHYLPREDNISOLONE SODIUM SUCC 40 MG IJ SOLR
40.0000 mg | Freq: Two times a day (BID) | INTRAMUSCULAR | Status: DC
  Administered 2021-08-12 – 2021-08-20 (×16): 40 mg via INTRAVENOUS
  Filled 2021-08-12 (×16): qty 1

## 2021-08-12 MED ORDER — ACETAMINOPHEN 325 MG PO TABS: 650.0000 mg | ORAL_TABLET | Freq: Four times a day (QID) | ORAL | Status: DC | PRN

## 2021-08-12 MED ORDER — LORAZEPAM 2 MG/ML IJ SOLN
1.0000 mg | INTRAMUSCULAR | Status: AC | PRN
  Administered 2021-08-13: 1 mg via INTRAVENOUS
  Administered 2021-08-13: 2 mg via INTRAVENOUS
  Filled 2021-08-12: qty 1

## 2021-08-12 MED ORDER — PIPERACILLIN-TAZOBACTAM 3.375 G IVPB
3.3750 g | Freq: Three times a day (TID) | INTRAVENOUS | Status: DC
  Administered 2021-08-12 – 2021-08-13 (×3): 3.375 g via INTRAVENOUS
  Filled 2021-08-12 (×3): qty 50

## 2021-08-12 MED ORDER — SODIUM CHLORIDE 0.9 % IV SOLN: INTRAVENOUS | Status: DC | PRN

## 2021-08-12 MED ORDER — SODIUM CHLORIDE 1 G PO TABS
1.0000 g | ORAL_TABLET | Freq: Two times a day (BID) | ORAL | Status: DC
  Filled 2021-08-12: qty 1

## 2021-08-12 MED ORDER — PREDNISONE 20 MG PO TABS: 60.0000 mg | ORAL_TABLET | Freq: Once | ORAL | Status: DC

## 2021-08-12 MED ORDER — HYDRALAZINE HCL 20 MG/ML IJ SOLN: 5.0000 mg | INTRAMUSCULAR | Status: DC | PRN

## 2021-08-12 MED ORDER — LORAZEPAM 1 MG PO TABS: 1.0000 mg | ORAL_TABLET | ORAL | Status: AC | PRN

## 2021-08-12 NOTE — ED Notes (Signed)
Pt attempting to get out of bed. Pt given dinner tray. Pt stating they do not want dinner. They want to get up. Pt assisted to sitting in bed.

## 2021-08-12 NOTE — ED Notes (Signed)
Pt titrated to 3L [place]. Both pt Ivs not working. Upper IV inflitrated and lower unable to flush

## 2021-08-12 NOTE — ED Notes (Signed)
Pt attemptong to pull out IV. Pt refusing [place]. Pt asking me to leave them alone.

## 2021-08-12 NOTE — ED Notes (Signed)
Pt transferred to room 32. Report given at bedside.

## 2021-08-12 NOTE — ED Notes (Signed)
RT in room. Pt on bi-pap

## 2021-08-12 NOTE — ED Notes (Signed)
Pt has weak cough

## 2021-08-12 NOTE — NC FL2 (Signed)
[place] LEVEL OF CARE SCREENING TOOL     IDENTIFICATION  Patient Name: Felicia Sellers Birthdate: 1941-01-28 Sex: female Admission Date (Current Location): 08/12/2021  [place] and [place] Number:  Felicia Sellers [id number] [place] and Address:  [facility], [address] [place],  [address]      Provider Number: [id number]  Attending Physician Name and Address:  Ivor Costa, MD  Relative Name and Phone Number:  Felicia Sellers [facility]) [phone]    Current Level of Care: Hospital Recommended Level of Care: [place] Prior Approval Number:    Date Approved/Denied:   PASRR Number: [id number]  Discharge Plan: SNF    Current Diagnoses: Patient Active Problem List   Diagnosis Date Noted   COPD exacerbation ([place]) 08/12/2021   Sepsis ([place]) 08/12/2021   Atrial fibrillation with RVR ([place]) 08/12/2021   Hyponatremia 08/12/2021   Elevated troponin 08/12/2021   HLD (hyperlipidemia) 08/12/2021   CAD (coronary artery disease) 08/12/2021   Hypokalemia 08/12/2021   Pleural effusion 08/12/2021   Protein-calorie malnutrition, severe 08/09/2021   Acute on chronic diastolic CHF (congestive heart failure) ([place]) 08/08/2021   Tobacco dependence 08/08/2021   Alcohol abuse 08/08/2021   Aortic atherosclerosis ([place]) 08/08/2021   Emphysema ([place]) 08/08/2021   Unspecified atrial fibrillation ([place]) 08/08/2021   Lung mass 08/08/2021   Aortic aneurysm ([place]) 08/08/2021   Ovarian mass 08/08/2021   Acute respiratory failure with hypoxia ([place]) 08/08/2021   COPD with acute exacerbation ([facility]) 08/07/2021   Pleural effusion on left 08/07/2021   Acute on chronic respiratory failure with hypoxia ([facility]) 07/09/2021   Coronary artery disease involving native coronary artery of native heart without angina pectoris 07/19/2020   COPD, moderate ([place]) 07/19/2020   Personal history of lung cancer 07/19/2020   Leukocytosis 06/26/2020   Thoracic  aortic aneurysm without rupture 08/16/2018   Kyphosis 08/07/2018   Hypertension 08/07/2018   Tobacco use 08/07/2018   Age-related osteoporosis without current pathological fracture 12/21/2016   Chronic hyponatremia 11/04/2015   Chronic low back pain 06/18/2015   Cardiomyopathy ([place]) 08/13/2014   Left bundle branch block (LBBB) 07/23/2014   Pedal edema 07/23/2014   SOB (shortness of breath) on exertion 07/23/2014    Orientation RESPIRATION BLADDER Height & Weight     Self, Time, Situation, Place  Normal Continent Weight: 113 lb 12.1 oz (51.6 kg) Height:  5\' 3"  (160 cm)  BEHAVIORAL SYMPTOMS/MOOD NEUROLOGICAL BOWEL NUTRITION STATUS      Continent Diet  AMBULATORY STATUS COMMUNICATION OF NEEDS Skin   Limited Assist Verbally Normal                       Personal Care Assistance Level of Assistance  Bathing, Feeding, Dressing, Total care Bathing Assistance: Limited assistance Feeding assistance: Independent Dressing Assistance: Limited assistance Total Care Assistance: Limited assistance   Functional Limitations Info  Sight, Hearing, Speech Sight Info: Adequate Hearing Info: Adequate Speech Info: Adequate    SPECIAL CARE FACTORS FREQUENCY  PT (By licensed PT), OT (By licensed OT)     PT Frequency: 5X per week OT Frequency: 5X per week            Contractures Contractures Info: Not present    Additional Factors Info        Moderna COVID-19 Vaccine 09/15/2020 , 12/27/2019 , 11/29/2019             Current Medications (08/12/2021):  This is the current hospital active medication list Current Facility-Administered Medications  Medication  Dose Route Frequency Provider Last Rate Last Admin   acetaminophen (TYLENOL) tablet 650 mg  650 mg Oral Q6H PRN Ivor Costa, MD       albuterol (PROVENTIL) (2.5 MG/3ML) 0.083% nebulizer solution 2.5 mg  2.5 mg Nebulization Q4H PRN Ivor Costa, MD       dextromethorphan-guaiFENesin ([place] DM) 30-600 MG per 12 hr tablet 1 tablet   1 tablet Oral BID PRN Ivor Costa, MD       folic acid (FOLVITE) tablet 1 mg  1 mg Oral Daily Ivor Costa, MD       furosemide (LASIX) injection 40 mg  40 mg Intravenous Q12H Ivor Costa, MD   40 mg at 08/12/21 1136   hydrALAZINE (APRESOLINE) injection 5 mg  5 mg Intravenous Q2H PRN Ivor Costa, MD       ipratropium-albuterol (DUONEB) 0.5-2.5 (3) MG/3ML nebulizer solution 3 mL  3 mL Nebulization Q4H Ivor Costa, MD   3 mL at 08/12/21 1137   LORazepam (ATIVAN) injection 0-4 mg  0-4 mg Intravenous Q6H Ivor Costa, MD   1 mg at 08/12/21 1341   Followed by   Derrill Memo ON 08/14/2021] LORazepam (ATIVAN) injection 0-4 mg  0-4 mg Intravenous Q12H Ivor Costa, MD       LORazepam (ATIVAN) tablet 1-4 mg  1-4 mg Oral Q1H PRN Ivor Costa, MD       Or   LORazepam (ATIVAN) injection 1-4 mg  1-4 mg Intravenous Q1H PRN Ivor Costa, MD       methylPREDNISolone sodium succinate (SOLU-MEDROL) 40 mg/mL injection 40 mg  40 mg Intravenous Q12H Ivor Costa, MD       multivitamin with minerals tablet 1 tablet  1 tablet Oral Daily Ivor Costa, MD       nicotine (NICODERM CQ - dosed in mg/24 hours) patch 21 mg  21 mg Transdermal Daily Ivor Costa, MD   21 mg at 08/12/21 1137   ondansetron (ZOFRAN) injection 4 mg  4 mg Intravenous Q8H PRN Ivor Costa, MD       piperacillin-tazobactam (ZOSYN) IVPB 3.375 g  3.375 g Intravenous Cleophas Dunker, MD 12.5 mL/hr at 08/12/21 1500 3.375 g at 08/12/21 1500   potassium chloride SA (KLOR-CON) CR tablet 40 mEq  40 mEq Oral Once Ivor Costa, MD       thiamine tablet 100 mg  100 mg Oral Daily Ivor Costa, MD       Or   thiamine (B-1) injection 100 mg  100 mg Intravenous Daily Ivor Costa, MD       Current Outpatient Medications  Medication Sig Dispense Refill   Albuterol Sulfate (PROAIR RESPICLICK) 431 (90 Base) MCG/ACT AEPB Inhale 2 puffs into the lungs every 6 (six) hours as needed. 1 each 11   amLODipine (NORVASC) 10 MG tablet Take 10 mg by mouth daily.     amoxicillin-clavulanate (AUGMENTIN) 500-125  MG tablet Take 1 tablet (500 mg total) by mouth every 8 (eight) hours for 2 days. 6 tablet 0   atorvastatin (LIPITOR) 40 MG tablet Take 40 mg by mouth at bedtime.     calcium carbonate (OS-CAL - DOSED IN MG OF ELEMENTAL CALCIUM) 1250 (500 Ca) MG tablet Take 1 tablet by mouth 2 (two) times daily with a meal.      Cholecalciferol 1000 units capsule Take 1,000 Units by mouth daily.     diclofenac Sodium (VOLTAREN) 1 % GEL Apply topically 2 (two) times daily. Apply to upper back and shoulders every morning and at bedtime  related to muscle weakness.     docusate sodium (COLACE) 100 MG capsule Take 100 mg by mouth daily.     gabapentin (NEURONTIN) 300 MG capsule Take 300 mg by mouth 3 (three) times daily.     ipratropium (ATROVENT) 0.02 % nebulizer solution Take 0.5 mg by nebulization every 12 (twelve) hours as needed for wheezing or shortness of breath.     lisinopril (ZESTRIL) 10 MG tablet Take 10 mg by mouth daily.      loperamide (IMODIUM) 2 MG capsule Take 4 mg by mouth as needed for diarrhea or loose stools.     metoprolol succinate (TOPROL-XL) 25 MG 24 hr tablet Take 25 mg by mouth daily.     predniSONE (DELTASONE) 20 MG tablet Take 2 tablets (40 mg total) by mouth daily for 5 days. 10 tablet 0   sodium chloride 1 g tablet Take 2 g by mouth 3 (three) times daily.     sulfamethoxazole-trimethoprim (BACTRIM DS) 800-160 MG tablet Take 1 tablet by mouth every 12 (twelve) hours for 4 days. 8 tablet 0     Discharge Medications: Please see discharge summary for a list of discharge medications.  Relevant Imaging Results:  Relevant Lab Results:   Additional Information SS# [id number]  Adelene Amas, LCSWA

## 2021-08-12 NOTE — ED Notes (Addendum)
PT attempting to get out of bed. PT placed on posey monitor as there were none present in any rooms this RN was assigned. PT placed on posey, repositioned. Pt re-briefed as they have remove their brief and purekwick. Pt refusing purwick stating that "ive been pissing myself for three days." Pt reminded that they have been on the pure wick since I assumed care. Pt asked This RN for more blankets and to leave them alone.

## 2021-08-12 NOTE — ED Notes (Signed)
Lab aware of BNP add on

## 2021-08-12 NOTE — H&P (Signed)
History and Physical    [place] LHT:[id number] DOB: 06-07-1941 DOA: 08/12/2021  Referring MD/NP/PA:   PCP: [facility]   Patient coming from:  The patient is coming from home.  At baseline, pt is independent for most of ADL.        Chief Complaint: Shortness of breath  HPI: Felicia Sellers is a 80 y.o. female with medical history significant of COPD on 4 L oxygen, hypertension, hyperlipidemia, atrial fibrillation on anticoagulants, stomach ulcer, CAD, CHF, lung cancer (s/p of right lobectomy, 1/3 of right lung), tobacco abuse, alcohol abuse, who presents with shortness breath.  Patient was hospitalized from 10/16 to 10/20 due to COPD exacerbation.  Also had large left pleural effusion and underwent thoracentesis. Sputum cx was positive for acinetobacter. Pt was discharged on Augmentin and bactrim.  Patient comes back today with worsening shortness of breath.  Patient was found to have severe respiratory distress, using accessory muscle for breathing, cannot speak in full sentence.  BiPAP started in ED. Patient does not have chest pain, fever or chills.  No nausea vomiting, diarrhea or abdominal pain.  No symptoms of UTI.  Patient has bilateral lower leg edema.  ED Course: pt was found to have WBC 10.2, negative COVID PCR, troponin level 19, 23, sodium 127, renal function okay, temperature normal, blood pressure 166/114, heart rate 111, RR 27, chest x-ray showed increased interstitial edema and bilateral pleural effusion.  Patient is admitted to progressive bed as inpatient.  Review of Systems:   General: no fevers, chills,  has poor appetite, has fatigue HEENT: no blurry vision, hearing changes or sore throat Respiratory: has dyspnea, coughing, no wheezing CV: no chest pain, no palpitations GI: no nausea, vomiting, abdominal pain, diarrhea, constipation GU: no dysuria, burning on urination, increased urinary frequency, hematuria  Ext: has leg edema Neuro: no unilateral  weakness, numbness, or tingling, no vision change or hearing loss Skin: no rash, no skin tear. MSK: No muscle spasm, no deformity, no limitation of range of movement in spin Heme: No easy bruising.  Travel history: No recent long distant travel.  Allergy:  Allergies  Allergen Reactions   Aspirin Other (See Comments)    High risk of bleeding    Past Medical History:  Diagnosis Date   Aortic aneurysm ([place]) 08/08/2021   Aortic atherosclerosis ([place]) 08/08/2021   Cancer ([place])    right lung removed - lung cancer 1/3 lung removed    CHF (congestive heart failure) (HCC)    Chronic diastolic CHF (congestive heart failure) ([place]) 08/08/2021   Chronic hyponatremia 11/04/2015   Overview:  Normal serum oslmolality   COPD (chronic obstructive pulmonary disease) (HCC)    Coronary artery disease    Emphysema ([place]) 08/08/2021   History of stomach ulcers    Hypertension    Lung mass 08/08/2021   Ovarian mass 08/08/2021   Pneumonia    Unspecified atrial fibrillation ([place]) 08/08/2021    Past Surgical History:  Procedure Laterality Date   LUNG REMOVAL, PARTIAL Left     Social History:  reports that she quit smoking about 13 months ago. Her smoking use included cigarettes. She has a 66.00 pack-year smoking history. She has never used smokeless tobacco. She reports that she does not currently use alcohol. She reports that she does not use drugs.  Family History: I have tried to review with patient about family medical history, but patient cannot provide detailed information. Family History  Problem Relation Age of Onset   Breast cancer  Neg Hx      Prior to Admission medications   Medication Sig Start Date End Date Taking? Authorizing Provider  Albuterol Sulfate (PROAIR RESPICLICK) 673 (90 Base) MCG/ACT AEPB Inhale 2 puffs into the lungs every 6 (six) hours as needed. 06/28/20   Danford, Suann Larry, MD  amLODipine (NORVASC) 10 MG tablet Take 10 mg by mouth daily.    [provider]   amoxicillin-clavulanate (AUGMENTIN) 500-125 MG tablet Take 1 tablet (500 mg total) by mouth every 8 (eight) hours for 2 days. 08/11/21 08/13/21  Wyvonnia Dusky, MD  atorvastatin (LIPITOR) 40 MG tablet Take 40 mg by mouth at bedtime.    [provider]  calcium carbonate (OS-CAL - DOSED IN MG OF ELEMENTAL CALCIUM) 1250 (500 Ca) MG tablet Take 1 tablet by mouth 2 (two) times daily with a meal.     [provider]  Cholecalciferol 1000 units capsule Take 1,000 Units by mouth daily.    [provider]  diclofenac Sodium (VOLTAREN) 1 % GEL Apply topically 2 (two) times daily. Apply to upper back and shoulders every morning and at bedtime related to muscle weakness.    [provider]  docusate sodium (COLACE) 100 MG capsule Take 100 mg by mouth daily.    [provider]  gabapentin (NEURONTIN) 300 MG capsule Take 300 mg by mouth 3 (three) times daily.    [provider]  ipratropium (ATROVENT) 0.02 % nebulizer solution Take 0.5 mg by nebulization every 12 (twelve) hours as needed for wheezing or shortness of breath.    [provider]  lisinopril (ZESTRIL) 10 MG tablet Take 10 mg by mouth daily.     [provider]  loperamide (IMODIUM) 2 MG capsule Take 4 mg by mouth as needed for diarrhea or loose stools.    [provider]  metoprolol succinate (TOPROL-XL) 25 MG 24 hr tablet Take 25 mg by mouth daily.    [provider]  predniSONE (DELTASONE) 20 MG tablet Take 2 tablets (40 mg total) by mouth daily for 5 days. 08/11/21 08/16/21  Wyvonnia Dusky, MD  sodium chloride 1 g tablet Take 2 g by mouth 3 (three) times daily.    [provider]  sulfamethoxazole-trimethoprim (BACTRIM DS) 800-160 MG tablet Take 1 tablet by mouth every 12 (twelve) hours for 4 days. 08/11/21 08/15/21  Wyvonnia Dusky, MD    Physical Exam: Vitals:   08/12/21 4193 08/12/21 0641 08/12/21 0645 08/12/21 0657  BP:   (!)  166/114   Pulse:   (!) 101   Resp:   (!) 27   Temp:    97.9 F (36.6 C)  TempSrc:    Axillary  SpO2: 96%  97%   Weight:  51.6 kg    Height:  5\' 3"  (1.6 m)     General: In acute respiratory distress HEENT:       Eyes: PERRL, EOMI, no scleral icterus.       ENT: No discharge from the ears and nose, no pharynx injection, no tonsillar enlargement.        Neck: Positive JVD, no bruit, no mass felt. Heme: No neck lymph node enlargement. Cardiac: S1/S2, RRR, No murmurs, No gallops or rubs. Respiratory: Has rhonchi and fine crackles bilaterally, has decreased air movement bilaterally GI: Soft, nondistended, nontender, no rebound pain, no organomegaly, BS present. GU: No hematuria Ext: 2+ pitting leg edema bilaterally. 1+DP/PT pulse bilaterally. Musculoskeletal: No joint deformities, No joint redness or warmth, no limitation  of ROM in spin. Skin: No rashes.  Neuro: Alert, oriented X3, cranial nerves II-XII grossly intact, moves all extremities normally. Psych: Patient is not psychotic, no suicidal or hemocidal ideation.  Labs on Admission: I have personally reviewed following labs and imaging studies  CBC: Recent Labs  Lab 08/07/21 0435 08/08/21 0651 08/11/21 0405  WBC 7.4 5.4 10.2  NEUTROABS 5.1  --   --   HGB 11.5* 12.3 12.8  HCT 33.7* 34.6* 37.9  MCV 96.0 93.5 93.6  PLT 255 261 144   Basic Metabolic Panel: Recent Labs  Lab 08/07/21 0435 08/08/21 0651 08/11/21 0405 08/12/21 0625 08/12/21 0642 08/12/21 0911  NA 130* 129* 126* 127*  --  126*  K 4.1 3.8 4.1 4.4  --  3.4*  CL 96* 91* 89* 85*  --  85*  CO2 26 28 30 31   --  31  GLUCOSE 110* 130* 83 119*  --  101*  BUN 17 18 20 16   --  15  CREATININE 0.67 0.75 0.61 0.60  --  0.63  CALCIUM 8.8* 8.5* 8.4* 9.3  --  8.6*  MG  --   --   --   --  2.1  --   PHOS  --   --   --   --  2.2*  --    GFR: Estimated Creatinine Clearance: 44.2 mL/min (by C-G formula based on SCr of 0.63 mg/dL). Liver Function Tests: Recent Labs   Lab 08/12/21 0625  AST 23  ALT 40  ALKPHOS 159*  BILITOT 1.1  PROT 8.2*  ALBUMIN 4.7   No results for input(s): LIPASE, AMYLASE in the last 168 hours. No results for input(s): AMMONIA in the last 168 hours. Coagulation Profile: No results for input(s): INR, PROTIME in the last 168 hours. Cardiac Enzymes: No results for input(s): CKTOTAL, CKMB, CKMBINDEX, TROPONINI in the last 168 hours. BNP (last 3 results) No results for input(s): PROBNP in the last 8760 hours. HbA1C: No results for input(s): HGBA1C in the last 72 hours. CBG: Recent Labs  Lab 08/10/21 2133  GLUCAP 107*   Lipid Profile: No results for input(s): CHOL, HDL, LDLCALC, TRIG, CHOLHDL, LDLDIRECT in the last 72 hours. Thyroid Function Tests: No results for input(s): TSH, T4TOTAL, FREET4, T3FREE, THYROIDAB in the last 72 hours. Anemia Panel: No results for input(s): VITAMINB12, FOLATE, FERRITIN, TIBC, IRON, RETICCTPCT in the last 72 hours. Urine analysis:    Component Value Date/Time   COLORURINE YELLOW 07/09/2021 [place] 07/09/2021 0857   APPEARANCEUR Clear 05/28/2014 0855   LABSPEC 1.010 07/09/2021 0857   LABSPEC 1.013 05/28/2014 0855   PHURINE 5.5 07/09/2021 [place] 07/09/2021 0857   GLUCOSEU Negative 05/28/2014 0855   HGBUR NEGATIVE 07/09/2021 0857   BILIRUBINUR NEGATIVE 07/09/2021 0857   BILIRUBINUR Negative 05/28/2014 0855   KETONESUR NEGATIVE 07/09/2021 0857   PROTEINUR NEGATIVE 07/09/2021 0857   NITRITE NEGATIVE 07/09/2021 0857   LEUKOCYTESUR NEGATIVE 07/09/2021 0857   LEUKOCYTESUR Negative 05/28/2014 0855   Sepsis Labs: @LABRCNTIP (procalcitonin:4,lacticidven:4) ) Recent Results (from the past 240 hour(s))  Resp Panel by RT-PCR (Flu A&B, Covid) Nasopharyngeal Swab     Status: None   Collection Time: 08/07/21  4:13 AM   Specimen: Nasopharyngeal Swab; Nasopharyngeal(NP) swabs in vial transport medium  Result Value Ref Range Status   SARS Coronavirus 2 by RT  PCR NEGATIVE NEGATIVE Final    Comment: (NOTE) SARS-CoV-2 target nucleic acids are NOT DETECTED.  The SARS-CoV-2 RNA is generally detectable  in upper respiratory specimens during the acute phase of infection. The lowest concentration of SARS-CoV-2 viral copies this assay can detect is 138 copies/mL. A negative result does not preclude SARS-Cov-2 infection and should not be used as the sole basis for treatment or other patient management decisions. A negative result may occur with  improper specimen collection/handling, submission of specimen other than nasopharyngeal swab, presence of viral mutation(s) within the areas targeted by this assay, and inadequate number of viral copies(<138 copies/mL). A negative result must be combined with clinical observations, patient history, and epidemiological information. The expected result is Negative.  Fact Sheet for Patients:  EntrepreneurPulse.com.au  Fact Sheet for Healthcare Providers:  IncredibleEmployment.be  This test is no t yet approved or cleared by the [place] FDA and  has been authorized for detection and/or diagnosis of SARS-CoV-2 by FDA under an Emergency Use Authorization (EUA). This EUA will remain  in effect (meaning this test can be used) for the duration of the COVID-19 declaration under Section 564(b)(1) of the Act, 21 U.S.C.section 360bbb-3(b)(1), unless the authorization is terminated  or revoked sooner.       Influenza A by PCR NEGATIVE NEGATIVE Final   Influenza B by PCR NEGATIVE NEGATIVE Final    Comment: (NOTE) The Xpert Xpress SARS-CoV-2/FLU/RSV plus assay is intended as an aid in the diagnosis of influenza from Nasopharyngeal swab specimens and should not be used as a sole basis for treatment. Nasal washings and aspirates are unacceptable for Xpert Xpress SARS-CoV-2/FLU/RSV testing.  Fact Sheet for Patients: EntrepreneurPulse.com.au  Fact Sheet for  Healthcare Providers: IncredibleEmployment.be  This test is not yet approved or cleared by the [place] FDA and has been authorized for detection and/or diagnosis of SARS-CoV-2 by FDA under an Emergency Use Authorization (EUA). This EUA will remain in effect (meaning this test can be used) for the duration of the COVID-19 declaration under Section 564(b)(1) of the Act, 21 U.S.C. section 360bbb-3(b)(1), unless the authorization is terminated or revoked.  Performed at [facility], [address] [place], [place] [address]   Culture, blood (Routine X 2) w Reflex to ID Panel     Status: None   Collection Time: 08/07/21  4:36 AM   Specimen: BLOOD RIGHT HAND  Result Value Ref Range Status   Specimen Description BLOOD RIGHT HAND  Final   Special Requests   Final    BOTTLES DRAWN AEROBIC AND ANAEROBIC Blood Culture results may not be optimal due to an inadequate volume of blood received in culture bottles   Culture   Final    NO GROWTH 5 DAYS Performed at [facility], [address], [place], [place] [address]    Report Status 08/12/2021 FINAL  Final  Culture, blood (Routine X 2) w Reflex to ID Panel     Status: None   Collection Time: 08/07/21  5:07 AM   Specimen: BLOOD RIGHT FOREARM  Result Value Ref Range Status   Specimen Description BLOOD RIGHT FOREARM  Final   Special Requests   Final    BOTTLES DRAWN AEROBIC AND ANAEROBIC Blood Culture adequate volume   Culture   Final    NO GROWTH 5 DAYS Performed at [facility], [address], [place], [place] [address]    Report Status 08/12/2021 FINAL  Final  Expectorated Sputum Assessment w Gram Stain, Rflx to Resp Cult     Status: None   Collection Time: 08/07/21  8:32 PM   Specimen: Sputum  Result Value Ref Range  Status   Specimen Description SPUTUM  Final   Special Requests NONE  Final   Sputum evaluation   Final    THIS SPECIMEN IS ACCEPTABLE FOR SPUTUM CULTURE Performed  at [facility], [address] [place], [place] [address]    Report Status 08/07/2021 FINAL  Final  Culture, Respiratory w Gram Stain     Status: None   Collection Time: 08/07/21  8:32 PM   Specimen: SPU  Result Value Ref Range Status   Specimen Description   Final    SPUTUM Performed at [facility], [address], [place], [place] [address]    Special Requests   Final    NONE Reflexed from [id number] Performed at [facility], [address] [place], [place] [address]    Gram Stain   Final    MODERATE WBC PRESENT,BOTH PMN AND MONONUCLEAR RARE YEAST FEW GRAM POSITIVE COCCI IN PAIRS FEW GRAM VARIABLE ROD    Culture   Final    FEW ACINETOBACTER BAUMANNII MODERATE DIPHTHEROIDS(CORYNEBACTERIUM SPECIES) Standardized susceptibility testing for this organism is not available. Performed at [facility], [place] [address] [place], [place] [address]    Report Status 08/10/2021 FINAL  Final   Organism ID, Bacteria ACINETOBACTER BAUMANNII  Final      Susceptibility   Acinetobacter baumannii - MIC*    CEFTAZIDIME 4 SENSITIVE Sensitive     CIPROFLOXACIN >=4 RESISTANT Resistant     GENTAMICIN 2 SENSITIVE Sensitive     IMIPENEM <=0.25 SENSITIVE Sensitive     PIP/TAZO <=4 SENSITIVE Sensitive     TRIMETH/SULFA <=20 SENSITIVE Sensitive     AMPICILLIN/SULBACTAM <=2 SENSITIVE Sensitive     * FEW ACINETOBACTER BAUMANNII  Body fluid culture w Gram Stain     Status: None   Collection Time: 08/08/21  9:45 AM   Specimen: PATH Cytology Pleural fluid  Result Value Ref Range Status   Specimen Description   Final    PLEURAL Performed at [facility], [address], [place], [place] [address]    Special Requests   Final    PLEURAL Performed at [facility], [address] [place], [place] [address]    Gram Stain   Final    WBC PRESENT, PREDOMINANTLY MONONUCLEAR NO ORGANISMS SEEN CYTOSPIN SMEAR    Culture   Final    NO GROWTH 3  DAYS Performed at [facility], [place] [address], [place], [place] [address]    Report Status 08/11/2021 FINAL  Final  Resp Panel by RT-PCR (Flu A&B, Covid) Nasopharyngeal Swab     Status: None   Collection Time: 08/12/21  6:25 AM   Specimen: Nasopharyngeal Swab; Nasopharyngeal(NP) swabs in vial transport medium  Result Value Ref Range Status   SARS Coronavirus 2 by RT PCR NEGATIVE NEGATIVE Final    Comment: (NOTE) SARS-CoV-2 target nucleic acids are NOT DETECTED.  The SARS-CoV-2 RNA is generally detectable in upper respiratory specimens during the acute phase of infection. The lowest concentration of SARS-CoV-2 viral copies this assay can detect is 138 copies/mL. A negative result does not preclude SARS-Cov-2 infection and should not be used as the sole basis for treatment or other patient management decisions. A negative result may occur with  improper specimen collection/handling, submission of specimen other than nasopharyngeal swab, presence of viral mutation(s) within the areas targeted by this assay, and inadequate number of viral copies(<138 copies/mL). A negative result must be combined with clinical observations, patient history, and epidemiological information.  The expected result is Negative.  Fact Sheet for Patients:  EntrepreneurPulse.com.au  Fact Sheet for Healthcare Providers:  IncredibleEmployment.be  This test is no t yet approved or cleared by the [place] FDA and  has been authorized for detection and/or diagnosis of SARS-CoV-2 by FDA under an Emergency Use Authorization (EUA). This EUA will remain  in effect (meaning this test can be used) for the duration of the COVID-19 declaration under Section 564(b)(1) of the Act, 21 U.S.C.section 360bbb-3(b)(1), unless the authorization is terminated  or revoked sooner.       Influenza A by PCR NEGATIVE NEGATIVE Final   Influenza B by PCR NEGATIVE NEGATIVE Final     Comment: (NOTE) The Xpert Xpress SARS-CoV-2/FLU/RSV plus assay is intended as an aid in the diagnosis of influenza from Nasopharyngeal swab specimens and should not be used as a sole basis for treatment. Nasal washings and aspirates are unacceptable for Xpert Xpress SARS-CoV-2/FLU/RSV testing.  Fact Sheet for Patients: EntrepreneurPulse.com.au  Fact Sheet for Healthcare Providers: IncredibleEmployment.be  This test is not yet approved or cleared by the [place] FDA and has been authorized for detection and/or diagnosis of SARS-CoV-2 by FDA under an Emergency Use Authorization (EUA). This EUA will remain in effect (meaning this test can be used) for the duration of the COVID-19 declaration under Section 564(b)(1) of the Act, 21 U.S.C. section 360bbb-3(b)(1), unless the authorization is terminated or revoked.  Performed at [facility], [address] [place], [place] [address]      Radiological Exams on Admission: DG Chest Portable 1 View  Result Date: 08/12/2021 CLINICAL DATA:  Acute onset short of breath EXAM: PORTABLE CHEST 1 VIEW COMPARISON:  08/08/2021 FINDINGS: Stable cardiac silhouette. Interval increase in bilateral pleural effusions. Mild interstitial edema pattern also increased. No pneumothorax. No focal consolidation. IMPRESSION: Increasing pleural effusions and interstitial edema. Electronically Signed   By: Suzy Bouchard M.D.   On: 08/12/2021 07:26     EKG: I have personally reviewed.  Atrial fibrillation, QTC 493, heart rate 119, poor R wave progression, poor quality of EKG strips  Assessment/Plan Principal Problem:   Acute on chronic respiratory failure with hypoxia (HCC) Active Problems:   Hypertension   Tobacco use   Personal history of lung cancer   Acute on chronic diastolic CHF (congestive heart failure) (HCC)   Alcohol abuse   COPD exacerbation (HCC)   Sepsis ([place])   Atrial fibrillation with RVR  (HCC)   Hyponatremia   Elevated troponin   HLD (hyperlipidemia)   CAD (coronary artery disease)   Hypokalemia   Pleural effusion   Acute on chronic respiratory failure with hypoxia due to combination of COPD and CHF exacerbation: Patient has positive JVD, 2+ leg edema, increased interstitial edema on chest x-ray, crackles on auscultation, consistent with CHF exacerbation.  He has decreased air movement and rhonchi on auscultation, consistent with COPD exacerbation.  -Admitted to progressive bed as inpatient -BiPAP -Bronchodilators and steroid, antibiotics -IV Lasix for CHF -Consult to transition care team for possible SNF placement -Palliative consult  Acute on chronic diastolic CHF (congestive heart failure): 2D echo on 07/09/2021 showed EF of 55 to 60%.  Pending BMP -IV Lasix 40 mg twice daily -Daily weights -strict I/O's -Fluid restriction -Obtain REDs Vest reading  COPD exacerbation: Sputum cx was positive for acinetobacter. Pt was discharged on Augmentin and bactrim.  -will switch to zosyn per pharm discussion -Bronchodilators -Solu-Medrol 40 mg IV bid -Mucinex for cough  -Incentive spirometry - sputum culture -  Nasal cannula oxygen as needed to maintain O2 saturation 93% or greater when pt is off BiPAP  Sepsis due to COPD exacerbation: Patient meets criteria for sepsis with tachycardia with heart rate of 111 and 90 cavity with RR 27.  No leukocytosis or fever.  Pending lactic acid.  Due to CHF exacerbation, will not give IV fluid. -Trend lactic acid level -Check procalcitonin level -On antibiotics as above, Zosyn  Hypertension -IV hydralazine as needed -Amlodipine, lisinopril, metoprolol  Tobacco abuse and Alcohol abuse: -Did counseling about importance of quitting smoking -Nicotine patch -Did counseling about the importance of quitting drinking -CIWA protocol  Personal history of lung cancer -s/p of right lobectomy, 1/3 of right lung  Atrial fibrillation with  RVR ([place]): HT 111 --> 101.  Patient is not taking anticoagulants currently. -Continue home metoprolol 25 mg daily  Hyponatremia: This is chronic issue.  Recent sodium was 126 on 08/11/2021.  Today her sodium is 127. -Sodium tablet 2 g 3 times daily -Check BMP every 8 hours since patient will be on Lasix -Fluid restriction  Elevated troponin and history of CAD: Troponin is minimally elevated, 19, 23, likely demand ischemia -Trend troponin - Lipitor -Patient is allergic to aspirin -Check A1c, FLP  HLD (hyperlipidemia) -Lipitor  Hypokalemia: Potassium 4.4 --> 3.2, magnesium 2.1, phosphorus 2.2 -Repleted potassium  Pleural effusion: -Patient is on IV Lasix.  If not responding to diuretic treatment, may consider thoracentesis again     DVT ppx: SQ Heparin   Code Status: DNR per pt which is confirmed by her nephew Family Communication:   Yes, patient's nephew by phone Disposition Plan:  likely to SNF Consults called:  none Admission status and Level of care: Progressive Cardiac:     as inpt       Status is: Inpatient  Remains inpatient appropriate because: Patient has multiple comorbidities, now presents with severe respiratory distress, requiring BiPAP.  This is likely due to combination of COPD exacerbation and CHF exacerbation.  Patient also has hyponatremia, elevated troponin, A. fib with RVR.  Her presentation is highly complicated.  Patient is at high risk of deteriorating.  Will need to be treated in hospital for at least 2 days         Date of Service 08/12/2021    [facility]   If 7PM-7AM, please contact night-coverage [email or web address] 08/12/2021, 11:06 AM

## 2021-08-12 NOTE — TOC Initial Note (Addendum)
Transition of Care [facility]) - Initial/Assessment Note    Patient Details  Name: Felicia Sellers MRN: [id number] Date of Birth: [birth year]  Transition of Care [facility]) CM/SW Contact:    Ova Freshwater Phone Number: [phone] 08/12/2021, 2:53 PM  Clinical Narrative:                  Patient presents to [facility] ED due to SOB.  Patient was discharged from [facility] on 08/11/2021.  Patient insisted on returning home without home health.  EMS stated the patient was on 4L at home.  CSW spoke with patient and she sated she is willing to go to SNF for rehab but will not go back to [facility]. CSW stated I would not include [facility] in the new search for skilled placement. Placement verbalized understanding.  Patient stated she wants to return home. CSW update patient's nephew [facility]) [phone].  Patient has Medicare and Medicaid.     PASRR Number: [id number]  Patient Goals and CMS Choice        Expected Discharge Plan and Services                                                Prior Living Arrangements/Services                       Activities of Daily Living      Permission Sought/Granted                  Emotional Assessment              Admission diagnosis:  COPD exacerbation ([place]) [J44.1] Patient Active Problem List   Diagnosis Date Noted   COPD exacerbation ([place]) 08/12/2021   Sepsis ([place]) 08/12/2021   Atrial fibrillation with RVR ([place]) 08/12/2021   Hyponatremia 08/12/2021   Elevated troponin 08/12/2021   HLD (hyperlipidemia) 08/12/2021   CAD (coronary artery disease) 08/12/2021   Hypokalemia 08/12/2021   Pleural effusion 08/12/2021   Protein-calorie malnutrition, severe 08/09/2021   Acute on chronic diastolic CHF (congestive heart failure) ([place]) 08/08/2021   Tobacco dependence 08/08/2021   Alcohol abuse 08/08/2021   Aortic atherosclerosis ([place]) 08/08/2021   Emphysema ([place]) 08/08/2021   Unspecified atrial  fibrillation ([place]) 08/08/2021   Lung mass 08/08/2021   Aortic aneurysm ([place]) 08/08/2021   Ovarian mass 08/08/2021   Acute respiratory failure with hypoxia ([place]) 08/08/2021   COPD with acute exacerbation ([place]) 08/07/2021   Pleural effusion on left 08/07/2021   Acute on chronic respiratory failure with hypoxia ([place]) 07/09/2021   Coronary artery disease involving native coronary artery of native heart without angina pectoris 07/19/2020   COPD, moderate ([place]) 07/19/2020   Personal history of lung cancer 07/19/2020   Leukocytosis 06/26/2020   Thoracic aortic aneurysm without rupture 08/16/2018   Kyphosis 08/07/2018   Hypertension 08/07/2018   Tobacco use 08/07/2018   Age-related osteoporosis without current pathological fracture 12/21/2016   Chronic hyponatremia 11/04/2015   Chronic low back pain 06/18/2015   Cardiomyopathy ([place]) 08/13/2014   Left bundle branch block (LBBB) 07/23/2014   Pedal edema 07/23/2014   SOB (shortness of breath) on exertion 07/23/2014   PCP:  [facility]:   [facility] [place], [place] - [address] AT [place] [address] [facility] [place] [id number] Phone: [phone] Fax: [phone]     Social Determinants of Health (SDOH) Interventions    Readmission Risk Interventions No flowsheet data found.

## 2021-08-12 NOTE — ED Provider Notes (Signed)
[facility]  ____________________________________________   Event Date/Time   First MD Initiated Contact with Patient 08/12/21 0602     (approximate)  I have reviewed the triage vital signs and the nursing notes.   HISTORY  Chief Complaint Respiratory Distress (Pt in via EMS from pt's home for SOB. EMS report pt's SPO2 at home on 4l 91%. EMS report pt's family states they can't take care of her anymore. EMS place pt on 6l n/c 96%.)    HPI Felicia Sellers is a 80 y.o. female past medical history of   lung cancer s/p lobectomy; chronic diastolic CHF; COPD; CAD; and HTN who presents with respiratory distress.  History is limited as the patient is in significant distress.  Tells me that she felt better after leaving the hospital for about 4 hours and then developed worsening shortness of breath.  Of note patient was recently admitted on 10 16-10 20 for respiratory failure secondary to COPD.  She was ultimately discharged on 4 L nasal cannula.  Per EMS, patient's nephew who is her caretaker does not feel that she can come back because he cannot care for her.      Past Medical History:  Diagnosis Date   Aortic aneurysm ([place]) 08/08/2021   Aortic atherosclerosis ([place]) 08/08/2021   Cancer ([place])    right lung removed - lung cancer 1/3 lung removed    CHF (congestive heart failure) (HCC)    Chronic diastolic CHF (congestive heart failure) ([place]) 08/08/2021   Chronic hyponatremia 11/04/2015   Overview:  Normal serum oslmolality   COPD (chronic obstructive pulmonary disease) (HCC)    Coronary artery disease    Emphysema ([place]) 08/08/2021   History of stomach ulcers    Hypertension    Lung mass 08/08/2021   Ovarian mass 08/08/2021   Pneumonia    Unspecified atrial fibrillation ([place]) 08/08/2021    Patient Active Problem List   Diagnosis Date Noted   Protein-calorie malnutrition, severe 08/09/2021   Chronic diastolic CHF (congestive heart failure) ([place]) 08/08/2021    Tobacco dependence 08/08/2021   Alcohol abuse 08/08/2021   Aortic atherosclerosis ([place]) 08/08/2021   Emphysema ([place]) 08/08/2021   Unspecified atrial fibrillation ([place]) 08/08/2021   Lung mass 08/08/2021   Aortic aneurysm ([place]) 08/08/2021   Ovarian mass 08/08/2021   Acute respiratory failure with hypoxia ([place]) 08/08/2021   COPD with acute exacerbation ([place]) 08/07/2021   Pleural effusion on left 08/07/2021   Acute on chronic respiratory failure with hypoxia ([place]) 07/09/2021   Coronary artery disease involving native coronary artery of native heart without angina pectoris 07/19/2020   COPD, moderate ([place]) 07/19/2020   Personal history of lung cancer 07/19/2020   Leukocytosis 06/26/2020   Thoracic aortic aneurysm without rupture 08/16/2018   Kyphosis 08/07/2018   Hypertension 08/07/2018   Tobacco use 08/07/2018   Age-related osteoporosis without current pathological fracture 12/21/2016   Chronic hyponatremia 11/04/2015   Chronic low back pain 06/18/2015   Cardiomyopathy ([place]) 08/13/2014   Left bundle branch block (LBBB) 07/23/2014   Pedal edema 07/23/2014   SOB (shortness of breath) on exertion 07/23/2014    Past Surgical History:  Procedure Laterality Date   LUNG REMOVAL, PARTIAL Left     Prior to Admission medications   Medication Sig Start Date End Date Taking? Authorizing Provider  Albuterol Sulfate (PROAIR RESPICLICK) 856 (90 Base) MCG/ACT AEPB Inhale 2 puffs into the lungs every 6 (six) hours as needed. 06/28/20   Danford, Suann Larry, MD  amLODipine (NORVASC) 10  MG tablet Take 10 mg by mouth daily.    [provider]  amoxicillin-clavulanate (AUGMENTIN) 500-125 MG tablet Take 1 tablet (500 mg total) by mouth every 8 (eight) hours for 2 days. 08/11/21 08/13/21  Wyvonnia Dusky, MD  atorvastatin (LIPITOR) 40 MG tablet Take 40 mg by mouth at bedtime.    [provider]  calcium carbonate (OS-CAL - DOSED IN MG OF ELEMENTAL CALCIUM) 1250 (500 Ca) MG  tablet Take 1 tablet by mouth 2 (two) times daily with a meal.     [provider]  Cholecalciferol 1000 units capsule Take 1,000 Units by mouth daily.    [provider]  diclofenac Sodium (VOLTAREN) 1 % GEL Apply topically 2 (two) times daily. Apply to upper back and shoulders every morning and at bedtime related to muscle weakness.    [provider]  docusate sodium (COLACE) 100 MG capsule Take 100 mg by mouth daily.    [provider]  gabapentin (NEURONTIN) 300 MG capsule Take 300 mg by mouth 3 (three) times daily.    [provider]  ipratropium (ATROVENT) 0.02 % nebulizer solution Take 0.5 mg by nebulization every 12 (twelve) hours as needed for wheezing or shortness of breath.    [provider]  lisinopril (ZESTRIL) 10 MG tablet Take 10 mg by mouth daily.     [provider]  loperamide (IMODIUM) 2 MG capsule Take 4 mg by mouth as needed for diarrhea or loose stools.    [provider]  metoprolol succinate (TOPROL-XL) 25 MG 24 hr tablet Take 25 mg by mouth daily.    [provider]  predniSONE (DELTASONE) 20 MG tablet Take 2 tablets (40 mg total) by mouth daily for 5 days. 08/11/21 08/16/21  Wyvonnia Dusky, MD  sodium chloride 1 g tablet Take 2 g by mouth 3 (three) times daily.    [provider]  sulfamethoxazole-trimethoprim (BACTRIM DS) 800-160 MG tablet Take 1 tablet by mouth every 12 (twelve) hours for 4 days. 08/11/21 08/15/21  Wyvonnia Dusky, MD    Allergies Aspirin  Family History  Problem Relation Age of Onset   Breast cancer Neg Hx     Social History Social History   Tobacco Use   Smoking status: Former    Packs/day: 1.00    Years: 66.00    Pack years: 66.00    Types: Cigarettes    Quit date: 06/26/2020    Years since quitting: 1.1   Smokeless tobacco: Never  Vaping Use   Vaping Use: Never used  Substance Use Topics   Alcohol use: Not Currently    Comment: use  to drink alot ( vodka) 6 months ago   Drug use: Never    Review of Systems   Review of Systems  Unable to perform ROS: Acuity of condition   Physical Exam Updated Vital Signs BP (!) 166/114 Comment: Bi-pap  Pulse (!) 101 Comment: Bi-pap  Temp 97.9 F (36.6 C) (Axillary)   Resp (!) 27 Comment: Bi-pap  Ht 5\' 3"  (1.6 m)   Wt 51.6 kg   SpO2 97% Comment: Bi-pap  BMI 20.15 kg/m   Physical Exam Vitals and nursing note reviewed.  Constitutional:      General: She is in acute distress.     Appearance: Normal appearance. She is ill-appearing.     Comments: Patient is chronically ill-appearing and in acute distress, significant increased work of breathing  HENT:     Head: Normocephalic and atraumatic.  Eyes:     General: No scleral icterus.    Conjunctiva/sclera: Conjunctivae normal.  Pulmonary:     Effort: Respiratory distress present.     Breath sounds: No stridor.     Comments: Patient is in respiratory distress, using assessor muscles, speaking in short sentences, significant rattling and wheezing Abdominal:     General: Abdomen is flat. There is no distension.     Palpations: Abdomen is soft.     Tenderness: There is no abdominal tenderness. There is no guarding.  Musculoskeletal:        General: No deformity or signs of injury.     Cervical back: Normal range of motion.     Right lower leg: Edema present.     Left lower leg: Edema present.     Comments: Plus lower extremity edema bilaterally  Skin:    General: Skin is dry.     Coloration: Skin is not jaundiced or pale.  Neurological:     General: No focal deficit present.     Mental Status: She is alert and oriented to person, place, and time. Mental status is at baseline.  Psychiatric:        Mood and Affect: Mood normal.        Behavior: Behavior normal.     LABS (all labs ordered are listed, but only abnormal results are displayed)  Labs Reviewed  COMPREHENSIVE METABOLIC PANEL - Abnormal; Notable for the  following components:      Result Value   Sodium 127 (*)    Chloride 85 (*)    Glucose, Bld 119 (*)    Total Protein 8.2 (*)    Alkaline Phosphatase 159 (*)    All other components within normal limits  TROPONIN I (HIGH SENSITIVITY) - Abnormal; Notable for the following components:   Troponin I (High Sensitivity) 19 (*)    All other components within normal limits  RESP PANEL BY RT-PCR (FLU A&B, COVID) ARPGX2  CBC WITH DIFFERENTIAL/PLATELET  BRAIN NATRIURETIC PEPTIDE   ____________________________________________  EKG  Atrial fibrillation with rapid ventricular rate, right axis deviation, no acute ischemic changes ____________________________________________  RADIOLOGY Almeta Monas, personally viewed and evaluated these images (plain radiographs) as part of my medical decision making, as well as reviewing the written report by the radiologist.  ED MD interpretation: I reviewed the chest x-ray which shows patchy opacities bilaterally as well as bilateral pleural effusions    ____________________________________________   PROCEDURES  Procedure(s) performed (including Critical Care):  .Critical Care Performed by: Rada Hay, MD Authorized by: Rada Hay, MD   Critical care provider statement:    Critical care time (minutes):  30   Critical care was necessary to treat or prevent imminent or life-threatening deterioration of the following conditions:  Respiratory failure   Critical care was time spent personally by me on the following activities:  Development of treatment plan with patient or surrogate, evaluation of patient's response to treatment, ordering and performing treatments and interventions, ordering and review of laboratory studies, ordering and review of radiographic studies, review of old charts and pulse oximetry   I assumed direction of critical care for this patient from another provider in my specialty: no     Care discussed with:  admitting provider     ____________________________________________   INITIAL IMPRESSION / [place] / ED COURSE   Patient is an 80 year old female presenting with respiratory distress after recent admission for COPD and acute on chronic respiratory failure.  On arrival she is in significant distress, barely able to speak in short sentences significant accessory muscle use and audible chest rattling.  Patient was placed on BiPAP with some improvement in her work of breathing.  She is notably DNR DNI which I confirmed with her and is still the case at this time.  Will initiate nebs labs and chest x-ray but patient will require admission.  Ultimately she should not be discharged home as she has quickly proven that that is not going to work for her.  Would really benefit from palliative care at this point.  After being placed on BiPAP patient looking significantly improved.  Saturations normal and her heart rate significantly improved to the 100s.  Chest x-ray shows patchy opacities bilaterally as well as bilateral pleural effusions, together with her lower extremity edema I question whether she has volume overload on top of her COPD.  It is elevated at 19, suspect that this is in the setting of demand.  Patient was supposed to be getting amoxicillin and Bactrim.  Will defer antibiotic management to the inpatient team.  Clinical Course as of 08/12/21 0710  Fri Aug 12, 2021  0708 Troponin I (High Sensitivity)(!): 19 [KM]    Clinical Course User Index [KM] Rada Hay, MD     ____________________________________________   FINAL CLINICAL IMPRESSION(S) / ED DIAGNOSES  Final diagnoses:  Acute on chronic respiratory failure with hypoxia [facility])     ED Discharge Orders     None        Note:  This document was prepared using Dragon voice recognition software and may include unintentional dictation errors.    Rada Hay, MD 08/12/21 [phone]

## 2021-08-13 DIAGNOSIS — F1010 Alcohol abuse, uncomplicated: Secondary | ICD-10-CM | POA: Diagnosis not present

## 2021-08-13 DIAGNOSIS — I2510 Atherosclerotic heart disease of native coronary artery without angina pectoris: Secondary | ICD-10-CM

## 2021-08-13 DIAGNOSIS — I4891 Unspecified atrial fibrillation: Secondary | ICD-10-CM | POA: Diagnosis not present

## 2021-08-13 DIAGNOSIS — I5033 Acute on chronic diastolic (congestive) heart failure: Secondary | ICD-10-CM | POA: Diagnosis not present

## 2021-08-13 DIAGNOSIS — R5381 Other malaise: Secondary | ICD-10-CM

## 2021-08-13 DIAGNOSIS — J90 Pleural effusion, not elsewhere classified: Secondary | ICD-10-CM

## 2021-08-13 DIAGNOSIS — E876 Hypokalemia: Secondary | ICD-10-CM

## 2021-08-13 DIAGNOSIS — G9341 Metabolic encephalopathy: Secondary | ICD-10-CM

## 2021-08-13 DIAGNOSIS — Z7189 Other specified counseling: Secondary | ICD-10-CM

## 2021-08-13 DIAGNOSIS — J9621 Acute and chronic respiratory failure with hypoxia: Secondary | ICD-10-CM | POA: Diagnosis not present

## 2021-08-13 LAB — BASIC METABOLIC PANEL
Anion gap: 9 (ref 5–15)
Anion gap: 12 (ref 5–15)
BUN: 15 mg/dL (ref 8–23)
BUN: 17 mg/dL (ref 8–23)
CO2: 35 mmol/L — ABNORMAL HIGH (ref 22–32)
CO2: 34 mmol/L — ABNORMAL HIGH (ref 22–32)
Calcium: 8.3 mg/dL — ABNORMAL LOW (ref 8.9–10.3)
Calcium: 8.5 mg/dL — ABNORMAL LOW (ref 8.9–10.3)
Chloride: 86 mmol/L — ABNORMAL LOW (ref 98–111)
Chloride: 85 mmol/L — ABNORMAL LOW (ref 98–111)
Creatinine, Ser: 0.62 mg/dL (ref 0.44–1.00)
Creatinine, Ser: 0.75 mg/dL (ref 0.44–1.00)
GFR, Estimated: >60 mL/min (ref >60)
GFR, Estimated: >60 mL/min (ref >60)
Glucose, Bld: 93 mg/dL (ref 70–99)
Glucose, Bld: 98 mg/dL (ref 70–99)
Potassium: 3.3 mmol/L — ABNORMAL LOW (ref 3.5–5.1)
Potassium: 3.7 mmol/L (ref 3.5–5.1)
Sodium: 130 mmol/L — ABNORMAL LOW (ref 135–145)
Sodium: 131 mmol/L — ABNORMAL LOW (ref 135–145)

## 2021-08-13 LAB — LIPID PANEL
Cholesterol: 128 mg/dL (ref 0–200)
HDL: 69 mg/dL (ref >40)
LDL Cholesterol: 48 mg/dL (ref 0–99)
Total CHOL/HDL Ratio: 1.9 RATIO
Triglycerides: 56 mg/dL (ref <150)
VLDL: 11 mg/dL (ref 0–40)

## 2021-08-13 LAB — MAGNESIUM: Magnesium: 1.9 mg/dL (ref 1.7–2.4)

## 2021-08-13 MED ORDER — METOPROLOL TARTRATE 25 MG PO TABS
25.0000 mg | ORAL_TABLET | Freq: Two times a day (BID) | ORAL | Status: DC
  Administered 2021-08-13 – 2021-08-15 (×3): 25 mg via ORAL
  Filled 2021-08-13 (×3): qty 1

## 2021-08-13 MED ORDER — POTASSIUM CHLORIDE 10 MEQ/100ML IV SOLN
10.0000 meq | INTRAVENOUS | Status: AC
  Administered 2021-08-13 (×4): 10 meq via INTRAVENOUS
  Filled 2021-08-13: qty 100

## 2021-08-13 MED ORDER — DILTIAZEM HCL 25 MG/5ML IV SOLN
10.0000 mg | Freq: Once | INTRAVENOUS | Status: AC
  Administered 2021-08-13: 10 mg via INTRAVENOUS
  Filled 2021-08-13: qty 5

## 2021-08-13 MED ORDER — GABAPENTIN 300 MG PO CAPS
300.0000 mg | ORAL_CAPSULE | Freq: Three times a day (TID) | ORAL | Status: DC
  Administered 2021-08-13 – 2021-08-15 (×2): 300 mg via ORAL
  Filled 2021-08-13 (×3): qty 1

## 2021-08-13 MED ORDER — AMOXICILLIN-POT CLAVULANATE 500-125 MG PO TABS: 1.0000 | ORAL_TABLET | Freq: Three times a day (TID) | ORAL | Status: DC

## 2021-08-13 MED ORDER — METRONIDAZOLE 500 MG/100ML IV SOLN: 500.0000 mg | Freq: Two times a day (BID) | INTRAVENOUS | Status: DC

## 2021-08-13 MED ORDER — VITAMIN D 25 MCG (1000 UNIT) PO TABS
1000.0000 [IU] | ORAL_TABLET | Freq: Every day | ORAL | Status: DC
  Administered 2021-08-14 – 2021-08-20 (×6): 1000 [IU] via ORAL
  Filled 2021-08-13 (×7): qty 1

## 2021-08-13 MED ORDER — DICLOFENAC SODIUM 1 % EX GEL
2.0000 g | Freq: Two times a day (BID) | CUTANEOUS | Status: DC | PRN
  Filled 2021-08-13: qty 100

## 2021-08-13 MED ORDER — SODIUM CHLORIDE 1 G PO TABS
2.0000 g | ORAL_TABLET | Freq: Three times a day (TID) | ORAL | Status: DC
  Administered 2021-08-13 – 2021-08-14 (×2): 2 g via ORAL
  Filled 2021-08-13 (×6): qty 2

## 2021-08-13 MED ORDER — SODIUM CHLORIDE 0.9 % IV SOLN
2.0000 g | Freq: Two times a day (BID) | INTRAVENOUS | Status: AC
  Administered 2021-08-13 – 2021-08-19 (×14): 2 g via INTRAVENOUS
  Filled 2021-08-13 (×14): qty 2

## 2021-08-13 MED ORDER — CALCIUM CARBONATE 1250 (500 CA) MG PO TABS
1.0000 | ORAL_TABLET | Freq: Two times a day (BID) | ORAL | Status: DC
  Administered 2021-08-14 – 2021-08-20 (×12): 500 mg via ORAL
  Filled 2021-08-13 (×14): qty 1

## 2021-08-13 MED ORDER — DOCUSATE SODIUM 100 MG PO CAPS
100.0000 mg | ORAL_CAPSULE | Freq: Every day | ORAL | Status: DC
  Administered 2021-08-14 – 2021-08-17 (×4): 100 mg via ORAL
  Filled 2021-08-13 (×6): qty 1

## 2021-08-13 MED ORDER — SULFAMETHOXAZOLE-TRIMETHOPRIM 800-160 MG PO TABS
1.0000 | ORAL_TABLET | Freq: Two times a day (BID) | ORAL | Status: DC
  Filled 2021-08-13: qty 1

## 2021-08-13 MED ORDER — METOPROLOL SUCCINATE ER 50 MG PO TB24
25.0000 mg | ORAL_TABLET | Freq: Every day | ORAL | Status: DC
  Administered 2021-08-13: 25 mg via ORAL
  Filled 2021-08-13: qty 1

## 2021-08-13 MED ORDER — ATORVASTATIN CALCIUM 20 MG PO TABS
40.0000 mg | ORAL_TABLET | Freq: Every day | ORAL | Status: DC
  Administered 2021-08-13 – 2021-08-19 (×6): 40 mg via ORAL
  Filled 2021-08-13 (×6): qty 2

## 2021-08-13 MED ORDER — LISINOPRIL 10 MG PO TABS
10.0000 mg | ORAL_TABLET | Freq: Every day | ORAL | Status: DC
  Filled 2021-08-13: qty 1

## 2021-08-13 MED ORDER — HEPARIN SODIUM (PORCINE) 5000 UNIT/ML IJ SOLN
5000.0000 [IU] | Freq: Three times a day (TID) | INTRAMUSCULAR | Status: DC
  Administered 2021-08-13 – 2021-08-20 (×21): 5000 [IU] via SUBCUTANEOUS
  Filled 2021-08-13 (×21): qty 1

## 2021-08-13 MED ORDER — LOPERAMIDE HCL 2 MG PO CAPS: 4.0000 mg | ORAL_CAPSULE | ORAL | Status: DC | PRN

## 2021-08-13 MED ORDER — POTASSIUM CHLORIDE CRYS ER 20 MEQ PO TBCR
40.0000 meq | EXTENDED_RELEASE_TABLET | ORAL | Status: DC
  Filled 2021-08-13: qty 2

## 2021-08-13 MED ORDER — AMLODIPINE BESYLATE 5 MG PO TABS
10.0000 mg | ORAL_TABLET | Freq: Every day | ORAL | Status: DC
  Filled 2021-08-13: qty 2

## 2021-08-13 MED ORDER — SODIUM CHLORIDE 0.9 % IV SOLN
500.0000 mg | INTRAVENOUS | Status: AC
  Administered 2021-08-13 – 2021-08-15 (×3): 500 mg via INTRAVENOUS
  Filled 2021-08-13 (×3): qty 500

## 2021-08-13 NOTE — ED Notes (Signed)
Pt took off monitor and now picking on her IV. Pt now upset asking why she's here and insisting on removing IV, heart monitor, O2. Reoriented pt but still continues to remove gown and monitor.

## 2021-08-13 NOTE — ED Notes (Signed)
Pt laying in bed at this time, pt returned bottom dentures to her mouth, Pt states she needs onions. Pt laying in bed, no distress at this time.

## 2021-08-13 NOTE — Consult Note (Signed)
Pharmacy Antibiotic Note  Felicia Sellers is a 80 y.o. female with PMH of COPD, Afib, CAD, dCHF, lung cancer, HTN, HLD who was admitted on 08/12/2021 with pneumonia.  Pharmacy has been consulted for cefepime dosing.  Afeb, WBC 17.7, procal <0.1  Bcx and sputum culture sent  Plan: Cefepime -Will start cefepime 2g IV q12h  Will continue to monitor renal function and adjust dose as clinically indicated   Height: 5\' 3"  (160 cm) Weight: 51.6 kg (113 lb 12.1 oz) IBW/kg (Calculated) : 52.4  Temp (24hrs), Avg:98 F (36.7 C), Min:97.7 F (36.5 C), Max:98.3 F (36.8 C)  Recent Labs  Lab 08/07/21 0435 08/07/21 0436 08/08/21 0651 08/11/21 0405 08/12/21 0625 08/12/21 0840 08/12/21 0911 08/12/21 1033 08/12/21 1753 08/13/21 0617  WBC 7.4  --  5.4 10.2  --  17.7*  --   --   --   --   CREATININE 0.67  --  0.75 0.61 0.60  --  0.63  --  0.67 0.62  LATICACIDVEN  --  1.4  --   --   --   --   --  2.3*  --   --     Estimated Creatinine Clearance: 44.2 mL/min (by C-G formula based on SCr of 0.62 mg/dL).    Allergies  Allergen Reactions   Aspirin Other (See Comments)    High risk of bleeding    Antimicrobials this admission: Ongoing Cefepime 10/22 >>   Complete  Zosyn 10/21 >> 10/22  Microbiology results: 10/222 BCx: sent 10/22 Sputum: sent    Thank you for allowing pharmacy to be a part of this patient's care.  Narda Rutherford, PharmD Pharmacy Resident  08/13/2021 1:21 PM

## 2021-08-13 NOTE — ED Notes (Addendum)
MD at the bedside, informed MD of pt's heart rhythm going from Afib- multiple PVCs to [facility] on the monitor. Per MD, it can be related to pt's COPD/CHF.

## 2021-08-13 NOTE — Progress Notes (Signed)
PROGRESS NOTE  Felicia Sellers VVO:[id number] DOB: 22-Jun-1941   PCP: [facility]  Patient is from: Home.  Reportedly independent for most ADLs at baseline.  DOA: 08/12/2021 LOS: 1  Chief complaints:  Chief Complaint  Patient presents with   Respiratory Distress    Pt in via EMS from pt's home for SOB. EMS report pt's SPO2 at home on 4l 91%. EMS report pt's family states they can't take care of her anymore. EMS place pt on 6l n/c 96%.     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 fib not on AC, stomach ulcer, CAD, diastolic CHF, lung cancer s/p right lobectomy, tobacco use disorder, alcohol abuse, ovarian mass, chronic hyponatremia, severe malnutrition, HTN, HLD and recurrent hospitalization with respiratory failure returning with shortness of breath and admitted with acute on chronic RF with hypoxia due to COPD and CHF exacerbation.  Initially required BiPAP.  Started on IV Zosyn, IV Lasix, steroid and breathing treatments.  Subjective: Seen and examined earlier this morning.  No major events overnight of this morning.  Patient is sleepy but wakes to voice.  She is only oriented to self.  Follows some commands. Not able to provide much history.   Objective: Vitals:   08/13/21 1156 08/13/21 1200 08/13/21 1233 08/13/21 1254  BP:  (!) 123/92    Pulse: (!) 124 (!) 120 98   Resp:  (!) 21 (!) 25 19  Temp:      TempSrc:      SpO2:  99% 98%   Weight:      Height:        Intake/Output Summary (Last 24 hours) at 08/13/2021 1308 Last data filed at 08/13/2021 1156 Gross per 24 hour  Intake 114.49 ml  Output 800 ml  Net -685.51 ml   Filed Weights   08/12/21 0641  Weight: 51.6 kg    Examination:  GENERAL: Frail looking elderly female.  No apparent distress. HEENT: MMM.  Vision and hearing grossly intact.  NECK: Supple.  No apparent JVD.  RESP:  No IWOB.  Fair aeration bilaterally. CVS: IR in 120s.  Heart sounds normal.   ABD/GI/GU: BS+. Abd soft, NTND.  MSK/EXT:  Moves extremities.  RUE and RLE dependent edema SKIN: no apparent skin lesion or wound NEURO: Sleepy but wakes to voice.  Oriented only to self.  Follows commands.  No apparent focal neuro deficit but limited exam due to mental status. PSYCH: Calm. Normal affect.     Procedures:  None  Microbiology summarized: [id number] and influenza PCR nonreactive. Sputum culture pending. Blood culture pending.  Assessment & Plan: Acute on chronic respiratory failure with hypoxia due to combination of COPD, CHF, A. fib, pleural effusion and underlying lung cancer.  Third hospitalization for the same issue in less than 5 weeks.  She was discharged on 10/16 on p.o. Augmentin, Bactrim and 4 L by [place].  Currently saturating at 97% on 3 L by [place] at rest. -Treat treatable causes as below. -Wean oxygen as able-minimum oxygen to keep saturation above 90% -Follow palliative medicine recommendation   Acute on chronic diastolic CHF (congestive heart failure): TTE in 06/2021 with LVEF of 55 to 60% (from 70 to 75% in 2021) and moderate TVR. 07/09/2021 showed EF of 55 to 60%.  She has edema, JVD, rhonchi and rales on exam.  CXR with pulmonary edema and increased pleural effusion.  BNP elevated to 770 (higher than prior values).  Started on  IV Lasix.  I&O incomplete.  Renal function stable. -Continue IV Lasix 40 mg twice daily -Discontinue amlodipine and lisinopril to allow room for diuresis. -Monitor fluid status, renal functions and electrolytes. -Sodium and fluid restrictions -Obtain REDs Vest reading   COPD exacerbation: Sputum culture grew Acinetobacter and moderate diphtheroid prior hospitalization.  She was discharged on Augmentin and Bactrim.  Somewhat rhonchorous on exam.  Has leukocytosis but Pro-Cal negative.  Ongoing tobacco use.  -Discontinue Augmentin and Bactrim -Change Zosyn to IV cefepime and azithromycin -Continue IV Solu-Medrol and  bronchodilators -Continue mucolytic's/antitussive/IS -OOB/PT/OT -Titrate oxygen for saturation above 90% -Follow sputum and blood cultures  Persistent A. fib with RVR: HR ranges from 100s to 120s.  Not on anticoagulation likely due to history of stomach ulcer.  On Toprol-XL at home. -IV Cardizem push 10 mg x 1 -Metoprolol 25 mg twice daily starting this evening -Discontinue as needed hydralazine  Acute metabolic encephalopathy: Somewhat sleepy this morning but wakes to voice.  She is only oriented to self.  Follows command.  No apparent focal neurodeficit.  Has hyponatremia but not severe enough to cause this.  She had 2 mg Ativan last night per CIWA protocol. -Basic encephalopathy labs -Reorientation delirium precautions. -Consider VBG if no improvement  Dysphagia: Likely in the setting of encephalopathy. -SLP eval   Sepsis ruled out.    Essential hypertension: Normotensive. -IV Lasix and metoprolol as above -Discontinue amlodipine and lisinopril   Tobacco use disorder: Reportedly smokes about a pack a day. -Cessation counseling when able to comprehend. -Continue nicotine patch  Alcohol abuse-Per recent discharge summary, sober over the last 1 month. -Continue CIWA, multivitamin, folic acid and thiamine   Personal history of lung cancer -s/p of right lobectomy, 1/3 of right lung   Chronic hyponatremia: Improved.  Could be due to CHF and Bactrim. -Discontinue sodium tablets -Continue IV Lasix -Fluid and sodium restriction   Elevated troponin and history of CAD: Troponin is minimally elevated, 19, 23, likely demand ischemia -Cardiac meds as above -Continue home statin.  Hypokalemia: K3.3. -IV KCl 10 mill equivalent x4 until able to take p.o. safely -Check Mg.  Physical deconditioning -PT/OT eval   Goal of care counseling-appropriately DNR/DNI.  A lot of comorbidity.  Poor long-term prognosis. -Palliative medicine consulted on admission    Severe malnutrition: As  evidenced by low BMI and significant muscle mass and subcu fat loss. Body mass index is 20.15 kg/m.         DVT prophylaxis:  heparin injection 5,000 Units Start: 08/13/21 1400  Code Status: DNR/DNI Family Communication: Patient and/or RN. Available if any question.  Level of care: Progressive Cardiac Status is: Inpatient  Remains inpatient appropriate because: Ongoing encephalopathy, fluid overload, respiratory failure, electrolyte arrangement and need for IV steroid and IV diuretics    Consultants:  None   Sch Meds:  Scheduled Meds:  atorvastatin  40 mg Oral QHS   calcium carbonate  1 tablet Oral BID WC   cholecalciferol  1,000 Units Oral Daily   docusate sodium  100 mg Oral Daily   folic acid  1 mg Oral Daily   furosemide  40 mg Intravenous Q12H   gabapentin  300 mg Oral TID   heparin  5,000 Units Subcutaneous Q8H   ipratropium-albuterol  3 mL Nebulization Q4H   LORazepam  0-4 mg Intravenous Q6H   Followed by   [START ON 08/14/2021] LORazepam  0-4 mg Intravenous Q12H   methylPREDNISolone (SOLU-MEDROL) injection  40 mg Intravenous Q12H   metoprolol  tartrate  25 mg Oral BID   multivitamin with minerals  1 tablet Oral Daily   nicotine  21 mg Transdermal Daily   sodium chloride  2 g Oral TID   thiamine  100 mg Oral Daily   Or   thiamine  100 mg Intravenous Daily   Continuous Infusions:  sodium chloride 10 mL/hr at 08/12/21 2131   azithromycin     potassium chloride     PRN Meds:.sodium chloride, acetaminophen, albuterol, dextromethorphan-guaiFENesin, diclofenac Sodium, loperamide, LORazepam **OR** LORazepam, ondansetron (ZOFRAN) IV  Antimicrobials: Anti-infectives (From admission, onward)    Start     Dose/Rate Route Frequency Ordered Stop   08/13/21 1400  amoxicillin-clavulanate (AUGMENTIN) 500-125 MG per tablet 500 mg  Status:  Discontinued        1 tablet Oral Every 8 hours 08/13/21 1110 08/13/21 1233   08/13/21 1300  metroNIDAZOLE (FLAGYL) IVPB 500 mg   Status:  Discontinued        500 mg 100 mL/hr over 60 Minutes Intravenous Every 12 hours 08/13/21 1254 08/13/21 1254   08/13/21 1300  azithromycin (ZITHROMAX) 500 mg in sodium chloride 0.9 % 250 mL IVPB        500 mg 250 mL/hr over 60 Minutes Intravenous Every 24 hours 08/13/21 1254 08/16/21 1259   08/13/21 1115  sulfamethoxazole-trimethoprim (BACTRIM DS) 800-160 MG per tablet 1 tablet  Status:  Discontinued        1 tablet Oral Every 12 hours 08/13/21 1110 08/13/21 1253   08/12/21 1430  piperacillin-tazobactam (ZOSYN) IVPB 3.375 g  Status:  Discontinued        3.375 g 12.5 mL/hr over 240 Minutes Intravenous Every 8 hours 08/12/21 1406 08/13/21 1254        I have personally reviewed the following labs and images: CBC: Recent Labs  Lab 08/07/21 0435 08/08/21 0651 08/11/21 0405 08/12/21 0840  WBC 7.4 5.4 10.2 17.7*  NEUTROABS 5.1  --   --   --   HGB 11.5* 12.3 12.8 13.6  HCT 33.7* 34.6* 37.9 39.5  MCV 96.0 93.5 93.6 95.4  PLT 255 261 268 286   BMP &GFR Recent Labs  Lab 08/11/21 0405 08/12/21 0625 08/12/21 0642 08/12/21 0911 08/12/21 1753 08/13/21 0617  NA 126* 127*  --  126* 128* 130*  K 4.1 4.4  --  3.4* 3.7 3.3*  CL 89* 85*  --  85* 86* 86*  CO2 30 31  --  31 29 35*  GLUCOSE 83 119*  --  101* 121* 93  BUN 20 16  --  15 14 15   CREATININE 0.61 0.60  --  0.63 0.67 0.62  CALCIUM 8.4* 9.3  --  8.6* 8.4* 8.3*  MG  --   --  2.1  --   --   --   PHOS  --   --  2.2*  --   --   --    Estimated Creatinine Clearance: 44.2 mL/min (by C-G formula based on SCr of 0.62 mg/dL). Liver & Pancreas: Recent Labs  Lab 08/12/21 0625  AST 23  ALT 40  ALKPHOS 159*  BILITOT 1.1  PROT 8.2*  ALBUMIN 4.7   No results for input(s): LIPASE, AMYLASE in the last 168 hours. No results for input(s): AMMONIA in the last 168 hours. Diabetic: No results for input(s): HGBA1C in the last 72 hours. Recent Labs  Lab 08/10/21 2133  GLUCAP 107*   Cardiac Enzymes: No results for input(s):  CKTOTAL, CKMB, CKMBINDEX, TROPONINI in the  last 168 hours. No results for input(s): PROBNP in the last 8760 hours. Coagulation Profile: No results for input(s): INR, PROTIME in the last 168 hours. Thyroid Function Tests: No results for input(s): TSH, T4TOTAL, FREET4, T3FREE, THYROIDAB in the last 72 hours. Lipid Profile: Recent Labs    08/13/21 0617  CHOL 128  HDL 69  LDLCALC 48  TRIG 56  CHOLHDL 1.9   Anemia Panel: No results for input(s): VITAMINB12, FOLATE, FERRITIN, TIBC, IRON, RETICCTPCT in the last 72 hours. Urine analysis:    Component Value Date/Time   COLORURINE YELLOW 07/09/2021 [place] 07/09/2021 0857   APPEARANCEUR Clear 05/28/2014 0855   LABSPEC 1.010 07/09/2021 0857   LABSPEC 1.013 05/28/2014 0855   PHURINE 5.5 07/09/2021 [place] 07/09/2021 0857   GLUCOSEU Negative 05/28/2014 0855   HGBUR NEGATIVE 07/09/2021 [place] 07/09/2021 0857   BILIRUBINUR Negative 05/28/2014 [place] 07/09/2021 [place] 07/09/2021 0857   NITRITE NEGATIVE 07/09/2021 0857   LEUKOCYTESUR NEGATIVE 07/09/2021 0857   LEUKOCYTESUR Negative 05/28/2014 0855   Sepsis Labs: Invalid input(s): PROCALCITONIN, [place]  Microbiology: Recent Results (from the past 240 hour(s))  Resp Panel by RT-PCR (Flu A&B, Covid) Nasopharyngeal Swab     Status: None   Collection Time: 08/07/21  4:13 AM   Specimen: Nasopharyngeal Swab; Nasopharyngeal(NP) swabs in vial transport medium  Result Value Ref Range Status   SARS Coronavirus 2 by RT PCR NEGATIVE NEGATIVE Final    Comment: (NOTE) SARS-CoV-2 target nucleic acids are NOT DETECTED.  The SARS-CoV-2 RNA is generally detectable in upper respiratory specimens during the acute phase of infection. The lowest concentration of SARS-CoV-2 viral copies this assay can detect is 138 copies/mL. A negative result does not preclude SARS-Cov-2 infection and should not be used  as the sole basis for treatment or other patient management decisions. A negative result may occur with  improper specimen collection/handling, submission of specimen other than nasopharyngeal swab, presence of viral mutation(s) within the areas targeted by this assay, and inadequate number of viral copies(<138 copies/mL). A negative result must be combined with clinical observations, patient history, and epidemiological information. The expected result is Negative.  Fact Sheet for Patients:  EntrepreneurPulse.com.au  Fact Sheet for Healthcare Providers:  IncredibleEmployment.be  This test is no t yet approved or cleared by the [place] FDA and  has been authorized for detection and/or diagnosis of SARS-CoV-2 by FDA under an Emergency Use Authorization (EUA). This EUA will remain  in effect (meaning this test can be used) for the duration of the COVID-19 declaration under Section 564(b)(1) of the Act, 21 U.S.C.section 360bbb-3(b)(1), unless the authorization is terminated  or revoked sooner.       Influenza A by PCR NEGATIVE NEGATIVE Final   Influenza B by PCR NEGATIVE NEGATIVE Final    Comment: (NOTE) The Xpert Xpress SARS-CoV-2/FLU/RSV plus assay is intended as an aid in the diagnosis of influenza from Nasopharyngeal swab specimens and should not be used as a sole basis for treatment. Nasal washings and aspirates are unacceptable for Xpert Xpress SARS-CoV-2/FLU/RSV testing.  Fact Sheet for Patients: EntrepreneurPulse.com.au  Fact Sheet for Healthcare Providers: IncredibleEmployment.be  This test is not yet approved or cleared by the [place] FDA and has been authorized for detection and/or diagnosis of SARS-CoV-2 by FDA under an Emergency Use Authorization (EUA). This EUA will remain in effect (meaning this test can be used) for the duration of the COVID-19  declaration under Section 564(b)(1)  of the Act, 21 U.S.C. section 360bbb-3(b)(1), unless the authorization is terminated or revoked.  Performed at [facility] [address] [place], [place] [address]   Culture, blood (Routine X 2) w Reflex to ID Panel     Status: None   Collection Time: 08/07/21  4:36 AM   Specimen: BLOOD RIGHT HAND  Result Value Ref Range Status   Specimen Description BLOOD RIGHT HAND  Final   Special Requests   Final    BOTTLES DRAWN AEROBIC AND ANAEROBIC Blood Culture results may not be optimal due to an inadequate volume of blood received in culture bottles   Culture   Final    NO GROWTH 5 DAYS Performed at [facility], [address] [place], [place] [address]    Report Status 08/12/2021 FINAL  Final  Culture, blood (Routine X 2) w Reflex to ID Panel     Status: None   Collection Time: 08/07/21  5:07 AM   Specimen: BLOOD RIGHT FOREARM  Result Value Ref Range Status   Specimen Description BLOOD RIGHT FOREARM  Final   Special Requests   Final    BOTTLES DRAWN AEROBIC AND ANAEROBIC Blood Culture adequate volume   Culture   Final    NO GROWTH 5 DAYS Performed at [facility], [address], [place], [place] [address]    Report Status 08/12/2021 FINAL  Final  Expectorated Sputum Assessment w Gram Stain, Rflx to Resp Cult     Status: None   Collection Time: 08/07/21  8:32 PM   Specimen: Sputum  Result Value Ref Range Status   Specimen Description SPUTUM  Final   Special Requests NONE  Final   Sputum evaluation   Final    THIS SPECIMEN IS ACCEPTABLE FOR SPUTUM CULTURE Performed at [facility], [address] [place], [place] [address]    Report Status 08/07/2021 FINAL  Final  Culture, Respiratory w Gram Stain     Status: None   Collection Time: 08/07/21  8:32 PM   Specimen: SPU  Result Value Ref Range Status   Specimen Description   Final    SPUTUM Performed at [facility], [address] [place], [place] [address]    Special  Requests   Final    NONE Reflexed from [id number] Performed at [facility], [address] [place], [place] [address]    Gram Stain   Final    MODERATE WBC PRESENT,BOTH PMN AND MONONUCLEAR RARE YEAST FEW GRAM POSITIVE COCCI IN PAIRS FEW GRAM VARIABLE ROD    Culture   Final    FEW ACINETOBACTER BAUMANNII MODERATE DIPHTHEROIDS(CORYNEBACTERIUM SPECIES) Standardized susceptibility testing for this organism is not available. Performed at [facility], [place] [address] [place], [place] [address]    Report Status 08/10/2021 FINAL  Final   Organism ID, Bacteria ACINETOBACTER BAUMANNII  Final      Susceptibility   Acinetobacter baumannii - MIC*    CEFTAZIDIME 4 SENSITIVE Sensitive     CIPROFLOXACIN >=4 RESISTANT Resistant     GENTAMICIN 2 SENSITIVE Sensitive     IMIPENEM <=0.25 SENSITIVE Sensitive     PIP/TAZO <=4 SENSITIVE Sensitive     TRIMETH/SULFA <=20 SENSITIVE Sensitive     AMPICILLIN/SULBACTAM <=2 SENSITIVE Sensitive     * FEW ACINETOBACTER BAUMANNII  Body fluid culture w Gram Stain     Status: None   Collection Time: 08/08/21  9:45 AM   Specimen: PATH Cytology Pleural  fluid  Result Value Ref Range Status   Specimen Description   Final    PLEURAL Performed at [facility], [address] [place], [place] [address]    Special Requests   Final    PLEURAL Performed at [facility], [address] [place], [place] [address]    Gram Stain   Final    WBC PRESENT, PREDOMINANTLY MONONUCLEAR NO ORGANISMS SEEN CYTOSPIN SMEAR    Culture   Final    NO GROWTH 3 DAYS Performed at [facility], [place] [address] [place], [place] [address]    Report Status 08/11/2021 FINAL  Final  Resp Panel by RT-PCR (Flu A&B, Covid) Nasopharyngeal Swab     Status: None   Collection Time: 08/12/21  6:25 AM   Specimen: Nasopharyngeal Swab; Nasopharyngeal(NP) swabs in vial transport medium  Result Value Ref Range Status   SARS Coronavirus 2 by RT PCR NEGATIVE  NEGATIVE Final    Comment: (NOTE) SARS-CoV-2 target nucleic acids are NOT DETECTED.  The SARS-CoV-2 RNA is generally detectable in upper respiratory specimens during the acute phase of infection. The lowest concentration of SARS-CoV-2 viral copies this assay can detect is 138 copies/mL. A negative result does not preclude SARS-Cov-2 infection and should not be used as the sole basis for treatment or other patient management decisions. A negative result may occur with  improper specimen collection/handling, submission of specimen other than nasopharyngeal swab, presence of viral mutation(s) within the areas targeted by this assay, and inadequate number of viral copies(<138 copies/mL). A negative result must be combined with clinical observations, patient history, and epidemiological information. The expected result is Negative.  Fact Sheet for Patients:  EntrepreneurPulse.com.au  Fact Sheet for Healthcare Providers:  IncredibleEmployment.be  This test is no t yet approved or cleared by the [place] FDA and  has been authorized for detection and/or diagnosis of SARS-CoV-2 by FDA under an Emergency Use Authorization (EUA). This EUA will remain  in effect (meaning this test can be used) for the duration of the COVID-19 declaration under Section 564(b)(1) of the Act, 21 U.S.C.section 360bbb-3(b)(1), unless the authorization is terminated  or revoked sooner.       Influenza A by PCR NEGATIVE NEGATIVE Final   Influenza B by PCR NEGATIVE NEGATIVE Final    Comment: (NOTE) The Xpert Xpress SARS-CoV-2/FLU/RSV plus assay is intended as an aid in the diagnosis of influenza from Nasopharyngeal swab specimens and should not be used as a sole basis for treatment. Nasal washings and aspirates are unacceptable for Xpert Xpress SARS-CoV-2/FLU/RSV testing.  Fact Sheet for Patients: EntrepreneurPulse.com.au  Fact Sheet for Healthcare  Providers: IncredibleEmployment.be  This test is not yet approved or cleared by the [place] FDA and has been authorized for detection and/or diagnosis of SARS-CoV-2 by FDA under an Emergency Use Authorization (EUA). This EUA will remain in effect (meaning this test can be used) for the duration of the COVID-19 declaration under Section 564(b)(1) of the Act, 21 U.S.C. section 360bbb-3(b)(1), unless the authorization is terminated or revoked.  Performed at [facility], [address] [place], [place] [address]     Radiology Studies: No results found.  60 minutes with more than 50% spent in reviewing records, counseling patient/family and coordinating care.   Dalyn Becker T. [place]  If 7PM-7AM, please contact night-coverage [email or web address] 08/13/2021, 1:08 PM

## 2021-08-13 NOTE — ED Notes (Addendum)
Pt more awake, eyes open. Took nasal cannula, heart monitor, SPO2, blood pressure off. Reoriented pt and reconnected back on the monitor

## 2021-08-13 NOTE — ED Notes (Signed)
MD Cyndia Skeeters aware of pts HR and rhythm as well as pt having difficulty swallowing her PO meds at this time. Even when in apple sauce.

## 2021-08-13 NOTE — Evaluation (Signed)
Occupational Therapy Evaluation Patient Details Name: Felicia Sellers MRN: [id number] DOB: 07/04/1941 Today's Date: 08/13/2021   History of Present Illness Pt is an 80 y/o F admitted on 08/12/21 with c/c of SOB. Pt recently hospitalized 10/16-10/20 2/2 COPD exacerbation & L pleural effusion, undergoing thoracentesis. Pt is currently being treated for acute on chronic respiratory failure with hypoxia due to combination of COPD & CHF exacerbation & sepsis 2/2 COPD exacerbation. PMH: COPD on 4L O2, HTN, HLD, a-fib on anticoagulants, stomach ulcer, CAD, CHF, lung CA s/p R lobectomy, tobacco & alcohol abuse   Clinical Impression   Pt seen for OT Evaluation this date in setting of acute hospitalization d/t SOB. Pt reports being INDEP with fxl mobility and BADLs at baseline, but is currently a poor historian 2/2 confusion. She reports she has a walker, but rarely uses. Pt is currently living with nephew, but reports this is not going well. Pt presents with decreased activity tolerance, weakness, and general deconditioning this date. On OT assessment, Pt currently requiring: SETUP to MIN A for seated UB ADLs such as dressing, requires MOD A for LB ADLs such as donning socks and clothing mgt over hips in standing (one person assisting with standing balance, 1 person assisting to don brief). Pt requires MOD A for bed mobility to come to sitting and MIN A +2 for transfers with RW (MOD tactile cues for safe use of RW). PT returned to bed at end of session with clean linens, brief, and pure-wick. Call light in reach and bed alarm set. OT Will continue to follow acutely and anticipate that pt will require f/u OT services in SNF setting.     Recommendations for follow up therapy are one component of a multi-disciplinary discharge planning process, led by the attending physician.  Recommendations may be updated based on patient status, additional functional criteria and insurance authorization.   Follow Up  Recommendations  SNF    Equipment Recommendations  Other (comment) (defer to next level of care)    Recommendations for Other Services       Precautions / Restrictions Precautions Precautions: Fall Restrictions Weight Bearing Restrictions: No      Mobility Bed Mobility Overal bed mobility: Needs Assistance Bed Mobility: Supine to Sit;Sit to Supine     Supine to sit: Mod assist;HOB elevated Sit to supine: Supervision        Transfers Overall transfer level: Needs assistance Equipment used: Rolling walker (2 wheeled) Transfers: Sit to/from Stand Sit to Stand: Min assist;+2 physical assistance;+2 safety/equipment         General transfer comment: demos poor understanding of safe sequence for hand placement with use of FWW    Balance Overall balance assessment: Needs assistance Sitting-balance support: Feet unsupported;No upper extremity supported;Bilateral upper extremity supported Sitting balance-Leahy Scale: Poor Sitting balance - Comments: L lateral lean, mostly appears d/t fatigue/unwillingness to sit up and participate in mobilizing.   Standing balance support: Bilateral upper extremity supported;During functional activity Standing balance-Leahy Scale: Poor Standing balance comment: requires UE Support as well as external support.                           ADL either performed or assessed with clinical judgement   ADL Overall ADL's : Needs assistance/impaired  General ADL Comments: Pt requires SETUP to MIN A for seated UB ADLs such as dressing, requires MOD A for LB ADLs such as donning socks and clothing mgt over hips in standing (one person assisting with standing balance, 1 person assisting to don brief). Pt requires MOD A for bed mobility to come to sitting and MIN A +2 for transfers with RW (MOD tactile cues for safe use of RW).     Vision Baseline Vision/History: 1 Wears  glasses Patient Visual Report: No change from baseline       Perception     Praxis      Pertinent Vitals/Pain Pain Assessment: Faces Faces Pain Scale: No hurt     Hand Dominance Right   Extremity/Trunk Assessment Upper Extremity Assessment Upper Extremity Assessment: Generalized weakness   Lower Extremity Assessment Lower Extremity Assessment: Generalized weakness   Cervical / Trunk Assessment Cervical / Trunk Assessment: Kyphotic   Communication Communication Communication: HOH   Cognition Arousal/Alertness: Awake/alert Behavior During Therapy: Flat affect Overall Cognitive Status: No family/caregiver present to determine baseline cognitive functioning                                 General Comments: AxO to self, not oriented to time or place (does know she's in the hospital but unsure which), follows most one step commands with increased time to process. Somewhat drowsy/sleepy. Very poor motivation re: participation.   General Comments  Pt on 3L/min via nasal cannula, SpO2 >90%, max HR 128 bpm (MD cleared pt for participation with guidelines to maintain HR <140 bpm)    Exercises Other Exercises Other Exercises: OT ed re: importance of OOB Activity and mobilizing to prevent atrophy as well as ed re: safe use of FWW. Pt with poor understanding and carryover.   Shoulder Instructions      Home Living Family/patient expects to be discharged to:: Private residence Living Arrangements: Other relatives (nephew) Available Help at Discharge: Family;Available PRN/intermittently Type of Home: House Home Access: Stairs to enter [employer] of Steps: 5 Entrance Stairs-Rails: Right;Left;Can reach both Home Layout: One level     Bathroom Shower/Tub: Teacher, early years/pre: Standard     Home Equipment: Environmental consultant - 2 wheels;Cane - single point   Additional Comments: Reports doesn't use AD      Prior Functioning/Environment Level of  Independence: Needs assistance  Gait / Transfers Assistance Needed: Pt reports she occasionally uses RW but typically not, does endorse frequent falls. ADL's / Homemaking Assistance Needed: pt reports she is able to do her basic ADLs herself. Pt is somewhat confused and poor historian this date. Most info taken from previous hospitalizaitons, appears her nephew assisted wtih transportation and getting groceries.            OT Problem List: Decreased strength;Cardiopulmonary status limiting activity;Impaired balance (sitting and/or standing)      OT Treatment/Interventions: Self-care/ADL training;Therapeutic exercise;Therapeutic activities;Energy conservation;DME and/or AE instruction;Balance training;Patient/family education    OT Goals(Current goals can be found in the care plan section) Acute Rehab OT Goals Patient Stated Goal: to sleep OT Goal Formulation: With patient Time For Goal Achievement: 08/27/21 Potential to Achieve Goals: Good ADL Goals Pt Will Perform Upper Body Dressing: with set-up;sitting (with G sitting balance to improve balance for seated ADLs.) Pt Will Perform Lower Body Dressing: with min guard assist;sitting/lateral leans (with AE PRN) Pt Will Transfer to Toilet: with supervision;ambulating (~10-15' to [facility]) Pt Will  Perform Toileting - Clothing Manipulation and hygiene: with supervision;sitting/lateral leans  OT Frequency: Min 2X/week   Barriers to D/C:            Co-evaluation PT/OT/SLP Co-Evaluation/Treatment: Yes Reason for Co-Treatment: Necessary to address cognition/behavior during functional activity;To address functional/ADL transfers PT goals addressed during session: Mobility/safety with mobility;Proper use of DME OT goals addressed during session: ADL's and self-care      AM-PAC OT "6 Clicks" Daily Activity     Outcome Measure Help from another person eating meals?: None Help from another person taking care of personal grooming?: A Little Help  from another person toileting, which includes using toliet, bedpan, or urinal?: A Lot Help from another person bathing (including washing, rinsing, drying)?: A Lot Help from another person to put on and taking off regular upper body clothing?: A Little Help from another person to put on and taking off regular lower body clothing?: A Lot 6 Click Score: 16   End of Session Equipment Utilized During Treatment: Gait belt;Rolling walker Nurse Communication: Mobility status  Activity Tolerance: Patient tolerated treatment well Patient left: in bed;with call bell/phone within reach;with bed alarm set  OT Visit Diagnosis: Other abnormalities of gait and mobility (R26.89);Muscle weakness (generalized) (M62.81)                Time: 4268-3419 OT Time Calculation (min): 18 min Charges:  OT General Charges $OT Visit: 1 Visit OT Evaluation $OT Eval Moderate Complexity: [place], [place], OTR/L ascom [phone] 08/13/21, 3:39 PM

## 2021-08-13 NOTE — Evaluation (Signed)
Physical Therapy Evaluation Patient Details Name: Felicia Sellers MRN: [id number] DOB: 21-Aug-1941 Today's Date: 08/13/2021  History of Present Illness  Pt is an 80 y/o F admitted on 08/12/21 with c/c of SOB. Pt recently hospitalized 10/16-10/20 2/2 COPD exacerbation & L pleural effusion, undergoing thoracentesis. Pt is currently being treated for acute on chronic respiratory failure with hypoxia due to combination of COPD & CHF exacerbation & sepsis 2/2 COPD exacerbation. PMH: COPD on 4L O2, HTN, HLD, a-fib on anticoagulants, stomach ulcer, CAD, CHF, lung CA s/p R lobectomy, tobacco & alcohol abuse  Clinical Impression  Pt seen for PT evaluation with co-tx with OT. Pt demonstrates very poor motivation to participate, even to hold herself upright while sitting EOB. Pt requires up to mod assist for supine>sit and min assist +2 for sit>stand. Pt requires max encouragement & assistance to manage RW to take a few side steps to L at EOB. Pt also frequently voiding during session with PT & OT providing total assist to change into clean brief. Pt would benefit from STR upon d/c to maximize independence, reduce fall risk & decrease caregiver burden prior to return home.        Recommendations for follow up therapy are one component of a multi-disciplinary discharge planning process, led by the attending physician.  Recommendations may be updated based on patient status, additional functional criteria and insurance authorization.  Follow Up Recommendations SNF    Equipment Recommendations  None recommended by PT    Recommendations for Other Services       Precautions / Restrictions Precautions Precautions: Fall Restrictions Weight Bearing Restrictions: No      Mobility  Bed Mobility Overal bed mobility: Needs Assistance Bed Mobility: Supine to Sit;Sit to Supine     Supine to sit: Mod assist;HOB elevated Sit to supine: Supervision        Transfers Overall transfer level: Needs  assistance Equipment used: Rolling walker (2 wheeled) Transfers: Sit to/from Stand Sit to Stand: Min assist         General transfer comment: cuing but poor demo of safe hand placement  Ambulation/Gait             General Gait Details: Pt takes ~3 side steps to L at EOB with min assist +2 & cuing for RW management & sequencing.  Stairs            Wheelchair Mobility    Modified Rankin (Stroke Patients Only)       Balance Overall balance assessment: Needs assistance Sitting-balance support: Feet unsupported;No upper extremity supported;Bilateral upper extremity supported Sitting balance-Leahy Scale: Poor Sitting balance - Comments: L lateral lean but is able to maintain midline orientation when she is willing otherwise, continues to lose balance to L   Standing balance support: Bilateral upper extremity supported;During functional activity Standing balance-Leahy Scale: Poor                               Pertinent Vitals/Pain Pain Assessment: Faces Faces Pain Scale: No hurt    Home Living Family/patient expects to be discharged to:: Private residence Living Arrangements:  (nephew) Available Help at Discharge: Family;Available PRN/intermittently Type of Home: House Home Access: Stairs to enter Entrance Stairs-Rails: Right;Left;Can reach both Entrance Stairs-Number of Steps: 5 Home Layout: One level Home Equipment: Walker - 2 wheels;Cane - single point      Prior Function Level of Independence: Needs assistance   Gait / Transfers Assistance Needed: Pt  reports she occasionally uses RW but typically not, does endorse frequent falls.           Hand Dominance        Extremity/Trunk Assessment   Upper Extremity Assessment Upper Extremity Assessment: Generalized weakness    Lower Extremity Assessment Lower Extremity Assessment: Generalized weakness    Cervical / Trunk Assessment Cervical / Trunk Assessment: Kyphotic  Communication    Communication: HOH  Cognition Arousal/Alertness: Awake/alert Behavior During Therapy: Flat affect Overall Cognitive Status: No family/caregiver present to determine baseline cognitive functioning                                 General Comments: AxO to self, not oriented to time or place (does know she's in the hospital but unsure which). Very poor motivation re: participation.      General Comments General comments (skin integrity, edema, etc.): Pt on 3L/min via nasal cannula, SpO2 >90%, max HR 128 bpm (MD cleared pt for participation with guidelines to maintain HR <140 bpm)    Exercises     Assessment/Plan    PT Assessment Patient needs continued PT services  PT Problem List Decreased strength;Cardiopulmonary status limiting activity;Decreased range of motion;Decreased cognition;Decreased activity tolerance;Decreased knowledge of use of DME;Decreased balance;Decreased safety awareness;Decreased mobility;Decreased knowledge of precautions       PT Treatment Interventions DME instruction;Therapeutic exercise;Gait training;Balance training;Stair training;Neuromuscular re-education;Modalities;Functional mobility training;Therapeutic activities;Patient/family education;Cognitive remediation    PT Goals (Current goals can be found in the Care Plan section)  Acute Rehab PT Goals Patient Stated Goal: to sleep PT Goal Formulation: With patient Time For Goal Achievement: 08/27/21 Potential to Achieve Goals: Good    Frequency Min 2X/week   Barriers to discharge Decreased caregiver support;Inaccessible home environment Does not allude to ideal living situation with her nephew    Co-evaluation PT/OT/SLP Co-Evaluation/Treatment: Yes Reason for Co-Treatment: Necessary to address cognition/behavior during functional activity;To address functional/ADL transfers PT goals addressed during session: Mobility/safety with mobility;Balance;Proper use of DME         AM-PAC PT  "6 Clicks" Mobility  Outcome Measure Help needed turning from your back to your side while in a flat bed without using bedrails?: A Little Help needed moving from lying on your back to sitting on the side of a flat bed without using bedrails?: A Lot Help needed moving to and from a bed to a chair (including a wheelchair)?: A Lot Help needed standing up from a chair using your arms (e.g., wheelchair or bedside chair)?: A Lot Help needed to walk in hospital room?: A Lot Help needed climbing 3-5 steps with a railing? : A Lot 6 Click Score: 13    End of Session Equipment Utilized During Treatment: Gait belt;Oxygen Activity Tolerance:  (pt self limiting) Patient left: with bed alarm set;in bed;with call bell/phone within reach Nurse Communication: Mobility status PT Visit Diagnosis: Unsteadiness on feet (R26.81);Muscle weakness (generalized) (M62.81);History of falling (Z91.81);Difficulty in walking, not elsewhere classified (R26.2)    Time: 0354-6568 PT Time Calculation (min) (ACUTE ONLY): 19 min   Charges:   PT Evaluation $PT Eval Low Complexity: [place], PT, DPT 08/13/21, 3:29 PM   Waunita Schooner 08/13/2021, 3:27 PM

## 2021-08-14 DIAGNOSIS — I4891 Unspecified atrial fibrillation: Secondary | ICD-10-CM | POA: Diagnosis not present

## 2021-08-14 DIAGNOSIS — I5033 Acute on chronic diastolic (congestive) heart failure: Secondary | ICD-10-CM | POA: Diagnosis not present

## 2021-08-14 DIAGNOSIS — J9621 Acute and chronic respiratory failure with hypoxia: Secondary | ICD-10-CM | POA: Diagnosis not present

## 2021-08-14 DIAGNOSIS — F1010 Alcohol abuse, uncomplicated: Secondary | ICD-10-CM | POA: Diagnosis not present

## 2021-08-14 LAB — CBC WITH DIFFERENTIAL/PLATELET
Abs Immature Granulocytes: 0.07 10*3/uL (ref 0.00–0.07)
Basophils Absolute: 0 10*3/uL (ref 0.0–0.1)
Basophils Relative: 0 %
Eosinophils Absolute: 0 10*3/uL (ref 0.0–0.5)
Eosinophils Relative: 0 %
HCT: 42.1 % (ref 36.0–46.0)
Hemoglobin: 14.4 g/dL (ref 12.0–15.0)
Immature Granulocytes: 1 %
Lymphocytes Relative: 15 %
Lymphs Abs: 1.5 10*3/uL (ref 0.7–4.0)
MCH: 31.7 pg (ref 26.0–34.0)
MCHC: 34.2 g/dL (ref 30.0–36.0)
MCV: 92.7 fL (ref 80.0–100.0)
Monocytes Absolute: 0.9 10*3/uL (ref 0.1–1.0)
Monocytes Relative: 10 %
Neutro Abs: 7.1 10*3/uL (ref 1.7–7.7)
Neutrophils Relative %: 74 %
Platelets: 281 10*3/uL (ref 150–400)
RBC: 4.54 MIL/uL (ref 3.87–5.11)
RDW: 15.3 % (ref 11.5–15.5)
WBC: 9.6 10*3/uL (ref 4.0–10.5)
nRBC: 0 % (ref 0.0–0.2)

## 2021-08-14 LAB — BRAIN NATRIURETIC PEPTIDE: B Natriuretic Peptide: 734.6 pg/mL — ABNORMAL HIGH (ref 0.0–100.0)

## 2021-08-14 LAB — RENAL FUNCTION PANEL
Albumin: 3.4 g/dL — ABNORMAL LOW (ref 3.5–5.0)
Anion gap: 11 (ref 5–15)
BUN: 19 mg/dL (ref 8–23)
CO2: 35 mmol/L — ABNORMAL HIGH (ref 22–32)
Calcium: 8.4 mg/dL — ABNORMAL LOW (ref 8.9–10.3)
Chloride: 86 mmol/L — ABNORMAL LOW (ref 98–111)
Creatinine, Ser: 0.6 mg/dL (ref 0.44–1.00)
GFR, Estimated: >60 mL/min (ref >60)
Glucose, Bld: 96 mg/dL (ref 70–99)
Phosphorus: 4.2 mg/dL (ref 2.5–4.6)
Potassium: 3.7 mmol/L (ref 3.5–5.1)
Sodium: 132 mmol/L — ABNORMAL LOW (ref 135–145)

## 2021-08-14 LAB — MAGNESIUM: Magnesium: 1.8 mg/dL (ref 1.7–2.4)

## 2021-08-14 LAB — TSH: TSH: 2.653 u[IU]/mL (ref 0.350–4.500)

## 2021-08-14 LAB — AMMONIA: Ammonia: 17 umol/L (ref 9–35)

## 2021-08-14 MED ORDER — MAGNESIUM SULFATE 2 GM/50ML IV SOLN
2.0000 g | Freq: Once | INTRAVENOUS | Status: AC
  Administered 2021-08-14: 2 g via INTRAVENOUS
  Filled 2021-08-14: qty 50

## 2021-08-14 MED ORDER — IPRATROPIUM-ALBUTEROL 0.5-2.5 (3) MG/3ML IN SOLN
3.0000 mL | Freq: Four times a day (QID) | RESPIRATORY_TRACT | Status: DC
  Administered 2021-08-14 – 2021-08-20 (×23): 3 mL via RESPIRATORY_TRACT
  Filled 2021-08-14 (×24): qty 3

## 2021-08-14 MED ORDER — POTASSIUM CHLORIDE CRYS ER 20 MEQ PO TBCR
40.0000 meq | EXTENDED_RELEASE_TABLET | Freq: Once | ORAL | Status: AC
  Administered 2021-08-14: 40 meq via ORAL
  Filled 2021-08-14: qty 4

## 2021-08-14 NOTE — TOC Progression Note (Signed)
Transition of Care [facility]) - Progression Note    Patient Details  Name: Felicia Sellers MRN: [id number] Date of Birth: Mar 05, 1941  [facility] of [facility]) CM/SW Contact  Zigmund Daniel Dorian Pod, RN Phone [phone] 08/14/2021, 4:30 PM  Clinical Narrative:    TOC attempted outreach calls to completed the initial assessment and discharge disposition with accepting facilities however unsuccessful reaching the nephew [name]) lvm.  Several SNF accepted pt for rehabilitation.   TOC will continue to follow up accordingly.        Expected Discharge Plan and Services                                                 Social Determinants of Health (SDOH) Interventions    Readmission Risk Interventions No flowsheet data found.

## 2021-08-14 NOTE — Progress Notes (Signed)
PROGRESS NOTE  Lakin Rhine CXK:[id number] DOB: [birth year]   PCP: [facility]  Patient is from: Home.  Reportedly independent for most ADLs at baseline.  DOA: 08/12/2021 LOS: 2  Chief complaints:  Chief Complaint  Patient presents with   Respiratory Distress    Pt in via EMS from pt's home for SOB. EMS report pt's SPO2 at home on 4l 91%. EMS report pt's family states they can't take care of her anymore. EMS place pt on 6l n/c 96%.     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 fib not on AC, stomach ulcer, CAD, diastolic CHF, lung cancer s/p right lobectomy, tobacco use disorder, alcohol abuse, ovarian mass, chronic hyponatremia, severe malnutrition, HTN, HLD and recurrent hospitalization with respiratory failure returning with shortness of breath and admitted with acute on chronic RF with hypoxia due to COPD and CHF exacerbation.  Initially required BiPAP.  Started on IV Zosyn, IV Lasix, steroid and breathing treatments.  Subjective: Seen and examined earlier this morning.  No major events overnight of this morning.  No complaints.  Reports improvement in her breathing.  She denies chest pain, abdominal pain or UTI symptoms.  Objective: Vitals:   08/14/21 0754 08/14/21 1143 08/14/21 1244 08/14/21 1340  BP:  107/84    Pulse: 98 (!) 106 95   Resp:  18    Temp:  98 F (36.7 C)    TempSrc:      SpO2:  100%  99%  Weight:      Height:        Intake/Output Summary (Last 24 hours) at 08/14/2021 1450 Last data filed at 08/14/2021 1142 Gross per 24 hour  Intake 715.19 ml  Output 3150 ml  Net -2434.81 ml   Filed Weights   08/12/21 0641 08/14/21 0512  Weight: 51.6 kg 52.2 kg    Examination:  GENERAL: Frail looking elderly female. HEENT: MMM.  Vision and hearing grossly intact.  NECK: Supple.  No apparent JVD.  RESP: 93% on 2 L.  No IWOB.  Very rhonchorous. CVS:  RRR. Heart sounds normal.  ABD/GI/GU: BS+. Abd soft,  NTND.  MSK/EXT:  Moves extremities. No apparent deformity. No edema.  SKIN: no apparent skin lesion or wound NEURO: Awake and alert. Oriented x4.  No apparent focal neuro deficit. PSYCH: Calm. Normal affect.     Procedures:  None  Microbiology summarized: [id number] and influenza PCR nonreactive. Sputum culture pending. Blood culture pending.  Assessment & Plan: Acute on chronic respiratory failure with hypoxia due to combination of COPD, CHF, A. fib, pleural effusion and underlying lung cancer.  Third hospitalization for the same issue in less than 5 weeks.  She was discharged on 10/16 on p.o. Augmentin, Bactrim and 4 L by [place].  Currently saturating at 99% on 3 L. -Treat treatable causes as below. -Wean oxygen as able-minimum oxygen to keep saturation above 90% -Follow palliative medicine recs.   Acute on chronic diastolic CHF (congestive heart failure): TTE in 06/2021 with LVEF of 55 to 60% (from 70 to 75% in 2021) and moderate TVR. 07/09/2021 showed EF of 55 to 60%.  She had edema, JVD, rhonchi and rales on exam.  CXR with pulmonary edema and increased pleural effusion.  BNP elevated to 770.  Started on IV Lasix.  She had 3.3 L UOP/24 hours.  Net -2.5 L.  Renal function stable. -Continue IV Lasix 40 mg twice daily -Discontinued amlodipine and lisinopril to allow  room for diuresis. -Monitor fluid status, renal functions and electrolytes. -Sodium and fluid restrictions -REDs Vest reading   COPD exacerbation: Sputum culture grew Acinetobacter and moderate diphtheroid prior hospitalization.  She was discharged on Augmentin and Bactrim.  Remains rhonchorous on exam.  Has leukocytosis but Pro-Cal negative.  Ongoing tobacco use.  -Discontinued Augmentin and Bactrim -Changed since Zosyn to IV cefepime and azithromycin -Continue IV Solu-Medrol and bronchodilators -Continue mucolytic's/antitussive/IS -OOB/PT/OT -Titrate oxygen for saturation above 90% -Follow sputum cultures  Persistent A.  fib with RVR: RVR resolved.  Not on anticoagulation likely due to history of stomach ulcer.  On Toprol-XL at home. -Metoprolol 25 mg twice daily  Acute metabolic encephalopathy: Resolved.  Oriented x4 this morning. -Reorientation delirium precautions. -Minimize or avoid sedating medications  Dysphagia: Likely in the setting of encephalopathy. -Continue dysphagia 3 diet -SLP eval   Sepsis ruled out.    Essential hypertension: Normotensive. -IV Lasix and metoprolol as above -Discontinued amlodipine and lisinopril   Tobacco use disorder: Reportedly smokes about a pack a day. -Cessation counseling when able to comprehend. -Continue nicotine patch  Alcohol abuse-Per recent discharge summary, sober over the last 1 month. -Continue CIWA, multivitamin, folic acid and thiamine   Personal history of lung cancer -s/p of right lobectomy, 1/3 of right lung   Chronic hyponatremia: Improved.  Could be due to CHF and Bactrim. -Continue IV Lasix -Fluid and sodium restriction   Elevated troponin and history of CAD: Troponin is minimally elevated 19 > 23 > 18.  Likely demand ischemia -Cardiac meds as above -Continue home statin.  Hypokalemia: K 3.7.  Mg 1.8. -P.o. KCl 40x1 -IV magnesium sulfate 2 g x 1  Physical deconditioning -PT/OT recommended SNF.   Goal of care counseling-appropriately DNR/DNI.  A lot of comorbidity.  Poor long-term prognosis. -Palliative medicine consulted on admission    Severe malnutrition: As evidenced by low BMI and significant muscle mass and subcu fat loss. Body mass index is 20.39 kg/m.  -Consult dietitian       DVT prophylaxis:  heparin injection 5,000 Units Start: 08/13/21 1400  Code Status: DNR/DNI Family Communication: Patient and/or RN. Available if any question.  Level of care: Med-Surg Status is: Inpatient  Remains inpatient appropriate because: IV medication and need for palliative medicine safe disposition/SNF    Consultants:   Palliative medicine   Sch Meds:  Scheduled Meds:  atorvastatin  40 mg Oral QHS   calcium carbonate  1 tablet Oral BID WC   cholecalciferol  1,000 Units Oral Daily   docusate sodium  100 mg Oral Daily   folic acid  1 mg Oral Daily   furosemide  40 mg Intravenous Q12H   gabapentin  300 mg Oral TID   heparin  5,000 Units Subcutaneous Q8H   ipratropium-albuterol  3 mL Nebulization Q6H   LORazepam  0-4 mg Intravenous Q12H   methylPREDNISolone (SOLU-MEDROL) injection  40 mg Intravenous Q12H   metoprolol tartrate  25 mg Oral BID   multivitamin with minerals  1 tablet Oral Daily   nicotine  21 mg Transdermal Daily   sodium chloride  2 g Oral TID   thiamine  100 mg Oral Daily   Or   thiamine  100 mg Intravenous Daily   Continuous Infusions:  sodium chloride Stopped (08/13/21 1517)   azithromycin 500 mg (08/14/21 1304)   ceFEPime (MAXIPIME) IV 2 g (08/14/21 0915)   PRN Meds:.sodium chloride, acetaminophen, albuterol, dextromethorphan-guaiFENesin, diclofenac Sodium, loperamide, LORazepam **OR** LORazepam, ondansetron (ZOFRAN) IV  Antimicrobials: Anti-infectives (  From admission, onward)    Start     Dose/Rate Route Frequency Ordered Stop   08/13/21 1400  amoxicillin-clavulanate (AUGMENTIN) 500-125 MG per tablet 500 mg  Status:  Discontinued        1 tablet Oral Every 8 hours 08/13/21 1110 08/13/21 1233   08/13/21 1400  azithromycin (ZITHROMAX) 500 mg in sodium chloride 0.9 % 250 mL IVPB        500 mg 250 mL/hr over 60 Minutes Intravenous Every 24 hours 08/13/21 1254 08/16/21 1359   08/13/21 1345  ceFEPIme (MAXIPIME) 2 g in sodium chloride 0.9 % 100 mL IVPB        2 g 200 mL/hr over 30 Minutes Intravenous Every 12 hours 08/13/21 1330     08/13/21 1300  metroNIDAZOLE (FLAGYL) IVPB 500 mg  Status:  Discontinued        500 mg 100 mL/hr over 60 Minutes Intravenous Every 12 hours 08/13/21 1254 08/13/21 1254   08/13/21 1115  sulfamethoxazole-trimethoprim (BACTRIM DS) 800-160 MG per  tablet 1 tablet  Status:  Discontinued        1 tablet Oral Every 12 hours 08/13/21 1110 08/13/21 1253   08/12/21 1430  piperacillin-tazobactam (ZOSYN) IVPB 3.375 g  Status:  Discontinued        3.375 g 12.5 mL/hr over 240 Minutes Intravenous Every 8 hours 08/12/21 1406 08/13/21 1254        I have personally reviewed the following labs and images: CBC: Recent Labs  Lab 08/08/21 0651 08/11/21 0405 08/12/21 0840 08/14/21 0624  WBC 5.4 10.2 17.7* 9.6  NEUTROABS  --   --   --  7.1  HGB 12.3 12.8 13.6 14.4  HCT 34.6* 37.9 39.5 42.1  MCV 93.5 93.6 95.4 92.7  PLT 261 268 286 281   BMP &GFR Recent Labs  Lab 08/12/21 0642 08/12/21 0911 08/12/21 1753 08/13/21 0617 08/13/21 1226 08/14/21 0624  NA  --  126* 128* 130* 131* 132*  K  --  3.4* 3.7 3.3* 3.7 3.7  CL  --  85* 86* 86* 85* 86*  CO2  --  31 29 35* 34* 35*  GLUCOSE  --  101* 121* 93 98 96  BUN  --  15 14 15 17 19   CREATININE  --  0.63 0.67 0.62 0.75 0.60  CALCIUM  --  8.6* 8.4* 8.3* 8.5* 8.4*  MG 2.1  --   --   --  1.9 1.8  PHOS 2.2*  --   --   --   --  4.2   Estimated Creatinine Clearance: 44.7 mL/min (by C-G formula based on SCr of 0.6 mg/dL). Liver & Pancreas: Recent Labs  Lab 08/12/21 0625 08/14/21 0624  AST 23  --   ALT 40  --   ALKPHOS 159*  --   BILITOT 1.1  --   PROT 8.2*  --   ALBUMIN 4.7 3.4*   No results for input(s): LIPASE, AMYLASE in the last 168 hours. Recent Labs  Lab 08/14/21 0624  AMMONIA 17   Diabetic: No results for input(s): HGBA1C in the last 72 hours. Recent Labs  Lab 08/10/21 2133  GLUCAP 107*   Cardiac Enzymes: No results for input(s): CKTOTAL, CKMB, CKMBINDEX, TROPONINI in the last 168 hours. No results for input(s): PROBNP in the last 8760 hours. Coagulation Profile: No results for input(s): INR, PROTIME in the last 168 hours. Thyroid Function Tests: Recent Labs    08/14/21 0624  TSH 2.653   Lipid Profile: Recent  Labs    08/13/21 0617  CHOL 128  HDL 69   LDLCALC 48  TRIG 56  CHOLHDL 1.9   Anemia Panel: No results for input(s): VITAMINB12, FOLATE, FERRITIN, TIBC, IRON, RETICCTPCT in the last 72 hours. Urine analysis:    Component Value Date/Time   COLORURINE YELLOW 07/09/2021 [place] 07/09/2021 0857   APPEARANCEUR Clear 05/28/2014 0855   LABSPEC 1.010 07/09/2021 0857   LABSPEC 1.013 05/28/2014 0855   PHURINE 5.5 07/09/2021 [place] 07/09/2021 0857   GLUCOSEU Negative 05/28/2014 0855   HGBUR NEGATIVE 07/09/2021 [place] 07/09/2021 0857   BILIRUBINUR Negative 05/28/2014 [place] 07/09/2021 [place] 07/09/2021 0857   NITRITE NEGATIVE 07/09/2021 0857   LEUKOCYTESUR NEGATIVE 07/09/2021 0857   LEUKOCYTESUR Negative 05/28/2014 0855   Sepsis Labs: Invalid input(s): PROCALCITONIN, [place]  Microbiology: Recent Results (from the past 240 hour(s))  Resp Panel by RT-PCR (Flu A&B, Covid) Nasopharyngeal Swab     Status: None   Collection Time: 08/07/21  4:13 AM   Specimen: Nasopharyngeal Swab; Nasopharyngeal(NP) swabs in vial transport medium  Result Value Ref Range Status   SARS Coronavirus 2 by RT PCR NEGATIVE NEGATIVE Final    Comment: (NOTE) SARS-CoV-2 target nucleic acids are NOT DETECTED.  The SARS-CoV-2 RNA is generally detectable in upper respiratory specimens during the acute phase of infection. The lowest concentration of SARS-CoV-2 viral copies this assay can detect is 138 copies/mL. A negative result does not preclude SARS-Cov-2 infection and should not be used as the sole basis for treatment or other patient management decisions. A negative result may occur with  improper specimen collection/handling, submission of specimen other than nasopharyngeal swab, presence of viral mutation(s) within the areas targeted by this assay, and inadequate number of viral copies(<138 copies/mL). A negative result must be combined with clinical  observations, patient history, and epidemiological information. The expected result is Negative.  Fact Sheet for Patients:  EntrepreneurPulse.com.au  Fact Sheet for Healthcare Providers:  IncredibleEmployment.be  This test is no t yet approved or cleared by the [place] FDA and  has been authorized for detection and/or diagnosis of SARS-CoV-2 by FDA under an Emergency Use Authorization (EUA). This EUA will remain  in effect (meaning this test can be used) for the duration of the COVID-19 declaration under Section 564(b)(1) of the Act, 21 U.S.C.section 360bbb-3(b)(1), unless the authorization is terminated  or revoked sooner.       Influenza A by PCR NEGATIVE NEGATIVE Final   Influenza B by PCR NEGATIVE NEGATIVE Final    Comment: (NOTE) The Xpert Xpress SARS-CoV-2/FLU/RSV plus assay is intended as an aid in the diagnosis of influenza from Nasopharyngeal swab specimens and should not be used as a sole basis for treatment. Nasal washings and aspirates are unacceptable for Xpert Xpress SARS-CoV-2/FLU/RSV testing.  Fact Sheet for Patients: EntrepreneurPulse.com.au  Fact Sheet for Healthcare Providers: IncredibleEmployment.be  This test is not yet approved or cleared by the [place] FDA and has been authorized for detection and/or diagnosis of SARS-CoV-2 by FDA under an Emergency Use Authorization (EUA). This EUA will remain in effect (meaning this test can be used) for the duration of the COVID-19 declaration under Section 564(b)(1) of the Act, 21 U.S.C. section 360bbb-3(b)(1), unless the authorization is terminated or revoked.  Performed at [facility], [address] [place], [place] [address]   Culture, blood (Routine X 2) w Reflex to ID Panel  Status: None   Collection Time: 08/07/21  4:36 AM   Specimen: BLOOD RIGHT HAND  Result Value Ref Range Status   Specimen Description  BLOOD RIGHT HAND  Final   Special Requests   Final    BOTTLES DRAWN AEROBIC AND ANAEROBIC Blood Culture results may not be optimal due to an inadequate volume of blood received in culture bottles   Culture   Final    NO GROWTH 5 DAYS Performed at [facility], [address] [place], [place] [address]    Report Status 08/12/2021 FINAL  Final  Culture, blood (Routine X 2) w Reflex to ID Panel     Status: None   Collection Time: 08/07/21  5:07 AM   Specimen: BLOOD RIGHT FOREARM  Result Value Ref Range Status   Specimen Description BLOOD RIGHT FOREARM  Final   Special Requests   Final    BOTTLES DRAWN AEROBIC AND ANAEROBIC Blood Culture adequate volume   Culture   Final    NO GROWTH 5 DAYS Performed at [facility], [address], [place], [place] [address]    Report Status 08/12/2021 FINAL  Final  Expectorated Sputum Assessment w Gram Stain, Rflx to Resp Cult     Status: None   Collection Time: 08/07/21  8:32 PM   Specimen: Sputum  Result Value Ref Range Status   Specimen Description SPUTUM  Final   Special Requests NONE  Final   Sputum evaluation   Final    THIS SPECIMEN IS ACCEPTABLE FOR SPUTUM CULTURE Performed at [facility], [address] [place], [place] [address]    Report Status 08/07/2021 FINAL  Final  Culture, Respiratory w Gram Stain     Status: None   Collection Time: 08/07/21  8:32 PM   Specimen: SPU  Result Value Ref Range Status   Specimen Description   Final    SPUTUM Performed at [facility], [address], [place], [place] [address]    Special Requests   Final    NONE Reflexed from [id number] Performed at [facility], [address] [place], [place] [address]    Gram Stain   Final    MODERATE WBC PRESENT,BOTH PMN AND MONONUCLEAR RARE YEAST FEW GRAM POSITIVE COCCI IN PAIRS FEW GRAM VARIABLE ROD    Culture   Final    FEW ACINETOBACTER BAUMANNII MODERATE DIPHTHEROIDS(CORYNEBACTERIUM  SPECIES) Standardized susceptibility testing for this organism is not available. Performed at [facility], [place] [address] [place], [place] [address]    Report Status 08/10/2021 FINAL  Final   Organism ID, Bacteria ACINETOBACTER BAUMANNII  Final      Susceptibility   Acinetobacter baumannii - MIC*    CEFTAZIDIME 4 SENSITIVE Sensitive     CIPROFLOXACIN >=4 RESISTANT Resistant     GENTAMICIN 2 SENSITIVE Sensitive     IMIPENEM <=0.25 SENSITIVE Sensitive     PIP/TAZO <=4 SENSITIVE Sensitive     TRIMETH/SULFA <=20 SENSITIVE Sensitive     AMPICILLIN/SULBACTAM <=2 SENSITIVE Sensitive     * FEW ACINETOBACTER BAUMANNII  Body fluid culture w Gram Stain     Status: None   Collection Time: 08/08/21  9:45 AM   Specimen: PATH Cytology Pleural fluid  Result Value Ref Range Status   Specimen Description   Final    PLEURAL Performed at [facility], [address], [place], [place] [address]    Special Requests   Final    PLEURAL Performed at [facility],  [place], [place] [address]    Gram Stain   Final    WBC PRESENT, PREDOMINANTLY MONONUCLEAR NO ORGANISMS SEEN CYTOSPIN SMEAR    Culture   Final    NO GROWTH 3 DAYS Performed at [facility], [place] [address], [place], [place] [address]    Report Status 08/11/2021 FINAL  Final  Resp Panel by RT-PCR (Flu A&B, Covid) Nasopharyngeal Swab     Status: None   Collection Time: 08/12/21  6:25 AM   Specimen: Nasopharyngeal Swab; Nasopharyngeal(NP) swabs in vial transport medium  Result Value Ref Range Status   SARS Coronavirus 2 by RT PCR NEGATIVE NEGATIVE Final    Comment: (NOTE) SARS-CoV-2 target nucleic acids are NOT DETECTED.  The SARS-CoV-2 RNA is generally detectable in upper respiratory specimens during the acute phase of infection. The lowest concentration of SARS-CoV-2 viral copies this assay can detect is 138 copies/mL. A negative result does not preclude SARS-Cov-2 infection and should  not be used as the sole basis for treatment or other patient management decisions. A negative result may occur with  improper specimen collection/handling, submission of specimen other than nasopharyngeal swab, presence of viral mutation(s) within the areas targeted by this assay, and inadequate number of viral copies(<138 copies/mL). A negative result must be combined with clinical observations, patient history, and epidemiological information. The expected result is Negative.  Fact Sheet for Patients:  EntrepreneurPulse.com.au  Fact Sheet for Healthcare Providers:  IncredibleEmployment.be  This test is no t yet approved or cleared by the [place] FDA and  has been authorized for detection and/or diagnosis of SARS-CoV-2 by FDA under an Emergency Use Authorization (EUA). This EUA will remain  in effect (meaning this test can be used) for the duration of the COVID-19 declaration under Section 564(b)(1) of the Act, 21 U.S.C.section 360bbb-3(b)(1), unless the authorization is terminated  or revoked sooner.       Influenza A by PCR NEGATIVE NEGATIVE Final   Influenza B by PCR NEGATIVE NEGATIVE Final    Comment: (NOTE) The Xpert Xpress SARS-CoV-2/FLU/RSV plus assay is intended as an aid in the diagnosis of influenza from Nasopharyngeal swab specimens and should not be used as a sole basis for treatment. Nasal washings and aspirates are unacceptable for Xpert Xpress SARS-CoV-2/FLU/RSV testing.  Fact Sheet for Patients: EntrepreneurPulse.com.au  Fact Sheet for Healthcare Providers: IncredibleEmployment.be  This test is not yet approved or cleared by the [place] FDA and has been authorized for detection and/or diagnosis of SARS-CoV-2 by FDA under an Emergency Use Authorization (EUA). This EUA will remain in effect (meaning this test can be used) for the duration of the COVID-19 declaration under  Section 564(b)(1) of the Act, 21 U.S.C. section 360bbb-3(b)(1), unless the authorization is terminated or revoked.  Performed at [facility], [address] [place], [place] [address]   Culture, blood (Routine X 2) w Reflex to ID Panel     Status: None (Preliminary result)   Collection Time: 08/13/21  7:31 PM   Specimen: BLOOD  Result Value Ref Range Status   Specimen Description BLOOD [facility]  Final   Special Requests   Final    BOTTLES DRAWN AEROBIC AND ANAEROBIC Blood Culture adequate volume   Culture   Final    NO GROWTH < 12 HOURS Performed at [facility], [address] [place], [place] [address]    Report Status PENDING  Incomplete  Culture, blood (Routine X 2) w Reflex to ID Panel  Status: None (Preliminary result)   Collection Time: 08/13/21  7:50 PM   Specimen: BLOOD  Result Value Ref Range Status   Specimen Description BLOOD [facility]  Final   Special Requests   Final    BOTTLES DRAWN AEROBIC AND ANAEROBIC Blood Culture adequate volume   Culture   Final    NO GROWTH < 12 HOURS Performed at [facility], [address] [place], [place] [address]    Report Status PENDING  Incomplete    Radiology Studies: No results found.  Glendy Barsanti T. [place]  If 7PM-7AM, please contact night-coverage [email or web address] 08/14/2021, 2:50 PM

## 2021-08-15 ENCOUNTER — Inpatient Hospital Stay: Payer: Medicare Other

## 2021-08-15 ENCOUNTER — Ambulatory Visit: Payer: Medicare Other | Admitting: Family

## 2021-08-15 DIAGNOSIS — J9621 Acute and chronic respiratory failure with hypoxia: Secondary | ICD-10-CM | POA: Diagnosis not present

## 2021-08-15 DIAGNOSIS — Z7189 Other specified counseling: Secondary | ICD-10-CM | POA: Diagnosis not present

## 2021-08-15 LAB — BLOOD GAS, ARTERIAL
Acid-Base Excess: 18.4 mmol/L — ABNORMAL HIGH (ref 0.0–2.0)
Bicarbonate: 43.7 mmol/L — ABNORMAL HIGH (ref 20.0–28.0)
FIO2: 0.32
O2 Saturation: 97.4 %
Patient temperature: 37
pCO2 arterial: 50 mmHg — ABNORMAL HIGH (ref 32.0–48.0)
pH, Arterial: 7.55 — ABNORMAL HIGH (ref 7.350–7.450)
pO2, Arterial: 83 mmHg (ref 83.0–108.0)

## 2021-08-15 LAB — CBC
HCT: 43 % (ref 36.0–46.0)
Hemoglobin: 15.3 g/dL — ABNORMAL HIGH (ref 12.0–15.0)
MCH: 33.2 pg (ref 26.0–34.0)
MCHC: 35.6 g/dL (ref 30.0–36.0)
MCV: 93.3 fL (ref 80.0–100.0)
Platelets: 266 10*3/uL (ref 150–400)
RBC: 4.61 MIL/uL (ref 3.87–5.11)
RDW: 15.2 % (ref 11.5–15.5)
WBC: 14 10*3/uL — ABNORMAL HIGH (ref 4.0–10.5)
nRBC: 0 % (ref 0.0–0.2)

## 2021-08-15 LAB — RENAL FUNCTION PANEL
Albumin: 3.8 g/dL (ref 3.5–5.0)
Anion gap: 18 — ABNORMAL HIGH (ref 5–15)
BUN: 29 mg/dL — ABNORMAL HIGH (ref 8–23)
CO2: 33 mmol/L — ABNORMAL HIGH (ref 22–32)
Calcium: 8.8 mg/dL — ABNORMAL LOW (ref 8.9–10.3)
Chloride: 83 mmol/L — ABNORMAL LOW (ref 98–111)
Creatinine, Ser: 0.78 mg/dL (ref 0.44–1.00)
GFR, Estimated: >60 mL/min (ref >60)
Glucose, Bld: 124 mg/dL — ABNORMAL HIGH (ref 70–99)
Phosphorus: 4 mg/dL (ref 2.5–4.6)
Potassium: 3.5 mmol/L (ref 3.5–5.1)
Sodium: 134 mmol/L — ABNORMAL LOW (ref 135–145)

## 2021-08-15 LAB — URINALYSIS, COMPLETE (UACMP) WITH MICROSCOPIC
Bacteria, UA: NONE SEEN
Bilirubin Urine: NEGATIVE
Glucose, UA: NEGATIVE mg/dL
Hgb urine dipstick: NEGATIVE
Ketones, ur: NEGATIVE mg/dL
Leukocytes,Ua: NEGATIVE
Nitrite: NEGATIVE
Protein, ur: NEGATIVE mg/dL
Specific Gravity, Urine: 1.008 (ref 1.005–1.030)
pH: 6 (ref 5.0–8.0)

## 2021-08-15 LAB — HEMOGLOBIN A1C
Hgb A1c MFr Bld: 5.4 % (ref 4.8–5.6)
Mean Plasma Glucose: 108 mg/dL

## 2021-08-15 LAB — MAGNESIUM: Magnesium: 2.2 mg/dL (ref 1.7–2.4)

## 2021-08-15 MED ORDER — ENSURE ENLIVE PO LIQD: 237.0000 mL | Freq: Three times a day (TID) | ORAL | Status: DC

## 2021-08-15 MED ORDER — GABAPENTIN 100 MG PO CAPS
100.0000 mg | ORAL_CAPSULE | Freq: Three times a day (TID) | ORAL | Status: DC
  Administered 2021-08-15 – 2021-08-20 (×14): 100 mg via ORAL
  Filled 2021-08-15 (×15): qty 1

## 2021-08-15 MED ORDER — CHLORHEXIDINE GLUCONATE CLOTH 2 % EX PADS
6.0000 | MEDICATED_PAD | Freq: Every day | CUTANEOUS | Status: DC
  Administered 2021-08-15 – 2021-08-20 (×6): 6 via TOPICAL

## 2021-08-15 MED ORDER — NEPRO/CARBSTEADY PO LIQD
237.0000 mL | Freq: Three times a day (TID) | ORAL | Status: DC
  Administered 2021-08-16 – 2021-08-20 (×11): 237 mL via ORAL

## 2021-08-15 MED ORDER — DILTIAZEM LOAD VIA INFUSION
10.0000 mg | Freq: Once | INTRAVENOUS | Status: AC
  Administered 2021-08-15: 10 mg via INTRAVENOUS
  Filled 2021-08-15: qty 10

## 2021-08-15 MED ORDER — METOPROLOL TARTRATE 5 MG/5ML IV SOLN
2.5000 mg | Freq: Once | INTRAVENOUS | Status: AC
  Administered 2021-08-15: 2.5 mg via INTRAVENOUS
  Filled 2021-08-15: qty 5

## 2021-08-15 MED ORDER — METOPROLOL TARTRATE 50 MG PO TABS
50.0000 mg | ORAL_TABLET | Freq: Two times a day (BID) | ORAL | Status: DC
  Administered 2021-08-15 – 2021-08-20 (×10): 50 mg via ORAL
  Filled 2021-08-15 (×11): qty 1

## 2021-08-15 MED ORDER — MORPHINE SULFATE (PF) 2 MG/ML IV SOLN
1.0000 mg | INTRAVENOUS | Status: DC | PRN
  Administered 2021-08-15 – 2021-08-19 (×4): 2 mg via INTRAVENOUS
  Filled 2021-08-15 (×4): qty 1

## 2021-08-15 MED ORDER — DILTIAZEM HCL-DEXTROSE 125-5 MG/125ML-% IV SOLN (PREMIX)
5.0000 mg/h | INTRAVENOUS | Status: DC
  Administered 2021-08-15 – 2021-08-17 (×3): 5 mg/h via INTRAVENOUS
  Filled 2021-08-15 (×3): qty 125

## 2021-08-15 NOTE — Progress Notes (Addendum)
PROGRESS NOTE  Felicia Sellers [facility]:[id number] DOB: April 23, 1941   PCP: [facility]  Patient is from: Home.  Reportedly independent for most ADLs at baseline.  DOA: 08/12/2021 LOS: 3  Chief complaints:  Chief Complaint  Patient presents with   Respiratory Distress    Pt in via EMS from pt's home for SOB. EMS report pt's SPO2 at home on 4l 91%. EMS report pt's family states they can't take care of her anymore. EMS place pt on 6l n/c 96%.     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 fib not on AC, stomach ulcer, CAD, diastolic CHF, lung cancer s/p right lobectomy, tobacco use disorder, alcohol abuse, ovarian mass, chronic hyponatremia, severe malnutrition, HTN, HLD and recurrent hospitalization with respiratory failure returning with shortness of breath and admitted with acute on chronic RF with hypoxia due to COPD and CHF exacerbation.  Initially required BiPAP.  Started on IV Zosyn, IV Lasix, steroid and breathing treatments.  Subjective: Seen and examined earlier this morning.  Complains of lower abdominal pain and inability to urinate. Tachypnic, not speaking in full sentences.   Objective: Vitals:   08/15/21 1059 08/15/21 1215 08/15/21 1512 08/15/21 1639  BP: 103/72 135/82  113/69  Pulse: 87 98  (!) 102  Resp: 17 18  18   Temp: 98.7 F (37.1 C) 97.6 F (36.4 C)  97.6 F (36.4 C)  TempSrc:      SpO2: 98% 100% 97% 99%  Weight:      Height:        Intake/Output Summary (Last 24 hours) at 08/15/2021 1706 Last data filed at 08/15/2021 1611 Gross per 24 hour  Intake 814.27 ml  Output 5309 ml  Net -4494.73 ml   Filed Weights   08/12/21 0641 08/14/21 0512 08/15/21 0253  Weight: 51.6 kg 52.2 kg 49.9 kg    Examination:  GENERAL: Frail looking elderly female. In mild respiratory distress HEENT: MMM.  Hard of hearing  NECK: Supple.  No apparent JVD.  RESP: rhonchi and rales throughout CVS:  RRR. Heart sounds normal.   ABD/GI/GU: BS+. Abd soft, mild tenderness suprapubically MSK/EXT:  Moves extremities. No apparent deformity. No edema.  SKIN: no apparent skin lesion or wound NEURO: Awake and alert.  PSYCH: Calm. Normal affect.     Procedures:  None  Microbiology summarized: [id number] and influenza PCR nonreactive. Sputum culture pending. Blood culture pending.  Assessment & Plan: Acute on chronic respiratory failure with hypoxia due to combination of COPD, CHF, A. fib, pleural effusion and underlying lung cancer.  Third hospitalization for the same issue in less than 5 weeks.  She was discharged on 10/16 on p.o. Augmentin, Bactrim and 4 L by [place].  Currently saturating at 99% on 4L. Worsening respirations this am. Patient appears to be deteriorating. CT that resulted this afternoon shows reaccumulation of left sided pleural effusion. Previous cultures/cytology neg - cont o2, abx, steroids - abg ordered, may need bipap - palliative consulted - will repeat diagnostic/therapeutic thoracentesis  Urinary retention Symptomatic. Retaining 1100 this morning, s/p I/o cath. Urinary retention persisted throughout the day, most recent pvr 700 ml - place foley   Acute on chronic diastolic CHF (congestive heart failure): TTE in 06/2021 with LVEF of 55 to 60% (from 70 to 75% in 2021) and moderate TVR. 07/09/2021 showed EF of 55 to 60%.  She had edema, JVD, rhonchi and rales on exam.  CXR with pulmonary edema and increased pleural effusion.  BNP elevated to 770.  Started on IV Lasix.  She had 2.1 L UOP/24 hours.   -Continue IV Lasix 40 mg twice daily -Discontinued amlodipine and lisinopril to allow room for diuresis. -Monitor fluid status, renal functions and electrolytes. -Sodium and fluid restrictions -REDs Vest reading   COPD exacerbation: Sputum culture grew Acinetobacter and moderate diphtheroid prior hospitalization.  She was discharged on Augmentin and Bactrim.  Remains rhonchorous on exam.  Has leukocytosis  but Pro-Cal negative.  Ongoing tobacco use.  -Discontinued Augmentin and Bactrim -Changed since Zosyn to IV cefepime and azithromycin -Continue IV Solu-Medrol and bronchodilators -Continue mucolytic's/antitussive/IS -OOB/PT/OT -Titrate oxygen for saturation above 90% -Follow sputum cultures  Persistent A. fib with RVR: RVR resolved.  Not on anticoagulation likely due to history of stomach ulcer.  On Toprol-XL at home. -Metoprolol 25 mg twice daily, increase to 50  Acute metabolic encephalopathy: Resolved.   -Reorientation delirium precautions. -Minimize or avoid sedating medications  Dysphagia: Likely in the setting of encephalopathy. -Continue dysphagia 3 diet -SLP eval   Sepsis ruled out.    Essential hypertension: Normotensive. -IV Lasix and metoprolol as above -Discontinued amlodipine and lisinopril   Tobacco use disorder: Reportedly smokes about a pack a day. -Cessation counseling when able to comprehend. -Continue nicotine patch  Alcohol abuse-Per recent discharge summary, sober over the last 1 month. Has had intermittent ativan here but no clear signs of withdrawal. -Continue CIWA, multivitamin, folic acid and thiamine   Personal history of lung cancer -s/p of right lobectomy, 1/3 of right lung   Chronic hyponatremia: Improved.  Could be due to CHF and Bactrim. -Continue IV Lasix -Fluid and sodium restriction   Elevated troponin and history of CAD: Troponin is minimally elevated 19 > 23 > 18.  Likely demand ischemia -Cardiac meds as above -Continue home statin.  Hypokalemia: K 3.7.  Mg 1.8. -P.o. KCl 40x1 -IV magnesium sulfate 2 g x 1  Physical deconditioning -PT/OT recommended SNF.   Goal of care counseling-appropriately DNR/DNI.  A lot of comorbidity.  Poor long-term prognosis. I am concerned patient is nearing death. -Palliative medicine consulted on admission    Severe malnutrition: As evidenced by low BMI and significant muscle mass and subcu fat  loss. Body mass index is 19.5 kg/m. Nutrition Problem: Severe Malnutrition Etiology: chronic illness (COPD, CHF, lung cancer, etoh abuse)-Consult dietitian Signs/Symptoms: severe fat depletion, severe muscle depletion     DVT prophylaxis:  heparin injection 5,000 Units Start: 08/13/21 1400  Code Status: DNR/DNI Family Communication: nephew who is next of kin updated today on patient's tenuous status Level of care: Med-Surg Status is: Inpatient  Remains inpatient appropriate because: IV medication and need for palliative medicine safe disposition/SNF    Consultants:  Palliative medicine   Sch Meds:  Scheduled Meds:  atorvastatin  40 mg Oral QHS   calcium carbonate  1 tablet Oral BID WC   Chlorhexidine Gluconate Cloth  6 each Topical Daily   cholecalciferol  1,000 Units Oral Daily   docusate sodium  100 mg Oral Daily   feeding supplement (NEPRO CARB STEADY)  237 mL Oral TID BM   folic acid  1 mg Oral Daily   furosemide  40 mg Intravenous Q12H   gabapentin  100 mg Oral TID   heparin  5,000 Units Subcutaneous Q8H   ipratropium-albuterol  3 mL Nebulization Q6H   LORazepam  0-4 mg Intravenous Q12H   methylPREDNISolone (SOLU-MEDROL) injection  40 mg Intravenous Q12H   metoprolol tartrate  50 mg Oral BID  multivitamin with minerals  1 tablet Oral Daily   nicotine  21 mg Transdermal Daily   thiamine  100 mg Oral Daily   Or   thiamine  100 mg Intravenous Daily   Continuous Infusions:  sodium chloride Stopped (08/13/21 1517)   ceFEPime (MAXIPIME) IV Stopped (08/15/21 0952)   PRN Meds:.sodium chloride, acetaminophen, albuterol, dextromethorphan-guaiFENesin, diclofenac Sodium, loperamide, ondansetron (ZOFRAN) IV  Antimicrobials: Anti-infectives (From admission, onward)    Start     Dose/Rate Route Frequency Ordered Stop   08/13/21 1400  amoxicillin-clavulanate (AUGMENTIN) 500-125 MG per tablet 500 mg  Status:  Discontinued        1 tablet Oral Every 8 hours 08/13/21 1110  08/13/21 1233   08/13/21 1400  azithromycin (ZITHROMAX) 500 mg in sodium chloride 0.9 % 250 mL IVPB        500 mg 250 mL/hr over 60 Minutes Intravenous Every 24 hours 08/13/21 1254 08/15/21 1350   08/13/21 1345  ceFEPIme (MAXIPIME) 2 g in sodium chloride 0.9 % 100 mL IVPB        2 g 200 mL/hr over 30 Minutes Intravenous Every 12 hours 08/13/21 1330     08/13/21 1300  metroNIDAZOLE (FLAGYL) IVPB 500 mg  Status:  Discontinued        500 mg 100 mL/hr over 60 Minutes Intravenous Every 12 hours 08/13/21 1254 08/13/21 1254   08/13/21 1115  sulfamethoxazole-trimethoprim (BACTRIM DS) 800-160 MG per tablet 1 tablet  Status:  Discontinued        1 tablet Oral Every 12 hours 08/13/21 1110 08/13/21 1253   08/12/21 1430  piperacillin-tazobactam (ZOSYN) IVPB 3.375 g  Status:  Discontinued        3.375 g 12.5 mL/hr over 240 Minutes Intravenous Every 8 hours 08/12/21 1406 08/13/21 1254        I have personally reviewed the following labs and images: CBC: Recent Labs  Lab 08/11/21 0405 08/12/21 0840 08/14/21 0624 08/15/21 0444  WBC 10.2 17.7* 9.6 14.0*  NEUTROABS  --   --  7.1  --   HGB 12.8 13.6 14.4 15.3*  HCT 37.9 39.5 42.1 43.0  MCV 93.6 95.4 92.7 93.3  PLT 268 286 281 266   BMP &GFR Recent Labs  Lab 08/12/21 0642 08/12/21 0911 08/12/21 1753 08/13/21 0617 08/13/21 1226 08/14/21 0624 08/15/21 0444  NA  --    < > 128* 130* 131* 132* 134*  K  --    < > 3.7 3.3* 3.7 3.7 3.5  CL  --    < > 86* 86* 85* 86* 83*  CO2  --    < > 29 35* 34* 35* 33*  GLUCOSE  --    < > 121* 93 98 96 124*  BUN  --    < > 14 15 17 19  29*  CREATININE  --    < > 0.67 0.62 0.75 0.60 0.78  CALCIUM  --    < > 8.4* 8.3* 8.5* 8.4* 8.8*  MG 2.1  --   --   --  1.9 1.8 2.2  PHOS 2.2*  --   --   --   --  4.2 4.0   < > = values in this interval not displayed.   Estimated Creatinine Clearance: 42.7 mL/min (by C-G formula based on SCr of 0.78 mg/dL). Liver & Pancreas: Recent Labs  Lab 08/12/21 0625  08/14/21 0624 08/15/21 0444  AST 23  --   --   ALT 40  --   --  ALKPHOS 159*  --   --   BILITOT 1.1  --   --   PROT 8.2*  --   --   ALBUMIN 4.7 3.4* 3.8   No results for input(s): LIPASE, AMYLASE in the last 168 hours. Recent Labs  Lab 08/14/21 0624  AMMONIA 17   Diabetic: Recent Labs    08/12/21 1753  HGBA1C 5.4   Recent Labs  Lab 08/10/21 2133  GLUCAP 107*   Cardiac Enzymes: No results for input(s): CKTOTAL, CKMB, CKMBINDEX, TROPONINI in the last 168 hours. No results for input(s): PROBNP in the last 8760 hours. Coagulation Profile: No results for input(s): INR, PROTIME in the last 168 hours. Thyroid Function Tests: Recent Labs    08/14/21 0624  TSH 2.653   Lipid Profile: Recent Labs    08/13/21 0617  CHOL 128  HDL 69  LDLCALC 48  TRIG 56  CHOLHDL 1.9   Anemia Panel: No results for input(s): VITAMINB12, FOLATE, FERRITIN, TIBC, IRON, RETICCTPCT in the last 72 hours. Urine analysis:    Component Value Date/Time   COLORURINE STRAW (A) 08/15/2021 1055   APPEARANCEUR CLEAR (A) 08/15/2021 1055   APPEARANCEUR Clear 05/28/2014 0855   LABSPEC 1.008 08/15/2021 1055   LABSPEC 1.013 05/28/2014 0855   PHURINE 6.0 08/15/2021 1055   GLUCOSEU NEGATIVE 08/15/2021 1055   GLUCOSEU Negative 05/28/2014 0855   HGBUR NEGATIVE 08/15/2021 1055   BILIRUBINUR NEGATIVE 08/15/2021 1055   BILIRUBINUR Negative 05/28/2014 0855   KETONESUR NEGATIVE 08/15/2021 1055   PROTEINUR NEGATIVE 08/15/2021 1055   NITRITE NEGATIVE 08/15/2021 1055   LEUKOCYTESUR NEGATIVE 08/15/2021 1055   LEUKOCYTESUR Negative 05/28/2014 0855   Sepsis Labs: Invalid input(s): PROCALCITONIN, [place]  Microbiology: Recent Results (from the past 240 hour(s))  Resp Panel by RT-PCR (Flu A&B, Covid) Nasopharyngeal Swab     Status: None   Collection Time: 08/07/21  4:13 AM   Specimen: Nasopharyngeal Swab; Nasopharyngeal(NP) swabs in vial transport medium  Result Value Ref Range Status   SARS  Coronavirus 2 by RT PCR NEGATIVE NEGATIVE Final    Comment: (NOTE) SARS-CoV-2 target nucleic acids are NOT DETECTED.  The SARS-CoV-2 RNA is generally detectable in upper respiratory specimens during the acute phase of infection. The lowest concentration of SARS-CoV-2 viral copies this assay can detect is 138 copies/mL. A negative result does not preclude SARS-Cov-2 infection and should not be used as the sole basis for treatment or other patient management decisions. A negative result may occur with  improper specimen collection/handling, submission of specimen other than nasopharyngeal swab, presence of viral mutation(s) within the areas targeted by this assay, and inadequate number of viral copies(<138 copies/mL). A negative result must be combined with clinical observations, patient history, and epidemiological information. The expected result is Negative.  Fact Sheet for Patients:  EntrepreneurPulse.com.au  Fact Sheet for Healthcare Providers:  IncredibleEmployment.be  This test is no t yet approved or cleared by the [place] FDA and  has been authorized for detection and/or diagnosis of SARS-CoV-2 by FDA under an Emergency Use Authorization (EUA). This EUA will remain  in effect (meaning this test can be used) for the duration of the COVID-19 declaration under Section 564(b)(1) of the Act, 21 U.S.C.section 360bbb-3(b)(1), unless the authorization is terminated  or revoked sooner.       Influenza A by PCR NEGATIVE NEGATIVE Final   Influenza B by PCR NEGATIVE NEGATIVE Final    Comment: (NOTE) The Xpert Xpress SARS-CoV-2/FLU/RSV plus assay is intended as an aid in the diagnosis  of influenza from Nasopharyngeal swab specimens and should not be used as a sole basis for treatment. Nasal washings and aspirates are unacceptable for Xpert Xpress SARS-CoV-2/FLU/RSV testing.  Fact Sheet for  Patients: EntrepreneurPulse.com.au  Fact Sheet for Healthcare Providers: IncredibleEmployment.be  This test is not yet approved or cleared by the [place] FDA and has been authorized for detection and/or diagnosis of SARS-CoV-2 by FDA under an Emergency Use Authorization (EUA). This EUA will remain in effect (meaning this test can be used) for the duration of the COVID-19 declaration under Section 564(b)(1) of the Act, 21 U.S.C. section 360bbb-3(b)(1), unless the authorization is terminated or revoked.  Performed at [facility], [address] [place], [place] [address]   Culture, blood (Routine X 2) w Reflex to ID Panel     Status: None   Collection Time: 08/07/21  4:36 AM   Specimen: BLOOD RIGHT HAND  Result Value Ref Range Status   Specimen Description BLOOD RIGHT HAND  Final   Special Requests   Final    BOTTLES DRAWN AEROBIC AND ANAEROBIC Blood Culture results may not be optimal due to an inadequate volume of blood received in culture bottles   Culture   Final    NO GROWTH 5 DAYS Performed at [facility], [address] [place], [place] [address]    Report Status 08/12/2021 FINAL  Final  Culture, blood (Routine X 2) w Reflex to ID Panel     Status: None   Collection Time: 08/07/21  5:07 AM   Specimen: BLOOD RIGHT FOREARM  Result Value Ref Range Status   Specimen Description BLOOD RIGHT FOREARM  Final   Special Requests   Final    BOTTLES DRAWN AEROBIC AND ANAEROBIC Blood Culture adequate volume   Culture   Final    NO GROWTH 5 DAYS Performed at [facility], [address], [place], [place] [address]    Report Status 08/12/2021 FINAL  Final  Expectorated Sputum Assessment w Gram Stain, Rflx to Resp Cult     Status: None   Collection Time: 08/07/21  8:32 PM   Specimen: Sputum  Result Value Ref Range Status   Specimen Description SPUTUM  Final   Special Requests NONE  Final   Sputum evaluation    Final    THIS SPECIMEN IS ACCEPTABLE FOR SPUTUM CULTURE Performed at [facility], [address], [place], [place] [address]    Report Status 08/07/2021 FINAL  Final  Culture, Respiratory w Gram Stain     Status: None   Collection Time: 08/07/21  8:32 PM   Specimen: SPU  Result Value Ref Range Status   Specimen Description   Final    SPUTUM Performed at [facility], [address] [place], [place] [address]    Special Requests   Final    NONE Reflexed from [id number] Performed at [facility], [address] [place], [place] [address]    Gram Stain   Final    MODERATE WBC PRESENT,BOTH PMN AND MONONUCLEAR RARE YEAST FEW GRAM POSITIVE COCCI IN PAIRS FEW GRAM VARIABLE ROD    Culture   Final    FEW ACINETOBACTER BAUMANNII MODERATE DIPHTHEROIDS(CORYNEBACTERIUM SPECIES) Standardized susceptibility testing for this organism is not available. Performed at [facility], [place] [address], [place], [place] [address]    Report Status 08/10/2021 FINAL  Final   Organism ID, Bacteria ACINETOBACTER BAUMANNII  Final      Susceptibility   Acinetobacter baumannii -  MIC*    CEFTAZIDIME 4 SENSITIVE Sensitive     CIPROFLOXACIN >=4 RESISTANT Resistant     GENTAMICIN 2 SENSITIVE Sensitive     IMIPENEM <=0.25 SENSITIVE Sensitive     PIP/TAZO <=4 SENSITIVE Sensitive     TRIMETH/SULFA <=20 SENSITIVE Sensitive     AMPICILLIN/SULBACTAM <=2 SENSITIVE Sensitive     * FEW ACINETOBACTER BAUMANNII  Body fluid culture w Gram Stain     Status: None   Collection Time: 08/08/21  9:45 AM   Specimen: PATH Cytology Pleural fluid  Result Value Ref Range Status   Specimen Description   Final    PLEURAL Performed at [facility], [address], [place], [place] [address]    Special Requests   Final    PLEURAL Performed at [facility], [address] [place], [place] [address]    Gram Stain   Final    WBC PRESENT, PREDOMINANTLY MONONUCLEAR NO  ORGANISMS SEEN CYTOSPIN SMEAR    Culture   Final    NO GROWTH 3 DAYS Performed at [facility], [place] [address] [place], [place] [address]    Report Status 08/11/2021 FINAL  Final  Resp Panel by RT-PCR (Flu A&B, Covid) Nasopharyngeal Swab     Status: None   Collection Time: 08/12/21  6:25 AM   Specimen: Nasopharyngeal Swab; Nasopharyngeal(NP) swabs in vial transport medium  Result Value Ref Range Status   SARS Coronavirus 2 by RT PCR NEGATIVE NEGATIVE Final    Comment: (NOTE) SARS-CoV-2 target nucleic acids are NOT DETECTED.  The SARS-CoV-2 RNA is generally detectable in upper respiratory specimens during the acute phase of infection. The lowest concentration of SARS-CoV-2 viral copies this assay can detect is 138 copies/mL. A negative result does not preclude SARS-Cov-2 infection and should not be used as the sole basis for treatment or other patient management decisions. A negative result may occur with  improper specimen collection/handling, submission of specimen other than nasopharyngeal swab, presence of viral mutation(s) within the areas targeted by this assay, and inadequate number of viral copies(<138 copies/mL). A negative result must be combined with clinical observations, patient history, and epidemiological information. The expected result is Negative.  Fact Sheet for Patients:  EntrepreneurPulse.com.au  Fact Sheet for Healthcare Providers:  IncredibleEmployment.be  This test is no t yet approved or cleared by the [place] FDA and  has been authorized for detection and/or diagnosis of SARS-CoV-2 by FDA under an Emergency Use Authorization (EUA). This EUA will remain  in effect (meaning this test can be used) for the duration of the COVID-19 declaration under Section 564(b)(1) of the Act, 21 U.S.C.section 360bbb-3(b)(1), unless the authorization is terminated  or revoked sooner.       Influenza A by PCR NEGATIVE  NEGATIVE Final   Influenza B by PCR NEGATIVE NEGATIVE Final    Comment: (NOTE) The Xpert Xpress SARS-CoV-2/FLU/RSV plus assay is intended as an aid in the diagnosis of influenza from Nasopharyngeal swab specimens and should not be used as a sole basis for treatment. Nasal washings and aspirates are unacceptable for Xpert Xpress SARS-CoV-2/FLU/RSV testing.  Fact Sheet for Patients: EntrepreneurPulse.com.au  Fact Sheet for Healthcare Providers: IncredibleEmployment.be  This test is not yet approved or cleared by the [place] FDA and has been authorized for detection and/or diagnosis of SARS-CoV-2 by FDA under an Emergency Use Authorization (EUA). This EUA will remain in effect (meaning this test can be used) for the duration of the COVID-19 declaration under Section 564(b)(1) of  the Act, 21 U.S.C. section 360bbb-3(b)(1), unless the authorization is terminated or revoked.  Performed at [facility], [address] [place], [place] [address]   Culture, blood (Routine X 2) w Reflex to ID Panel     Status: None (Preliminary result)   Collection Time: 08/13/21  7:31 PM   Specimen: BLOOD  Result Value Ref Range Status   Specimen Description BLOOD [facility]  Final   Special Requests   Final    BOTTLES DRAWN AEROBIC AND ANAEROBIC Blood Culture adequate volume   Culture   Final    NO GROWTH 2 DAYS Performed at [facility], [address], [place], [place] [address]    Report Status PENDING  Incomplete  Culture, blood (Routine X 2) w Reflex to ID Panel     Status: None (Preliminary result)   Collection Time: 08/13/21  7:50 PM   Specimen: BLOOD  Result Value Ref Range Status   Specimen Description BLOOD [facility]  Final   Special Requests   Final    BOTTLES DRAWN AEROBIC AND ANAEROBIC Blood Culture adequate volume   Culture   Final    NO GROWTH 2 DAYS Performed at [facility], [address] [place], [place] [address]     Report Status PENDING  Incomplete    Radiology Studies: CT CHEST WO CONTRAST  Result Date: 08/15/2021 CLINICAL DATA:  Respiratory failure. EXAM: CT CHEST WITHOUT CONTRAST TECHNIQUE: Multidetector CT imaging of the chest was performed following the standard protocol without IV contrast. COMPARISON:  August 07, 2021. FINDINGS: Cardiovascular: Mild cardiomegaly is noted without pericardial effusion. Coronary artery calcifications are again noted. Atherosclerosis of thoracic aorta is noted. 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 Mediastinum/Nodes: No enlarged mediastinal or axillary lymph nodes. Thyroid gland, trachea, and esophagus demonstrate no significant findings. Lungs/Pleura: No pneumothorax is noted. Moderate size left pleural effusion is noted with associated atelectasis of left lower lobe. Minimal right basilar subsegmental atelectasis is noted. Upper Abdomen: No acute abnormality. Musculoskeletal: No chest wall mass or suspicious bone lesions identified. IMPRESSION: Moderate size left pleural effusion is noted with associated atelectasis of the left lower lobe. 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 Coronary artery calcifications are noted suggesting coronary artery disease. Minimal right posterior basilar subsegmental atelectasis is noted. Aortic Atherosclerosis (ICD10-I70.0) and Emphysema (ICD10-J43.9). Electronically Signed   By: Marijo Conception M.D.   On: 08/15/2021 15:00    Laurey Arrow, MD [facility]  If 7PM-7AM, please contact night-coverage [email or web address] 08/15/2021, 5:06 PM

## 2021-08-15 NOTE — Evaluation (Signed)
Clinical/Bedside Swallow Evaluation Patient Details  Name: Felicia Sellers MRN: [id number] Date of Birth: [birth year]  Today's Date: 08/15/2021 Time: SLP Start Time (ACUTE ONLY): 30 SLP Stop Time (ACUTE ONLY): 1251 SLP Time Calculation (min) (ACUTE ONLY): 36 min  Past Medical History:  Past Medical History:  Diagnosis Date   Aortic aneurysm ([facility]) 08/08/2021   Aortic atherosclerosis ([place]) 08/08/2021   Cancer ([place])    right lung removed - lung cancer 1/3 lung removed    CHF (congestive heart failure) (HCC)    Chronic diastolic CHF (congestive heart failure) ([place]) 08/08/2021   Chronic hyponatremia 11/04/2015   Overview:  Normal serum oslmolality   COPD (chronic obstructive pulmonary disease) (HCC)    Coronary artery disease    Emphysema ([place]) 08/08/2021   History of stomach ulcers    Hypertension    Lung mass 08/08/2021   Ovarian mass 08/08/2021   Pneumonia    Unspecified atrial fibrillation ([place]) 08/08/2021   Past Surgical History:  Past Surgical History:  Procedure Laterality Date   LUNG REMOVAL, PARTIAL Left    HPI:  Per admitting H & P "Felicia Sellers is a 80 y.o. female with medical history significant of COPD on 4 L oxygen, hypertension, hyperlipidemia, atrial fibrillation on anticoagulants, stomach ulcer, CAD, CHF, lung cancer (s/p of right lobectomy, 1/3 of right lung), tobacco abuse, alcohol abuse, who presents with shortness breath.     Patient was hospitalized from 10/16 to 10/20 due to COPD exacerbation.  Also had large left pleural effusion and underwent thoracentesis. Sputum cx was positive for acinetobacter. Pt was discharged on Augmentin and bactrim.  Patient comes back today with worsening shortness of breath.  Patient was found to have severe respiratory distress, using accessory muscle for breathing, cannot speak in full sentence.  BiPAP started in ED. Patient does not have chest pain, fever or chills.  No nausea vomiting, diarrhea or abdominal pain.  No symptoms of UTI.   Patient has bilateral lower leg edema.   "    Assessment / Plan / Recommendation  Clinical Impression  Pt presents with moderate risk for aspiration secondary to deconditioning and shortness of breath. Pt noted to have congested cough throughout swallow eval making it difficult to determine when it was related to Po's. Noted moderate oral transit delay with solids, mild oral residue and fatigue with mastication. Pt tolerated applesauce boluses without difficulty. Congested delayed cough after two of 5 sips of water. Pt tolerated nectar thick liquids well. Rec Dys 2 chopped diet with nectar thick liquids to decrease risk of aspiration. After oal care, Pt may have ice chips when requested. ST to follow and reassess with thin liquids as appropriate. If Pt status changes to comfort care, water may be added back to diet with strict aspiration precautions. Prognosis fair. SLP Visit Diagnosis: Dysphagia, unspecified (R13.10)    Aspiration Risk  Moderate aspiration risk    Diet Recommendation Dysphagia 2 (Fine chop);Nectar-thick liquid   Medication Administration: Whole meds with puree Compensations: Small sips/bites;Slow rate Postural Changes: Seated upright at 90 degrees;Remain upright for at least 30 minutes after po intake    Other  Recommendations      Recommendations for follow up therapy are one component of a multi-disciplinary discharge planning process, led by the attending physician.  Recommendations may be updated based on patient status, additional functional criteria and insurance authorization.  Follow up Recommendations Skilled Nursing facility      Frequency and Duration min 2x/week  1 week  Prognosis Prognosis for Safe Diet Advancement: Guarded      Swallow Study   General Date of Onset: 08/12/21 HPI: Per admitting H & P "Felicia Sellers is a 80 y.o. female with medical history significant of COPD on 4 L oxygen, hypertension, hyperlipidemia, atrial fibrillation on  anticoagulants, stomach ulcer, CAD, CHF, lung cancer (s/p of right lobectomy, 1/3 of right lung), tobacco abuse, alcohol abuse, who presents with shortness breath.     Patient was hospitalized from 10/16 to 10/20 due to COPD exacerbation.  Also had large left pleural effusion and underwent thoracentesis. Sputum cx was positive for acinetobacter. Pt was discharged on Augmentin and bactrim.  Patient comes back today with worsening shortness of breath.  Patient was found to have severe respiratory distress, using accessory muscle for breathing, cannot speak in full sentence.  BiPAP started in ED. Patient does not have chest pain, fever or chills.  No nausea vomiting, diarrhea or abdominal pain.  No symptoms of UTI.  Patient has bilateral lower leg edema.   " Type of Study: Bedside Swallow Evaluation Diet Prior to this Study: Dysphagia 3 (soft) Temperature Spikes Noted: No Respiratory Status: Nasal cannula History of Recent Intubation: No Behavior/Cognition: Alert;Cooperative;Pleasant mood Oral Cavity Assessment: Within Functional Limits Oral Care Completed by SLP: No Oral Cavity - Dentition: Dentures, top;Dentures, bottom Vision: Impaired for self-feeding Self-Feeding Abilities: Needs set up Patient Positioning: Upright in bed Baseline Vocal Quality: Breathy;Hoarse;Low vocal intensity Volitional Cough: Congested    Oral/Motor/Sensory Function Overall Oral Motor/Sensory Function: Mild impairment   Ice Chips Ice chips: Within functional limits Presentation: Spoon   Thin Liquid Thin Liquid: Impaired Presentation: Cup;Spoon Pharyngeal  Phase Impairments: Cough - Delayed    Nectar Thick Nectar Thick Liquid: Within functional limits Presentation: Cup   Honey Thick Honey Thick Liquid: Not tested   Puree Puree: Within functional limits Presentation: Spoon   Solid     Solid: Impaired Presentation: Self Fed Oral Phase Impairments: Impaired mastication Oral Phase Functional Implications:  Prolonged oral transit;Oral residue      Lucila [place] 08/15/2021,12:52 PM

## 2021-08-15 NOTE — Care Management Important Message (Signed)
Important Message  Patient Details  Name: Felicia Sellers MRN: [id number] Date of Birth: [birth year]   Medicare Important Message Given:  Yes     Dannette Barbara 08/15/2021, 4:08 PM

## 2021-08-15 NOTE — Progress Notes (Signed)
   08/15/21 0927  Assess: MEWS Score  Temp (!) 97.4 F (36.3 C)  BP (!) 146/96  Pulse Rate (!) 131  Level of Consciousness Alert  SpO2 94 %  O2 Device Nasal Cannula  O2 Flow Rate (L/min) 4 L/min  Assess: MEWS Score  MEWS Temp 0  MEWS Systolic 0  MEWS Pulse 3  MEWS RR 0  MEWS LOC 0  MEWS Score 3  MEWS Score Color Yellow  Assess: if the MEWS score is Yellow or Red  Were vital signs taken at a resting state? Yes  Focused Assessment No change from prior assessment  Does the patient meet 2 or more of the SIRS criteria? No  MEWS guidelines implemented *See Row Information* Yes  Treat  MEWS Interventions Administered scheduled meds/treatments  Pain Scale 0-10  Pain Score 0  Take Vital Signs  Increase Vital Sign Frequency  Yellow: Q 2hr X 2 then Q 4hr X 2, if remains yellow, continue Q 4hrs  Escalate  MEWS: Escalate Yellow: discuss with charge nurse/RN and consider discussing with provider and RRT  Notify: Charge Nurse/RN  Name of Charge Nurse/RN Notified Linard Millers  Date Charge Nurse/RN Notified 08/15/21  Time Charge Nurse/RN Notified 1000  Notify: Provider  Provider Name/Title Wouk  Date Provider Notified 08/15/21  Time Provider Notified 1015  Notification Type Page  Notification Reason Change in status  Provider response At bedside;See new orders  Date of Provider Response 08/15/21  Time of Provider Response 1015  Document  Patient Outcome Stabilized after interventions  Progress note created (see row info) Yes  Assess: SIRS CRITERIA  SIRS Temperature  0  SIRS Pulse 1  SIRS Respirations  0  SIRS WBC 0  SIRS Score Sum  1

## 2021-08-15 NOTE — Progress Notes (Addendum)
Initial Nutrition Assessment  DOCUMENTATION CODES:   Severe malnutrition in context of chronic illness  INTERVENTION:   Nepro Shake po TID, each supplement provides 425 kcal and 19 grams protein  Magic cup TID with meals, each supplement provides 290 kcal and 9 grams of protein  MVI, folic acid and thiamine po daily  Pt at high refeed risk; recommend monitor potassium, magnesium and phosphorus labs daily until stable  NUTRITION DIAGNOSIS:   Severe Malnutrition related to chronic illness (COPD, CHF, lung cancer, etoh abuse) as evidenced by severe fat depletion, severe muscle depletion.  GOAL:   Patient will meet greater than or equal to 90% of their needs  MONITOR:   PO intake, Supplement acceptance, Labs, Weight trends, Skin, I & O's  REASON FOR ASSESSMENT:   Consult Assessment of nutrition requirement/status  ASSESSMENT:   80 year old female with h/o COPD on 4 L, A. fib not on AC, stomach ulcer, CAD, diastolic CHF, lung cancer s/p right lobectomy, tobacco use disorder, alcohol abuse, ovarian mass, chronic hyponatremia, severe malnutrition, HTN, HLD and recurrent hospitalization with respiratory failure who is returning with shortness of breath and admitted with acute on chronic RF with hypoxia due to COPD and CHF exacerbation.  Pt is well known to this RD from a recent previous admit. Pt reports fair appetite and oral intake at baseline but reports that she has not been eating well recently r/t SOB. Pt reports that her throat gets dry and that she has difficulty chewing and swallowing certain foods. Pt documented to be eating only sips and bites of meals. RD suspects pt with poor oral intake at baseline r/t etoh abuse. RD discussed with pt the importance of adequate nutrition needed to preserve lean muscle. Pt drinking vanilla and strawberry Ensure during her last admission; RD will order Nepro as pt on nectar thick liquids. Pt also loves ice cream. RD will add Magic Cups to  meal trays. Pt is likely at high refeed risk. Per chart, pt appears fairly weight stable at baseline.   Medications reviewed and include: D3, colace, lovenox, folic acid, lasix, heparin, solu-medrol, MVI, nicotine, thiamine, cefepime   Labs reviewed: Na 134(L), K 3.5 wnl, BUN 29(H), P 4.0 wnl, Mg 2.2 wnl Wbc- 14.0(H)  NUTRITION - FOCUSED PHYSICAL EXAM:  Flowsheet Row Most Recent Value  Orbital Region Moderate depletion  Upper Arm Region Severe depletion  Thoracic and Lumbar Region Severe depletion  Buccal Region Moderate depletion  Temple Region Moderate depletion  Clavicle Bone Region Severe depletion  Clavicle and Acromion Bone Region Severe depletion  Scapular Bone Region Severe depletion  Dorsal Hand Severe depletion  Patellar Region Severe depletion  Anterior Thigh Region Severe depletion  Posterior Calf Region Severe depletion  Edema (RD Assessment) Mild  Hair Reviewed  Eyes Reviewed  Mouth Reviewed  Skin Reviewed  Nails Reviewed   Diet Order:   Diet Order             DIET DYS 2 Room service appropriate? Yes; Fluid consistency: Nectar Thick  Diet effective now                  EDUCATION NEEDS:   No education needs have been identified at this time  Skin:  Skin Assessment: Reviewed RN Assessment (ecchymosis)  Last BM:  pta  Height:   Ht Readings from Last 1 Encounters:  08/12/21 5\' 3"  (1.6 m)    Weight:   Wt Readings from Last 1 Encounters:  08/15/21 49.9 kg    Ideal  Body Weight:  52.3 kg  BMI:  Body mass index is 19.5 kg/m.  Estimated Nutritional Needs:   Kcal:  1400-1600kcal/day  Protein:  70-80g/day  Fluid:  1.3-1.5L/day  Koleen Distance MS, RD, LDN Please refer to [facility] for RD and/or RD on-call/weekend/after hours pager

## 2021-08-15 NOTE — Consult Note (Addendum)
Consultation Note Date: 08/15/2021   Patient Name: Felicia Sellers  DOB: 01-27-1941  MRN: [id number]  Age / Sex: 80 y.o., female  PCP: [facility] Referring Physician: Gwynne Edinger, MD  Reason for Consultation: Establishing goals of care  HPI/Patient Profile: 80 year old F with PMH of COPD/chronic RF on 4 L, A. fib not on AC, stomach ulcer, CAD, diastolic CHF, lung cancer s/p right lobectomy, tobacco use disorder, alcohol abuse, ovarian mass, chronic hyponatremia, severe malnutrition, HTN, HLD and recurrent hospitalization with respiratory failure returning with shortness of breath and admitted with acute on chronic RF with hypoxia due to COPD and CHF exacerbation.  Initially required BiPAP.  Started on IV Zosyn, IV Lasix, steroid and breathing treatments.  Clinical Assessment and Goals of Care: Patient is resting in bed. She is thin, frail, and quiet. She is picking at her lunch, not really eating any of it. She has audible congestion while sitting beside her.    She states she was home for a few hours before needing to return. She states returning to the hospital "sucks".   We discussed her diagnoses, prognosis, GOC, EOL wishes disposition and options.  Created space and opportunity for patient  to explore thoughts and feelings regarding current medical information.   A detailed discussion was had today regarding advanced directives.  Concepts specific to code status, artifical feeding and hydration, IV antibiotics and rehospitalization were discussed.  The difference between an aggressive medical intervention path and a comfort care path was discussed.  Values and goals of care important to patient and family were attempted to be elicited.  Discussed limitations of medical interventions to prolong quality of life in some situations and discussed the concept of human mortality. Discussed  concern for limited prognosis.   She states she wants to go to rehab to live because she knows she cannot come home as she will not have help at home. She then states she wants to go home. She then states she wants to go to hospice, and vacillates between the options. She seems confused with higher level decision making. She states she will consider her options and to talk to her nephew [name].   Spoke with [name]. He states she moved to [place] to be with his parents who were her friends, but she is not his blood relative- his blood nephew. He states he has tried to help her as he would a family member. He is clear he has to work and is gone many hours of the day. He states he cannot be on the phone as that is a firable offense and he almost lost his job for being out with her previously. He states he has been updated this morning by attending team, and understands her limited and poor prognosis. I advised I agree with this. He states if she agrees to hospice facility, he is amenable to this plan. He states if she does not, he will make decisions moving forward.    SUMMARY OF RECOMMENDATIONS   Patient wants to  consider options and vacillates between them. Concern for higher level decision making capacity. Spoke with [name] who is amenable to hospice facility if she agrees to it, and states he will make the decisions if she will not.   Prognosis:  < 2 weeks        Primary Diagnoses: Present on Admission:  COPD exacerbation ([place])  Tobacco use  Hypertension  Acute on chronic diastolic CHF (congestive heart failure) (HCC)  Alcohol abuse  Acute on chronic respiratory failure with hypoxia (HCC)  Sepsis (HCC)  Atrial fibrillation with RVR (HCC)  Hyponatremia  Elevated troponin  HLD (hyperlipidemia)  CAD (coronary artery disease)  Hypokalemia  Pleural effusion   I have reviewed the medical record, interviewed the patient and family, and examined the patient. The following aspects are  pertinent.  Past Medical History:  Diagnosis Date   Aortic aneurysm ([place]) 08/08/2021   Aortic atherosclerosis ([place]) 08/08/2021   Cancer (HCC)    right lung removed - lung cancer 1/3 lung removed    CHF (congestive heart failure) (HCC)    Chronic diastolic CHF (congestive heart failure) ([place]) 08/08/2021   Chronic hyponatremia 11/04/2015   Overview:  Normal serum oslmolality   COPD (chronic obstructive pulmonary disease) (HCC)    Coronary artery disease    Emphysema ([place]) 08/08/2021   History of stomach ulcers    Hypertension    Lung mass 08/08/2021   Ovarian mass 08/08/2021   Pneumonia    Unspecified atrial fibrillation ([place]) 08/08/2021   Social History   Socioeconomic History   Marital status: Single    Spouse name: Not on file   Number of children: Not on file   Years of education: Not on file   Highest education level: Not on file  Occupational History   Not on file  Tobacco Use   Smoking status: Former    Packs/day: 1.00    Years: 66.00    Pack years: 66.00    Types: Cigarettes    Quit date: 06/26/2020    Years since quitting: 1.1   Smokeless tobacco: Never  Vaping Use   Vaping Use: Never used  Substance and Sexual Activity   Alcohol use: Not Currently    Comment: use to drink alot ( vodka) 6 months ago   Drug use: Never   Sexual activity: Not Currently  Other Topics Concern   Not on file  Social History Narrative   Not on file   Social Determinants of Health   Financial Resource Strain: Not on file  Food Insecurity: Not on file  Transportation Needs: Not on file  Physical Activity: Not on file  Stress: Not on file  Social Connections: Not on file   Family History  Problem Relation Age of Onset   Breast cancer Neg Hx    Scheduled Meds:  atorvastatin  40 mg Oral QHS   calcium carbonate  1 tablet Oral BID WC   cholecalciferol  1,000 Units Oral Daily   docusate sodium  100 mg Oral Daily   folic acid  1 mg Oral Daily   furosemide  40 mg Intravenous  Q12H   gabapentin  100 mg Oral TID   heparin  5,000 Units Subcutaneous Q8H   ipratropium-albuterol  3 mL Nebulization Q6H   LORazepam  0-4 mg Intravenous Q12H   methylPREDNISolone (SOLU-MEDROL) injection  40 mg Intravenous Q12H   metoprolol tartrate  50 mg Oral BID   multivitamin with minerals  1 tablet Oral Daily   nicotine  21  mg Transdermal Daily   thiamine  100 mg Oral Daily   Or   thiamine  100 mg Intravenous Daily   Continuous Infusions:  sodium chloride Stopped (08/13/21 1517)   ceFEPime (MAXIPIME) IV Stopped (08/15/21 0952)   PRN Meds:.sodium chloride, acetaminophen, albuterol, dextromethorphan-guaiFENesin, diclofenac Sodium, loperamide, ondansetron (ZOFRAN) IV Medications Prior to Admission:  Prior to Admission medications   Medication Sig Start Date End Date Taking? Authorizing Provider  Albuterol Sulfate (PROAIR RESPICLICK) 160 (90 Base) MCG/ACT AEPB Inhale 2 puffs into the lungs every 6 (six) hours as needed. 06/28/20  Yes Danford, Suann Larry, MD  amLODipine (NORVASC) 10 MG tablet Take 10 mg by mouth daily.   Yes [provider]  atorvastatin (LIPITOR) 40 MG tablet Take 40 mg by mouth at bedtime.   Yes [provider]  calcium carbonate (OS-CAL - DOSED IN MG OF ELEMENTAL CALCIUM) 1250 (500 Ca) MG tablet Take 1 tablet by mouth 2 (two) times daily with a meal.    Yes [provider]  Cholecalciferol 1000 units capsule Take 1,000 Units by mouth daily.   Yes [provider]  diclofenac Sodium (VOLTAREN) 1 % GEL Apply topically 2 (two) times daily. Apply to upper back and shoulders every morning and at bedtime related to muscle weakness.   Yes [provider]  docusate sodium (COLACE) 100 MG capsule Take 100 mg by mouth daily.   Yes [provider]  gabapentin (NEURONTIN) 300 MG capsule Take 300 mg by mouth 3 (three) times daily.   Yes [provider]  ipratropium (ATROVENT) 0.02 % nebulizer solution Take 0.5 mg by  nebulization every 12 (twelve) hours as needed for wheezing or shortness of breath.   Yes [provider]  lisinopril (ZESTRIL) 10 MG tablet Take 10 mg by mouth daily.    Yes [provider]  loperamide (IMODIUM) 2 MG capsule Take 4 mg by mouth as needed for diarrhea or loose stools.   Yes [provider]  metoprolol succinate (TOPROL-XL) 25 MG 24 hr tablet Take 25 mg by mouth daily.   Yes [provider]  predniSONE (DELTASONE) 20 MG tablet Take 2 tablets (40 mg total) by mouth daily for 5 days. 08/11/21 08/16/21 Yes Wyvonnia Dusky, MD  sodium chloride 1 g tablet Take 2 g by mouth 3 (three) times daily.   Yes [provider]  sulfamethoxazole-trimethoprim (BACTRIM DS) 800-160 MG tablet Take 1 tablet by mouth every 12 (twelve) hours for 4 days. 08/11/21 08/15/21 Yes Wyvonnia Dusky, MD   Allergies  Allergen Reactions   Aspirin Other (See Comments)    High risk of bleeding   Review of Systems  Respiratory:  Positive for shortness of breath.    Physical Exam Pulmonary:     Comments: Work of breathing noted. Congested breathing noted.  Neurological:     Mental Status: She is alert.    Vital Signs: BP 135/82 (BP Location: Right Arm)   Pulse 98   Temp 97.6 F (36.4 C)   Resp 18   Ht 5\' 3"  (1.6 m)   Wt 49.9 kg   SpO2 97%   BMI 19.50 kg/m  Pain Scale: 0-10   Pain Score: 0-No pain   SpO2: SpO2: 97 % O2 Device:SpO2: 97 % O2 Flow Rate: .O2 Flow Rate (L/min): 3 L/min  IO: Intake/output summary:  Intake/Output Summary (Last 24 hours) at 08/15/2021 1541 Last data filed at 08/15/2021 1104 Gross per 24 hour  Intake 610.82 ml  Output 3700  ml  Net -3089.18 ml    LBM:   Baseline Weight: Weight: 51.6 kg Most recent weight: Weight: 49.9 kg      Time In: 2:35 Time Out: 3:45 Time Total: 70 min Greater than 50%  of this time was spent counseling and coordinating care related to the above assessment and plan.  Signed  by: Asencion Gowda, NP   Please contact Palliative Medicine Team phone at [phone] for questions and concerns.  For individual provider: See Shea Evans

## 2021-08-15 NOTE — TOC Progression Note (Signed)
Transition of Care [facility]) - Progression Note    Patient Details  Name: Allanna Bresee MRN: [id number] Date of Birth: 1941-06-23  Transition of Care [facility]) CM/SW [place], [place] Phone Number: 08/15/2021, 9:17 AM  Clinical Narrative:      CSW notes patient not fully oriented. CSW spoke with patient's nephew regarding bed offers, current bed offers are in [place] he reports he lives in [place] and would like something closer. Reports [facility] in [place] is 5 minute from his house and preference.   CSW has sent referral in hub to [facility] and called and lvm with admissions Rickey for decision on if they can accept patient.       Expected Discharge Plan and Services                                                 Social Determinants of Health (SDOH) Interventions    Readmission Risk Interventions No flowsheet data found.

## 2021-08-15 NOTE — Progress Notes (Signed)
Rapid Response Event Note   Reason for Call : Respiratory distress, hypertensive, Increased HR   Initial Focused Assessment: Pt has eyes closed but can nod appropriately to questions. Pt states she is in pain. HR 130s, BP elevated, increased RR on arrival.    Interventions: Nonrebreather placed on pt by RT. IV morphine and lopressor given per Dr. Sidney Ace. Pt seems to be resting better after medication administered. HR remains elevated in the 130-140s. Pt is a DNR at this time.   Plan of Care: Pt will remain on 2A at this time. Dr. Sidney Ace aware of condition and vitals. Primary RN or charge RN to call this RN if needed further. Will follow up and reassess pt.   Event Summary:   MD Notified: Dr. Sidney Ace Call Time: 1947 Arrival Time: 1950 End Time: 2015  Trellis Paganini, RN

## 2021-08-16 DIAGNOSIS — Z7189 Other specified counseling: Secondary | ICD-10-CM | POA: Diagnosis not present

## 2021-08-16 DIAGNOSIS — J9621 Acute and chronic respiratory failure with hypoxia: Secondary | ICD-10-CM | POA: Diagnosis not present

## 2021-08-16 LAB — COMPREHENSIVE METABOLIC PANEL
ALT: 21 U/L (ref 0–44)
AST: 14 U/L — ABNORMAL LOW (ref 15–41)
Albumin: 3.4 g/dL — ABNORMAL LOW (ref 3.5–5.0)
Alkaline Phosphatase: 85 U/L (ref 38–126)
Anion gap: 13 (ref 5–15)
BUN: 29 mg/dL — ABNORMAL HIGH (ref 8–23)
CO2: 37 mmol/L — ABNORMAL HIGH (ref 22–32)
Calcium: 9.1 mg/dL (ref 8.9–10.3)
Chloride: 85 mmol/L — ABNORMAL LOW (ref 98–111)
Creatinine, Ser: 0.61 mg/dL (ref 0.44–1.00)
GFR, Estimated: >60 mL/min (ref >60)
Glucose, Bld: 120 mg/dL — ABNORMAL HIGH (ref 70–99)
Potassium: 3.4 mmol/L — ABNORMAL LOW (ref 3.5–5.1)
Sodium: 135 mmol/L (ref 135–145)
Total Bilirubin: 1.4 mg/dL — ABNORMAL HIGH (ref 0.3–1.2)
Total Protein: 6 g/dL — ABNORMAL LOW (ref 6.5–8.1)

## 2021-08-16 LAB — URINE CULTURE: Culture: NO GROWTH

## 2021-08-16 LAB — CBC
HCT: 43.5 % (ref 36.0–46.0)
Hemoglobin: 14.5 g/dL (ref 12.0–15.0)
MCH: 31.5 pg (ref 26.0–34.0)
MCHC: 33.3 g/dL (ref 30.0–36.0)
MCV: 94.4 fL (ref 80.0–100.0)
Platelets: 226 10*3/uL (ref 150–400)
RBC: 4.61 MIL/uL (ref 3.87–5.11)
RDW: 15 % (ref 11.5–15.5)
WBC: 13.9 10*3/uL — ABNORMAL HIGH (ref 4.0–10.5)
nRBC: 0 % (ref 0.0–0.2)

## 2021-08-16 LAB — LACTATE DEHYDROGENASE: LDH: 150 U/L (ref 98–192)

## 2021-08-16 MED ORDER — POTASSIUM CHLORIDE 20 MEQ PO PACK
40.0000 meq | PACK | Freq: Once | ORAL | Status: AC
  Administered 2021-08-16: 40 meq via ORAL
  Filled 2021-08-16: qty 2

## 2021-08-16 MED ORDER — GUAIFENESIN ER 600 MG PO TB12
600.0000 mg | ORAL_TABLET | Freq: Two times a day (BID) | ORAL | Status: DC
  Administered 2021-08-16 – 2021-08-20 (×8): 600 mg via ORAL
  Filled 2021-08-16 (×8): qty 1

## 2021-08-16 NOTE — TOC Progression Note (Signed)
Transition of Care [facility]) - Progression Note    Patient Details  Name: Felicia Sellers MRN: [id number] Date of Birth: [birth year]  Transition of Care [facility]) CM/SW [place], [place] Phone Number: 08/16/2021, 11:42 AM  Clinical Narrative:     CSW spoke with patient's nephew [name] regarding prognosis, reiterated discussion had with Crystal NP with Palliative. All in agreement that patient is hospice facility appropriate, [name] has preference for hospice home in [place] with [facility]. Referral made to [facility]'s Lorenza [place] who will follow up with [name] at [place] today.        Expected Discharge Plan and Services                                                 Social Determinants of Health (SDOH) Interventions    Readmission Risk Interventions No flowsheet data found.

## 2021-08-16 NOTE — Progress Notes (Signed)
Occupational Therapy Treatment Patient Details Name: Felicia Sellers MRN: [id number] DOB: October 21, 1941 Today's Date: 08/16/2021   History of present illness Pt is an 80 y/o F admitted on 08/12/21 with c/c of SOB. Pt recently hospitalized 10/16-10/20 2/2 COPD exacerbation & L pleural effusion, undergoing thoracentesis. Pt is currently being treated for acute on chronic respiratory failure with hypoxia due to combination of COPD & CHF exacerbation & sepsis 2/2 COPD exacerbation. PMH: COPD on 4L O2, HTN, HLD, a-fib on anticoagulants, stomach ulcer, CAD, CHF, lung CA s/p R lobectomy, tobacco & alcohol abuse   OT comments  Pt seen for brief, bed level OT tx session. Noted with rapid response overnight. Pt declining EOB attempts but agreeable to bed level grooming. Set up only and encouragement. Pt endorses mild abdominal pain not improved with repositioning in bed. RN notified of session. Will continue to progress as able towards OT goals.    Recommendations for follow up therapy are one component of a multi-disciplinary discharge planning process, led by the attending physician.  Recommendations may be updated based on patient status, additional functional criteria and insurance authorization.    Follow Up Recommendations  Skilled nursing-short term rehab (<3 hours/day)    Assistance Recommended at Discharge Frequent or constant Supervision/Assistance  Equipment Recommendations  Other (comment) (defer to next venue)    Recommendations for Other Services      Precautions / Restrictions Precautions Precautions: Fall Restrictions Weight Bearing Restrictions: No       Mobility Bed Mobility               General bed mobility comments: Pt declined    Transfers                   General transfer comment: pt declined     Balance                                           ADL either performed or assessed with clinical judgement   ADL       Grooming:  Wash/dry hands;Wash/dry face;Bed level;Set up                                       Vision       Perception     Praxis      Cognition   Behavior During Therapy: Flat affect Overall Cognitive Status: No family/caregiver present to determine baseline cognitive functioning                                 General Comments: alert and oriented, appropriate conversationally          Exercises     Shoulder Instructions       General Comments      Pertinent Vitals/ Pain       Pain Assessment: Faces Faces Pain Scale: Hurts a little bit Pain Location: abdomen Pain Intervention(s): Limited activity within patient's tolerance;Monitored during session;Repositioned  Home Living                                          Prior Functioning/Environment  Frequency  Min 2X/week        Progress Toward Goals  OT Goals(current goals can now be found in the care plan section)  Progress towards OT goals: OT to reassess next treatment  Acute Rehab OT Goals OT Goal Formulation: With patient Time For Goal Achievement: 08/27/21 Potential to Achieve Goals: [place] Discharge plan remains appropriate;Frequency remains appropriate    Co-evaluation                 AM-PAC OT "6 Clicks" Daily Activity     Outcome Measure   Help from another person eating meals?: None Help from another person taking care of personal grooming?: A Little Help from another person toileting, which includes using toliet, bedpan, or urinal?: A Lot Help from another person bathing (including washing, rinsing, drying)?: A Lot Help from another person to put on and taking off regular upper body clothing?: A Little Help from another person to put on and taking off regular lower body clothing?: A Lot 6 Click Score: 16    End of Session    OT Visit Diagnosis: Other abnormalities of gait and mobility (R26.89);Muscle weakness  (generalized) (M62.81)   Activity Tolerance Patient tolerated treatment well   Patient Left in bed;with call bell/phone within reach;with bed alarm set   Nurse Communication Mobility status        Time: 9604-5409 OT Time Calculation (min): 12 min  Charges: OT General Charges $OT Visit: 1 Visit OT Treatments $Self Care/Home Management : 8-22 mins  Ardeth Perfect., MPH, MS, OTR/L ascom [phone] 08/16/21, 11:46 AM

## 2021-08-16 NOTE — Progress Notes (Addendum)
Daily Progress Note   Patient Name: Felicia Sellers       Date: 08/16/2021 DOB: 1941/06/23  Age: 80 y.o. MRN#: [id number] Attending Physician: Val Riles, MD Primary Care Physician: Langley Gauss Primary Care Admit Date: 08/12/2021  Reason for Consultation/Follow-up: Establishing goals of care  Subjective: Patient is resting in bed. She has audible congested breathing, with congestion not clearing with coughing. She is able to tell me we are at the hospital, the year is 2022, and she is at the hospital because of issues with her lungs.   She continues to be confused with higher level thinking. She initially states she wants to do what she can to live as long as she can, and wants to go to rehab. She then discusses Charles's father who was placed on a ventilator against his will and suffered horribly before dying. She states this is not what she wants for herself.   Discussed philosophy and care at hospice. She becomes angry telling me she does not want to be placed on a ventilator, or to suffer as Charles's father did, that she just wants to die. Attempted to discuss again the purpose and philosophy of hospice, but she states I am incorrect and hospice will try to keep her alive and make her suffer. She again is plain "I just want to die! I'm ready to die."  She states to speak with [name]. Discussed our conversation yesterday. She would like a comfort path.   Hospital is attempting to consolidate and minimize phone calls to [facility] as he was clear yesterday he is not supposed to be on the phone at work, and has to work as [employer] is not an option for him because patient is not his genetic family.Per notes, TOC has spoken with [name] and hospice referral has been made.   Length of Stay: 4  Current  Medications: Scheduled Meds:   atorvastatin  40 mg Oral QHS   calcium carbonate  1 tablet Oral BID WC   Chlorhexidine Gluconate Cloth  6 each Topical Daily   cholecalciferol  1,000 Units Oral Daily   docusate sodium  100 mg Oral Daily   feeding supplement (NEPRO CARB STEADY)  237 mL Oral TID BM   folic acid  1 mg Oral Daily   furosemide  40 mg Intravenous Q12H  gabapentin  100 mg Oral TID   heparin  5,000 Units Subcutaneous Q8H   ipratropium-albuterol  3 mL Nebulization Q6H   methylPREDNISolone (SOLU-MEDROL) injection  40 mg Intravenous Q12H   metoprolol tartrate  50 mg Oral BID   multivitamin with minerals  1 tablet Oral Daily   nicotine  21 mg Transdermal Daily   thiamine  100 mg Oral Daily   Or   thiamine  100 mg Intravenous Daily    Continuous Infusions:  sodium chloride Stopped (08/13/21 1517)   ceFEPime (MAXIPIME) IV 2 g (08/16/21 0945)   diltiazem (CARDIZEM) infusion 5 mg/hr (08/15/21 2103)    PRN Meds: sodium chloride, acetaminophen, albuterol, dextromethorphan-guaiFENesin, diclofenac Sodium, loperamide, morphine injection, ondansetron (ZOFRAN) IV  Physical Exam Pulmonary:     Comments: Congested breathing and coughing.  Neurological:     Mental Status: She is alert.            Vital Signs: BP (!) 113/57 (BP Location: Right Arm)   Pulse 88   Temp 97.8 F (36.6 C)   Resp 17   Ht 5\' 3"  (1.6 m)   Wt 48.2 kg   SpO2 100%   BMI 18.82 kg/m  SpO2: SpO2: 100 % O2 Device: O2 Device: Nasal Cannula O2 Flow Rate: O2 Flow Rate (L/min): 7 L/min  Intake/output summary:  Intake/Output Summary (Last 24 hours) at 08/16/2021 1241 Last data filed at 08/16/2021 1120 Gross per 24 hour  Intake 733.45 ml  Output 3359 ml  Net -2625.55 ml   LBM:   Baseline Weight: Weight: 51.6 kg Most recent weight: Weight: 48.2 kg         Patient Active Problem List   Diagnosis Date Noted   COPD exacerbation ([place]) 08/12/2021   Sepsis ([place]) 08/12/2021   Atrial fibrillation with  RVR ([place]) 08/12/2021   Hyponatremia 08/12/2021   Elevated troponin 08/12/2021   HLD (hyperlipidemia) 08/12/2021   CAD (coronary artery disease) 08/12/2021   Hypokalemia 08/12/2021   Pleural effusion 08/12/2021   Protein-calorie malnutrition, severe 08/09/2021   Acute on chronic diastolic CHF (congestive heart failure) ([place]) 08/08/2021   Tobacco dependence 08/08/2021   Alcohol abuse 08/08/2021   Aortic atherosclerosis ([place]) 08/08/2021   Emphysema ([place]) 08/08/2021   Unspecified atrial fibrillation ([place]) 08/08/2021   Lung mass 08/08/2021   Aortic aneurysm ([place]) 08/08/2021   Ovarian mass 08/08/2021   Acute respiratory failure with hypoxia ([place]) 08/08/2021   COPD with acute exacerbation ([place]) 08/07/2021   Pleural effusion on left 08/07/2021   Acute on chronic respiratory failure with hypoxia ([place]) 07/09/2021   Coronary artery disease involving native coronary artery of native heart without angina pectoris 07/19/2020   COPD, moderate ([place]) 07/19/2020   Personal history of lung cancer 07/19/2020   Leukocytosis 06/26/2020   Thoracic aortic aneurysm without rupture 08/16/2018   Kyphosis 08/07/2018   Hypertension 08/07/2018   Tobacco use 08/07/2018   Age-related osteoporosis without current pathological fracture 12/21/2016   Chronic hyponatremia 11/04/2015   Chronic low back pain 06/18/2015   Cardiomyopathy ([place]) 08/13/2014   Left bundle branch block (LBBB) 07/23/2014   Pedal edema 07/23/2014   SOB (shortness of breath) on exertion 07/23/2014    Palliative Care Assessment & Plan     Recommendations/Plan: Hospital is attempting to consolidate and minimize phone calls to [facility] as he was clear yesterday he is not supposed to be on the phone at work, and has to work as [employer] is not an option for him because patient is not his genetic  family. Per notes, TOC has spoken with [name] and hospice referral has been made.   Code Status:    Code Status Orders  (From admission, onward)            Start     Ordered   08/13/21 1111  Do not attempt resuscitation (DNR)  Continuous       Question Answer Comment  In the event of cardiac or respiratory ARREST Do not call a "code blue"   In the event of cardiac or respiratory ARREST Do not perform Intubation, CPR, defibrillation or ACLS   In the event of cardiac or respiratory ARREST Use medication by any route, position, wound care, and other measures to relive pain and suffering. May use oxygen, suction and manual treatment of airway obstruction as needed for comfort.      08/13/21 1110           Code Status History     Date Active Date Inactive Code Status Order ID Comments User Context   08/07/2021 0851 08/12/2021 0122 DNR [id number]  Karmen Bongo, MD ED   07/09/2021 0333 07/13/2021 2226 Full Code [id number]  Athena Masse, MD ED   06/27/2020 0031 06/28/2020 2215 Full Code [id number]  Chotiner, Yevonne Aline, MD ED       Prognosis:  < 2 weeks Poor PO intake. COPD/CHF.     Care plan was discussed with [facility]  Thank you for allowing the Palliative Medicine Team to assist in the care of this patient.       Total Time 25 min Prolonged Time Billed  no       Greater than 50%  of this time was spent counseling and coordinating care related to the above assessment and plan.  Asencion Gowda, NP  Please contact Palliative Medicine Team phone at [phone] for questions and concerns.

## 2021-08-16 NOTE — Progress Notes (Signed)
PT Cancellation Note  Patient Details Name: Felicia Sellers MRN: [id number] DOB: 1940-11-28   Cancelled Treatment:     Pt resting in bed comfortably, had Rapid Response Event last night. Does not appear able to tolerate skilled pt during this attempt. Will reassess next availability per POC.   [name] 08/16/2021, 11:12 AM

## 2021-08-16 NOTE — Progress Notes (Signed)
PROGRESS NOTE  Felicia Sellers PJA:[id number] DOB: 11-Nov-1940   PCP: [facility]  Patient is from: Home.  Reportedly independent for most ADLs at baseline.  DOA: 08/12/2021 LOS: 4  Chief complaints:  Chief Complaint  Patient presents with   Respiratory Distress    Pt in via EMS from pt's home for SOB. EMS report pt's SPO2 at home on 4l 91%. EMS report pt's family states they can't take care of her anymore. EMS place pt on 6l n/c 96%.     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 fib not on AC, stomach ulcer, CAD, diastolic CHF, lung cancer s/p right lobectomy, tobacco use disorder, alcohol abuse, ovarian mass, chronic hyponatremia, severe malnutrition, HTN, HLD and recurrent hospitalization with respiratory failure returning with shortness of breath and admitted with acute on chronic RF with hypoxia due to COPD and CHF exacerbation.  Initially required BiPAP.  Started on IV Zosyn, IV Lasix, steroid and breathing treatments.  Subjective: Seen and examined earlier this morning.  Complains of lower abdominal pain and inability to urinate. Tachypnic, not speaking in full sentences.   Objective: Vitals:   08/16/21 0927 08/16/21 1119 08/16/21 1350 08/16/21 1629  BP:  (!) 113/57  116/66  Pulse: 83 88 97 82  Resp:  17 20 17   Temp:  97.8 F (36.6 C)  98.3 F (36.8 C)  TempSrc:  Oral  Oral  SpO2:  100% 97% 100%  Weight:      Height:        Intake/Output Summary (Last 24 hours) at 08/16/2021 1704 Last data filed at 08/16/2021 1629 Gross per 24 hour  Intake 480 ml  Output 2450 ml  Net -1970 ml   Filed Weights   08/14/21 0512 08/15/21 0253 08/16/21 0500  Weight: 52.2 kg 49.9 kg 48.2 kg    Examination:  GENERAL: Frail looking elderly female. In mild respiratory distress HEENT: MMM.  Hard of hearing  NECK: Supple.  No apparent JVD.  RESP: rhonchi and rales throughout CVS:  RRR. Heart sounds normal.  ABD/GI/GU: BS+. Abd  soft, mild tenderness suprapubically MSK/EXT:  Moves extremities. No apparent deformity. No edema.  SKIN: no apparent skin lesion or wound NEURO: Awake and alert.  PSYCH: Calm. Normal affect.     Procedures:  None  Microbiology summarized: HALPF-79 and influenza PCR nonreactive. Sputum culture pending. Blood culture pending.  Assessment & Plan: Acute on chronic respiratory failure with hypoxia due to combination of COPD, CHF, A. fib, pleural effusion and underlying lung cancer.  Third hospitalization for the same issue in less than 5 weeks.  She was discharged on 10/16 on p.o. Augmentin, Bactrim and 4 L by [place].  Currently saturating at 99% on 4L. Worsening respirations this am. Patient appears to be deteriorating. CT that resulted this afternoon shows reaccumulation of left sided pleural effusion. Previous cultures/cytology neg - cont o2, abx, steroids - abg ordered, may need bipap - palliative consulted - repeat diagnostic/therapeutic thoracentesis was ordered but as per IR, suggested that patient have To mucous plugging in the left lower lobe which is subsequently collapsed, thoracentesis will not help so it was not done.   Urinary retention Symptomatic. Retaining 1100 this morning, s/p I/o cath. Urinary retention persisted throughout the day, most recent pvr 700 ml - place foley   Acute on chronic diastolic CHF (congestive heart failure): TTE in 06/2021 with LVEF of 55 to 60% (from 70 to 75% in 2021) and  moderate TVR. 07/09/2021 showed EF of 55 to 60%.  She had edema, JVD, rhonchi and rales on exam.  CXR with pulmonary edema and increased pleural effusion.  BNP elevated to 770.  Started on IV Lasix.  She had 2.1 L UOP/24 hours.   -Continue IV Lasix 40 mg twice daily -Discontinued amlodipine and lisinopril to allow room for diuresis. -Monitor fluid status, renal functions and electrolytes. -Sodium and fluid restrictions -REDs Vest reading   COPD exacerbation: Sputum culture grew  Acinetobacter and moderate diphtheroid prior hospitalization.  She was discharged on Augmentin and Bactrim.  Remains rhonchorous on exam.  Has leukocytosis but Pro-Cal negative.  Ongoing tobacco use.  -Discontinued Augmentin and Bactrim -Changed since Zosyn to IV cefepime and azithromycin -Continue IV Solu-Medrol and bronchodilators -Continue mucolytic's/antitussive/IS -OOB/PT/OT -Titrate oxygen for saturation above 90% -Follow sputum cultures  Persistent A. fib with RVR: RVR resolved.  Not on anticoagulation likely due to history of stomach ulcer.  On Toprol-XL at home. -Metoprolol 25 mg twice daily, increase to 50  Acute metabolic encephalopathy: Resolved.   -Reorientation delirium precautions. -Minimize or avoid sedating medications  Dysphagia: Likely in the setting of encephalopathy. -Continue dysphagia 3 diet -SLP eval   Sepsis ruled out.    Essential hypertension: Normotensive. -IV Lasix and metoprolol as above -Discontinued amlodipine and lisinopril   Tobacco use disorder: Reportedly smokes about a pack a day. -Cessation counseling when able to comprehend. -Continue nicotine patch  Alcohol abuse-Per recent discharge summary, sober over the last 1 month. Has had intermittent ativan here but no clear signs of withdrawal. -Continue CIWA, multivitamin, folic acid and thiamine   Personal history of lung cancer -s/p of right lobectomy, 1/3 of right lung   Chronic hyponatremia: Improved.  Could be due to CHF and Bactrim. -Continue IV Lasix -Fluid and sodium restriction   Elevated troponin and history of CAD: Troponin is minimally elevated 19 > 23 > 18.  Likely demand ischemia -Cardiac meds as above -Continue home statin.  Hypokalemia: K 3.7.  Mg 1.8. -P.o. KCl 40x1 -IV magnesium sulfate 2 g x 1  Physical deconditioning -PT/OT recommended SNF.   Goal of care counseling-appropriately DNR/DNI.  A lot of comorbidity.  Poor long-term prognosis. I am concerned patient is  nearing death. -Palliative medicine consulted on admission Discussion was done with patient's family who made the decision for hospice care, patient has no capacity to make the decision.     Severe malnutrition: As evidenced by low BMI and significant muscle mass and subcu fat loss. Body mass index is 18.82 kg/m. Nutrition Problem: Severe Malnutrition Etiology: chronic illness (COPD, CHF, lung cancer, etoh abuse)-Consult dietitian Signs/Symptoms: severe fat depletion, severe muscle depletion     DVT prophylaxis:  heparin injection 5,000 Units Start: 08/13/21 1400  Code Status: DNR/DNI Family Communication: nephew who is next of kin updated today on patient's tenuous status Level of care: Med-Surg Status is: Inpatient  Remains inpatient appropriate because: IV medication and need for palliative medicine safe disposition Patient will be discharged to hospice care when bed will be available   Consultants:  Palliative medicine   Sch Meds:  Scheduled Meds:  atorvastatin  40 mg Oral QHS   calcium carbonate  1 tablet Oral BID WC   Chlorhexidine Gluconate Cloth  6 each Topical Daily   cholecalciferol  1,000 Units Oral Daily   docusate sodium  100 mg Oral Daily   feeding supplement (NEPRO CARB STEADY)  237 mL Oral TID BM   folic acid  1  mg Oral Daily   furosemide  40 mg Intravenous Q12H   gabapentin  100 mg Oral TID   heparin  5,000 Units Subcutaneous Q8H   ipratropium-albuterol  3 mL Nebulization Q6H   methylPREDNISolone (SOLU-MEDROL) injection  40 mg Intravenous Q12H   metoprolol tartrate  50 mg Oral BID   multivitamin with minerals  1 tablet Oral Daily   nicotine  21 mg Transdermal Daily   thiamine  100 mg Oral Daily   Or   thiamine  100 mg Intravenous Daily   Continuous Infusions:  sodium chloride Stopped (08/13/21 1517)   ceFEPime (MAXIPIME) IV 2 g (08/16/21 0945)   diltiazem (CARDIZEM) infusion 5 mg/hr (08/15/21 2103)   PRN Meds:.sodium chloride, acetaminophen,  albuterol, dextromethorphan-guaiFENesin, diclofenac Sodium, loperamide, morphine injection, ondansetron (ZOFRAN) IV  Antimicrobials: Anti-infectives (From admission, onward)    Start     Dose/Rate Route Frequency Ordered Stop   08/13/21 1400  amoxicillin-clavulanate (AUGMENTIN) 500-125 MG per tablet 500 mg  Status:  Discontinued        1 tablet Oral Every 8 hours 08/13/21 1110 08/13/21 1233   08/13/21 1400  azithromycin (ZITHROMAX) 500 mg in sodium chloride 0.9 % 250 mL IVPB        500 mg 250 mL/hr over 60 Minutes Intravenous Every 24 hours 08/13/21 1254 08/15/21 1350   08/13/21 1345  ceFEPIme (MAXIPIME) 2 g in sodium chloride 0.9 % 100 mL IVPB        2 g 200 mL/hr over 30 Minutes Intravenous Every 12 hours 08/13/21 1330     08/13/21 1300  metroNIDAZOLE (FLAGYL) IVPB 500 mg  Status:  Discontinued        500 mg 100 mL/hr over 60 Minutes Intravenous Every 12 hours 08/13/21 1254 08/13/21 1254   08/13/21 1115  sulfamethoxazole-trimethoprim (BACTRIM DS) 800-160 MG per tablet 1 tablet  Status:  Discontinued        1 tablet Oral Every 12 hours 08/13/21 1110 08/13/21 1253   08/12/21 1430  piperacillin-tazobactam (ZOSYN) IVPB 3.375 g  Status:  Discontinued        3.375 g 12.5 mL/hr over 240 Minutes Intravenous Every 8 hours 08/12/21 1406 08/13/21 1254        I have personally reviewed the following labs and images: CBC: Recent Labs  Lab 08/11/21 0405 08/12/21 0840 08/14/21 0624 08/15/21 0444 08/16/21 0529  WBC 10.2 17.7* 9.6 14.0* 13.9*  NEUTROABS  --   --  7.1  --   --   HGB 12.8 13.6 14.4 15.3* 14.5  HCT 37.9 39.5 42.1 43.0 43.5  MCV 93.6 95.4 92.7 93.3 94.4  PLT 268 286 281 266 226   BMP &GFR Recent Labs  Lab 08/12/21 0642 08/12/21 0911 08/13/21 0617 08/13/21 1226 08/14/21 0624 08/15/21 0444 08/16/21 0529  NA  --    < > 130* 131* 132* 134* 135  K  --    < > 3.3* 3.7 3.7 3.5 3.4*  CL  --    < > 86* 85* 86* 83* 85*  CO2  --    < > 35* 34* 35* 33* 37*  GLUCOSE  --    <  > 93 98 96 124* 120*  BUN  --    < > 15 17 19  29* 29*  CREATININE  --    < > 0.62 0.75 0.60 0.78 0.61  CALCIUM  --    < > 8.3* 8.5* 8.4* 8.8* 9.1  MG 2.1  --   --  1.9  1.8 2.2  --   PHOS 2.2*  --   --   --  4.2 4.0  --    < > = values in this interval not displayed.   Estimated Creatinine Clearance: 41.3 mL/min (by C-G formula based on SCr of 0.61 mg/dL). Liver & Pancreas: Recent Labs  Lab 08/12/21 0625 08/14/21 0624 08/15/21 0444 08/16/21 0529  AST 23  --   --  14*  ALT 40  --   --  21  ALKPHOS 159*  --   --  85  BILITOT 1.1  --   --  1.4*  PROT 8.2*  --   --  6.0*  ALBUMIN 4.7 3.4* 3.8 3.4*   No results for input(s): LIPASE, AMYLASE in the last 168 hours. Recent Labs  Lab 08/14/21 0624  AMMONIA 17   Diabetic: No results for input(s): HGBA1C in the last 72 hours.  Recent Labs  Lab 08/10/21 2133  GLUCAP 107*   Cardiac Enzymes: No results for input(s): CKTOTAL, CKMB, CKMBINDEX, TROPONINI in the last 168 hours. No results for input(s): PROBNP in the last 8760 hours. Coagulation Profile: No results for input(s): INR, PROTIME in the last 168 hours. Thyroid Function Tests: Recent Labs    08/14/21 0624  TSH 2.653   Lipid Profile: No results for input(s): CHOL, HDL, LDLCALC, TRIG, CHOLHDL, LDLDIRECT in the last 72 hours.  Anemia Panel: No results for input(s): VITAMINB12, FOLATE, FERRITIN, TIBC, IRON, RETICCTPCT in the last 72 hours. Urine analysis:    Component Value Date/Time   COLORURINE STRAW (A) 08/15/2021 1055   APPEARANCEUR CLEAR (A) 08/15/2021 1055   APPEARANCEUR Clear 05/28/2014 0855   LABSPEC 1.008 08/15/2021 1055   LABSPEC 1.013 05/28/2014 0855   PHURINE 6.0 08/15/2021 1055   GLUCOSEU NEGATIVE 08/15/2021 1055   GLUCOSEU Negative 05/28/2014 0855   HGBUR NEGATIVE 08/15/2021 1055   BILIRUBINUR NEGATIVE 08/15/2021 1055   BILIRUBINUR Negative 05/28/2014 0855   KETONESUR NEGATIVE 08/15/2021 1055   PROTEINUR NEGATIVE 08/15/2021 1055   NITRITE NEGATIVE  08/15/2021 1055   LEUKOCYTESUR NEGATIVE 08/15/2021 1055   LEUKOCYTESUR Negative 05/28/2014 0855   Sepsis Labs: Invalid input(s): PROCALCITONIN, [place]  Microbiology: Recent Results (from the past 240 hour(s))  Resp Panel by RT-PCR (Flu A&B, Covid) Nasopharyngeal Swab     Status: None   Collection Time: 08/07/21  4:13 AM   Specimen: Nasopharyngeal Swab; Nasopharyngeal(NP) swabs in vial transport medium  Result Value Ref Range Status   SARS Coronavirus 2 by RT PCR NEGATIVE NEGATIVE Final    Comment: (NOTE) SARS-CoV-2 target nucleic acids are NOT DETECTED.  The SARS-CoV-2 RNA is generally detectable in upper respiratory specimens during the acute phase of infection. The lowest concentration of SARS-CoV-2 viral copies this assay can detect is 138 copies/mL. A negative result does not preclude SARS-Cov-2 infection and should not be used as the sole basis for treatment or other patient management decisions. A negative result may occur with  improper specimen collection/handling, submission of specimen other than nasopharyngeal swab, presence of viral mutation(s) within the areas targeted by this assay, and inadequate number of viral copies(<138 copies/mL). A negative result must be combined with clinical observations, patient history, and epidemiological information. The expected result is Negative.  Fact Sheet for Patients:  EntrepreneurPulse.com.au  Fact Sheet for Healthcare Providers:  IncredibleEmployment.be  This test is no t yet approved or cleared by the [place] FDA and  has been authorized for detection and/or diagnosis of SARS-CoV-2 by FDA under an Emergency Use Authorization (  EUA). This EUA will remain  in effect (meaning this test can be used) for the duration of the COVID-19 declaration under Section 564(b)(1) of the Act, 21 U.S.C.section 360bbb-3(b)(1), unless the authorization is terminated  or revoked sooner.        Influenza A by PCR NEGATIVE NEGATIVE Final   Influenza B by PCR NEGATIVE NEGATIVE Final    Comment: (NOTE) The Xpert Xpress SARS-CoV-2/FLU/RSV plus assay is intended as an aid in the diagnosis of influenza from Nasopharyngeal swab specimens and should not be used as a sole basis for treatment. Nasal washings and aspirates are unacceptable for Xpert Xpress SARS-CoV-2/FLU/RSV testing.  Fact Sheet for Patients: EntrepreneurPulse.com.au  Fact Sheet for Healthcare Providers: IncredibleEmployment.be  This test is not yet approved or cleared by the [place] FDA and has been authorized for detection and/or diagnosis of SARS-CoV-2 by FDA under an Emergency Use Authorization (EUA). This EUA will remain in effect (meaning this test can be used) for the duration of the COVID-19 declaration under Section 564(b)(1) of the Act, 21 U.S.C. section 360bbb-3(b)(1), unless the authorization is terminated or revoked.  Performed at [facility], [address] [place], [place] [address]   Culture, blood (Routine X 2) w Reflex to ID Panel     Status: None   Collection Time: 08/07/21  4:36 AM   Specimen: BLOOD RIGHT HAND  Result Value Ref Range Status   Specimen Description BLOOD RIGHT HAND  Final   Special Requests   Final    BOTTLES DRAWN AEROBIC AND ANAEROBIC Blood Culture results may not be optimal due to an inadequate volume of blood received in culture bottles   Culture   Final    NO GROWTH 5 DAYS Performed at [facility], [address] [place], [place] [address]    Report Status 08/12/2021 FINAL  Final  Culture, blood (Routine X 2) w Reflex to ID Panel     Status: None   Collection Time: 08/07/21  5:07 AM   Specimen: BLOOD RIGHT FOREARM  Result Value Ref Range Status   Specimen Description BLOOD RIGHT FOREARM  Final   Special Requests   Final    BOTTLES DRAWN AEROBIC AND ANAEROBIC Blood Culture adequate volume   Culture    Final    NO GROWTH 5 DAYS Performed at [facility], [address], [place], [place] [address]    Report Status 08/12/2021 FINAL  Final  Expectorated Sputum Assessment w Gram Stain, Rflx to Resp Cult     Status: None   Collection Time: 08/07/21  8:32 PM   Specimen: Sputum  Result Value Ref Range Status   Specimen Description SPUTUM  Final   Special Requests NONE  Final   Sputum evaluation   Final    THIS SPECIMEN IS ACCEPTABLE FOR SPUTUM CULTURE Performed at [facility], [address] [place], [place] [address]    Report Status 08/07/2021 FINAL  Final  Culture, Respiratory w Gram Stain     Status: None   Collection Time: 08/07/21  8:32 PM   Specimen: SPU  Result Value Ref Range Status   Specimen Description   Final    SPUTUM Performed at [facility], [address] [place], [place] [address]    Special Requests   Final    NONE Reflexed from [id number] Performed at [facility], [address] [place], [place] [address]    Gram Stain   Final    MODERATE WBC PRESENT,BOTH PMN AND  MONONUCLEAR RARE YEAST FEW GRAM POSITIVE COCCI IN PAIRS FEW GRAM VARIABLE ROD    Culture   Final    FEW ACINETOBACTER BAUMANNII MODERATE DIPHTHEROIDS(CORYNEBACTERIUM SPECIES) Standardized susceptibility testing for this organism is not available. Performed at [facility], [place] [address], [place], [place] [address]    Report Status 08/10/2021 FINAL  Final   Organism ID, Bacteria ACINETOBACTER BAUMANNII  Final      Susceptibility   Acinetobacter baumannii - MIC*    CEFTAZIDIME 4 SENSITIVE Sensitive     CIPROFLOXACIN >=4 RESISTANT Resistant     GENTAMICIN 2 SENSITIVE Sensitive     IMIPENEM <=0.25 SENSITIVE Sensitive     PIP/TAZO <=4 SENSITIVE Sensitive     TRIMETH/SULFA <=20 SENSITIVE Sensitive     AMPICILLIN/SULBACTAM <=2 SENSITIVE Sensitive     * FEW ACINETOBACTER BAUMANNII  Body fluid culture w Gram Stain     Status: None   Collection Time:  08/08/21  9:45 AM   Specimen: PATH Cytology Pleural fluid  Result Value Ref Range Status   Specimen Description   Final    PLEURAL Performed at [facility], [address] [place], [place] [address]    Special Requests   Final    PLEURAL Performed at [facility], [address] [place], [place] [address]    Gram Stain   Final    WBC PRESENT, PREDOMINANTLY MONONUCLEAR NO ORGANISMS SEEN CYTOSPIN SMEAR    Culture   Final    NO GROWTH 3 DAYS Performed at [facility], [place] [address] [place], [place] [address]    Report Status 08/11/2021 FINAL  Final  Resp Panel by RT-PCR (Flu A&B, Covid) Nasopharyngeal Swab     Status: None   Collection Time: 08/12/21  6:25 AM   Specimen: Nasopharyngeal Swab; Nasopharyngeal(NP) swabs in vial transport medium  Result Value Ref Range Status   SARS Coronavirus 2 by RT PCR NEGATIVE NEGATIVE Final    Comment: (NOTE) SARS-CoV-2 target nucleic acids are NOT DETECTED.  The SARS-CoV-2 RNA is generally detectable in upper respiratory specimens during the acute phase of infection. The lowest concentration of SARS-CoV-2 viral copies this assay can detect is 138 copies/mL. A negative result does not preclude SARS-Cov-2 infection and should not be used as the sole basis for treatment or other patient management decisions. A negative result may occur with  improper specimen collection/handling, submission of specimen other than nasopharyngeal swab, presence of viral mutation(s) within the areas targeted by this assay, and inadequate number of viral copies(<138 copies/mL). A negative result must be combined with clinical observations, patient history, and epidemiological information. The expected result is Negative.  Fact Sheet for Patients:  EntrepreneurPulse.com.au  Fact Sheet for Healthcare Providers:  IncredibleEmployment.be  This test is no t yet approved or cleared by the [place] FDA and  has been authorized for detection and/or diagnosis of SARS-CoV-2 by FDA under an Emergency Use Authorization (EUA). This EUA will remain  in effect (meaning this test can be used) for the duration of the COVID-19 declaration under Section 564(b)(1) of the Act, 21 U.S.C.section 360bbb-3(b)(1), unless the authorization is terminated  or revoked sooner.       Influenza A by PCR NEGATIVE NEGATIVE Final   Influenza B by PCR NEGATIVE NEGATIVE Final    Comment: (NOTE) The Xpert Xpress SARS-CoV-2/FLU/RSV plus assay is intended as an aid in the diagnosis of influenza from Nasopharyngeal swab specimens and should not be used as a sole basis for  treatment. Nasal washings and aspirates are unacceptable for Xpert Xpress SARS-CoV-2/FLU/RSV testing.  Fact Sheet for Patients: EntrepreneurPulse.com.au  Fact Sheet for Healthcare Providers: IncredibleEmployment.be  This test is not yet approved or cleared by the [place] FDA and has been authorized for detection and/or diagnosis of SARS-CoV-2 by FDA under an Emergency Use Authorization (EUA). This EUA will remain in effect (meaning this test can be used) for the duration of the COVID-19 declaration under Section 564(b)(1) of the Act, 21 U.S.C. section 360bbb-3(b)(1), unless the authorization is terminated or revoked.  Performed at [facility], [address] [place], [place] [address]   Culture, blood (Routine X 2) w Reflex to ID Panel     Status: None (Preliminary result)   Collection Time: 08/13/21  7:31 PM   Specimen: BLOOD  Result Value Ref Range Status   Specimen Description BLOOD [facility]  Final   Special Requests   Final    BOTTLES DRAWN AEROBIC AND ANAEROBIC Blood Culture adequate volume   Culture   Final    NO GROWTH 3 DAYS Performed at [facility], [address], [place], [place] [address]    Report Status PENDING  Incomplete  Culture, blood (Routine X  2) w Reflex to ID Panel     Status: None (Preliminary result)   Collection Time: 08/13/21  7:50 PM   Specimen: BLOOD  Result Value Ref Range Status   Specimen Description BLOOD [facility]  Final   Special Requests   Final    BOTTLES DRAWN AEROBIC AND ANAEROBIC Blood Culture adequate volume   Culture   Final    NO GROWTH 3 DAYS Performed at [facility], [address], [place], [place] [address]    Report Status PENDING  Incomplete  Urine Culture     Status: None   Collection Time: 08/15/21 10:35 AM   Specimen: In/Out Cath Urine  Result Value Ref Range Status   Specimen Description   Final    IN/OUT CATH URINE Performed at [facility], [address], [place], [place] [address]    Special Requests   Final    NONE Performed at [facility], [address] [place], [place] [address]    Culture   Final    NO GROWTH Performed at [facility], [place] [address], [place], [place] [address]    Report Status 08/16/2021 FINAL  Final    Radiology Studies: No results found.  Val Riles, MD [facility]  If 7PM-7AM, please contact night-coverage [email or web address] 08/16/2021, 5:04 PM

## 2021-08-16 NOTE — Progress Notes (Deleted)
[facility]) Hospital Liaison note:  This patient is currently enrolled in [facility] outpatient-based Palliative Care. Will continue to follow for disposition.  Please call with any outpatient palliative questions or concerns.  Thank you, Lorelee Market, LPN [facility] Liaison [phone]

## 2021-08-16 NOTE — Progress Notes (Addendum)
[facility]) Hospital Liaison Note   Received request from Transitions of Care Manager Pricilla Riffle, LCSW for family interest in [facility]. Visited patient at bedside to confirm interest and explain services. Patient's nephew confirmed interest with Pricilla Riffle, LCSW and requested [facility] hospital liaison reach out after 5 pm to explain services to him as well. Patient chart and information reviewed by [facility] physician. [facility]ospice Home eligibility confirmed.    Unfortunately, Hospice Home is not able to offer a room today. Family and [facility] Manager aware hospital liaison will follow up tomorrow or sooner if a room becomes available.    Please do not hesitate to call with any hospice related questions.    Thank you for the opportunity to participate in this patient's care.   Bobbie "Loren Racer, RN, BSN [facility] Liaison [phone]

## 2021-08-16 NOTE — Progress Notes (Signed)
IR brief note   I spoke to Dr. Dwyane Dee with respect to thoracentesis request. I reviewed her most recent CT, which shows extensive mucous plugging of the left lower lobe which is subsequently collapsed. A thoracentesis would not help re-expand the lung, and would likely result in a pneumothorax ex vacuo (trapped lung). The patient would most benefit from bronchoscopy if an appropriate candidate. If still dyspneic after therapeutic bronch, can consider thoracentesis.   Please reach back out to IR with any questions.   Albin Felling, MD  Vascular and Interventional Radiology 08/16/2021 2:32 PM

## 2021-08-16 NOTE — Progress Notes (Signed)
   08/15/21 2003  Assess: MEWS Score  Temp 97.7 F (36.5 C)  BP (!) 138/108  Pulse Rate (!) 119  ECG Heart Rate (!) 160  Resp (!) 36  Level of Consciousness Alert  SpO2 (!) 80 %  O2 Device Non-rebreather Mask (RT at bedside)  Patient Activity (if Appropriate) In bed  O2 Flow Rate (L/min) 15 L/min  Assess: MEWS Score  MEWS Temp 0  MEWS Systolic 0  MEWS Pulse 3  MEWS RR 3  MEWS LOC 0  MEWS Score 6  MEWS Score Color Red  Assess: if the MEWS score is Yellow or Red  Were vital signs taken at a resting state? Yes  Focused Assessment No change from prior assessment  Provider and Rapid Response Notified? Yes (Rapid and charge RN at bedside)  MEWS guidelines implemented *See Row Information* Yes  Treat  MEWS Interventions Administered prn meds/treatments;Consulted Respiratory Therapy;Escalated (See documentation below)  Pain Scale Faces  Faces Pain Scale 6  Pain Intervention(s) Medication (See eMAR)  Take Vital Signs  Increase Vital Sign Frequency  Red: Q 1hr X 4 then Q 4hr X 4, if remains red, continue Q 4hrs  Escalate  MEWS: Escalate Red: discuss with charge nurse/RN and provider, consider discussing with RRT  Notify: Charge Nurse/RN  Name of Charge Nurse/RN Notified Felicia CRN  Date Charge Nurse/RN Notified 08/15/21  Time Charge Nurse/RN Notified 1950  Notify: Rapid Response  Name of Rapid Response RN Notified [facility] CRN  Date Rapid Response Notified 08/15/21  Time Rapid Response Notified 1930  Assess: SIRS CRITERIA  SIRS Temperature  0  SIRS Pulse 1  SIRS Respirations  1  SIRS WBC 0  SIRS Score Sum  2  MEWS is RED, Agricultural consultant and MD Mansy notified. Called RT and Rapid to bedside. Pt is alert and yelling hurt, but unable to tell the location. RT put pt on NRM at 15L. Dr Sidney Ace prescribe Morphine IV and Metoprolol IV, RN administered right after. Will keep monitoring.

## 2021-08-17 DIAGNOSIS — J9621 Acute and chronic respiratory failure with hypoxia: Secondary | ICD-10-CM | POA: Diagnosis not present

## 2021-08-17 LAB — BASIC METABOLIC PANEL
Anion gap: 13 (ref 5–15)
BUN: 35 mg/dL — ABNORMAL HIGH (ref 8–23)
CO2: 37 mmol/L — ABNORMAL HIGH (ref 22–32)
Calcium: 9.1 mg/dL (ref 8.9–10.3)
Chloride: 82 mmol/L — ABNORMAL LOW (ref 98–111)
Creatinine, Ser: 0.63 mg/dL (ref 0.44–1.00)
GFR, Estimated: >60 mL/min (ref >60)
Glucose, Bld: 156 mg/dL — ABNORMAL HIGH (ref 70–99)
Potassium: 2.9 mmol/L — ABNORMAL LOW (ref 3.5–5.1)
Sodium: 132 mmol/L — ABNORMAL LOW (ref 135–145)

## 2021-08-17 LAB — CBC
HCT: 39.1 % (ref 36.0–46.0)
Hemoglobin: 13.6 g/dL (ref 12.0–15.0)
MCH: 32.6 pg (ref 26.0–34.0)
MCHC: 34.8 g/dL (ref 30.0–36.0)
MCV: 93.8 fL (ref 80.0–100.0)
Platelets: 225 10*3/uL (ref 150–400)
RBC: 4.17 MIL/uL (ref 3.87–5.11)
RDW: 14.5 % (ref 11.5–15.5)
WBC: 17.1 10*3/uL — ABNORMAL HIGH (ref 4.0–10.5)
nRBC: 0 % (ref 0.0–0.2)

## 2021-08-17 LAB — PHOSPHORUS: Phosphorus: 2.6 mg/dL (ref 2.5–4.6)

## 2021-08-17 LAB — MAGNESIUM: Magnesium: 1.7 mg/dL (ref 1.7–2.4)

## 2021-08-17 MED ORDER — POTASSIUM CHLORIDE 10 MEQ/100ML IV SOLN
10.0000 meq | INTRAVENOUS | Status: AC
  Administered 2021-08-17 (×5): 10 meq via INTRAVENOUS
  Filled 2021-08-17 (×6): qty 100

## 2021-08-17 MED ORDER — POTASSIUM CHLORIDE CRYS ER 20 MEQ PO TBCR
40.0000 meq | EXTENDED_RELEASE_TABLET | Freq: Once | ORAL | Status: AC
  Administered 2021-08-17: 40 meq via ORAL
  Filled 2021-08-17: qty 2

## 2021-08-17 NOTE — Progress Notes (Signed)
[facility]) Hospital Liaison Note    Unfortunately, Hospice Home is not able to offer a room today. Family and [facility] Manager aware hospital liaison will follow up tomorrow or sooner if a room becomes available.    Please do not hesitate to call with any hospice related questions.    Thank you for the opportunity to participate in this patient's care.   Bobbie "Loren Racer, RN, BSN [facility] Liaison [phone]

## 2021-08-17 NOTE — Progress Notes (Signed)
Having PROGRESS NOTE  Felicia Sellers TFT:[id number] DOB: July 24, 1941   PCP: [facility]  Patient is from: Home.  Reportedly independent for most ADLs at baseline.  DOA: 08/12/2021 LOS: 5  Chief complaints:  Chief Complaint  Patient presents with   Respiratory Distress    Pt in via EMS from pt's home for SOB. EMS report pt's SPO2 at home on 4l 91%. EMS report pt's family states they can't take care of her anymore. EMS place pt on 6l n/c 96%.     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 fib not on AC, stomach ulcer, CAD, diastolic CHF, lung cancer s/p right lobectomy, tobacco use disorder, alcohol abuse, ovarian mass, chronic hyponatremia, severe malnutrition, HTN, HLD and recurrent hospitalization with respiratory failure returning with shortness of breath and admitted with acute on chronic RF with hypoxia due to COPD and CHF exacerbation.  Initially required BiPAP.  Started on IV Zosyn, IV Lasix, steroid and breathing treatments.  Subjective: No significant overnight events, patient shortness of breath, productive cough but feels little bit improvement.  Patient has significant sensorineural deafness, patient was advised that we will treat her today and keep her in the hospital.  Plan for disposition tomorrow a.m. to the hospice.   Objective: Vitals:   08/17/21 0841 08/17/21 1132 08/17/21 1444 08/17/21 1601  BP: 128/81 119/75  127/75  Pulse: 84 78 76 88  Resp:   20 18  Temp: (!) 97.5 F (36.4 C) 97.9 F (36.6 C)  98.4 F (36.9 C)  TempSrc: Oral Oral  Oral  SpO2: 93% 100% 97% 100%  Weight:      Height:        Intake/Output Summary (Last 24 hours) at 08/17/2021 1644 Last data filed at 08/17/2021 1521 Gross per 24 hour  Intake 1130.48 ml  Output 1100 ml  Net 30.48 ml   Filed Weights   08/15/21 0253 08/16/21 0500 08/17/21 0500  Weight: 49.9 kg 48.2 kg 48 kg    Examination:  GENERAL: Frail looking elderly  female. In mild respiratory distress HEENT: MMM.  Hard of hearing bilateral SNHL NECK: Supple.  No apparent JVD.  RESP: rhonchi and rales throughout CVS:  RRR. Heart sounds normal.  ABD/GI/GU: BS+. Abd soft, mild tenderness suprapubically MSK/EXT:  Moves extremities. No apparent deformity. No edema.  SKIN: no apparent skin lesion or wound NEURO: Awake and alert.  PSYCH: Calm. Normal affect.     Procedures:  None  Microbiology summarized: [id number] and influenza PCR nonreactive. Sputum culture pending. Blood culture pending.  Assessment & Plan: Acute on chronic respiratory failure with hypoxia due to combination of COPD, CHF, A. fib, pleural effusion and underlying lung cancer.  Third hospitalization for the same issue in less than 5 weeks.  She was discharged on 10/16 on p.o. Augmentin, Bactrim and 4 L by [place].  Currently saturating at 99% on 4L. Worsening respirations this am. Patient appears to be deteriorating. CT that resulted this afternoon shows reaccumulation of left sided pleural effusion. Previous cultures/cytology neg - cont o2, abx, steroids - abg ordered, may need bipap - palliative consulted - repeat diagnostic/therapeutic thoracentesis was ordered but as per IR, suggested that patient have To mucous plugging in the left lower lobe which is subsequently collapsed, thoracentesis will not help so it was not done.   Urinary retention Symptomatic. Retaining 1100 this morning, s/p I/o cath. Urinary retention persisted throughout the day, most recent pvr 700 ml -  place foley   Acute on chronic diastolic CHF (congestive heart failure): TTE in 06/2021 with LVEF of 55 to 60% (from 70 to 75% in 2021) and moderate TVR. 07/09/2021 showed EF of 55 to 60%.  She had edema, JVD, rhonchi and rales on exam.  CXR with pulmonary edema and increased pleural effusion.  BNP elevated to 770.  Started on IV Lasix.  She had 2.1 L UOP/24 hours.   -Continue IV Lasix 40 mg twice daily -Discontinued  amlodipine and lisinopril to allow room for diuresis. -Monitor fluid status, renal functions and electrolytes. -Sodium and fluid restrictions -REDs Vest reading   COPD exacerbation: Sputum culture grew Acinetobacter and moderate diphtheroid prior hospitalization.  She was discharged on Augmentin and Bactrim.  Remains rhonchorous on exam.  Has leukocytosis but Pro-Cal negative.  Ongoing tobacco use.  -Discontinued Augmentin and Bactrim -Changed since Zosyn to IV cefepime and azithromycin -Continue IV Solu-Medrol and bronchodilators -Continue mucolytic's/antitussive/IS -OOB/PT/OT -Titrate oxygen for saturation above 90% - sputum cultures NGTD  Persistent A. fib with RVR: RVR resolved.  Not on anticoagulation likely due to history of stomach ulcer.  On Toprol-XL at home. -Metoprolol 25 mg twice daily, increase to 50  Acute metabolic encephalopathy: Resolved.   -Reorientation delirium precautions. -Minimize or avoid sedating medications  Dysphagia: Likely in the setting of encephalopathy. -Continue dysphagia 3 diet -SLP eval   Sepsis ruled out.    Essential hypertension: Normotensive. -IV Lasix and metoprolol as above -Discontinued amlodipine and lisinopril   Tobacco use disorder: Reportedly smokes about a pack a day. -Cessation counseling when able to comprehend. -Continue nicotine patch  Alcohol abuse-Per recent discharge summary, sober over the last 1 month. Has had intermittent ativan here but no clear signs of withdrawal. -Continue CIWA, multivitamin, folic acid and thiamine   Personal history of lung cancer -s/p of right lobectomy, 1/3 of right lung   Chronic hyponatremia: Improved.  Could be due to CHF and Bactrim. -Continue IV Lasix -Fluid and sodium restriction   Elevated troponin and history of CAD: Troponin is minimally elevated 19 > 23 > 18.  Likely demand ischemia -Cardiac meds as above -Continue home statin.  Hypokalemia:  Potassium repleted, monitor and  replete as needed.   Physical deconditioning -PT/OT recommended SNF.   Goal of care counseling-appropriately DNR/DNI.  A lot of comorbidity.  Poor long-term prognosis. I am concerned patient is nearing death. -Palliative medicine consulted on admission Discussion was done with patient's family who made the decision for hospice care, patient has no capacity to make the decision.     Severe malnutrition: As evidenced by low BMI and significant muscle mass and subcu fat loss. Body mass index is 18.75 kg/m. Nutrition Problem: Severe Malnutrition Etiology: chronic illness (COPD, CHF, lung cancer, etoh abuse)-Consult dietitian Signs/Symptoms: severe fat depletion, severe muscle depletion     DVT prophylaxis:  heparin injection 5,000 Units Start: 08/13/21 1400  Code Status: DNR/DNI Family Communication: nephew who is next of kin updated today on patient's tenuous status Level of care: Med-Surg Status is: Inpatient  Remains inpatient appropriate because: IV medication and need for palliative medicine safe disposition Patient will be discharged to hospice care when bed will be available   Consultants:  Palliative medicine   Sch Meds:  Scheduled Meds:  atorvastatin  40 mg Oral QHS   calcium carbonate  1 tablet Oral BID WC   Chlorhexidine Gluconate Cloth  6 each Topical Daily   cholecalciferol  1,000 Units Oral Daily   docusate sodium  100 mg Oral Daily   feeding supplement (NEPRO CARB STEADY)  237 mL Oral TID BM   folic acid  1 mg Oral Daily   furosemide  40 mg Intravenous Q12H   gabapentin  100 mg Oral TID   guaiFENesin  600 mg Oral BID   heparin  5,000 Units Subcutaneous Q8H   ipratropium-albuterol  3 mL Nebulization Q6H   methylPREDNISolone (SOLU-MEDROL) injection  40 mg Intravenous Q12H   metoprolol tartrate  50 mg Oral BID   multivitamin with minerals  1 tablet Oral Daily   nicotine  21 mg Transdermal Daily   potassium chloride  40 mEq Oral Once   thiamine  100 mg  Oral Daily   Or   thiamine  100 mg Intravenous Daily   Continuous Infusions:  sodium chloride Stopped (08/13/21 1517)   ceFEPime (MAXIPIME) IV 200 mL/hr at 08/17/21 1024   diltiazem (CARDIZEM) infusion 5 mg/hr (08/17/21 1305)   potassium chloride 10 mEq (08/17/21 1559)   PRN Meds:.sodium chloride, acetaminophen, albuterol, dextromethorphan-guaiFENesin, diclofenac Sodium, loperamide, morphine injection, ondansetron (ZOFRAN) IV  Antimicrobials: Anti-infectives (From admission, onward)    Start     Dose/Rate Route Frequency Ordered Stop   08/13/21 1400  amoxicillin-clavulanate (AUGMENTIN) 500-125 MG per tablet 500 mg  Status:  Discontinued        1 tablet Oral Every 8 hours 08/13/21 1110 08/13/21 1233   08/13/21 1400  azithromycin (ZITHROMAX) 500 mg in sodium chloride 0.9 % 250 mL IVPB        500 mg 250 mL/hr over 60 Minutes Intravenous Every 24 hours 08/13/21 1254 08/15/21 1350   08/13/21 1345  ceFEPIme (MAXIPIME) 2 g in sodium chloride 0.9 % 100 mL IVPB        2 g 200 mL/hr over 30 Minutes Intravenous Every 12 hours 08/13/21 1330 08/19/21 2359   08/13/21 1300  metroNIDAZOLE (FLAGYL) IVPB 500 mg  Status:  Discontinued        500 mg 100 mL/hr over 60 Minutes Intravenous Every 12 hours 08/13/21 1254 08/13/21 1254   08/13/21 1115  sulfamethoxazole-trimethoprim (BACTRIM DS) 800-160 MG per tablet 1 tablet  Status:  Discontinued        1 tablet Oral Every 12 hours 08/13/21 1110 08/13/21 1253   08/12/21 1430  piperacillin-tazobactam (ZOSYN) IVPB 3.375 g  Status:  Discontinued        3.375 g 12.5 mL/hr over 240 Minutes Intravenous Every 8 hours 08/12/21 1406 08/13/21 1254        I have personally reviewed the following labs and images: CBC: Recent Labs  Lab 08/12/21 0840 08/14/21 0624 08/15/21 0444 08/16/21 0529 08/17/21 0350  WBC 17.7* 9.6 14.0* 13.9* 17.1*  NEUTROABS  --  7.1  --   --   --   HGB 13.6 14.4 15.3* 14.5 13.6  HCT 39.5 42.1 43.0 43.5 39.1  MCV 95.4 92.7 93.3 94.4  93.8  PLT 286 281 266 226 225   BMP &GFR Recent Labs  Lab 08/12/21 0642 08/12/21 0911 08/13/21 1226 08/14/21 0624 08/15/21 0444 08/16/21 0529 08/17/21 0350  NA  --    < > 131* 132* 134* 135 132*  K  --    < > 3.7 3.7 3.5 3.4* 2.9*  CL  --    < > 85* 86* 83* 85* 82*  CO2  --    < > 34* 35* 33* 37* 37*  GLUCOSE  --    < > 98 96 124* 120* 156*  BUN  --    < >  17 19 29* 29* 35*  CREATININE  --    < > 0.75 0.60 0.78 0.61 0.63  CALCIUM  --    < > 8.5* 8.4* 8.8* 9.1 9.1  MG 2.1  --  1.9 1.8 2.2  --  1.7  PHOS 2.2*  --   --  4.2 4.0  --  2.6   < > = values in this interval not displayed.   Estimated Creatinine Clearance: 41.1 mL/min (by C-G formula based on SCr of 0.63 mg/dL). Liver & Pancreas: Recent Labs  Lab 08/12/21 0625 08/14/21 0624 08/15/21 0444 08/16/21 0529  AST 23  --   --  14*  ALT 40  --   --  21  ALKPHOS 159*  --   --  85  BILITOT 1.1  --   --  1.4*  PROT 8.2*  --   --  6.0*  ALBUMIN 4.7 3.4* 3.8 3.4*   No results for input(s): LIPASE, AMYLASE in the last 168 hours. Recent Labs  Lab 08/14/21 0624  AMMONIA 17   Diabetic: No results for input(s): HGBA1C in the last 72 hours.  Recent Labs  Lab 08/10/21 2133  GLUCAP 107*   Cardiac Enzymes: No results for input(s): CKTOTAL, CKMB, CKMBINDEX, TROPONINI in the last 168 hours. No results for input(s): PROBNP in the last 8760 hours. Coagulation Profile: No results for input(s): INR, PROTIME in the last 168 hours. Thyroid Function Tests: No results for input(s): TSH, T4TOTAL, FREET4, T3FREE, THYROIDAB in the last 72 hours.  Lipid Profile: No results for input(s): CHOL, HDL, LDLCALC, TRIG, CHOLHDL, LDLDIRECT in the last 72 hours.  Anemia Panel: No results for input(s): VITAMINB12, FOLATE, FERRITIN, TIBC, IRON, RETICCTPCT in the last 72 hours. Urine analysis:    Component Value Date/Time   COLORURINE STRAW (A) 08/15/2021 1055   APPEARANCEUR CLEAR (A) 08/15/2021 1055   APPEARANCEUR Clear 05/28/2014 0855    LABSPEC 1.008 08/15/2021 1055   LABSPEC 1.013 05/28/2014 0855   PHURINE 6.0 08/15/2021 1055   GLUCOSEU NEGATIVE 08/15/2021 1055   GLUCOSEU Negative 05/28/2014 0855   HGBUR NEGATIVE 08/15/2021 1055   BILIRUBINUR NEGATIVE 08/15/2021 1055   BILIRUBINUR Negative 05/28/2014 0855   KETONESUR NEGATIVE 08/15/2021 1055   PROTEINUR NEGATIVE 08/15/2021 1055   NITRITE NEGATIVE 08/15/2021 1055   LEUKOCYTESUR NEGATIVE 08/15/2021 1055   LEUKOCYTESUR Negative 05/28/2014 0855   Sepsis Labs: Invalid input(s): PROCALCITONIN, [place]  Microbiology: Recent Results (from the past 240 hour(s))  Expectorated Sputum Assessment w Gram Stain, Rflx to Resp Cult     Status: None   Collection Time: 08/07/21  8:32 PM   Specimen: Sputum  Result Value Ref Range Status   Specimen Description SPUTUM  Final   Special Requests NONE  Final   Sputum evaluation   Final    THIS SPECIMEN IS ACCEPTABLE FOR SPUTUM CULTURE Performed at [facility], [address] [place], [place] [address]    Report Status 08/07/2021 FINAL  Final  Culture, Respiratory w Gram Stain     Status: None   Collection Time: 08/07/21  8:32 PM   Specimen: SPU  Result Value Ref Range Status   Specimen Description   Final    SPUTUM Performed at [facility], [address] [place], [place] [address]    Special Requests   Final    NONE Reflexed from [id number] Performed at [facility], [address] [place], [place] [address]    Gram Stain   Final    MODERATE  WBC PRESENT,BOTH PMN AND MONONUCLEAR RARE YEAST FEW GRAM POSITIVE COCCI IN PAIRS FEW GRAM VARIABLE ROD    Culture   Final    FEW ACINETOBACTER BAUMANNII MODERATE DIPHTHEROIDS(CORYNEBACTERIUM SPECIES) Standardized susceptibility testing for this organism is not available. Performed at [facility], [place] [address] [place], [place] [address]    Report Status 08/10/2021 FINAL  Final   Organism ID, Bacteria ACINETOBACTER BAUMANNII  Final       Susceptibility   Acinetobacter baumannii - MIC*    CEFTAZIDIME 4 SENSITIVE Sensitive     CIPROFLOXACIN >=4 RESISTANT Resistant     GENTAMICIN 2 SENSITIVE Sensitive     IMIPENEM <=0.25 SENSITIVE Sensitive     PIP/TAZO <=4 SENSITIVE Sensitive     TRIMETH/SULFA <=20 SENSITIVE Sensitive     AMPICILLIN/SULBACTAM <=2 SENSITIVE Sensitive     * FEW ACINETOBACTER BAUMANNII  Body fluid culture w Gram Stain     Status: None   Collection Time: 08/08/21  9:45 AM   Specimen: PATH Cytology Pleural fluid  Result Value Ref Range Status   Specimen Description   Final    PLEURAL Performed at [facility], [address], [place], [place] [address]    Special Requests   Final    PLEURAL Performed at [facility], [address] [place], [place] [address]    Gram Stain   Final    WBC PRESENT, PREDOMINANTLY MONONUCLEAR NO ORGANISMS SEEN CYTOSPIN SMEAR    Culture   Final    NO GROWTH 3 DAYS Performed at [facility], [place] [address], [place],  [address]    Report Status 08/11/2021 FINAL  Final  Resp Panel by RT-PCR (Flu A&B, Covid) Nasopharyngeal Swab     Status: None   Collection Time: 08/12/21  6:25 AM   Specimen: Nasopharyngeal Swab; Nasopharyngeal(NP) swabs in vial transport medium  Result Value Ref Range Status   SARS Coronavirus 2 by RT PCR NEGATIVE NEGATIVE Final    Comment: (NOTE) SARS-CoV-2 target nucleic acids are NOT DETECTED.  The SARS-CoV-2 RNA is generally detectable in upper respiratory specimens during the acute phase of infection. The lowest concentration of SARS-CoV-2 viral copies this assay can detect is 138 copies/mL. A negative result does not preclude SARS-Cov-2 infection and should not be used as the sole basis for treatment or other patient management decisions. A negative result may occur with  improper specimen collection/handling, submission of specimen other than nasopharyngeal swab, presence of viral mutation(s) within  the areas targeted by this assay, and inadequate number of viral copies(<138 copies/mL). A negative result must be combined with clinical observations, patient history, and epidemiological information. The expected result is Negative.  Fact Sheet for Patients:  EntrepreneurPulse.com.au  Fact Sheet for Healthcare Providers:  IncredibleEmployment.be  This test is no t yet approved or cleared by the [place] FDA and  has been authorized for detection and/or diagnosis of SARS-CoV-2 by FDA under an Emergency Use Authorization (EUA). This EUA will remain  in effect (meaning this test can be used) for the duration of the COVID-19 declaration under Section 564(b)(1) of the Act, 21 U.S.C.section 360bbb-3(b)(1), unless the authorization is terminated  or revoked sooner.       Influenza A by PCR NEGATIVE NEGATIVE Final   Influenza B by PCR NEGATIVE NEGATIVE Final    Comment: (NOTE) The Xpert Xpress SARS-CoV-2/FLU/RSV plus assay is intended as an aid in the diagnosis of influenza from Nasopharyngeal swab specimens and should not be used as  a sole basis for treatment. Nasal washings and aspirates are unacceptable for Xpert Xpress SARS-CoV-2/FLU/RSV testing.  Fact Sheet for Patients: EntrepreneurPulse.com.au  Fact Sheet for Healthcare Providers: IncredibleEmployment.be  This test is not yet approved or cleared by the [place] FDA and has been authorized for detection and/or diagnosis of SARS-CoV-2 by FDA under an Emergency Use Authorization (EUA). This EUA will remain in effect (meaning this test can be used) for the duration of the COVID-19 declaration under Section 564(b)(1) of the Act, 21 U.S.C. section 360bbb-3(b)(1), unless the authorization is terminated or revoked.  Performed at [facility], [address] [place], [place] [address]   Culture, blood (Routine X 2) w Reflex to ID Panel      Status: None (Preliminary result)   Collection Time: 08/13/21  7:31 PM   Specimen: BLOOD  Result Value Ref Range Status   Specimen Description BLOOD [facility]  Final   Special Requests   Final    BOTTLES DRAWN AEROBIC AND ANAEROBIC Blood Culture adequate volume   Culture   Final    NO GROWTH 4 DAYS Performed at [facility], [address] [place], [place] [address]    Report Status PENDING  Incomplete  Culture, blood (Routine X 2) w Reflex to ID Panel     Status: None (Preliminary result)   Collection Time: 08/13/21  7:50 PM   Specimen: BLOOD  Result Value Ref Range Status   Specimen Description BLOOD [facility]  Final   Special Requests   Final    BOTTLES DRAWN AEROBIC AND ANAEROBIC Blood Culture adequate volume   Culture   Final    NO GROWTH 4 DAYS Performed at [facility], [address], [place], [place] [address]    Report Status PENDING  Incomplete  Urine Culture     Status: None   Collection Time: 08/15/21 10:35 AM   Specimen: In/Out Cath Urine  Result Value Ref Range Status   Specimen Description   Final    IN/OUT CATH URINE Performed at [facility], [address], [place], [place] [address]    Special Requests   Final    NONE Performed at [facility], [address], [place], [place] [address]    Culture   Final    NO GROWTH Performed at [facility], [place] [address], [place], [place] [address]    Report Status 08/16/2021 FINAL  Final    Radiology Studies: No results found.  Val Riles, MD [facility]  If 7PM-7AM, please contact night-coverage [email or web address] 08/17/2021, 4:44 PM

## 2021-08-17 NOTE — TOC Progression Note (Signed)
Transition of Care [facility]) - Progression Note    Patient Details  Name: Felicia Sellers MRN: [id number] Date of Birth: 1941/07/08  Transition of Care [facility]) CM/SW [place], [place] Phone Number: 08/17/2021, 8:46 AM  Clinical Narrative:     CSW notes no hospice home beds available yesterday 10/25 although patient does qualify. CSW has followed up with [facility] this morning for any updates on hospice home bed availability.   Pending hospice home bed at this time.        Expected Discharge Plan and Services                                                 Social Determinants of Health (SDOH) Interventions    Readmission Risk Interventions No flowsheet data found.

## 2021-08-17 NOTE — Progress Notes (Signed)
SLP Cancellation Note  Patient Details Name: Felicia Sellers MRN: [id number] DOB: Feb 21, 1941   Cancelled treatment:       Reason Eval/Treat Not Completed:  (chart reviewed). Per MD notes today, plan for disposition tomorrow a.m. to the Hospice Home. Patient has shortness of breath, productive cough but feels little bit improvement today. She is on a dysphagia diet currently including Nectar liquids. Pt's GOC re: her diet can be discussed at Discharge to [facility]. Recommend aspiration precautions; oral care.  MD to reconsult if any new needs while admitted.      Orinda Kenner, MS, CCC-SLP Speech Language Pathologist Rehab Services [phone] [facility] 08/17/2021, 5:33 PM

## 2021-08-18 ENCOUNTER — Encounter: Payer: Self-pay | Admitting: Internal Medicine

## 2021-08-18 DIAGNOSIS — J9621 Acute and chronic respiratory failure with hypoxia: Secondary | ICD-10-CM | POA: Diagnosis not present

## 2021-08-18 LAB — CBC
HCT: 38.7 % (ref 36.0–46.0)
Hemoglobin: 13.9 g/dL (ref 12.0–15.0)
MCH: 32.9 pg (ref 26.0–34.0)
MCHC: 35.9 g/dL (ref 30.0–36.0)
MCV: 91.7 fL (ref 80.0–100.0)
Platelets: 208 10*3/uL (ref 150–400)
RBC: 4.22 MIL/uL (ref 3.87–5.11)
RDW: 14.1 % (ref 11.5–15.5)
WBC: 22.1 10*3/uL — ABNORMAL HIGH (ref 4.0–10.5)
nRBC: 0 % (ref 0.0–0.2)

## 2021-08-18 LAB — BASIC METABOLIC PANEL
Anion gap: 9 (ref 5–15)
BUN: 32 mg/dL — ABNORMAL HIGH (ref 8–23)
CO2: 37 mmol/L — ABNORMAL HIGH (ref 22–32)
Calcium: 8.9 mg/dL (ref 8.9–10.3)
Chloride: 80 mmol/L — ABNORMAL LOW (ref 98–111)
Creatinine, Ser: 0.61 mg/dL (ref 0.44–1.00)
GFR, Estimated: >60 mL/min (ref >60)
Glucose, Bld: 112 mg/dL — ABNORMAL HIGH (ref 70–99)
Potassium: 3.3 mmol/L — ABNORMAL LOW (ref 3.5–5.1)
Sodium: 126 mmol/L — ABNORMAL LOW (ref 135–145)

## 2021-08-18 LAB — CULTURE, BLOOD (ROUTINE X 2)
Culture: NO GROWTH
Culture: NO GROWTH
Special Requests: ADEQUATE
Special Requests: ADEQUATE

## 2021-08-18 LAB — MAGNESIUM: Magnesium: 1.6 mg/dL — ABNORMAL LOW (ref 1.7–2.4)

## 2021-08-18 LAB — PHOSPHORUS: Phosphorus: 2.7 mg/dL (ref 2.5–4.6)

## 2021-08-18 MED ORDER — GUAIFENESIN ER 600 MG PO TB12: 600.0000 mg | ORAL_TABLET | Freq: Two times a day (BID) | ORAL | 0 refills | Status: AC

## 2021-08-18 MED ORDER — AMOXICILLIN-POT CLAVULANATE 500-125 MG PO TABS: 1.0000 | ORAL_TABLET | Freq: Three times a day (TID) | ORAL | 0 refills | Status: AC

## 2021-08-18 MED ORDER — METOPROLOL SUCCINATE ER 50 MG PO TB24: 50.0000 mg | ORAL_TABLET | Freq: Every day | ORAL | Status: AC

## 2021-08-18 MED ORDER — POTASSIUM CHLORIDE CRYS ER 20 MEQ PO TBCR
40.0000 meq | EXTENDED_RELEASE_TABLET | ORAL | Status: AC
  Administered 2021-08-18 (×2): 40 meq via ORAL
  Filled 2021-08-18 (×2): qty 2

## 2021-08-18 MED ORDER — DM-GUAIFENESIN ER 30-600 MG PO TB12: 1.0000 | ORAL_TABLET | Freq: Two times a day (BID) | ORAL | Status: AC | PRN

## 2021-08-18 MED ORDER — MAGNESIUM SULFATE 2 GM/50ML IV SOLN
2.0000 g | Freq: Once | INTRAVENOUS | Status: AC
  Administered 2021-08-18: 2 g via INTRAVENOUS
  Filled 2021-08-18: qty 50

## 2021-08-18 MED ORDER — PREDNISONE 20 MG PO TABS: 40.0000 mg | ORAL_TABLET | Freq: Every day | ORAL | 0 refills | Status: AC

## 2021-08-18 MED ORDER — FOLIC ACID 1 MG PO TABS: 1.0000 mg | ORAL_TABLET | Freq: Every day | ORAL | 2 refills | Status: AC

## 2021-08-18 NOTE — Progress Notes (Signed)
CSW received a transferred phone call from 2A which was Mattie Marlin inquiring as to information on this patient.   [name] spoke with this CSW and informed this CSW that no information should be provided to family member Belenda Cruise.   Belenda Cruise is also not noted to be on chart as any point of contact. Only point of contact patient has listed for [place] to communicate with is [name].   Please only communicate with [name] unless otherwise instructed by him.   [place], [place]

## 2021-08-18 NOTE — Progress Notes (Signed)
Pt c/o abdominal pain, tender to touch, distended. Bladder scan yielded greater than 339ml/MD made aware/orders to reinsert foley for bladder retention and continue to monitor.

## 2021-08-18 NOTE — Progress Notes (Signed)
Nutrition Brief Note  Chart reviewed. Pt now transitioning to comfort care. Plan to discharge to residential hospice once bed is available. No further nutrition interventions planned at this time.  Please re-consult as needed.   Loistine Chance, RD, LDN, [place] Registered Dietitian II Certified Diabetes Care and Education Specialist Please refer to [facility] for RD and/or RD on-call/weekend/after hours pager

## 2021-08-18 NOTE — Care Management Important Message (Signed)
Important Message  Patient Details  Name: Felicia Sellers MRN: [id number] Date of Birth: Mar 08, 1941   Medicare Important Message Given:  Other (see comment)  Transferring to Hospice Home.  Medicare IM withheld at this time.    Dannette Barbara 08/18/2021, 1:55 PM

## 2021-08-18 NOTE — Progress Notes (Signed)
Pt pulled out foley/balloon intact. MD made aware/ok to keep out for now. Monitor output.

## 2021-08-18 NOTE — Progress Notes (Signed)
[facility] ([facility])  There is a bed today at [facility] for Felicia Sellers.  Waiting speak with her son [name] to obtain necessary consents, and then transport can be arranged.  [facility] will update TOC once we are able to reach [place].  Thank you, Venia Carbon BSN, RN [facility] Liaison

## 2021-08-18 NOTE — Progress Notes (Addendum)
[employer]ufacturing [employer] ([facility])  Contact made with [name] regarding accepting a bed at [facility].   Explained that there are several necessary consents that Medicare/Medicaid necessitate and she cannot be moved until they are completed.  [name] is unable to come to meet our social worker until Saturday, due to his hectic work schedule.   Explored possibility of completing them electronically, he advised that he does not know his e-mail address and does not have access to a computer.   Ms. Lantis has no other living family. She has lived with [name] for the last 15 years after his parents (who were her caregivers) passed away.  [facility] Housekeeper that we cannot hold a bed for Ms. Kirsch until Saturday and we would have to move to the next person on the list. He understands and apologizes but cannot flex his availability in any capacity.  [facility] will continue to keep her on our list but without his written consent, we cannot accept her.  Thank you, Venia Carbon BSN, RN [facility] Liaison

## 2021-08-18 NOTE — Progress Notes (Signed)
Physical Therapy Treatment Patient Details Name: Felicia Sellers MRN: [id number] DOB: April 05, 1941 Today's Date: 08/18/2021   History of Present Illness Pt is an 80 y/o F admitted on 08/12/21 with c/c of SOB. Pt recently hospitalized 10/16-10/20 2/2 COPD exacerbation & L pleural effusion, undergoing thoracentesis. Pt is currently being treated for acute on chronic respiratory failure with hypoxia due to combination of COPD & CHF exacerbation & sepsis 2/2 COPD exacerbation. PMH: COPD on 4L O2, HTN, HLD, a-fib on anticoagulants, stomach ulcer, CAD, CHF, lung CA s/p R lobectomy, tobacco & alcohol abuse    PT Comments    Pt seen briefly this pm for bed mobility, repositioning, and modified LE exercises/ROM to tolerance.  Pt remained on 4L O2, c/o abdominal discomfort - nursing aware. Pt left in upright sitting in bed with call bell in reach. Pt awaiting transition to [facility].     Recommendations for follow up therapy are one component of a multi-disciplinary discharge planning process, led by the attending physician.  Recommendations may be updated based on patient status, additional functional criteria and insurance authorization.  Follow Up Recommendations  Skilled nursing-short term rehab (<3 hours/day)     Assistance Recommended at Discharge Frequent or constant Supervision/Assistance  Equipment Recommendations  None recommended by PT    Recommendations for Other Services       Precautions / Restrictions Precautions Precautions: Fall Restrictions Weight Bearing Restrictions: No     Mobility  Bed Mobility Overal bed mobility: Needs Assistance Bed Mobility: Supine to Sit;Sit to Supine     Supine to sit: Min assist;HOB elevated     General bed mobility comments:  (Slow movements with increased SOB noted)    Transfers                   General transfer comment: pt declined    Ambulation/Gait             General Gait Details:  (declined)   Stairs              Wheelchair Mobility    Modified Rankin (Stroke Patients Only)       Balance Overall balance assessment: Needs assistance Sitting-balance support: Feet unsupported;No upper extremity supported;Bilateral upper extremity supported Sitting balance-Leahy Scale: Fair Sitting balance - Comments:  (R lateral lean after 2 minutes due to fatigue) Postural control: Right lateral lean                                  Cognition Arousal/Alertness: Awake/alert Behavior During Therapy: Flat affect Overall Cognitive Status: No family/caregiver present to determine baseline cognitive functioning                                 General Comments:  (more awake than previous days)        Exercises General Exercises - Lower Extremity Ankle Circles/Pumps: AROM;Both;10 reps Heel Slides: AROM;Both;5 reps    General Comments General comments (skin integrity, edema, etc.): Pt very frail and deconditioned.      Pertinent Vitals/Pain Pain Assessment: 0-10 Pain Score: 4  Breathing: occasional labored breathing, short period of hyperventilation Pain Location: abdomen Pain Descriptors / Indicators: Aching;Discomfort Pain Intervention(s): Monitored during session;Limited activity within patient's tolerance;Repositioned    Home Living  Prior Function            PT Goals (current goals can now be found in the care plan section) Acute Rehab PT Goals Patient Stated Goal: to get out of the hospital    Frequency    Min 2X/week      PT Plan Current plan remains appropriate    Co-evaluation              AM-PAC PT "6 Clicks" Mobility   Outcome Measure  Help needed turning from your back to your side while in a flat bed without using bedrails?: A Little Help needed moving from lying on your back to sitting on the side of a flat bed without using bedrails?: A Lot Help needed moving to and from a bed to a chair  (including a wheelchair)?: A Lot Help needed standing up from a chair using your arms (e.g., wheelchair or bedside chair)?: A Lot Help needed to walk in hospital room?: A Lot Help needed climbing 3-5 steps with a railing? : A Lot 6 Click Score: 13    End of Session Equipment Utilized During Treatment: Oxygen Activity Tolerance: Patient limited by fatigue;Patient limited by pain Patient left: in bed;with call bell/phone within reach;with bed alarm set Nurse Communication: Mobility status PT Visit Diagnosis: Unsteadiness on feet (R26.81);Muscle weakness (generalized) (M62.81);History of falling (Z91.81);Difficulty in walking, not elsewhere classified (R26.2)     Time: 4656-8127 PT Time Calculation (min) (ACUTE ONLY): 14 min  Charges:  $Therapeutic Activity: 8-22 mins                    Felicia Sellers, PTA    Felicia Sellers 08/18/2021, 2:53 PM

## 2021-08-18 NOTE — Discharge Summary (Addendum)
[facility] Discharge Summary   Patient: Felicia Sellers FFM:[id number]  PCP: [facility]  Date of admission: 08/12/2021   Date of discharge:  08/20/2021     Discharge Diagnoses:  Principal Problem:   Acute on chronic respiratory failure with hypoxia ([place]) Active Problems:   Hypertension   Tobacco use   Personal history of lung cancer   Acute on chronic diastolic CHF (congestive heart failure) (H[facility])   Alcohol abuse   COPD exacerbation ([facility])   Sepsis ([place])   Atrial fibrillation with RVR (HCC)   Hyponatremia   Elevated troponin   HLD (hyperlipidemia)   CAD (coronary artery disease)   Hypokalemia   Pleural effusion   Admitted From: Home Disposition:  Hospice care  Recommendations for Outpatient Follow-up:  Follow-up with hospice care Follow up LABS/TEST:     Diet recommendation: Regular diet  Activity: The patient is advised to gradually reintroduce usual activities, as tolerated  Discharge Condition: stable  Code Status: DNR   History of present illness: As per the H and P dictated on admission Hospital Course:  80 year old F with PMH of COPD/chronic RF on 4 L, A. fib not on AC, stomach ulcer, CAD, diastolic CHF, lung cancer s/p right lobectomy, tobacco use disorder, alcohol abuse, ovarian mass, chronic hyponatremia, severe malnutrition, HTN, HLD and recurrent hospitalization with respiratory failure returning with shortness of breath and admitted with acute on chronic RF with hypoxia due to COPD and CHF exacerbation.  Initially required BiPAP.  Started on IV Zosyn, IV Lasix, steroid and breathing treatments Assessment & Plan: # Acute on chronic respiratory failure with hypoxia due to combination of COPD, CHF, A. fib, pleural effusion and underlying lung cancer.  Third hospitalization for the same issue in less than 5 weeks.  She was discharged on 10/16 on p.o. Augmentin, Bactrim and 4 L by [place].  Currently saturating at 99% on 4L. Worsening respirations  this am. Patient appears to be deteriorating. CT that resulted this afternoon shows reaccumulation of left sided pleural effusion. Previous cultures/cytology neg. continue supplemental O2 inhalation.  Patient was given cefepime during hospital stay, transition to oral antibiotics Augmentin TID for 5 additional days.  Started prednisone tapering dose, continue Mucinex twice daily and Robitussin-DM as needed.  repeat diagnostic/therapeutic thoracentesis was ordered but as per IR, suggested that patient have To mucous plugging in the left lower lobe which is subsequently collapsed, thoracentesis will not help so it was not done.  Continue incentive spirometry and flutter valve. # Urinary retention, Symptomatic. Retaining 1100 this morning, s/p I/o cath. Urinary retention persisted throughout the day, most recent pvr 700 ml.  Patient did pull out Foley catheter today morning, RN was advised to follow voiding trial.  Patient failed voiding trial, Foley catheter was reinserted early morning today.  Continue Foley catheter and keep Foley for 1 week and then follow voiding trial at hospice facility. # Acute on chronic diastolic CHF (congestive heart failure): TTE in 06/2021 with LVEF of 55 to 60% (from 70 to 75% in 2021) and moderate TVR. 07/09/2021 showed EF of 55 to 60%.  She had edema, JVD, rhonchi and rales on exam.  CXR with pulmonary edema and increased pleural effusion.  BNP elevated to 770.  S/p IV Lasix, Discontinued amlodipine and lisinopril to allow room for diuresis.  Blood pressure remained soft and under control with Lopressor and Cardizem.  Resume amlodipine and lisinopril if blood pressure remains high. # COPD exacerbation: Sputum culture grew Acinetobacter and moderate diphtheroid prior hospitalization.  She was discharged on Augmentin and Bactrim.  Remains rhonchorous on exam.  Has leukocytosis but Pro-Cal negative.  Ongoing tobacco use.  During hospital stay patient was given Zosyn IV which was changed  to cefepime and patient was given azithromycin as well.  On discharge discontinued Bactrim, continue Augmentin for 5 additional days.  Started tapering dose prednisone.  Continue PPI for GI prophylaxis.  Resumed inhalers, Continue mucolytic's/antitussive/IS sputum cultures NGTD.  Continue supplemental O2 inhalation. # Persistent A. fib with RVR: RVR resolved.  Not on anticoagulation likely due to history of stomach ulcer.  On Toprol-XL at home. Metoprolol 25 mg twice daily, increase to 50, s/p Cardizem IV infusion. # Acute metabolic encephalopathy: Resolved.  Continue reorientation and delirium precautions. # Dysphagia: Likely in the setting of encephalopathy. Continue dysphagia 3 diet, SLP eval done #Sepsis ruled out.  # Essential hypertension: Normotensive. S/p IV Lasix, increase metoprolol from 25-50 due to A. fib with RVR, discontinued amlodipine and lisinopril.  Continue to monitor and titrate medications accordingly. # Tobacco use disorder: Reportedly smokes about a pack a day. -Cessation counseling when able to comprehend.  S/p nicotine patch # Alcohol abuse-Per recent discharge summary, sober over the last 1 month. Has had intermittent ativan here but no clear signs of withdrawal. S/p CIWA, multivitamin, folic acid and thiamine # Personal history of lung cancer, -s/p of right lobectomy, 1/3 of right lung # Chronic hyponatremia: Improved.  Could be due to CHF and Bactrim. S/p IV Lasix and Fluid and sodium restriction # Elevated troponin and history of CAD: Troponin is minimally elevated 19 > 23 > 18.  Likely demand ischemia. Cardiac meds as above, Continue home statin. # Hypokalemia: Due to IV Lasix given for diuresis, potassium repleted Severe malnutrition: As evidenced by low BMI and significant muscle mass and subcu fat loss. Body mass index is 18.75 kg/m. Nutrition Problem: Severe Malnutrition Etiology: chronic illness (COPD, CHF, lung cancer, etoh abuse)-Consult  dietitian Signs/Symptoms: severe fat depletion, severe muscle depletion Physical deconditioning, PT/OT recommended SNF. Goal of care counseling-appropriately DNR/DNI.  A lot of comorbidity.  Poor long-term prognosis, concerned patient is nearing death. Palliative medicine consulted on admission Discussion was done with patient's family who made the decision for hospice care, patient has no capacity to make the decision.   On the day of the discharge the patient's vitals were stable, and no other acute medical condition were reported by patient. the patient was felt safe to be discharge at hospice facility  Consultants: Palliative care and hospice Procedures: None  Discharge Exam: General: Appear in mild distress, no Rash; Oral Mucosa Clear, moist. Cardiovascular: S1 and S2 Present, no Murmur, Respiratory: normal respiratory effort, Bilateral Air entry present and b/l Crackles, mild wheezes Abdomen: Bowel Sound present, Soft and no tenderness, no hernia Extremities: no Pedal edema, no calf tenderness Neurology: alert and oriented to time, place, and person affect appropriate.  Filed Weights   08/18/21 0617 08/19/21 0500 08/20/21 0500  Weight: 50.1 kg 50 kg 49.1 kg   Vitals:   08/20/21 0733 08/20/21 0804  BP: 131/90   Pulse: 96   Resp: 19   Temp: 98 F (36.7 C)   SpO2: 97% 95%    DISCHARGE MEDICATION: Allergies as of 08/20/2021       Reactions   Aspirin Other (See Comments)   High risk of bleeding        Medication List     STOP taking these medications    amLODipine 10 MG tablet Commonly known as:  NORVASC   lisinopril 10 MG tablet Commonly known as: ZESTRIL   sulfamethoxazole-trimethoprim 800-160 MG tablet Commonly known as: BACTRIM DS       TAKE these medications    Albuterol Sulfate 108 (90 Base) MCG/ACT Aepb Commonly known as: PROAIR RESPICLICK Inhale 2 puffs into the lungs every 6 (six) hours as needed.   amoxicillin-clavulanate 500-125 MG  tablet Commonly known as: AUGMENTIN Take 1 tablet (500 mg total) by mouth every 8 (eight) hours for 5 days.   atorvastatin 40 MG tablet Commonly known as: LIPITOR Take 40 mg by mouth at bedtime.   calcium carbonate 1250 (500 Ca) MG tablet Commonly known as: OS-CAL - dosed in mg of elemental calcium Take 1 tablet by mouth 2 (two) times daily with a meal.   Cholecalciferol 25 MCG (1000 UT) capsule Take 1,000 Units by mouth daily.   dextromethorphan-guaiFENesin 30-600 MG 12hr tablet Commonly known as: MUCINEX DM Take 1 tablet by mouth 2 (two) times daily as needed for cough.   diclofenac Sodium 1 % Gel Commonly known as: VOLTAREN Apply topically 2 (two) times daily. Apply to upper back and shoulders every morning and at bedtime related to muscle weakness.   docusate sodium 100 MG capsule Commonly known as: COLACE Take 100 mg by mouth daily.   folic acid 1 MG tablet Commonly known as: FOLVITE Take 1 tablet (1 mg total) by mouth daily.   gabapentin 300 MG capsule Commonly known as: NEURONTIN Take 300 mg by mouth 3 (three) times daily.   guaiFENesin 600 MG 12 hr tablet Commonly known as: MUCINEX Take 1 tablet (600 mg total) by mouth 2 (two) times daily for 7 days.   ipratropium 0.02 % nebulizer solution Commonly known as: ATROVENT Take 0.5 mg by nebulization every 12 (twelve) hours as needed for wheezing or shortness of breath.   loperamide 2 MG capsule Commonly known as: IMODIUM Take 4 mg by mouth as needed for diarrhea or loose stools.   metoprolol succinate 50 MG 24 hr tablet Commonly known as: TOPROL-XL Take 1 tablet (50 mg total) by mouth daily. What changed:  medication strength how much to take   predniSONE 20 MG tablet Commonly known as: DELTASONE Take 2 tablets (40 mg total) by mouth daily for 5 days. 30 mg po daily x 3 days, 20 mg po daily x 3 days, 10 mg po daily x 3 days What changed: additional instructions   sodium chloride 1 g tablet Take 2 g by  mouth 3 (three) times daily.       Allergies  Allergen Reactions   Aspirin Other (See Comments)    High risk of bleeding   Discharge Instructions     Diet - low sodium heart healthy   Complete by: As directed    Discharge instructions   Complete by: As directed    Follow-up with hospice care   Increase activity slowly   Complete by: As directed        The results of significant diagnostics from this hospitalization (including imaging, microbiology, ancillary and laboratory) are listed below for reference.    Significant Diagnostic Studies: CT CHEST WO CONTRAST  Result Date: 08/15/2021 CLINICAL DATA:  Respiratory failure. EXAM: CT CHEST WITHOUT CONTRAST TECHNIQUE: Multidetector CT imaging of the chest was performed following the standard protocol without IV contrast. COMPARISON:  August 07, 2021. FINDINGS: Cardiovascular: Mild cardiomegaly is noted without pericardial effusion. Coronary artery calcifications are again noted. Atherosclerosis of thoracic aorta is noted. 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 Mediastinum/Nodes: No enlarged mediastinal or axillary lymph nodes. Thyroid gland, trachea, and esophagus demonstrate no significant findings. Lungs/Pleura: No pneumothorax is noted. Moderate size left pleural effusion is noted with associated atelectasis of left lower lobe. Minimal right basilar subsegmental atelectasis is noted. Upper Abdomen: No acute abnormality. Musculoskeletal: No chest wall mass or suspicious bone lesions identified. IMPRESSION: Moderate size left pleural effusion is noted with associated atelectasis of the left lower lobe. 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 Coronary artery calcifications are noted suggesting coronary artery disease. Minimal right posterior basilar subsegmental atelectasis is noted. Aortic Atherosclerosis (ICD10-I70.0) and Emphysema (ICD10-J43.9). Electronically  Signed   By: Marijo Conception M.D.   On: 08/15/2021 15:00   CT CHEST W CONTRAST  Result Date: 08/07/2021 CLINICAL DATA:  COPD exacerbation EXAM: CT CHEST WITH CONTRAST TECHNIQUE: Multidetector CT imaging of the chest was performed during intravenous contrast administration. CONTRAST:  44mL OMNIPAQUE IOHEXOL 300 MG/ML  SOLN COMPARISON:  07/09/2021 FINDINGS: Cardiovascular: Cardiomegaly. No pericardial effusion. Advanced atherosclerosis of the aorta with irregular plaque diffusely and the descending aneurysm which measures 4.9 Cm in diameter. No pulmonary artery filling defect. Mediastinum/Nodes: No adenopathy mass. Mild food retention of the esophagus. Lungs/Pleura: Moderate to large left pleural effusion which is simple density with extensive atelectasis. Significant clearing right-sided pneumonia since prior CT. Postoperative right upper lobe. Improved nodularity on prior which is also attributed to infection. Emphysema. Upper Abdomen: Extensive atherosclerosis. Hepatic venous reflux attributed to right heart failure Musculoskeletal: Exaggerated kyphosis.  No acute finding IMPRESSION: 1. Moderate to large simple left pleural effusion with multi segment atelectasis. 2. Essentially resolved pneumonia seen on prior CT. 3. Advanced atherosclerosis especially of the aorta with descending aneurysm measuring up to 4.9 cm in diameter. Follow-up depends on true morbidity/life expectancy. Aortic Atherosclerosis (ICD10-I70.0) and Emphysema (ICD10-J43.9). Electronically Signed   By: Jorje Guild M.D.   On: 08/07/2021 10:22   NM PET Image Initial (PI) Skull Base To Thigh  Result Date: 07/26/2021 CLINICAL DATA:  Initial treatment strategy for right lung mass. EXAM: NUCLEAR MEDICINE PET SKULL BASE TO THIGH TECHNIQUE: 6.1 mCi F-18 FDG was injected intravenously. Full-ring PET imaging was performed from the skull base to thigh after the radiotracer. CT data was obtained and used for attenuation correction and anatomic  localization. Fasting blood glucose: 78 mg/dl COMPARISON:  CT chest 07/09/2021 FINDINGS: Mediastinal blood pool activity: SUV max 1.9 Liver activity: SUV max NA NECK: No significant abnormal hypermetabolic activity in this region. Incidental CT findings: Bilateral common carotid atherosclerotic calcifications. CHEST: Substantial clearing of prior airspace opacity with mild residual peripheral nodularity in the remaining right lung, particularly peripherally in the right lower lobe, which warrants surveillance but is currently not of high suspicion for active malignancy. This clustered peripheral nodularity has a maximum SUV up to 1.9. No central obstructing hypermetabolic lesion is identified. Right lower paratracheal lymph node 1.2 cm in short axis with maximum SUV 2.4, previous maximum SUV 3.5. Incidental CT findings: Centrilobular emphysema. Coronary, aortic arch, and branch vessel atherosclerotic vascular disease. 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 Aneurysm of the descending thoracic aorta 5.1 cm in diameter. Moderate cardiomegaly. Dense mitral valve calcification. Trace pericardial effusion posteriorly. Small bilateral pleural effusions with associated passive atelectasis. Postoperative findings in the right lung. There is also some mild peripheral scattered nodularity in the left lung which is moderately improved compared to 07/09/2021. ABDOMEN/PELVIS: Complex cystic lesion in the left  adnexa measuring 11.3 by 6.3 cm on image 191 series 4 with several loculations of varying density, but photopenic on the PET data. Smaller photopenic lesion of the right ovary. Incidental CT findings: Gallbladder wall thickening, nonspecific although inflammation is not readily excluded. Extensive atherosclerosis. Sigmoid colon diverticulosis. Diffuse subcutaneous edema. SKELETON: No significant abnormal hypermetabolic activity in this region. Incidental CT findings: Degenerative hip arthropathy, right greater than  left. IMPRESSION: 1. Interval clearance of the vast majority of the airspace opacity in the right lung. There some small peripheral areas of scattered nodularity which merit chest CT surveillance but which are not substantially hypermetabolic today, and accordingly may potentially be inflammatory/infectious. Postoperative findings in the right lung. 2. Stable mild paratracheal adenopathy, although SUV has decreased compared to the prior chest CT of 08/22/2018. 3. Complex cystic lesion of the left adnexa measuring up to 11.3 cm in long axis, photopenic. Smaller lesion of the right ovary is not substantially hypermetabolic either. 4. 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 Recommend semi-annual imaging followup by CTA or MRA and referral to cardiothoracic surgery if not already obtained. This recommendation follows 2010 ACCF/AHA/AATS/ACR/ASA/SCA/SCAI/SIR/STS/SVM Guidelines for the Diagnosis and Management of Patients With Thoracic Aortic Disease. Circulation. 2010; 121: D664-Q03. Aortic aneurysm NOS (ICD10-I71.9) 5. Other imaging findings of potential clinical significance: Aortic Atherosclerosis (ICD10-I70.0) and Emphysema (ICD10-J43.9). Coronary atherosclerosis and extensive abdominal atherosclerosis. Moderate cardiomegaly. Dense mitral valve calcifications. Small bilateral pleural effusions along with subcutaneous edema suggesting mild third spacing of fluid. Nonspecific wall thickening in the gallbladder. Sigmoid colon diverticulosis. Degenerative hip arthropathy. Electronically Signed   By: Van Clines M.D.   On: 07/26/2021 07:13   DG Chest Portable 1 View  Result Date: 08/12/2021 CLINICAL DATA:  Acute onset short of breath EXAM: PORTABLE CHEST 1 VIEW COMPARISON:  08/08/2021 FINDINGS: Stable cardiac silhouette. Interval increase in bilateral pleural effusions. Mild interstitial edema pattern also increased. No pneumothorax. No focal consolidation. IMPRESSION:  Increasing pleural effusions and interstitial edema. Electronically Signed   By: Suzy Bouchard M.D.   On: 08/12/2021 07:26   DG Chest Port 1 View  Result Date: 08/08/2021 CLINICAL DATA:  Status post thoracentesis on the left side. EXAM: PORTABLE CHEST 1 VIEW COMPARISON:  08/07/2021 FINDINGS: Improved aeration in the left lower chest. Residual densities at the left lung base. Prominent interstitial lung markings bilaterally and minimally changed. Blunting of the right costophrenic angle is suggestive for right pleural fluid. Negative for pneumothorax. Heart size remains enlarged. IMPRESSION: 1. Negative for a pneumothorax following the left thoracentesis. 2. Improved aeration in the left lower lung with residual densities at the left lung base. 3. Evidence for right pleural effusion. Electronically Signed   By: Markus Daft M.D.   On: 08/08/2021 10:38   DG Chest Portable 1 View  Result Date: 08/07/2021 CLINICAL DATA:  Increasing shortness of breath EXAM: PORTABLE CHEST 1 VIEW COMPARISON:  07/08/2021 FINDINGS: Improved right lung aeration. Worse left base aeration at least partially from pleural fluid. Small right pleural effusion. Generalized reticulonodular opacity. Chronic cardiomegaly. IMPRESSION: 1. Left more than right pleural effusion with obscured or opacified left lower lobe. 2. Significant clearance of right-sided pneumonia since chest x-ray last month. 3. Background chronic lung disease and nodularity reference recent PET. Electronically Signed   By: Jorje Guild M.D.   On: 08/07/2021 05:04   US THORACENTESIS ASP PLEURAL SPACE W/IMG GUIDE  Result Date: 08/08/2021 INDICATION: Patient with shortness of breath and large left pleural effusion request received  for thoracentesis. EXAM: ULTRASOUND GUIDED DIAGNOSTIC AND THERAPEUTIC THORACENTESIS MEDICATIONS: Local 1% lidocaine only. COMPLICATIONS: None immediate. PROCEDURE: An ultrasound guided thoracentesis was thoroughly discussed with the  patient and questions answered. The benefits, risks, alternatives and complications were also discussed. The patient understands and wishes to proceed with the procedure. Written consent was obtained. Ultrasound was performed to localize and mark an adequate pocket of fluid in the left chest. The area was then prepped and draped in the normal sterile fashion. 1% Lidocaine was used for local anesthesia. Under ultrasound guidance a 6 Fr Safe-T-Centesis catheter was introduced. Thoracentesis was performed. The catheter was removed and a dressing applied. FINDINGS: A total of approximately 700 mL of clear yellow fluid was removed. Samples were sent to the laboratory as requested by the clinical team. IMPRESSION: Successful ultrasound guided left thoracentesis yielding 700 mL of pleural fluid. Read By: Tsosie Billing PA-C Electronically Signed   By: Ruthann Cancer M.D.   On: 08/08/2021 10:47    Microbiology: Recent Results (from the past 240 hour(s))  Resp Panel by RT-PCR (Flu A&B, Covid) Nasopharyngeal Swab     Status: None   Collection Time: 08/12/21  6:25 AM   Specimen: Nasopharyngeal Swab; Nasopharyngeal(NP) swabs in vial transport medium  Result Value Ref Range Status   SARS Coronavirus 2 by RT PCR NEGATIVE NEGATIVE Final    Comment: (NOTE) SARS-CoV-2 target nucleic acids are NOT DETECTED.  The SARS-CoV-2 RNA is generally detectable in upper respiratory specimens during the acute phase of infection. The lowest concentration of SARS-CoV-2 viral copies this assay can detect is 138 copies/mL. A negative result does not preclude SARS-Cov-2 infection and should not be used as the sole basis for treatment or other patient management decisions. A negative result may occur with  improper specimen collection/handling, submission of specimen other than nasopharyngeal swab, presence of viral mutation(s) within the areas targeted by this assay, and inadequate number of viral copies(<138 copies/mL). A negative  result must be combined with clinical observations, patient history, and epidemiological information. The expected result is Negative.  Fact Sheet for Patients:  EntrepreneurPulse.com.au  Fact Sheet for Healthcare Providers:  IncredibleEmployment.be  This test is no t yet approved or cleared by the [place] FDA and  has been authorized for detection and/or diagnosis of SARS-CoV-2 by FDA under an Emergency Use Authorization (EUA). This EUA will remain  in effect (meaning this test can be used) for the duration of the COVID-19 declaration under Section 564(b)(1) of the Act, 21 U.S.C.section 360bbb-3(b)(1), unless the authorization is terminated  or revoked sooner.       Influenza A by PCR NEGATIVE NEGATIVE Final   Influenza B by PCR NEGATIVE NEGATIVE Final    Comment: (NOTE) The Xpert Xpress SARS-CoV-2/FLU/RSV plus assay is intended as an aid in the diagnosis of influenza from Nasopharyngeal swab specimens and should not be used as a sole basis for treatment. Nasal washings and aspirates are unacceptable for Xpert Xpress SARS-CoV-2/FLU/RSV testing.  Fact Sheet for Patients: EntrepreneurPulse.com.au  Fact Sheet for Healthcare Providers: IncredibleEmployment.be  This test is not yet approved or cleared by the [place] FDA and has been authorized for detection and/or diagnosis of SARS-CoV-2 by FDA under an Emergency Use Authorization (EUA). This EUA will remain in effect (meaning this test can be used) for the duration of the COVID-19 declaration under Section 564(b)(1) of the Act, 21 U.S.C. section 360bbb-3(b)(1), unless the authorization is terminated or revoked.  Performed at [facility], [address] [place],  [place] [address]   Culture, blood (Routine X 2) w Reflex to ID Panel     Status: None   Collection Time: 08/13/21  7:31 PM   Specimen: BLOOD  Result Value Ref Range  Status   Specimen Description BLOOD [facility]  Final   Special Requests   Final    BOTTLES DRAWN AEROBIC AND ANAEROBIC Blood Culture adequate volume   Culture   Final    NO GROWTH 5 DAYS Performed at [facility], [address], [place], [place] [address]    Report Status 08/18/2021 FINAL  Final  Culture, blood (Routine X 2) w Reflex to ID Panel     Status: None   Collection Time: 08/13/21  7:50 PM   Specimen: BLOOD  Result Value Ref Range Status   Specimen Description BLOOD [facility]  Final   Special Requests   Final    BOTTLES DRAWN AEROBIC AND ANAEROBIC Blood Culture adequate volume   Culture   Final    NO GROWTH 5 DAYS Performed at [facility], [address] [place], [place] [address]    Report Status 08/18/2021 FINAL  Final  Urine Culture     Status: None   Collection Time: 08/15/21 10:35 AM   Specimen: In/Out Cath Urine  Result Value Ref Range Status   Specimen Description   Final    IN/OUT CATH URINE Performed at [facility], [address], [place], [place] [address]    Special Requests   Final    NONE Performed at [facility], [address] [place], [place] [address]    Culture   Final    NO GROWTH Performed at [facility], [place] [address], [place], [place] [address]    Report Status 08/16/2021 FINAL  Final     Labs: CBC: Recent Labs  Lab 08/14/21 0624 08/15/21 0444 08/16/21 0529 08/17/21 0350 08/18/21 0506 08/19/21 0507 08/20/21 0543  WBC 9.6   < > 13.9* 17.1* 22.1* 20.8* 21.7*  NEUTROABS 7.1  --   --   --   --   --   --   HGB 14.4   < > 14.5 13.6 13.9 15.0 13.5  HCT 42.1   < > 43.5 39.1 38.7 41.2 38.7  MCV 92.7   < > 94.4 93.8 91.7 91.6 91.1  PLT 281   < > 226 225 208 215 206   < > = values in this interval not displayed.   Basic Metabolic Panel: Recent Labs  Lab 08/14/21 0624 08/15/21 0444 08/16/21 0529 08/17/21 0350 08/18/21 0506 08/19/21 0507 08/20/21 0543  NA 132* 134* 135 132* 126* 125* 128*   K 3.7 3.5 3.4* 2.9* 3.3* 4.1 3.6  CL 86* 83* 85* 82* 80* 82* 89*  CO2 35* 33* 37* 37* 37* 34* 34*  GLUCOSE 96 124* 120* 156* 112* 105* 99  BUN 19 29* 29* 35* 32* 31* 29*  CREATININE 0.60 0.78 0.61 0.63 0.61 0.65 0.56  CALCIUM 8.4* 8.8* 9.1 9.1 8.9 9.0 8.7*  MG 1.8 2.2  --  1.7 1.6* 2.2  --   PHOS 4.2 4.0  --  2.6 2.7 3.2  --    Liver Function Tests: Recent Labs  Lab 08/14/21 0624 08/15/21 0444 08/16/21 0529  AST  --   --  14*  ALT  --   --  21  ALKPHOS  --   --  85  BILITOT  --   --  1.4*  PROT  --   --  6.0*  ALBUMIN 3.4* 3.8 3.4*   No results for input(s): LIPASE, AMYLASE in the last 168 hours. Recent Labs  Lab 08/14/21 0624  AMMONIA 17   Cardiac Enzymes: No results for input(s): CKTOTAL, CKMB, CKMBINDEX, TROPONINI in the last 168 hours. BNP (last 3 results) Recent Labs    08/07/21 0435 08/12/21 0840 08/14/21 0624  BNP 581.7* 768.0* 734.6*   CBG: No results for input(s): GLUCAP in the last 168 hours.  Time spent: 35 minutes  Signed:  Val Riles  [facility]  08/20/2021 10:07 AM

## 2021-08-19 LAB — CBC
HCT: 41.2 % (ref 36.0–46.0)
Hemoglobin: 15 g/dL (ref 12.0–15.0)
MCH: 33.3 pg (ref 26.0–34.0)
MCHC: 36.4 g/dL — ABNORMAL HIGH (ref 30.0–36.0)
MCV: 91.6 fL (ref 80.0–100.0)
Platelets: 215 10*3/uL (ref 150–400)
RBC: 4.5 MIL/uL (ref 3.87–5.11)
RDW: 14.5 % (ref 11.5–15.5)
WBC: 20.8 10*3/uL — ABNORMAL HIGH (ref 4.0–10.5)
nRBC: 0 % (ref 0.0–0.2)

## 2021-08-19 LAB — BASIC METABOLIC PANEL
Anion gap: 9 (ref 5–15)
BUN: 31 mg/dL — ABNORMAL HIGH (ref 8–23)
CO2: 34 mmol/L — ABNORMAL HIGH (ref 22–32)
Calcium: 9 mg/dL (ref 8.9–10.3)
Chloride: 82 mmol/L — ABNORMAL LOW (ref 98–111)
Creatinine, Ser: 0.65 mg/dL (ref 0.44–1.00)
GFR, Estimated: >60 mL/min (ref >60)
Glucose, Bld: 105 mg/dL — ABNORMAL HIGH (ref 70–99)
Potassium: 4.1 mmol/L (ref 3.5–5.1)
Sodium: 125 mmol/L — ABNORMAL LOW (ref 135–145)

## 2021-08-19 LAB — PHOSPHORUS: Phosphorus: 3.2 mg/dL (ref 2.5–4.6)

## 2021-08-19 LAB — MAGNESIUM: Magnesium: 2.2 mg/dL (ref 1.7–2.4)

## 2021-08-19 NOTE — Progress Notes (Addendum)
[facility]) Hospital Liaison Note  Unfortunately, [facility]osp[facility] is not able to offer a bed today. At this time [facility] is able to offer a bed for tomorrow, 10.29.22 with request for transport to [place] at 12 p on 10.29.22. Family and Meagan Hagwood, LCSW [facility] manager aware.  Please send signed and completed DNR with patient at time of discharge.   Nurse please call report to the [facility] at [phone] prior to patient leaving the unit.   Please do not hesitate to call with any hospice related questions.    Thank you for the opportunity to participate in this patient's care.   Bobbie "Loren Racer, RN, BSN [facility] Liaison [phone]

## 2021-08-19 NOTE — Progress Notes (Signed)
Having PROGRESS NOTE  Felicia Sellers KCL:[id number] DOB: Dec 30, 1940   PCP: [facility]  Patient is from: Home.  Reportedly independent for most ADLs at baseline.  DOA: 08/12/2021 LOS: 7  Chief complaints:  Chief Complaint  Patient presents with   Respiratory Distress    Pt in via EMS from pt's home for SOB. EMS report pt's SPO2 at home on 4l 91%. EMS report pt's family states they can't take care of her anymore. EMS place pt on 6l n/c 96%.     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 fib not on AC, stomach ulcer, CAD, diastolic CHF, lung cancer s/p right lobectomy, tobacco use disorder, alcohol abuse, ovarian mass, chronic hyponatremia, severe malnutrition, HTN, HLD and recurrent hospitalization with respiratory failure returning with shortness of breath and admitted with acute on chronic RF with hypoxia due to COPD and CHF exacerbation.  Initially required BiPAP.  Started on IV Zosyn, IV Lasix, steroid and breathing treatments.  Subjective: No significant overnight events, patient shortness of breath, productive cough but feels little bit improvement.  Patient has significant sensorineural deafness, patient was advised that we will treat her today and keep her in the hospital.  Plan for disposition tomorrow a.m. to the hospice.   Objective: Vitals:   08/19/21 0500 08/19/21 0802 08/19/21 0815 08/19/21 1527  BP: 138/88 (!) 142/86  (!) 144/82  Pulse: 95 90  92  Resp: 20 20  20   Temp: 98 F (36.7 C) 98 F (36.7 C)  98 F (36.7 C)  TempSrc: Oral Oral  Oral  SpO2: 95% 95% 94% 94%  Weight: 50 kg     Height:        Intake/Output Summary (Last 24 hours) at 08/19/2021 1538 Last data filed at 08/19/2021 0700 Gross per 24 hour  Intake 200 ml  Output 300 ml  Net -100 ml   Filed Weights   08/17/21 0500 08/18/21 0617 08/19/21 0500  Weight: 48 kg 50.1 kg 50 kg    Examination:  GENERAL: Frail looking elderly female. In  mild respiratory distress HEENT: MMM.  Hard of hearing bilateral SNHL NECK: Supple.  No apparent JVD.  RESP: rhonchi and rales throughout CVS:  RRR. Heart sounds normal.  ABD/GI/GU: BS+. Abd soft, mild tenderness suprapubically MSK/EXT:  Moves extremities. No apparent deformity. No edema.  SKIN: no apparent skin lesion or wound NEURO: Awake and alert.  PSYCH: Calm. Normal affect.     Procedures:  None  Microbiology summarized: [id number] and influenza PCR nonreactive. Sputum culture pending. Blood culture pending.  Assessment & Plan: Acute on chronic respiratory failure with hypoxia due to combination of COPD, CHF, A. fib, pleural effusion and underlying lung cancer.  Third hospitalization for the same issue in less than 5 weeks.  She was discharged on 10/16 on p.o. Augmentin, Bactrim and 4 L by [place].  Currently saturating at 99% on 4L. Worsening respirations this am. Patient appears to be deteriorating. CT that resulted this afternoon shows reaccumulation of left sided pleural effusion. Previous cultures/cytology neg - cont o2, abx, steroids - abg ordered, may need bipap - palliative consulted - repeat diagnostic/therapeutic thoracentesis was ordered but as per IR, suggested that patient have To mucous plugging in the left lower lobe which is subsequently collapsed, thoracentesis will not help so it was not done.   Urinary retention Symptomatic. Retaining 1100 this morning, s/p I/o cath. Urinary retention persisted throughout the day, most recent pvr 700  ml - s/p Foley catheter, patient pulled out Foley catheter and she was unable to void so Foley catheter was reinserted.   Acute on chronic diastolic CHF (congestive heart failure): TTE in 06/2021 with LVEF of 55 to 60% (from 70 to 75% in 2021) and moderate TVR. 07/09/2021 showed EF of 55 to 60%.  She had edema, JVD, rhonchi and rales on exam.  CXR with pulmonary edema and increased pleural effusion.  BNP elevated to 770.  Started on IV  Lasix.  She had 2.1 L UOP/24 hours.   -Continue IV Lasix 40 mg twice daily -Discontinued amlodipine and lisinopril to allow room for diuresis. -Monitor fluid status, renal functions and electrolytes. -Sodium and fluid restrictions    COPD exacerbation: Sputum culture grew Acinetobacter and moderate diphtheroid prior hospitalization.  She was discharged on Augmentin and Bactrim.  Remains rhonchorous on exam.  Has leukocytosis but Pro-Cal negative.  Ongoing tobacco use.  -Discontinued Augmentin and Bactrim -Changed since Zosyn to IV cefepime and azithromycin -Continue IV Solu-Medrol and bronchodilators -Continue mucolytic's/antitussive/IS -OOB/PT/OT -Titrate oxygen for saturation above 90% - sputum cultures NGTD  Persistent A. fib with RVR: RVR resolved.  Not on anticoagulation likely due to history of stomach ulcer.  On Toprol-XL at home. -Metoprolol 25 mg twice daily, increase to 50  Acute metabolic encephalopathy: Resolved.   -Reorientation delirium precautions. -Minimize or avoid sedating medications  Dysphagia: Likely in the setting of encephalopathy. -Continue dysphagia 3 diet -SLP eval   Sepsis ruled out.    Essential hypertension: Normotensive. -IV Lasix and metoprolol as above -Discontinued amlodipine and lisinopril   Tobacco use disorder: Reportedly smokes about a pack a day. -Cessation counseling when able to comprehend. -Continue nicotine patch  Alcohol abuse-Per recent discharge summary, sober over the last 1 month. Has had intermittent ativan here but no clear signs of withdrawal. -Continue CIWA, multivitamin, folic acid and thiamine   Personal history of lung cancer -s/p of right lobectomy, 1/3 of right lung   Chronic hyponatremia: Improved.  Could be due to CHF and Bactrim. -Continue IV Lasix -Fluid and sodium restriction   Elevated troponin and history of CAD: Troponin is minimally elevated 19 > 23 > 18.  Likely demand ischemia -Cardiac meds as  above -Continue home statin.  Hypokalemia:  Potassium repleted, monitor and replete as needed.   Physical deconditioning -PT/OT recommended SNF.   Goal of care counseling-appropriately DNR/DNI.  A lot of comorbidity.  Poor long-term prognosis. I am concerned patient is nearing death. -Palliative medicine consulted on admission Discussion was done with patient's family who made the decision for hospice care, patient has no capacity to make the decision.  Awaiting for hospice bed availability   Severe malnutrition: As evidenced by low BMI and significant muscle mass and subcu fat loss. Body mass index is 19.53 kg/m. Nutrition Problem: Severe Malnutrition Etiology: chronic illness (COPD, CHF, lung cancer, etoh abuse)-Consult dietitian Signs/Symptoms: severe fat depletion, severe muscle depletion   Pressure Injury 08/18/21 Sacrum Medial Stage 2 -  Partial thickness loss of dermis presenting as a shallow open injury with a red, pink wound bed without slough. (Active)  08/18/21 2100  Location: Sacrum  Location Orientation: Medial  Staging: Stage 2 -  Partial thickness loss of dermis presenting as a shallow open injury with a red, pink wound bed without slough.  Wound Description (Comments):   Present on Admission: No   DVT prophylaxis:  heparin injection 5,000 Units Start: 08/13/21 1400  Code Status: DNR/DNI Family Communication: nephew who is  next of kin updated today on patient's tenuous status Level of care: Med-Surg Status is: Inpatient  Remains inpatient appropriate because: IV medication and need for palliative medicine safe disposition Patient will be discharged to hospice care when bed will be available   Consultants:  Palliative medicine   Sch Meds:  Scheduled Meds:  atorvastatin  40 mg Oral QHS   calcium carbonate  1 tablet Oral BID WC   Chlorhexidine Gluconate Cloth  6 each Topical Daily   cholecalciferol  1,000 Units Oral Daily   docusate sodium  100 mg  Oral Daily   feeding supplement (NEPRO CARB STEADY)  237 mL Oral TID BM   folic acid  1 mg Oral Daily   gabapentin  100 mg Oral TID   guaiFENesin  600 mg Oral BID   heparin  5,000 Units Subcutaneous Q8H   ipratropium-albuterol  3 mL Nebulization Q6H   methylPREDNISolone (SOLU-MEDROL) injection  40 mg Intravenous Q12H   metoprolol tartrate  50 mg Oral BID   multivitamin with minerals  1 tablet Oral Daily   nicotine  21 mg Transdermal Daily   thiamine  100 mg Oral Daily   Or   thiamine  100 mg Intravenous Daily   Continuous Infusions:  sodium chloride 10 mL/hr at 08/19/21 1031   ceFEPime (MAXIPIME) IV 2 g (08/19/21 1039)   PRN Meds:.sodium chloride, acetaminophen, albuterol, dextromethorphan-guaiFENesin, diclofenac Sodium, loperamide, morphine injection, ondansetron (ZOFRAN) IV  Antimicrobials: Anti-infectives (From admission, onward)    Start     Dose/Rate Route Frequency Ordered Stop   08/18/21 0000  amoxicillin-clavulanate (AUGMENTIN) 500-125 MG tablet        1 tablet Oral Every 8 hours 08/18/21 1157 08/23/21 2359   08/13/21 1400  amoxicillin-clavulanate (AUGMENTIN) 500-125 MG per tablet 500 mg  Status:  Discontinued        1 tablet Oral Every 8 hours 08/13/21 1110 08/13/21 1233   08/13/21 1400  azithromycin (ZITHROMAX) 500 mg in sodium chloride 0.9 % 250 mL IVPB        500 mg 250 mL/hr over 60 Minutes Intravenous Every 24 hours 08/13/21 1254 08/15/21 1350   08/13/21 1345  ceFEPIme (MAXIPIME) 2 g in sodium chloride 0.9 % 100 mL IVPB        2 g 200 mL/hr over 30 Minutes Intravenous Every 12 hours 08/13/21 1330 08/19/21 2359   08/13/21 1300  metroNIDAZOLE (FLAGYL) IVPB 500 mg  Status:  Discontinued        500 mg 100 mL/hr over 60 Minutes Intravenous Every 12 hours 08/13/21 1254 08/13/21 1254   08/13/21 1115  sulfamethoxazole-trimethoprim (BACTRIM DS) 800-160 MG per tablet 1 tablet  Status:  Discontinued        1 tablet Oral Every 12 hours 08/13/21 1110 08/13/21 1253   08/12/21  1430  piperacillin-tazobactam (ZOSYN) IVPB 3.375 g  Status:  Discontinued        3.375 g 12.5 mL/hr over 240 Minutes Intravenous Every 8 hours 08/12/21 1406 08/13/21 1254        I have personally reviewed the following labs and images: CBC: Recent Labs  Lab 08/14/21 0624 08/15/21 0444 08/16/21 0529 08/17/21 0350 08/18/21 0506 08/19/21 0507  WBC 9.6 14.0* 13.9* 17.1* 22.1* 20.8*  NEUTROABS 7.1  --   --   --   --   --   HGB 14.4 15.3* 14.5 13.6 13.9 15.0  HCT 42.1 43.0 43.5 39.1 38.7 41.2  MCV 92.7 93.3 94.4 93.8 91.7 91.6  PLT 281 266  226 225 208 215   BMP &GFR Recent Labs  Lab 08/14/21 0624 08/15/21 0444 08/16/21 0529 08/17/21 0350 08/18/21 0506 08/19/21 0507  NA 132* 134* 135 132* 126* 125*  K 3.7 3.5 3.4* 2.9* 3.3* 4.1  CL 86* 83* 85* 82* 80* 82*  CO2 35* 33* 37* 37* 37* 34*  GLUCOSE 96 124* 120* 156* 112* 105*  BUN 19 29* 29* 35* 32* 31*  CREATININE 0.60 0.78 0.61 0.63 0.61 0.65  CALCIUM 8.4* 8.8* 9.1 9.1 8.9 9.0  MG 1.8 2.2  --  1.7 1.6* 2.2  PHOS 4.2 4.0  --  2.6 2.7 3.2   Estimated Creatinine Clearance: 42.8 mL/min (by C-G formula based on SCr of 0.65 mg/dL). Liver & Pancreas: Recent Labs  Lab 08/14/21 0624 08/15/21 0444 08/16/21 0529  AST  --   --  14*  ALT  --   --  21  ALKPHOS  --   --  85  BILITOT  --   --  1.4*  PROT  --   --  6.0*  ALBUMIN 3.4* 3.8 3.4*   No results for input(s): LIPASE, AMYLASE in the last 168 hours. Recent Labs  Lab 08/14/21 0624  AMMONIA 17   Diabetic: No results for input(s): HGBA1C in the last 72 hours.  No results for input(s): GLUCAP in the last 168 hours.  Cardiac Enzymes: No results for input(s): CKTOTAL, CKMB, CKMBINDEX, TROPONINI in the last 168 hours. No results for input(s): PROBNP in the last 8760 hours. Coagulation Profile: No results for input(s): INR, PROTIME in the last 168 hours. Thyroid Function Tests: No results for input(s): TSH, T4TOTAL, FREET4, T3FREE, THYROIDAB in the last 72  hours.  Lipid Profile: No results for input(s): CHOL, HDL, LDLCALC, TRIG, CHOLHDL, LDLDIRECT in the last 72 hours.  Anemia Panel: No results for input(s): VITAMINB12, FOLATE, FERRITIN, TIBC, IRON, RETICCTPCT in the last 72 hours. Urine analysis:    Component Value Date/Time   COLORURINE STRAW (A) 08/15/2021 1055   APPEARANCEUR CLEAR (A) 08/15/2021 1055   APPEARANCEUR Clear 05/28/2014 0855   LABSPEC 1.008 08/15/2021 1055   LABSPEC 1.013 05/28/2014 0855   PHURINE 6.0 08/15/2021 1055   GLUCOSEU NEGATIVE 08/15/2021 1055   GLUCOSEU Negative 05/28/2014 0855   HGBUR NEGATIVE 08/15/2021 1055   BILIRUBINUR NEGATIVE 08/15/2021 1055   BILIRUBINUR Negative 05/28/2014 0855   KETONESUR NEGATIVE 08/15/2021 1055   PROTEINUR NEGATIVE 08/15/2021 1055   NITRITE NEGATIVE 08/15/2021 1055   LEUKOCYTESUR NEGATIVE 08/15/2021 1055   LEUKOCYTESUR Negative 05/28/2014 0855   Sepsis Labs: Invalid input(s): PROCALCITONIN, [place]  Microbiology: Recent Results (from the past 240 hour(s))  Resp Panel by RT-PCR (Flu A&B, Covid) Nasopharyngeal Swab     Status: None   Collection Time: 08/12/21  6:25 AM   Specimen: Nasopharyngeal Swab; Nasopharyngeal(NP) swabs in vial transport medium  Result Value Ref Range Status   SARS Coronavirus 2 by RT PCR NEGATIVE NEGATIVE Final    Comment: (NOTE) SARS-CoV-2 target nucleic acids are NOT DETECTED.  The SARS-CoV-2 RNA is generally detectable in upper respiratory specimens during the acute phase of infection. The lowest concentration of SARS-CoV-2 viral copies this assay can detect is 138 copies/mL. A negative result does not preclude SARS-Cov-2 infection and should not be used as the sole basis for treatment or other patient management decisions. A negative result may occur with  improper specimen collection/handling, submission of specimen other than nasopharyngeal swab, presence of viral mutation(s) within the areas targeted by this assay, and inadequate  number  of viral copies(<138 copies/mL). A negative result must be combined with clinical observations, patient history, and epidemiological information. The expected result is Negative.  Fact Sheet for Patients:  EntrepreneurPulse.com.au  Fact Sheet for Healthcare Providers:  IncredibleEmployment.be  This test is no t yet approved or cleared by the [place] FDA and  has been authorized for detection and/or diagnosis of SARS-CoV-2 by FDA under an Emergency Use Authorization (EUA). This EUA will remain  in effect (meaning this test can be used) for the duration of the COVID-19 declaration under Section 564(b)(1) of the Act, 21 U.S.C.section 360bbb-3(b)(1), unless the authorization is terminated  or revoked sooner.       Influenza A by PCR NEGATIVE NEGATIVE Final   Influenza B by PCR NEGATIVE NEGATIVE Final    Comment: (NOTE) The Xpert Xpress SARS-CoV-2/FLU/RSV plus assay is intended as an aid in the diagnosis of influenza from Nasopharyngeal swab specimens and should not be used as a sole basis for treatment. Nasal washings and aspirates are unacceptable for Xpert Xpress SARS-CoV-2/FLU/RSV testing.  Fact Sheet for Patients: EntrepreneurPulse.com.au  Fact Sheet for Healthcare Providers: IncredibleEmployment.be  This test is not yet approved or cleared by the [place] FDA and has been authorized for detection and/or diagnosis of SARS-CoV-2 by FDA under an Emergency Use Authorization (EUA). This EUA will remain in effect (meaning this test can be used) for the duration of the COVID-19 declaration under Section 564(b)(1) of the Act, 21 U.S.C. section 360bbb-3(b)(1), unless the authorization is terminated or revoked.  Performed at [facility], [address] [place], [place] [address]   Culture, blood (Routine X 2) w Reflex to ID Panel     Status: None   Collection Time: 08/13/21  7:31  PM   Specimen: BLOOD  Result Value Ref Range Status   Specimen Description BLOOD [facility]  Final   Special Requests   Final    BOTTLES DRAWN AEROBIC AND ANAEROBIC Blood Culture adequate volume   Culture   Final    NO GROWTH 5 DAYS Performed at [facility], [address], [place], [place] [address]    Report Status 08/18/2021 FINAL  Final  Culture, blood (Routine X 2) w Reflex to ID Panel     Status: None   Collection Time: 08/13/21  7:50 PM   Specimen: BLOOD  Result Value Ref Range Status   Specimen Description BLOOD [facility]  Final   Special Requests   Final    BOTTLES DRAWN AEROBIC AND ANAEROBIC Blood Culture adequate volume   Culture   Final    NO GROWTH 5 DAYS Performed at [facility], [address], [place], [place] [address]    Report Status 08/18/2021 FINAL  Final  Urine Culture     Status: None   Collection Time: 08/15/21 10:35 AM   Specimen: In/Out Cath Urine  Result Value Ref Range Status   Specimen Description   Final    IN/OUT CATH URINE Performed at [facility], [address] [place], [place] [address]    Special Requests   Final    NONE Performed at [facility], [address], [place], [place] [address]    Culture   Final    NO GROWTH Performed at [facility], [place] [address] [place],  [address]    Report Status 08/16/2021 FINAL  Final    Radiology Studies: No results found.  Val Riles, MD [facility]  If 7PM-7AM, please contact night-coverage [email or web address] 08/18/2021,  3:38 PM

## 2021-08-19 NOTE — TOC Progression Note (Signed)
Transition of Care [facility]) - Progression Note    Patient Details  Name: Felicia Sellers MRN: [id number] Date of Birth: Mar 26, 1941  [facility]) CM/SW [place], LCSW Phone Number: 08/19/2021, 10:06 AM  Clinical Narrative:   Checked with [place]. Plan for DC to Hospice Home tomorrow 10/29.          Expected Discharge Plan and Services           Expected Discharge Date: 08/18/21                                     Social Determinants of Health (SDOH) Interventions    Readmission Risk Interventions No flowsheet data found.

## 2021-08-19 NOTE — Progress Notes (Signed)
Having PROGRESS NOTE  Felicia Sellers ZOX:[id number] DOB: 12/02/1940   PCP: [facility]  Patient is from: Home.  Reportedly independent for most ADLs at baseline.  DOA: 08/12/2021 LOS: 7  Chief complaints:  Chief Complaint  Patient presents with   Respiratory Distress    Pt in via EMS from pt's home for SOB. EMS report pt's SPO2 at home on 4l 91%. EMS report pt's family states they can't take care of her anymore. EMS place pt on 6l n/c 96%.     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 fib not on AC, stomach ulcer, CAD, diastolic CHF, lung cancer s/p right lobectomy, tobacco use disorder, alcohol abuse, ovarian mass, chronic hyponatremia, severe malnutrition, HTN, HLD and recurrent hospitalization with respiratory failure returning with shortness of breath and admitted with acute on chronic RF with hypoxia due to COPD and CHF exacerbation.  Initially required BiPAP.  Started on IV Zosyn, IV Lasix, steroid and breathing treatments.  Subjective: No significant overnight events, patient shortness of breath, patient still has cough with phlegm production, feels improvement.  Patient agreed for the hospice care will be transferred tomorrow, does not have bed available today.  Objective: Vitals:   08/19/21 0500 08/19/21 0802 08/19/21 0815 08/19/21 1527  BP: 138/88 (!) 142/86  (!) 144/82  Pulse: 95 90  92  Resp: 20 20  20   Temp: 98 F (36.7 C) 98 F (36.7 C)  98 F (36.7 C)  TempSrc: Oral Oral  Oral  SpO2: 95% 95% 94% 94%  Weight: 50 kg     Height:        Intake/Output Summary (Last 24 hours) at 08/19/2021 1541 Last data filed at 08/19/2021 0700 Gross per 24 hour  Intake 200 ml  Output 300 ml  Net -100 ml   Filed Weights   08/17/21 0500 08/18/21 0617 08/19/21 0500  Weight: 48 kg 50.1 kg 50 kg    Examination:  GENERAL: Frail looking elderly female. In mild respiratory distress HEENT: MMM.  Hard of hearing bilateral  SNHL NECK: Supple.  No apparent JVD.  RESP: rhonchi and rales throughout CVS:  RRR. Heart sounds normal.  ABD/GI/GU: BS+. Abd soft, mild tenderness suprapubically MSK/EXT:  Moves extremities. No apparent deformity. No edema.  SKIN: no apparent skin lesion or wound NEURO: Awake and alert.  PSYCH: Calm. Normal affect.     Procedures:  None  Microbiology summarized: [id number] and influenza PCR nonreactive. Sputum culture pending. Blood culture pending.  Assessment & Plan: Acute on chronic respiratory failure with hypoxia due to combination of COPD, CHF, A. fib, pleural effusion and underlying lung cancer.  Third hospitalization for the same issue in less than 5 weeks.  She was discharged on 10/16 on p.o. Augmentin, Bactrim and 4 L by [place].  Currently saturating at 99% on 4L. Worsening respirations this am. Patient appears to be deteriorating. CT that resulted this afternoon shows reaccumulation of left sided pleural effusion. Previous cultures/cytology neg - cont o2, abx, steroids - abg ordered, may need bipap - palliative consulted - repeat diagnostic/therapeutic thoracentesis was ordered but as per IR, suggested that patient have To mucous plugging in the left lower lobe which is subsequently collapsed, thoracentesis will not help so it was not done.   Urinary retention Symptomatic. Retaining 1100 this morning, s/p I/o cath. Urinary retention persisted throughout the day, most recent pvr 700 ml - s/p Foley catheter, patient pulled out Foley catheter and she  was unable to void so Foley catheter was reinserted.   Acute on chronic diastolic CHF (congestive heart failure): TTE in 06/2021 with LVEF of 55 to 60% (from 70 to 75% in 2021) and moderate TVR. 07/09/2021 showed EF of 55 to 60%.  She had edema, JVD, rhonchi and rales on exam.  CXR with pulmonary edema and increased pleural effusion.  BNP elevated to 770.  Started on IV Lasix.  She had 2.1 L UOP/24 hours.   -Continue IV Lasix 40 mg  twice daily -Discontinued amlodipine and lisinopril to allow room for diuresis. -Monitor fluid status, renal functions and electrolytes. -Sodium and fluid restrictions    COPD exacerbation: Sputum culture grew Acinetobacter and moderate diphtheroid prior hospitalization.  She was discharged on Augmentin and Bactrim.  Remains rhonchorous on exam.  Has leukocytosis but Pro-Cal negative.  Ongoing tobacco use.  -Discontinued Augmentin and Bactrim -Changed since Zosyn to IV cefepime and azithromycin -Continue IV Solu-Medrol and bronchodilators -Continue mucolytic's/antitussive/IS -OOB/PT/OT -Titrate oxygen for saturation above 90% - sputum cultures NGTD  Persistent A. fib with RVR: RVR resolved.  Not on anticoagulation likely due to history of stomach ulcer.  On Toprol-XL at home. -Metoprolol 25 mg twice daily, increase to 50  Acute metabolic encephalopathy: Resolved.   -Reorientation delirium precautions. -Minimize or avoid sedating medications  Dysphagia: Likely in the setting of encephalopathy. -Continue dysphagia 3 diet -SLP eval   Sepsis ruled out.    Essential hypertension: Normotensive. -IV Lasix and metoprolol as above -Discontinued amlodipine and lisinopril   Tobacco use disorder: Reportedly smokes about a pack a day. -Cessation counseling when able to comprehend. -Continue nicotine patch  Alcohol abuse-Per recent discharge summary, sober over the last 1 month. Has had intermittent ativan here but no clear signs of withdrawal. -Continue CIWA, multivitamin, folic acid and thiamine   Personal history of lung cancer -s/p of right lobectomy, 1/3 of right lung   Chronic hyponatremia: Improved.  Could be due to CHF and Bactrim. -Continue IV Lasix -Fluid and sodium restriction   Elevated troponin and history of CAD: Troponin is minimally elevated 19 > 23 > 18.  Likely demand ischemia -Cardiac meds as above -Continue home statin.  Hypokalemia:  Potassium repleted,  monitor and replete as needed.   Physical deconditioning -PT/OT recommended SNF.   Goal of care counseling-appropriately DNR/DNI.  A lot of comorbidity.  Poor long-term prognosis. I am concerned patient is nearing death. -Palliative medicine consulted on admission Discussion was done with patient's family who made the decision for hospice care, patient has no capacity to make the decision.  Awaiting for hospice bed availability   Severe malnutrition: As evidenced by low BMI and significant muscle mass and subcu fat loss. Body mass index is 19.53 kg/m. Nutrition Problem: Severe Malnutrition Etiology: chronic illness (COPD, CHF, lung cancer, etoh abuse)-Consult dietitian Signs/Symptoms: severe fat depletion, severe muscle depletion   Pressure Injury 08/18/21 Sacrum Medial Stage 2 -  Partial thickness loss of dermis presenting as a shallow open injury with a red, pink wound bed without slough. (Active)  08/18/21 2100  Location: Sacrum  Location Orientation: Medial  Staging: Stage 2 -  Partial thickness loss of dermis presenting as a shallow open injury with a red, pink wound bed without slough.  Wound Description (Comments):   Present on Admission: No   DVT prophylaxis:  heparin injection 5,000 Units Start: 08/13/21 1400  Code Status: DNR/DNI Family Communication: nephew who is next of kin updated today on patient's tenuous status Level of care:  Med-Surg Status is: Inpatient  Remains inpatient appropriate because: IV medication and need for palliative medicine safe disposition Patient will be discharged to hospice care when bed will be available   Consultants:  Palliative medicine   Sch Meds:  Scheduled Meds:  atorvastatin  40 mg Oral QHS   calcium carbonate  1 tablet Oral BID WC   Chlorhexidine Gluconate Cloth  6 each Topical Daily   cholecalciferol  1,000 Units Oral Daily   docusate sodium  100 mg Oral Daily   feeding supplement (NEPRO CARB STEADY)  237 mL Oral TID  BM   folic acid  1 mg Oral Daily   gabapentin  100 mg Oral TID   guaiFENesin  600 mg Oral BID   heparin  5,000 Units Subcutaneous Q8H   ipratropium-albuterol  3 mL Nebulization Q6H   methylPREDNISolone (SOLU-MEDROL) injection  40 mg Intravenous Q12H   metoprolol tartrate  50 mg Oral BID   multivitamin with minerals  1 tablet Oral Daily   nicotine  21 mg Transdermal Daily   thiamine  100 mg Oral Daily   Or   thiamine  100 mg Intravenous Daily   Continuous Infusions:  sodium chloride 10 mL/hr at 08/19/21 1031   ceFEPime (MAXIPIME) IV 2 g (08/19/21 1039)   PRN Meds:.sodium chloride, acetaminophen, albuterol, dextromethorphan-guaiFENesin, diclofenac Sodium, loperamide, morphine injection, ondansetron (ZOFRAN) IV  Antimicrobials: Anti-infectives (From admission, onward)    Start     Dose/Rate Route Frequency Ordered Stop   08/18/21 0000  amoxicillin-clavulanate (AUGMENTIN) 500-125 MG tablet        1 tablet Oral Every 8 hours 08/18/21 1157 08/23/21 2359   08/13/21 1400  amoxicillin-clavulanate (AUGMENTIN) 500-125 MG per tablet 500 mg  Status:  Discontinued        1 tablet Oral Every 8 hours 08/13/21 1110 08/13/21 1233   08/13/21 1400  azithromycin (ZITHROMAX) 500 mg in sodium chloride 0.9 % 250 mL IVPB        500 mg 250 mL/hr over 60 Minutes Intravenous Every 24 hours 08/13/21 1254 08/15/21 1350   08/13/21 1345  ceFEPIme (MAXIPIME) 2 g in sodium chloride 0.9 % 100 mL IVPB        2 g 200 mL/hr over 30 Minutes Intravenous Every 12 hours 08/13/21 1330 08/19/21 2359   08/13/21 1300  metroNIDAZOLE (FLAGYL) IVPB 500 mg  Status:  Discontinued        500 mg 100 mL/hr over 60 Minutes Intravenous Every 12 hours 08/13/21 1254 08/13/21 1254   08/13/21 1115  sulfamethoxazole-trimethoprim (BACTRIM DS) 800-160 MG per tablet 1 tablet  Status:  Discontinued        1 tablet Oral Every 12 hours 08/13/21 1110 08/13/21 1253   08/12/21 1430  piperacillin-tazobactam (ZOSYN) IVPB 3.375 g  Status:   Discontinued        3.375 g 12.5 mL/hr over 240 Minutes Intravenous Every 8 hours 08/12/21 1406 08/13/21 1254        I have personally reviewed the following labs and images: CBC: Recent Labs  Lab 08/14/21 0624 08/15/21 0444 08/16/21 0529 08/17/21 0350 08/18/21 0506 08/19/21 0507  WBC 9.6 14.0* 13.9* 17.1* 22.1* 20.8*  NEUTROABS 7.1  --   --   --   --   --   HGB 14.4 15.3* 14.5 13.6 13.9 15.0  HCT 42.1 43.0 43.5 39.1 38.7 41.2  MCV 92.7 93.3 94.4 93.8 91.7 91.6  PLT 281 266 226 225 208 215   BMP &GFR Recent Labs  Lab  08/14/21 0814 08/15/21 0444 08/16/21 0529 08/17/21 0350 08/18/21 0506 08/19/21 0507  NA 132* 134* 135 132* 126* 125*  K 3.7 3.5 3.4* 2.9* 3.3* 4.1  CL 86* 83* 85* 82* 80* 82*  CO2 35* 33* 37* 37* 37* 34*  GLUCOSE 96 124* 120* 156* 112* 105*  BUN 19 29* 29* 35* 32* 31*  CREATININE 0.60 0.78 0.61 0.63 0.61 0.65  CALCIUM 8.4* 8.8* 9.1 9.1 8.9 9.0  MG 1.8 2.2  --  1.7 1.6* 2.2  PHOS 4.2 4.0  --  2.6 2.7 3.2   Estimated Creatinine Clearance: 42.8 mL/min (by C-G formula based on SCr of 0.65 mg/dL). Liver & Pancreas: Recent Labs  Lab 08/14/21 0624 08/15/21 0444 08/16/21 0529  AST  --   --  14*  ALT  --   --  21  ALKPHOS  --   --  85  BILITOT  --   --  1.4*  PROT  --   --  6.0*  ALBUMIN 3.4* 3.8 3.4*   No results for input(s): LIPASE, AMYLASE in the last 168 hours. Recent Labs  Lab 08/14/21 0624  AMMONIA 17   Diabetic: No results for input(s): HGBA1C in the last 72 hours.  No results for input(s): GLUCAP in the last 168 hours.  Cardiac Enzymes: No results for input(s): CKTOTAL, CKMB, CKMBINDEX, TROPONINI in the last 168 hours. No results for input(s): PROBNP in the last 8760 hours. Coagulation Profile: No results for input(s): INR, PROTIME in the last 168 hours. Thyroid Function Tests: No results for input(s): TSH, T4TOTAL, FREET4, T3FREE, THYROIDAB in the last 72 hours.  Lipid Profile: No results for input(s): CHOL, HDL, LDLCALC,  TRIG, CHOLHDL, LDLDIRECT in the last 72 hours.  Anemia Panel: No results for input(s): VITAMINB12, FOLATE, FERRITIN, TIBC, IRON, RETICCTPCT in the last 72 hours. Urine analysis:    Component Value Date/Time   COLORURINE STRAW (A) 08/15/2021 1055   APPEARANCEUR CLEAR (A) 08/15/2021 1055   APPEARANCEUR Clear 05/28/2014 0855   LABSPEC 1.008 08/15/2021 1055   LABSPEC 1.013 05/28/2014 0855   PHURINE 6.0 08/15/2021 1055   GLUCOSEU NEGATIVE 08/15/2021 1055   GLUCOSEU Negative 05/28/2014 0855   HGBUR NEGATIVE 08/15/2021 1055   BILIRUBINUR NEGATIVE 08/15/2021 1055   BILIRUBINUR Negative 05/28/2014 0855   KETONESUR NEGATIVE 08/15/2021 1055   PROTEINUR NEGATIVE 08/15/2021 1055   NITRITE NEGATIVE 08/15/2021 1055   LEUKOCYTESUR NEGATIVE 08/15/2021 1055   LEUKOCYTESUR Negative 05/28/2014 0855   Sepsis Labs: Invalid input(s): PROCALCITONIN, [place]  Microbiology: Recent Results (from the past 240 hour(s))  Resp Panel by RT-PCR (Flu A&B, Covid) Nasopharyngeal Swab     Status: None   Collection Time: 08/12/21  6:25 AM   Specimen: Nasopharyngeal Swab; Nasopharyngeal(NP) swabs in vial transport medium  Result Value Ref Range Status   SARS Coronavirus 2 by RT PCR NEGATIVE NEGATIVE Final    Comment: (NOTE) SARS-CoV-2 target nucleic acids are NOT DETECTED.  The SARS-CoV-2 RNA is generally detectable in upper respiratory specimens during the acute phase of infection. The lowest concentration of SARS-CoV-2 viral copies this assay can detect is 138 copies/mL. A negative result does not preclude SARS-Cov-2 infection and should not be used as the sole basis for treatment or other patient management decisions. A negative result may occur with  improper specimen collection/handling, submission of specimen other than nasopharyngeal swab, presence of viral mutation(s) within the areas targeted by this assay, and inadequate number of viral copies(<138 copies/mL). A negative result must be combined  with clinical  observations, patient history, and epidemiological information. The expected result is Negative.  Fact Sheet for Patients:  EntrepreneurPulse.com.au  Fact Sheet for Healthcare Providers:  IncredibleEmployment.be  This test is no t yet approved or cleared by the [place] FDA and  has been authorized for detection and/or diagnosis of SARS-CoV-2 by FDA under an Emergency Use Authorization (EUA). This EUA will remain  in effect (meaning this test can be used) for the duration of the COVID-19 declaration under Section 564(b)(1) of the Act, 21 U.S.C.section 360bbb-3(b)(1), unless the authorization is terminated  or revoked sooner.       Influenza A by PCR NEGATIVE NEGATIVE Final   Influenza B by PCR NEGATIVE NEGATIVE Final    Comment: (NOTE) The Xpert Xpress SARS-CoV-2/FLU/RSV plus assay is intended as an aid in the diagnosis of influenza from Nasopharyngeal swab specimens and should not be used as a sole basis for treatment. Nasal washings and aspirates are unacceptable for Xpert Xpress SARS-CoV-2/FLU/RSV testing.  Fact Sheet for Patients: EntrepreneurPulse.com.au  Fact Sheet for Healthcare Providers: IncredibleEmployment.be  This test is not yet approved or cleared by the [place] FDA and has been authorized for detection and/or diagnosis of SARS-CoV-2 by FDA under an Emergency Use Authorization (EUA). This EUA will remain in effect (meaning this test can be used) for the duration of the COVID-19 declaration under Section 564(b)(1) of the Act, 21 U.S.C. section 360bbb-3(b)(1), unless the authorization is terminated or revoked.  Performed at [facility], [address] [place], [place] [address]   Culture, blood (Routine X 2) w Reflex to ID Panel     Status: None   Collection Time: 08/13/21  7:31 PM   Specimen: BLOOD  Result Value Ref Range Status   Specimen  Description BLOOD [facility]  Final   Special Requests   Final    BOTTLES DRAWN AEROBIC AND ANAEROBIC Blood Culture adequate volume   Culture   Final    NO GROWTH 5 DAYS Performed at [facility], [address], [place], [place] [address]    Report Status 08/18/2021 FINAL  Final  Culture, blood (Routine X 2) w Reflex to ID Panel     Status: None   Collection Time: 08/13/21  7:50 PM   Specimen: BLOOD  Result Value Ref Range Status   Specimen Description BLOOD [facility]  Final   Special Requests   Final    BOTTLES DRAWN AEROBIC AND ANAEROBIC Blood Culture adequate volume   Culture   Final    NO GROWTH 5 DAYS Performed at [facility], [address], [place], [place] [address]    Report Status 08/18/2021 FINAL  Final  Urine Culture     Status: None   Collection Time: 08/15/21 10:35 AM   Specimen: In/Out Cath Urine  Result Value Ref Range Status   Specimen Description   Final    IN/OUT CATH URINE Performed at [facility], [address] [place], [place] [address]    Special Requests   Final    NONE Performed at [facility], [address] [place], [place] [address]    Culture   Final    NO GROWTH Performed at [facility], [address], [place], [place] [address]    Report Status 08/16/2021 FINAL  Final    Radiology Studies: No results found.  Val Riles, MD [facility]  If 7PM-7AM, please contact night-coverage [email or web address] 08/18/2021, 3:41 PM

## 2021-08-20 LAB — CBC
HCT: 38.7 % (ref 36.0–46.0)
Hemoglobin: 13.5 g/dL (ref 12.0–15.0)
MCH: 31.8 pg (ref 26.0–34.0)
MCHC: 34.9 g/dL (ref 30.0–36.0)
MCV: 91.1 fL (ref 80.0–100.0)
Platelets: 206 10*3/uL (ref 150–400)
RBC: 4.25 MIL/uL (ref 3.87–5.11)
RDW: 14.6 % (ref 11.5–15.5)
WBC: 21.7 10*3/uL — ABNORMAL HIGH (ref 4.0–10.5)
nRBC: 0 % (ref 0.0–0.2)

## 2021-08-20 LAB — BASIC METABOLIC PANEL
Anion gap: 5 (ref 5–15)
BUN: 29 mg/dL — ABNORMAL HIGH (ref 8–23)
CO2: 34 mmol/L — ABNORMAL HIGH (ref 22–32)
Calcium: 8.7 mg/dL — ABNORMAL LOW (ref 8.9–10.3)
Chloride: 89 mmol/L — ABNORMAL LOW (ref 98–111)
Creatinine, Ser: 0.56 mg/dL (ref 0.44–1.00)
GFR, Estimated: >60 mL/min (ref >60)
Glucose, Bld: 99 mg/dL (ref 70–99)
Potassium: 3.6 mmol/L (ref 3.5–5.1)
Sodium: 128 mmol/L — ABNORMAL LOW (ref 135–145)

## 2021-08-20 NOTE — TOC Transition Note (Addendum)
Transition of Care [facility]) - CM/SW Discharge Note   Patient Details  Name: Felicia Sellers MRN: [id number] Date of Birth: [birth year]  [facility] CM/SW Contact:  Felicia Ivan, LCSW Phone Number: 08/20/2021, 9:18 AM   Clinical Narrative:   Patient to discharge to [place] today. Confirmed with [place], SW at [facility]. [facility] and requested pick up around 12 noon per [facility]'s request. Patient has been added to EMS list for 12 pick up (based on truck availability).  CSW updated patient's nephew Felicia Sellers via phone. Asked RN to call report.  Faxed DC Summary to [place].     Final next level of care: [place] Barriers to Discharge: Barriers Resolved   Patient Goals and CMS Choice Patient states their goals for this hospitalization and ongoing recovery are:: hospice home CMS Medicare.gov Compare Post Acute Care list provided to:: Patient Represenative (must comment)    Discharge Placement                Patient to be transferred to facility by: [facility] Name of family member notified: Felicia Sellers - nephew Patient and family notified of of transfer: 08/20/21  Discharge Plan and Services                                     Social Determinants of Health (SDOH) Interventions     Readmission Risk Interventions No flowsheet data found.

## 2021-08-22 ENCOUNTER — Ambulatory Visit: Payer: Medicare Other | Admitting: Family

## 2021-08-25 LAB — ACID FAST CULTURE WITH REFLEXED SENSITIVITIES (MYCOBACTERIA): Acid Fast Culture: NEGATIVE

## 2021-09-22 ENCOUNTER — Ambulatory Visit: Payer: Medicaid Other

## 2021-09-22 DEATH — deceased

## 2021-09-26 ENCOUNTER — Inpatient Hospital Stay: Admitting: Oncology

## 2021-11-14 ENCOUNTER — Ambulatory Visit: Payer: Medicare Other | Admitting: Family

## 2022-05-15 IMAGING — DX DG CHEST 1V PORT
1 series · 1 of 1 positions shown · non-contrast
Comparison: 08/08/2021

CLINICAL DATA: Acute onset short of breath

EXAM:
PORTABLE CHEST 1 VIEW

[chest ap]
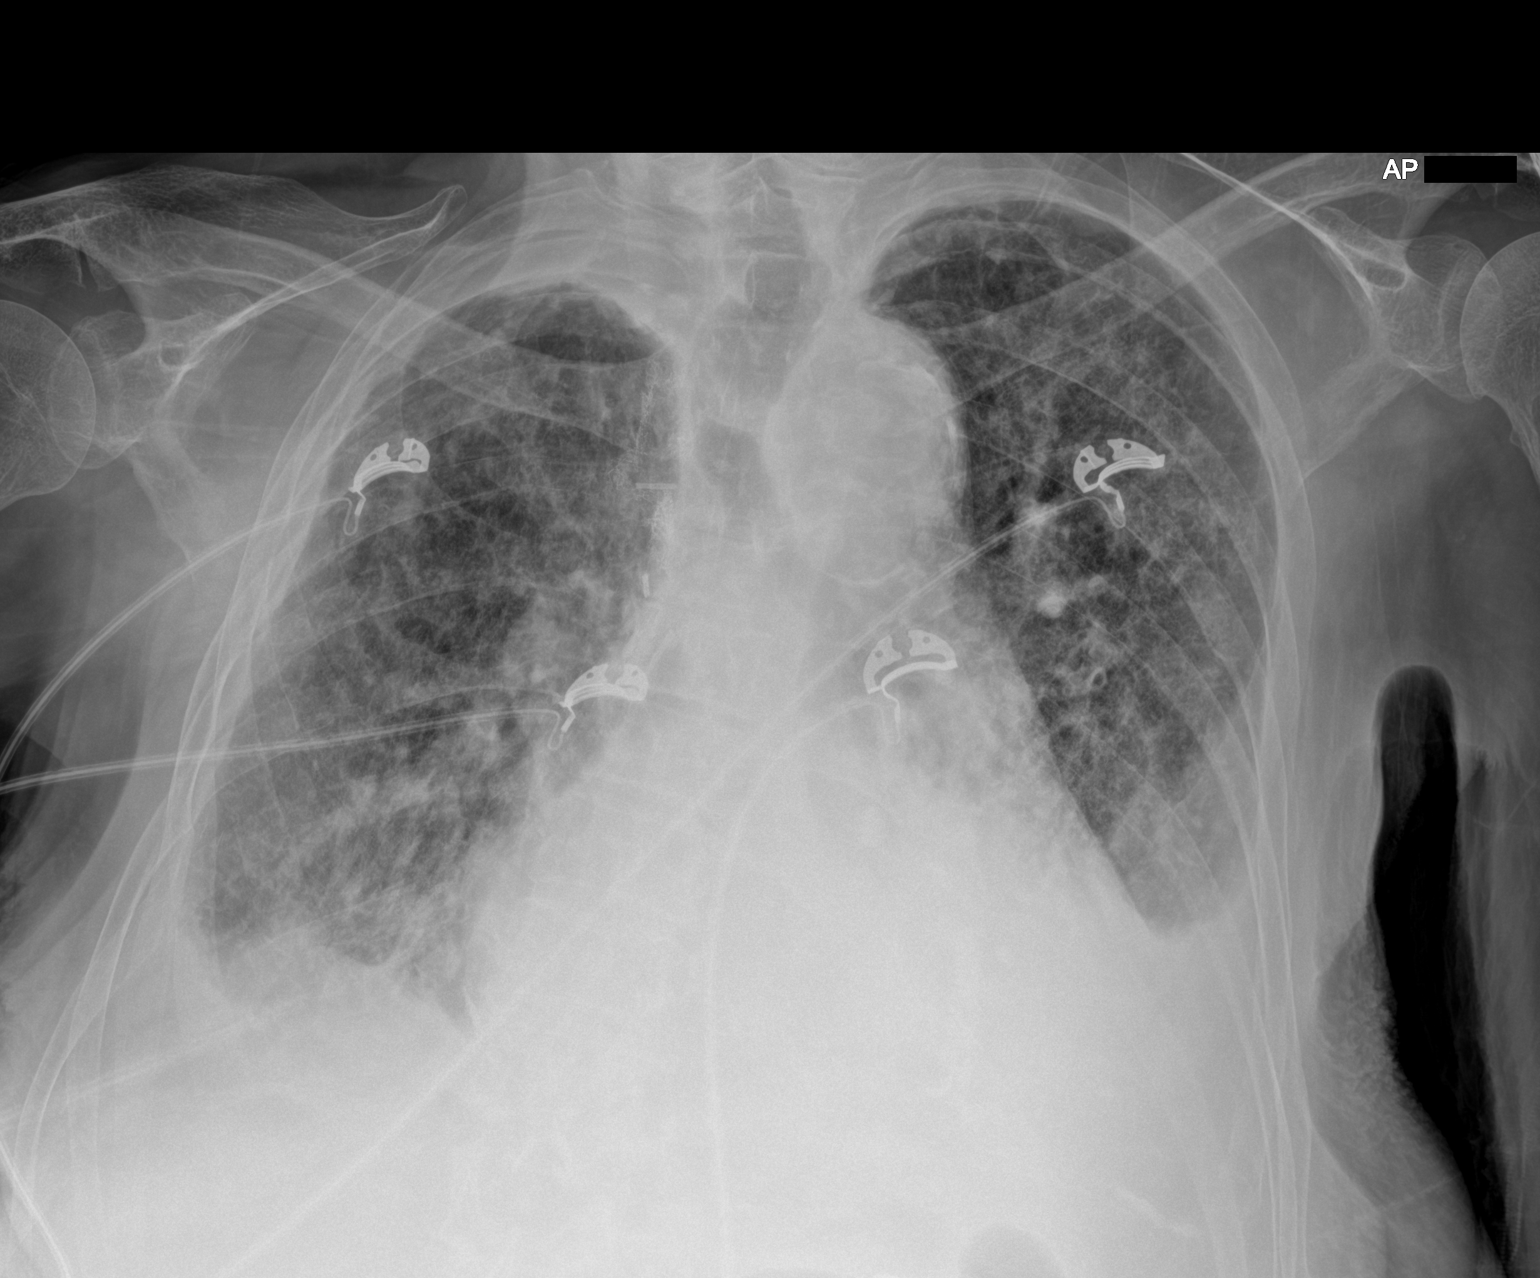

[1 of 1 positions shown; findings below may reference images not displayed]

FINDINGS: Stable cardiac silhouette. Interval increase in bilateral pleural
effusions. Mild interstitial edema pattern also increased. No
pneumothorax. No focal consolidation.
IMPRESSION: Increasing pleural effusions and interstitial edema.

## 2022-05-18 IMAGING — CT CT CHEST W/O CM
2 of 3 series · 15 of 36 positions shown, 18 images · non-contrast
Comparison: August 07, 2021.

CLINICAL DATA: Respiratory failure.

EXAM:
CT CHEST WITHOUT CONTRAST
TECHNIQUE: Multidetector CT imaging of the chest was performed following the
standard protocol without IV contrast.

[Series 5: coronal · coronal · 0.57mm/px · 3 of 130 slices shown]
[im 26/130  lung]
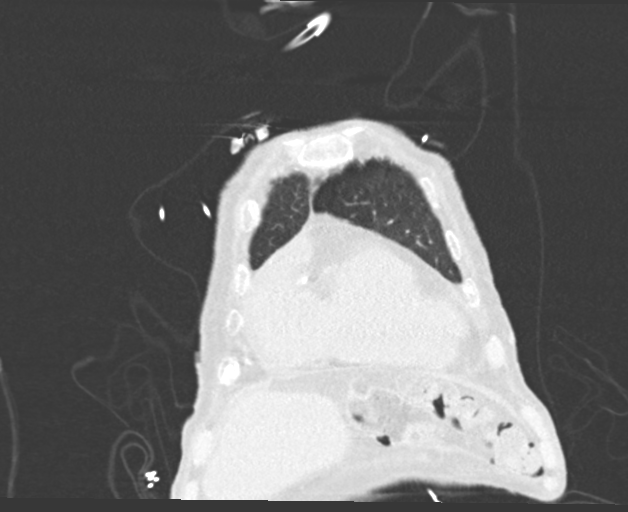
[im 52/130  lung]
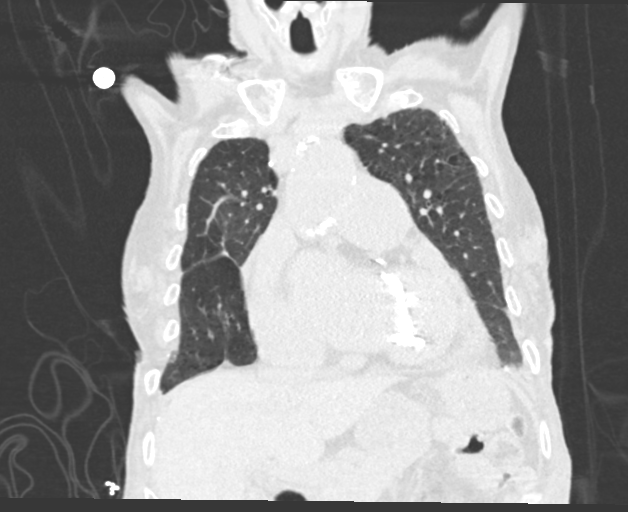
[im 78/130  lung]
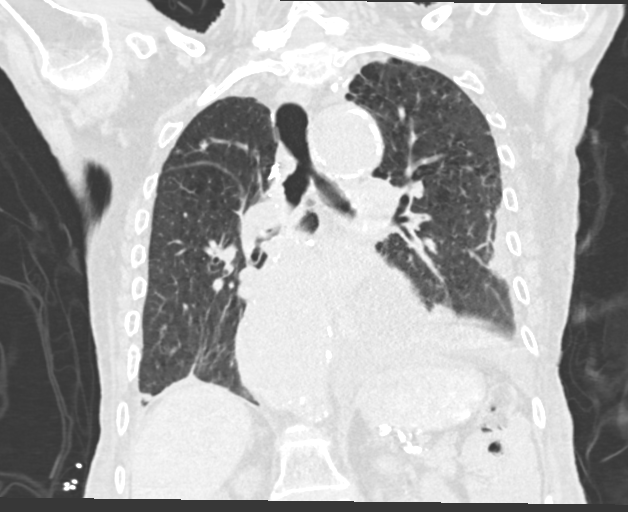

[Series 7: thorax recon · axial · 0.55mm/px · z∈[-320,-78]mm · 12 of 143 slices shown, 15 images]
[im 11/143  mediastinal]
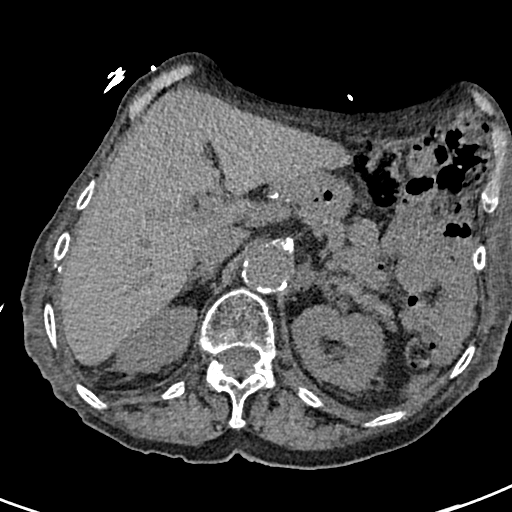
[im 11/143  lung]
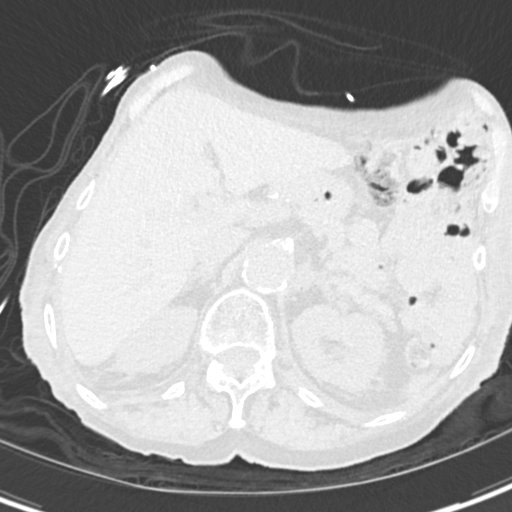
[im 22/143  lung]
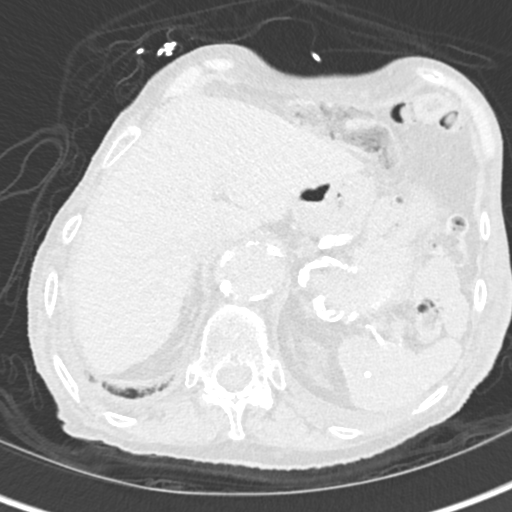
[im 32/143  lung]
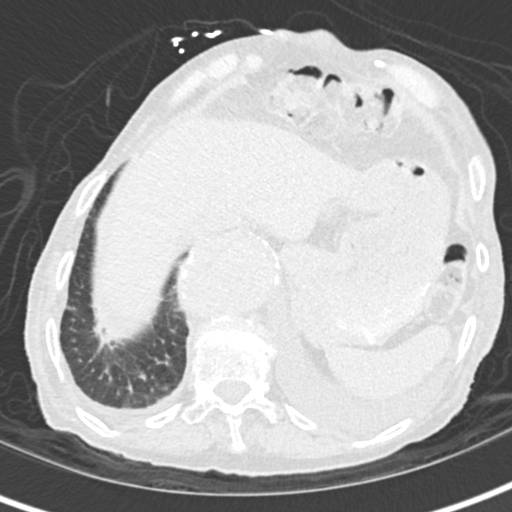
[im 43/143  lung]
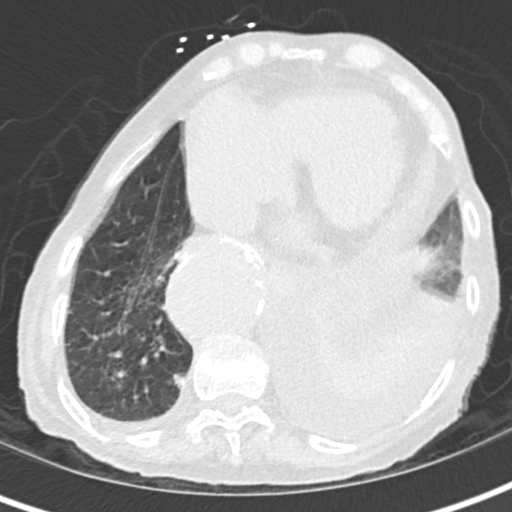
[im 53/143  mediastinal]
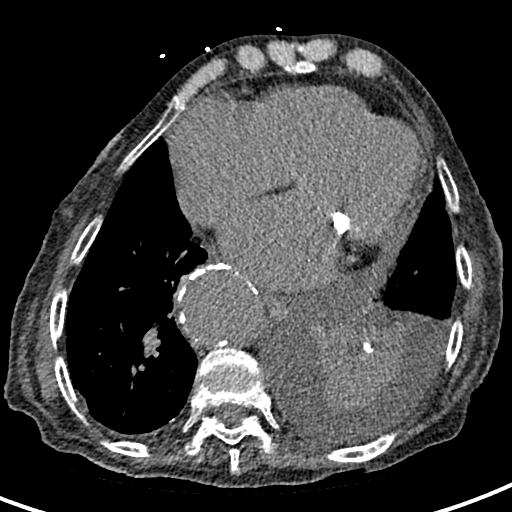
[im 53/143  lung]
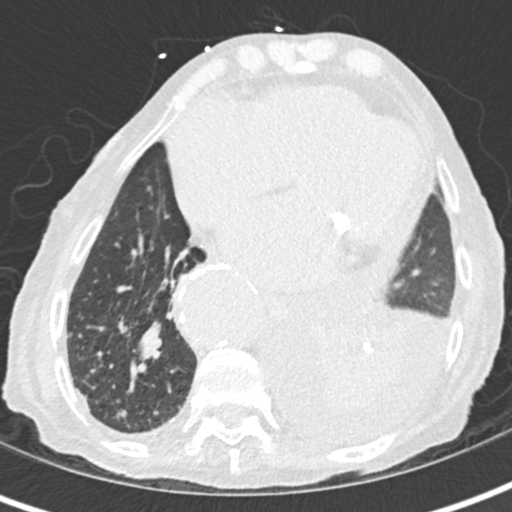
[im 64/143  lung]
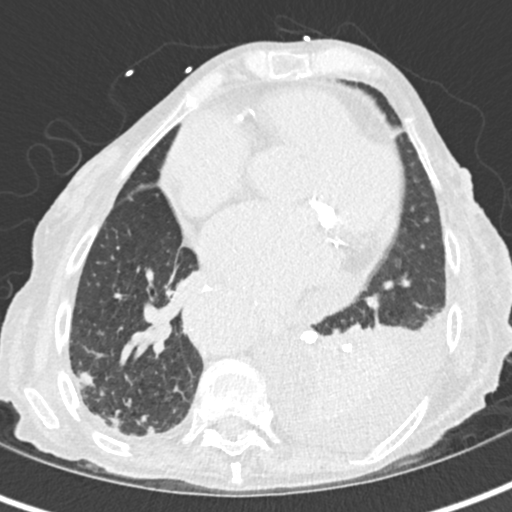
[im 79/143  lung]
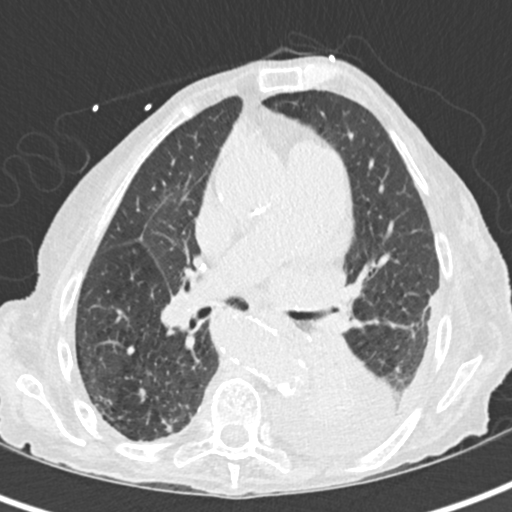
[im 90/143  lung]
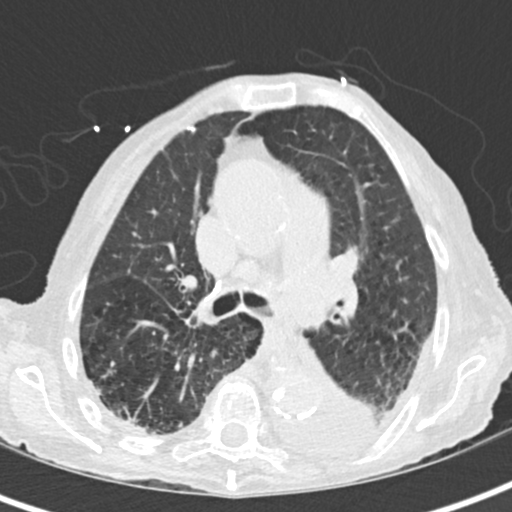
[im 100/143  mediastinal]
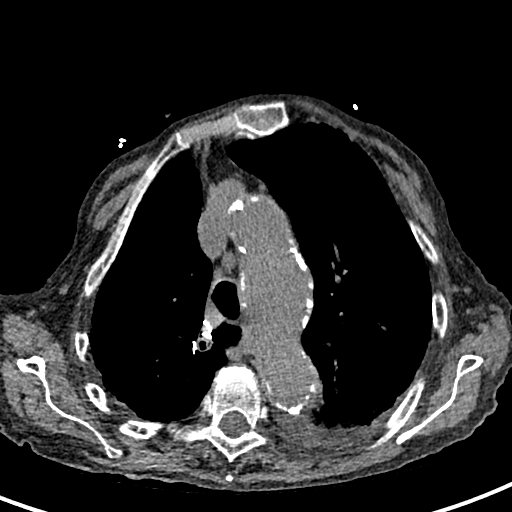
[im 100/143  lung]
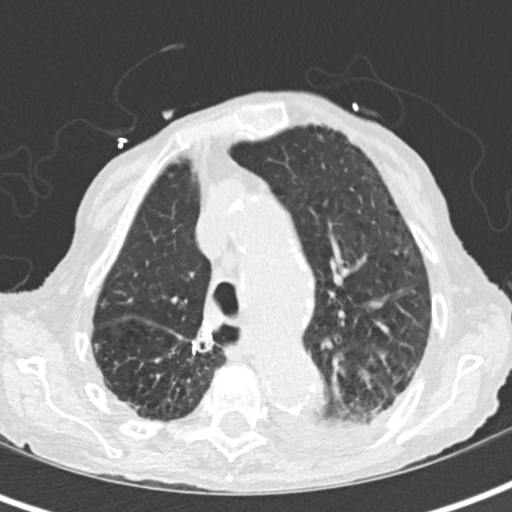
[im 111/143  lung]
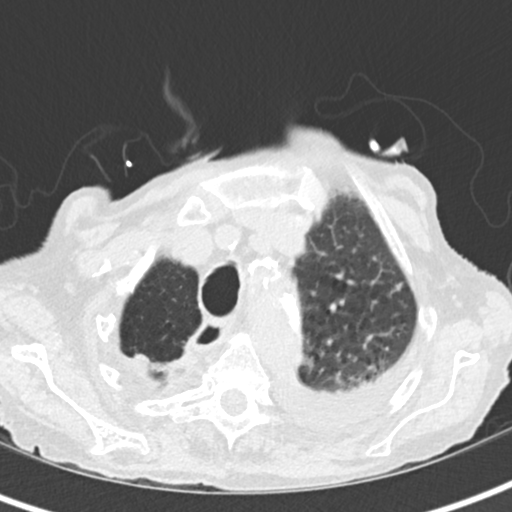
[im 121/143  lung]
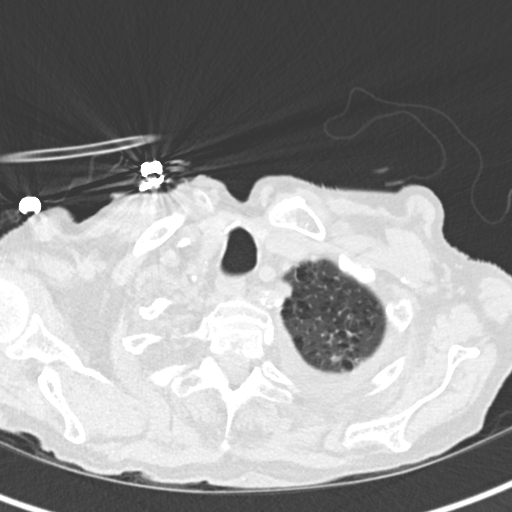
[im 132/143  lung]
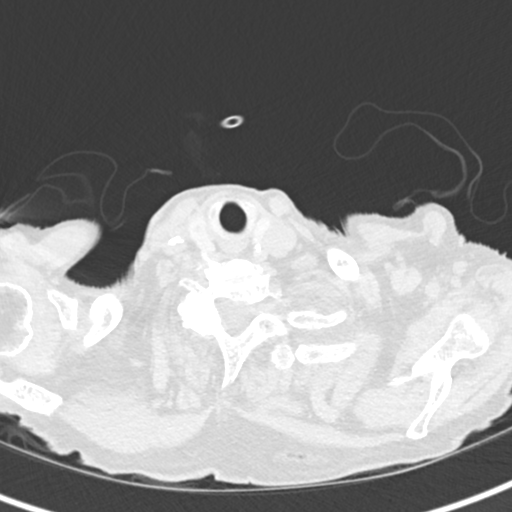

[15 of 36 positions shown; findings below may reference images not displayed]

FINDINGS: Cardiovascular: Mild cardiomegaly is noted without pericardial
effusion. Coronary artery calcifications are again noted.
Atherosclerosis of thoracic aorta is noted. 5.4 cm aneurysm is seen
involving the distal descending thoracic aorta.

Mediastinum/Nodes: No enlarged mediastinal or axillary lymph nodes.
Thyroid gland, trachea, and esophagus demonstrate no significant
findings.

Lungs/Pleura: No pneumothorax is noted. Moderate size left pleural
effusion is noted with associated atelectasis of left lower lobe.
Minimal right basilar subsegmental atelectasis is noted.

Upper Abdomen: No acute abnormality.

Musculoskeletal: No chest wall mass or suspicious bone lesions
identified.
IMPRESSION: Moderate size left pleural effusion is noted with associated
atelectasis of the left lower lobe.

Grossly stable 5.4 cm aneurysm involving distal portion of
descending thoracic aorta.

Coronary artery calcifications are noted suggesting coronary artery
disease.

Minimal right posterior basilar subsegmental atelectasis is noted.

Aortic Atherosclerosis ([id number]-JW0.0) and Emphysema ([id number]-X77.5).

## 2022-08-30 NOTE — Telephone Encounter (Signed)
Error

## 2022-09-22 LAB — HISTOPLASMA GAL'MANNAN AG SER: Histoplasma Gal'mannan Ag Ser: <0.5 (ref <0.5)
